# Patient Record
Sex: Female | Born: 1950
Health system: Southern US, Community
[De-identification: ages and names within clinical notes are randomized; demographics above are authoritative.]

## PROBLEM LIST (undated history)

## (undated) ENCOUNTER — Emergency Department (HOSPITAL_COMMUNITY): Disposition: A | Discharge status: Home / Self Care | Payer: Federal, State, Local not specified - PPO

## (undated) DIAGNOSIS — F32A Depression, unspecified: Secondary | ICD-10-CM

## (undated) DIAGNOSIS — F419 Anxiety disorder, unspecified: Secondary | ICD-10-CM

## (undated) DIAGNOSIS — M1990 Unspecified osteoarthritis, unspecified site: Secondary | ICD-10-CM

## (undated) DIAGNOSIS — N879 Dysplasia of cervix uteri, unspecified: Secondary | ICD-10-CM

## (undated) DIAGNOSIS — G9340 Encephalopathy, unspecified: Secondary | ICD-10-CM

## (undated) DIAGNOSIS — F329 Major depressive disorder, single episode, unspecified: Secondary | ICD-10-CM

## (undated) DIAGNOSIS — G35 Multiple sclerosis: Secondary | ICD-10-CM

## (undated) DIAGNOSIS — H3530 Unspecified macular degeneration: Secondary | ICD-10-CM

## (undated) DIAGNOSIS — E7800 Pure hypercholesterolemia, unspecified: Secondary | ICD-10-CM

## (undated) DIAGNOSIS — D649 Anemia, unspecified: Secondary | ICD-10-CM

## (undated) DIAGNOSIS — I10 Essential (primary) hypertension: Secondary | ICD-10-CM

## (undated) HISTORY — PX: CHOLECYSTECTOMY: SHX55

## (undated) HISTORY — DX: Depression, unspecified: F32.A

## (undated) HISTORY — DX: Anxiety disorder, unspecified: F41.9

## (undated) HISTORY — PX: TONSILLECTOMY: SUR1361

## (undated) HISTORY — DX: Essential (primary) hypertension: I10

## (undated) HISTORY — DX: Pure hypercholesterolemia, unspecified: E78.00

## (undated) HISTORY — DX: Major depressive disorder, single episode, unspecified: F32.9

## (undated) HISTORY — DX: Unspecified osteoarthritis, unspecified site: M19.90

## (undated) HISTORY — DX: Unspecified macular degeneration: H35.30

## (undated) HISTORY — PX: TUBAL LIGATION: SHX77

## (undated) HISTORY — PX: COLONOSCOPY W/ POLYPECTOMY: SHX1380

## (undated) HISTORY — PX: COLPOSCOPY: SHX161

## (undated) HISTORY — PX: KNEE SURGERY: SHX244

## (undated) HISTORY — DX: Encephalopathy, unspecified: G93.40

## (undated) HISTORY — DX: Dysplasia of cervix uteri, unspecified: N87.9

---

## 1999-10-06 DIAGNOSIS — H3530 Unspecified macular degeneration: Secondary | ICD-10-CM

## 1999-10-06 HISTORY — DX: Unspecified macular degeneration: H35.30

## 2005-11-23 ENCOUNTER — Other Ambulatory Visit: Admission: RE | Admit: 2005-11-23 | Discharge: 2005-11-23 | Payer: Self-pay | Admitting: Obstetrics and Gynecology

## 2006-08-10 ENCOUNTER — Emergency Department (HOSPITAL_COMMUNITY): Admission: EM | Admit: 2006-08-10 | Discharge: 2006-08-10 | Payer: Self-pay | Admitting: Family Medicine

## 2007-04-11 ENCOUNTER — Emergency Department (HOSPITAL_COMMUNITY): Admission: EM | Admit: 2007-04-11 | Discharge: 2007-04-11 | Payer: Self-pay | Admitting: Emergency Medicine

## 2007-09-23 ENCOUNTER — Emergency Department (HOSPITAL_COMMUNITY): Admission: EM | Admit: 2007-09-23 | Discharge: 2007-09-23 | Payer: Self-pay | Admitting: Family Medicine

## 2007-09-25 ENCOUNTER — Emergency Department (HOSPITAL_COMMUNITY): Admission: EM | Admit: 2007-09-25 | Discharge: 2007-09-25 | Payer: Self-pay | Admitting: Emergency Medicine

## 2007-11-02 ENCOUNTER — Other Ambulatory Visit: Admission: RE | Admit: 2007-11-02 | Discharge: 2007-11-02 | Payer: Self-pay | Admitting: Family Medicine

## 2007-11-15 ENCOUNTER — Encounter: Admission: RE | Admit: 2007-11-15 | Discharge: 2007-11-15 | Payer: Self-pay | Admitting: Family Medicine

## 2007-12-02 ENCOUNTER — Ambulatory Visit: Payer: Self-pay | Admitting: Gastroenterology

## 2008-01-04 ENCOUNTER — Ambulatory Visit: Payer: Self-pay | Admitting: Gastroenterology

## 2008-11-27 ENCOUNTER — Encounter: Admission: RE | Admit: 2008-11-27 | Discharge: 2008-11-27 | Payer: Self-pay | Admitting: Internal Medicine

## 2008-11-27 ENCOUNTER — Other Ambulatory Visit: Admission: RE | Admit: 2008-11-27 | Discharge: 2008-11-27 | Payer: Self-pay | Admitting: Internal Medicine

## 2009-01-30 ENCOUNTER — Encounter: Admission: RE | Admit: 2009-01-30 | Discharge: 2009-01-30 | Payer: Self-pay | Admitting: Internal Medicine

## 2010-05-13 ENCOUNTER — Encounter: Admission: RE | Admit: 2010-05-13 | Discharge: 2010-05-13 | Payer: Self-pay | Admitting: Internal Medicine

## 2011-04-21 ENCOUNTER — Emergency Department (HOSPITAL_COMMUNITY): Payer: Federal, State, Local not specified - PPO

## 2011-04-21 ENCOUNTER — Other Ambulatory Visit (INDEPENDENT_AMBULATORY_CARE_PROVIDER_SITE_OTHER): Payer: Self-pay | Admitting: Surgery

## 2011-04-21 ENCOUNTER — Ambulatory Visit (HOSPITAL_COMMUNITY)
Admission: EM | Admit: 2011-04-21 | Discharge: 2011-04-22 | Disposition: A | Discharge status: Home / Self Care | Payer: Federal, State, Local not specified - PPO | Attending: Surgery | Admitting: Surgery

## 2011-04-21 DIAGNOSIS — I10 Essential (primary) hypertension: Secondary | ICD-10-CM | POA: Insufficient documentation

## 2011-04-21 DIAGNOSIS — K8010 Calculus of gallbladder with chronic cholecystitis without obstruction: Secondary | ICD-10-CM

## 2011-04-21 DIAGNOSIS — K8000 Calculus of gallbladder with acute cholecystitis without obstruction: Secondary | ICD-10-CM | POA: Insufficient documentation

## 2011-04-21 DIAGNOSIS — Z01810 Encounter for preprocedural cardiovascular examination: Secondary | ICD-10-CM | POA: Insufficient documentation

## 2011-04-21 DIAGNOSIS — M1990 Unspecified osteoarthritis, unspecified site: Secondary | ICD-10-CM | POA: Insufficient documentation

## 2011-04-21 DIAGNOSIS — Z01812 Encounter for preprocedural laboratory examination: Secondary | ICD-10-CM | POA: Insufficient documentation

## 2011-04-21 DIAGNOSIS — R1011 Right upper quadrant pain: Secondary | ICD-10-CM

## 2011-04-21 DIAGNOSIS — E785 Hyperlipidemia, unspecified: Secondary | ICD-10-CM | POA: Insufficient documentation

## 2011-04-21 DIAGNOSIS — R112 Nausea with vomiting, unspecified: Secondary | ICD-10-CM

## 2011-04-21 LAB — DIFFERENTIAL
Basophils Absolute: 0 10*3/uL (ref 0.0–0.1)
Eosinophils Absolute: 0 10*3/uL (ref 0.0–0.7)
Eosinophils Relative: 0 % (ref 0–5)
Lymphs Abs: 1.3 10*3/uL (ref 0.7–4.0)
Neutrophils Relative %: 78 % — ABNORMAL HIGH (ref 43–77)

## 2011-04-21 LAB — URINALYSIS, ROUTINE W REFLEX MICROSCOPIC
Bilirubin Urine: NEGATIVE
Ketones, ur: 15 mg/dL — AB
Leukocytes, UA: NEGATIVE
Specific Gravity, Urine: 1.022 (ref 1.005–1.030)
Urobilinogen, UA: 0.2 mg/dL (ref 0.0–1.0)
pH: 5.5 (ref 5.0–8.0)

## 2011-04-21 LAB — COMPREHENSIVE METABOLIC PANEL
AST: 14 U/L (ref 0–37)
Alkaline Phosphatase: 77 U/L (ref 39–117)
CO2: 24 mEq/L (ref 19–32)
Chloride: 102 mEq/L (ref 96–112)
Creatinine, Ser: 0.77 mg/dL (ref 0.50–1.10)
GFR calc non Af Amer: >60 mL/min (ref >60)
Glucose, Bld: 115 mg/dL — ABNORMAL HIGH (ref 70–99)
Potassium: 3.8 mEq/L (ref 3.5–5.1)
Sodium: 138 mEq/L (ref 135–145)

## 2011-04-21 LAB — CBC
Hemoglobin: 14 g/dL (ref 12.0–15.0)
MCH: 29.9 pg (ref 26.0–34.0)
MCHC: 35.4 g/dL (ref 30.0–36.0)
MCV: 84.2 fL (ref 78.0–100.0)
Platelets: 229 10*3/uL (ref 150–400)
RDW: 13.7 % (ref 11.5–15.5)
WBC: 7.6 10*3/uL (ref 4.0–10.5)

## 2011-04-21 LAB — TROPONIN I: Troponin I: <0.3 ng/mL (ref <0.30)

## 2011-05-04 NOTE — Op Note (Signed)
NAMEMARLOW, Wanda [name]                ACCOUNT NO.:  [id number]  MEDICAL RECORD NO.:  [id number]  LOCATION:  5159                         FACILITY:  [facility]  PHYSICIAN:  Sandria Bales. Ezzard Standing, M.D.  DATE OF BIRTH:  08/14/1951  DATE OF PROCEDURE:  04/21/2011                              OPERATIVE REPORT   PREOPERATIVE DIAGNOSES: 1. Cholecystitis. 2. Cholelithiasis.  POSTOPERATIVE DIAGNOSES: 1. Acute on chronic cholecystitis. 2. Cholelithiasis (gallbladder packed full of stones).  PROCEDURE:  Laparoscopic cholecystectomy with intraoperative cholangiogram.  SURGEON:  Sandria Bales. Ezzard Standing, MD  FIRST ASSISTANT:  Anselm Pancoast. Zachery Dakins, MD  ANESTHESIA:  General endotracheal.  ESTIMATED BLOOD LOSS:  Minimal.  INDICATION FOR PROCEDURE:  Wanda [name] is a 60 year old African American female who sees Wanda [name] as her primary medical doctor.  She has had 3 bouts of abdominal pain, presented to the [facility] with right upper quadrant abdominal pain and ultrasound suggestive of a gallbladder disease with cholecystitis.  I discussed with the patient about proceeding with gallbladder surgery today.  I discussed the indications, the potential risk of gallbladder surgery.  The potential risks of gallbladder surgery include, but are not limited to, bleeding, infection, common bile duct injury, and open surgery.  OPERATIVE NOTE:  The patient was placed in a supine position.  She was given 1 g of Ancef at this procedure.  Her abdomen was prepped with ChloraPrep and sterilely draped.  A time-out was held and the surgical checklist was run.  I went through an infraumbilical incision with sharp dissection, carried down to the abdominal cavity.  A 0-degree 10-mm laparoscope was inserted through the Hasson trocar and the Hasson trocar was secured with a 2-0 Vicryl suture.  Right and left lobes of the liver were unremarkable. She did have some Fitz-Hugh and Curtis adhesive bands between the  right lobe of the liver and the diaphragm, it is sort of moderate in density. Her right and left lobes of the liver unremarkable.  Stomach was unremarkable.  The gallbladder was encased in sort of omentum and packed full of gallstones with sort of a subacute inflammation.  I placed 3 additional trocars, a 10-mm subxiphoid trocar, 5-mm right mid subcostal, and 5-mm lateral subcostal trocar.  I was able to grab the gallbladder, it is hard because there is packs of full stones and rotated cephalad.  I then took down the adhesions on the anterior surface of the gallbladder, got down to the cystic duct, gallbladder junction, and the triangle of Calot identified at the cystic artery which I doubly Endoclipped.  I then encircled the cystic duct and shot and intraoperative cholangiogram.  A clip was placed at the gallbladder side of the cystic duct.  The Taut catheter was introduced in to the abdominal cavity with a 16-gauge Jelco.  I made incision at the side of the cystic duct, I milked back, and packed the cystic duct stone, placed the Taut catheter into the cystic duct and secured with an Endoclip.  This showed free flow of contrast.  I then under fluoroscopy shot a cholangiogram that showed free flow of contrast down the cystic duct which is about a centimeter half a length  into the common bile duct up to hepatic radicals and into the duodenum.  This was felt to be a normal intraoperative cholangiogram.  I placed a gallbladder in the EndoCatch bag, delivered through the umbilicus.  I then irrigated the abdomen with a liter and a half of saline.  I closed the umbilical port with 2-0 Vicryl suture, the skin in each port with 5-0 Monocryl suture, infiltrate about 22 mL of local anesthetic into the wound before getting out.  I closed the skin with a 5-0 Monocryl suture, painted the wound with Dermabond.  The patient tolerated the procedure well, was transported to recovery room in good  condition.  Sponge and needle count were correct at the end of the case.   Sandria Bales. Ezzard Standing, M.D., FACS   DHN/MEDQ  D:  04/21/2011  T:  04/22/2011  Job:  [id number]  cc:   Wanda [name]  Electronically Signed by Ovidio Kin M.D. on 05/04/2011 11:41:11 AM

## 2011-05-05 ENCOUNTER — Encounter (INDEPENDENT_AMBULATORY_CARE_PROVIDER_SITE_OTHER): Payer: Federal, State, Local not specified - PPO

## 2011-05-12 NOTE — H&P (Signed)
[name], [name]                ACCOUNT NO.:  [id number]  MEDICAL RECORD NO.:  [id number]  LOCATION:  5159                         FACILITY:  [facility]  PHYSICIAN:  Sandria Bales. Ezzard Standing, M.D.  DATE OF BIRTH:  [birth year]  DATE OF ADMISSION:  04/21/2011                             HISTORY & PHYSICAL   PRIMARY CARE PHYSICIAN:  Quitman Livings, MD  CHIEF COMPLAINT:  Abdominal pain.  HISTORY OF PRESENT ILLNESS:  Wanda [name] is a pleasant 60 year old female who presented with right upper quadrant pain that began late last evening.  She had some spinach dip, states that she does not normally eats that way, but shortly developed some right upper quadrant pain and nausea and vomiting late last evening.  She did not have any fever, reports her bowels have been moving normally up until last night.  She actually reports that over the last month or two, she has had intermittent bouts of at least types of abdominal pain not knowing where it was coming from.  She has tried over-the-counter remedies for reflux and for constipation/diarrhea but has really has not been able to figure out where these bouts have been coming from.    She has no known gallbladder history and has never been told of gallbladder disease in the past.  She presented to the emergency department earlier this morning where ultrasound showed the patient had gallstones with a thick- walled gallbladder and trace amount of fluid.  We were asked to evaluate the patient for surgical intervention.  PAST MEDICAL HISTORY:  Consistent with: 1. Hypertension. 2. Hyperlipidemia. 3. Osteoarthritis.  PAST SURGICAL HISTORY:  C-section x1.  FAMILY HISTORY:  Noncontributory to the present case.  SOCIAL HISTORY:  The patient is retired.  She lives at home with her daughter.  She smokes about half pack of cigarettes a day and has occasional alcoholic beverage.  ALLERGIES:  No known drug or latex allergies.  MEDICATIONS:  Premarin,  hydrochlorothiazide, meloxicam, lovastatin, and glucosamine and chondroitin.  REVIEW OF SYSTEMS:  Please see history of present illness for pertinent findings, otherwise complete 12-system review found negative.  PHYSICAL EXAMINATION:   GENERAL:  A 60 year old female currently is in no acute distress. VITAL SIGNS:  Temperature 98.1, heart rate of 67, blood pressure 156/79, respiratory rate of 18, and oxygen saturation 100% on room air. ENT:  Unremarkable. NECK:  Supple without lymphadenopathy.  Trachea is midline.  No thyromegaly or masses. LUNGS:  Clear to auscultation.  No wheezes, rhonchi, or rales.  Normal respiratory effort without use of accessory muscles. HEART:  Regular rate and rhythm without murmurs, gallops, or rubs. Carotids are 2+ and brisk without bruits.  Peripheral pulses intact and symmetrical. ABDOMEN:  Soft and nondistended.  She is tender in the right upper quadrant without evidence of peritonitis.  No organomegaly or hernias are appreciated.  Lower abdominal C-section scar is present. RECTAL:  Deferred. EXTREMITIES:  Good active range of motion in all extremities without crepitus or pain.  Normal muscle strength and tone without atrophy. SKIN:  Otherwise warm and dry with good turgor.  No rashes, lesions, or nodules.  No jaundice. NEUROLOGIC:  The patient is alert and oriented x3.  Cranial nerves II through XII grossly intact without deficit.  DIAGNOSTICS:  CBC today shows a white blood cell count of 7.6, hemoglobin of 14.0, hematocrit of 35.5, and platelet count of 229. Metabolic panel shows a sodium of 138, potassium 3.8, chloride of 102, CO2 of 24, BUN of 15, creatinine of 0.7, and glucose of 115.  Liver enzymes including lipase within normal limits.    Ultrasound shows thickened gallbladder wall with trace pericholecystic fluid. Gallbladder was essentially packed with gallstones.  Common bile duct was noted to be within normal limits with regards to  size.  IMPRESSION: 1. Acute cholecystitis. 2. Controlled hypertension. 3. Controlled hyperlipidemia.  PLAN:  We will admit the patient.  I have discussed the need for cholecystectomy.  I have also discussed the procedure of laparoscopic versus open cholecystectomy including risks, complications, and postoperative expectations.  She understands and will consent.  I have discussed this case with her daughter in addition to herself.    Dr. Ezzard Standing has seen this patient with me and agrees with current treatment plan.   Brayton El, PA-C  Sandria Bales. Ezzard Standing, M.D., FACS   KB/MEDQ  D:  04/21/2011  T:  04/22/2011  Job:  [id number]  Electronically Signed by Brayton El  on 05/11/2011 03:00:00 PM Electronically Signed by Ovidio Kin M.D. on 05/12/2011 09:23:16 AM

## 2011-09-09 ENCOUNTER — Other Ambulatory Visit: Payer: Self-pay | Admitting: Internal Medicine

## 2011-09-09 DIAGNOSIS — Z1231 Encounter for screening mammogram for malignant neoplasm of breast: Secondary | ICD-10-CM

## 2011-09-24 ENCOUNTER — Ambulatory Visit
Admission: RE | Admit: 2011-09-24 | Discharge: 2011-09-24 | Disposition: A | Discharge status: Home / Self Care | Payer: Federal, State, Local not specified - PPO | Source: Ambulatory Visit | Attending: Internal Medicine | Admitting: Internal Medicine

## 2011-09-24 DIAGNOSIS — Z1231 Encounter for screening mammogram for malignant neoplasm of breast: Secondary | ICD-10-CM

## 2011-12-29 ENCOUNTER — Ambulatory Visit (INDEPENDENT_AMBULATORY_CARE_PROVIDER_SITE_OTHER): Payer: Federal, State, Local not specified - PPO | Admitting: Obstetrics and Gynecology

## 2011-12-29 ENCOUNTER — Other Ambulatory Visit (HOSPITAL_COMMUNITY)
Admission: RE | Admit: 2011-12-29 | Discharge: 2011-12-29 | Disposition: A | Discharge status: Home / Self Care | Payer: Federal, State, Local not specified - PPO | Source: Ambulatory Visit | Attending: Obstetrics and Gynecology | Admitting: Obstetrics and Gynecology

## 2011-12-29 ENCOUNTER — Encounter: Payer: Self-pay | Admitting: Obstetrics and Gynecology

## 2011-12-29 VITALS — BP 124/78 | Ht 60.0 in | Wt 148.0 lb

## 2011-12-29 DIAGNOSIS — M1990 Unspecified osteoarthritis, unspecified site: Secondary | ICD-10-CM | POA: Insufficient documentation

## 2011-12-29 DIAGNOSIS — N950 Postmenopausal bleeding: Secondary | ICD-10-CM

## 2011-12-29 DIAGNOSIS — N879 Dysplasia of cervix uteri, unspecified: Secondary | ICD-10-CM | POA: Insufficient documentation

## 2011-12-29 DIAGNOSIS — E7800 Pure hypercholesterolemia, unspecified: Secondary | ICD-10-CM | POA: Insufficient documentation

## 2011-12-29 DIAGNOSIS — Z01419 Encounter for gynecological examination (general) (routine) without abnormal findings: Secondary | ICD-10-CM

## 2011-12-29 DIAGNOSIS — I10 Essential (primary) hypertension: Secondary | ICD-10-CM | POA: Insufficient documentation

## 2011-12-29 DIAGNOSIS — N871 Moderate cervical dysplasia: Secondary | ICD-10-CM

## 2011-12-29 NOTE — Progress Notes (Addendum)
Patient is a 61 year old gravida 6 para 3 AB 3 who came to see me today as a new patient for a problem visit. She actually previously been in our office in 2007 when she moved from [place]. She been on hormone replacement therapy there. When she moved here we continued it. She was on Premarin 0.45 mg but she claims she never took progesterone. I do not have her records from 2007 but I will get it. She recently has been getting her HRT from her internist. He tried  to order her Premarin but they did not have it at the drug store. He switched her to estradiol 1 mg daily but without any progesterone. She has now had 2 periods since she switched. She is currently having 1 now. They seem to be regular cycles rather than persistent bleeding. They are associated with cramping. She is also having intermittent left lower quadrant pain. She has a history of fibroids. She has a history of cervical dysplasia and is due for a Pap smear. She is up-to-date on her mammograms. She does her lab with her PCP. She feels the need to continue HRT due to symptoms.  Physical examination: Kennon Portela present. HEENT within normal limits. Neck: Thyroid not large. No masses. Supraclavicular nodes: not enlarged. Breasts: Examined in both sitting and lying  position. No skin changes and no masses. Abdomen: Soft no guarding rebound or masses or hernia. Pelvic: External: Within normal limits. BUS: Within normal limits. Vaginal:within normal limits. Good estrogen effect. No evidence of cystocele rectocele or enterocele. Cervix: clean. Uterus: enlarged by fibroids to approximately 10 weeks size . Adnexa: No masses. Rectovaginal exam: Confirmatory and negative.  Assessment: Postmenopausal bleeding on unopposed estrogen. Pelvic pain. Fibroids. Cervical dysplasia.  Plan: Endometrial biopsy done. We will get her old records. Ultrasound scheduled. We will add progestin on the day of the ultrasound. We may also lower her estradiol to  half a milligram. Extremities: Within normal limits.  Paper chart was retrieved from off-site. In reviewing the records patient had a Pap smear in February 2007 showing CIN-1. Patient underwent colposcopy with biopsy that month and no dysplasia was seen. She had had  abnormal Paps previous to this in  [place]. In reviewing those there was significant discrepancy from what we did in 2007 here. As a result of this we proceeded with LEEP which showed cervicitis with squamous metaplasia and reactive squamous epithelial changes without dysplasia. Patient never returned after this for followup Pap smears. In regard to her HRT we gave her samples of femhrt  0.5 mg and she never asked for other treatment after the samples. Please note Pap smears from [place] showed high risk HPV.

## 2011-12-29 NOTE — Patient Instructions (Signed)
Schedule ultrasound

## 2012-01-07 ENCOUNTER — Ambulatory Visit (INDEPENDENT_AMBULATORY_CARE_PROVIDER_SITE_OTHER): Payer: Federal, State, Local not specified - PPO

## 2012-01-07 ENCOUNTER — Other Ambulatory Visit: Payer: Self-pay | Admitting: Obstetrics and Gynecology

## 2012-01-07 ENCOUNTER — Ambulatory Visit (INDEPENDENT_AMBULATORY_CARE_PROVIDER_SITE_OTHER): Payer: Federal, State, Local not specified - PPO | Admitting: Obstetrics and Gynecology

## 2012-01-07 DIAGNOSIS — N852 Hypertrophy of uterus: Secondary | ICD-10-CM

## 2012-01-07 DIAGNOSIS — N950 Postmenopausal bleeding: Secondary | ICD-10-CM

## 2012-01-07 DIAGNOSIS — D259 Leiomyoma of uterus, unspecified: Secondary | ICD-10-CM

## 2012-01-07 DIAGNOSIS — D252 Subserosal leiomyoma of uterus: Secondary | ICD-10-CM

## 2012-01-07 DIAGNOSIS — D219 Benign neoplasm of connective and other soft tissue, unspecified: Secondary | ICD-10-CM

## 2012-01-07 DIAGNOSIS — N83339 Acquired atrophy of ovary and fallopian tube, unspecified side: Secondary | ICD-10-CM

## 2012-01-07 DIAGNOSIS — N7013 Chronic salpingitis and oophoritis: Secondary | ICD-10-CM

## 2012-01-07 MED ORDER — ZOLPIDEM TARTRATE 10 MG PO TABS: 10.0000 mg | ORAL_TABLET | Freq: Every evening | ORAL | Status: DC | PRN

## 2012-01-07 MED ORDER — ESTRADIOL 0.5 MG PO TABS: 0.5000 mg | ORAL_TABLET | Freq: Every day | ORAL | Status: DC

## 2012-01-07 MED ORDER — MEDROXYPROGESTERONE ACETATE 5 MG PO TABS: 5.0000 mg | ORAL_TABLET | Freq: Every day | ORAL | Status: DC

## 2012-01-07 MED ORDER — MEDROXYPROGESTERONE ACETATE 2.5 MG PO TABS: 2.5000 mg | ORAL_TABLET | Freq: Every day | ORAL | Status: DC

## 2012-01-07 NOTE — Progress Notes (Signed)
The patient came back today for ultrasound due to postmenopausal bleeding. When we saw her several weeks ago we did an endometrial biopsy which was benign. She told me then that I had treated her previously with unopposed estrogen. We got her old chart out of storage and although we give her single pill it was Femhrt 0.5 mg not Premarin as  she had told me. When later she was getting HRT from her PCP he was given her unopposed estrogen. The other issue with her was that we have done a LEEP on her in 2007 because of abnormal Paps in [place] with high risk HPV detected. LEEP in our office failed to reveal dysplasia and she never returned for followup Pap. She told  me today that she had had two normal Paps elsewhere after the LEEP. When we saw her several weeks ago we did a Pap also which was normal. She is having a lot of trouble sleeping and Ambien worked well for her.  On ultrasound today her uterus was enlarged by multiple fibroids. The endometrial echo was thick at 9.8 mm but she has been on unopposed estrogen. Her right ovary was normal. Her left ovary was normal and atrophic. She did have a hydrosalpinx on the left of approximately 2 cm. She has had a previous tubal ligation. Her cul-de-sac was free of fluid.  Assessment: #1. Menopausal symptoms #2. Postmenopausal bleeding #3. HPV with abnormal Pap smears now apparently resolved after LEEP #4. Left hydrosalpinx #5. Sleep disturbance  Plan: I explained to her the difference between unimposed estrogen and a combination estrogen and progesterone pill. She has been off her estrogen for 5 days now. She misses it and would like to go back on. We rediscussed again the higher risk of breast cancer. She feels benefits outweigh the risks. We started her on estradiol half a milligram daily and medroxyprogesterone 2.5 mg daily. She will report abnormal bleeding to me. She will get me her other Pap smears. She will continue yearly mammograms. We also gave her  prescription for Ambien 10 mg. #30 with 2 refills. We will ultrasound her in 4 months for stability of her hydrosalpinx.

## 2012-01-07 NOTE — Progress Notes (Signed)
Addended by: Dayna Barker on: 01/07/2012 04:08 PM   Modules accepted: Orders

## 2012-04-15 ENCOUNTER — Telehealth: Payer: Self-pay | Admitting: *Deleted

## 2012-04-15 MED ORDER — ZOLPIDEM TARTRATE 10 MG PO TABS: 10.0000 mg | ORAL_TABLET | Freq: Every evening | ORAL | Status: DC | PRN

## 2012-04-15 NOTE — Telephone Encounter (Signed)
RECEIVED PRIOR AUTHORIZATION FROM [employer]. [employer] CALLED [phone]. SPOKE WITH DEE AND SHE APPROVED RX #30 FROM 03-17-12 TO 03-17-13. NOTIFIED CHEVELLE AT HER [facility]([phone]) TO RUN RX AGAIN SINCE IT WAS APPROVED.

## 2012-04-15 NOTE — Telephone Encounter (Signed)
Pt called requesting refill on Ambien 10 mg. rx called in  0 refills sent to pharmacy.

## 2012-06-08 ENCOUNTER — Other Ambulatory Visit: Payer: Self-pay | Admitting: *Deleted

## 2012-06-09 MED ORDER — ZOLPIDEM TARTRATE 10 MG PO TABS: 10.0000 mg | ORAL_TABLET | Freq: Every evening | ORAL | Status: DC | PRN

## 2012-07-26 ENCOUNTER — Telehealth: Payer: Self-pay | Admitting: *Deleted

## 2012-07-26 MED ORDER — ZOLPIDEM TARTRATE 10 MG PO TABS: 10.0000 mg | ORAL_TABLET | Freq: Every evening | ORAL | Status: DC | PRN

## 2012-07-26 NOTE — Telephone Encounter (Signed)
rx called in. Pt informed.

## 2012-07-26 NOTE — Addendum Note (Signed)
Addended by: Aura Camps on: 07/26/2012 12:21 PM   Modules accepted: Orders

## 2012-07-26 NOTE — Telephone Encounter (Signed)
Given #30. She can have 3 refills.

## 2012-07-26 NOTE — Telephone Encounter (Signed)
Pt called request refill on Ambien 10 mg tablet to help with sleep. Okay to fill? Refills?

## 2012-09-05 ENCOUNTER — Telehealth: Payer: Self-pay | Admitting: Obstetrics and Gynecology

## 2012-09-05 NOTE — Telephone Encounter (Signed)
Patient called complaining of PMB again like back in April.  Patient knows she needs office visit but was under the impression that you were already retired and that she would need to see someone and wondered who she should see.  She was calling for recommendation.  Just wanted to see what type office visit I should schedule her for with you (ie. U/S, SHGM or RG visit,etc.),

## 2012-09-05 NOTE — Telephone Encounter (Signed)
Wanda [name] will call and schedule patient.

## 2012-09-05 NOTE — Telephone Encounter (Signed)
shgm

## 2012-09-06 ENCOUNTER — Ambulatory Visit: Payer: Federal, State, Local not specified - PPO | Admitting: Gynecology

## 2012-09-07 ENCOUNTER — Other Ambulatory Visit: Payer: Self-pay | Admitting: Obstetrics and Gynecology

## 2012-09-07 DIAGNOSIS — N950 Postmenopausal bleeding: Secondary | ICD-10-CM

## 2012-09-12 ENCOUNTER — Ambulatory Visit (INDEPENDENT_AMBULATORY_CARE_PROVIDER_SITE_OTHER): Payer: Federal, State, Local not specified - PPO

## 2012-09-12 ENCOUNTER — Ambulatory Visit (INDEPENDENT_AMBULATORY_CARE_PROVIDER_SITE_OTHER): Payer: Federal, State, Local not specified - PPO | Admitting: Obstetrics and Gynecology

## 2012-09-12 DIAGNOSIS — N83339 Acquired atrophy of ovary and fallopian tube, unspecified side: Secondary | ICD-10-CM

## 2012-09-12 DIAGNOSIS — D219 Benign neoplasm of connective and other soft tissue, unspecified: Secondary | ICD-10-CM

## 2012-09-12 DIAGNOSIS — N840 Polyp of corpus uteri: Secondary | ICD-10-CM

## 2012-09-12 DIAGNOSIS — D252 Subserosal leiomyoma of uterus: Secondary | ICD-10-CM

## 2012-09-12 DIAGNOSIS — D259 Leiomyoma of uterus, unspecified: Secondary | ICD-10-CM

## 2012-09-12 DIAGNOSIS — N950 Postmenopausal bleeding: Secondary | ICD-10-CM

## 2012-09-12 DIAGNOSIS — N852 Hypertrophy of uterus: Secondary | ICD-10-CM

## 2012-09-12 MED ORDER — ACETAMINOPHEN-CODEINE #3 300-30 MG PO TABS: 1.0000 | ORAL_TABLET | ORAL | Status: DC | PRN

## 2012-09-12 NOTE — Patient Instructions (Addendum)
Call in one week and report on bleeding and pelvic cramping.

## 2012-09-12 NOTE — Progress Notes (Signed)
Patient came back today because of one-month history of vaginal bleeding and cramping on her HRT. She takes continuous progestin. Earlier in the year I saw her her when she was only taking estrogen. Biopsy and ultrasound were fine. When she started the progesterone she had no problems until a  month ago. She actually has been free of the bleeding for the last week. She has however continued to cramp. She is under a lot of stress due to a  Move. The cramping is significant enough that she cannot sleep. On ultrasound today her uterus continues to be enlarged by multiple fibroids with the largest 4.5 cm. Her right ovary is normal. Her left ovary is normal. The hydrosalpinx which we  previously saw  is not visible today. Her cul-de-sac is free of fluid. A catheter was placed in her uterus and saline was injected. There is a subtle 6 x 4 mm posterior wall defect which certainly  could be a small polyp. An endometrial biopsy was done.  Assessment: Postmenopausal bleeding. Fibroids. Pelvic cramping. Possible small endometrial polyp.  Plan: Patient says she cannot do without the hormones because she is so symptomatic.Consider estrogen-serm when available next year She will take a 1 week break from estrogen-progesterone and see if the cramping disappears. If not we will have her see her gastroenterologist. If bleeding persists I think we should remove this very small polyp. If bleeding disappears and she is doing well we will continue as above. She will let us know in a week. Tylenol No. 3 prescribed for cramping.

## 2012-09-14 DIAGNOSIS — N83339 Acquired atrophy of ovary and fallopian tube, unspecified side: Secondary | ICD-10-CM

## 2012-09-14 DIAGNOSIS — N950 Postmenopausal bleeding: Secondary | ICD-10-CM

## 2012-12-07 ENCOUNTER — Other Ambulatory Visit: Payer: Self-pay | Admitting: Internal Medicine

## 2012-12-07 ENCOUNTER — Other Ambulatory Visit: Payer: Self-pay

## 2012-12-29 ENCOUNTER — Other Ambulatory Visit (HOSPITAL_COMMUNITY)
Admission: RE | Admit: 2012-12-29 | Discharge: 2012-12-29 | Disposition: A | Discharge status: Home / Self Care | Payer: Federal, State, Local not specified - PPO | Source: Ambulatory Visit | Attending: Obstetrics and Gynecology | Admitting: Obstetrics and Gynecology

## 2012-12-29 ENCOUNTER — Ambulatory Visit (INDEPENDENT_AMBULATORY_CARE_PROVIDER_SITE_OTHER): Payer: Self-pay | Admitting: Women's Health

## 2012-12-29 ENCOUNTER — Encounter: Payer: Self-pay | Admitting: Women's Health

## 2012-12-29 VITALS — BP 148/94 | Ht 61.25 in | Wt 148.0 lb

## 2012-12-29 DIAGNOSIS — Z01419 Encounter for gynecological examination (general) (routine) without abnormal findings: Secondary | ICD-10-CM | POA: Insufficient documentation

## 2012-12-29 DIAGNOSIS — G4700 Insomnia, unspecified: Secondary | ICD-10-CM

## 2012-12-29 DIAGNOSIS — Z79890 Hormone replacement therapy (postmenopausal): Secondary | ICD-10-CM

## 2012-12-29 DIAGNOSIS — I10 Essential (primary) hypertension: Secondary | ICD-10-CM

## 2012-12-29 DIAGNOSIS — D219 Benign neoplasm of connective and other soft tissue, unspecified: Secondary | ICD-10-CM | POA: Insufficient documentation

## 2012-12-29 DIAGNOSIS — Z13220 Encounter for screening for lipoid disorders: Secondary | ICD-10-CM

## 2012-12-29 DIAGNOSIS — D259 Leiomyoma of uterus, unspecified: Secondary | ICD-10-CM

## 2012-12-29 LAB — COMPREHENSIVE METABOLIC PANEL
ALT: 21 U/L (ref 0–35)
AST: 17 U/L (ref 0–37)
Albumin: 4.6 g/dL (ref 3.5–5.2)
Alkaline Phosphatase: 87 U/L (ref 39–117)
Glucose, Bld: 94 mg/dL (ref 70–99)
Potassium: 4.3 mEq/L (ref 3.5–5.3)
Sodium: 141 mEq/L (ref 135–145)
Total Protein: 8 g/dL (ref 6.0–8.3)

## 2012-12-29 LAB — CBC WITH DIFFERENTIAL/PLATELET
Basophils Relative: 1 % (ref 0–1)
Hemoglobin: 14.2 g/dL (ref 12.0–15.0)
MCHC: 33.8 g/dL (ref 30.0–36.0)
Monocytes Relative: 9 % (ref 3–12)
Neutro Abs: 3 10*3/uL (ref 1.7–7.7)
Neutrophils Relative %: 57 % (ref 43–77)
Platelets: 242 10*3/uL (ref 150–400)
RBC: 4.86 MIL/uL (ref 3.87–5.11)

## 2012-12-29 LAB — LIPID PANEL
LDL Cholesterol: 157 mg/dL — ABNORMAL HIGH (ref 0–99)
Triglycerides: 100 mg/dL (ref <150)

## 2012-12-29 MED ORDER — ZOLPIDEM TARTRATE 10 MG PO TABS: 10.0000 mg | ORAL_TABLET | Freq: Every evening | ORAL | Status: AC | PRN

## 2012-12-29 MED ORDER — ESTRADIOL 0.5 MG PO TABS: 0.5000 mg | ORAL_TABLET | Freq: Every day | ORAL | Status: DC

## 2012-12-29 MED ORDER — HYDROCHLOROTHIAZIDE 25 MG PO TABS: 25.0000 mg | ORAL_TABLET | Freq: Every day | ORAL | Status: DC

## 2012-12-29 MED ORDER — MEDROXYPROGESTERONE ACETATE 2.5 MG PO TABS: 2.5000 mg | ORAL_TABLET | Freq: Every day | ORAL | Status: DC

## 2012-12-29 NOTE — Progress Notes (Signed)
Wanda [name] [birth year] [id number]    History:    The patient presents for annual exam.  Postmenopausal on HRT. Had postmenopausal bleeding 01/2012 with a negative endometrial biopsy. Had a sonohysterogram 09/2012 with a 6 x 4 mm posterior wall defect, numerous subserous and intramural fibroids.. Endometrium 4.8 mm. Several years ago took estrogen only prescribed by primary care. Negative colonoscopy 2012. DEXA 2009 T score 1.9 spine 0.3 left hip. History of CIN-1 with positive HR HPV, LEEP procedure that showed no dysplasia in 08,  normal Paps following. Many year history of insomnia takes Ambien 10 mg daily. Hypertension on HCTZ, currently out. Biggest complaint today is knee pain, affecting quality of life, 9 siblings and mother all have knee or hip problems.   Past medical history, past surgical history, family history and social history were all reviewed and documented in the EPIC chart. Moved here from [place], daughter lives here. Son 40. No grandchildren. Has 2 dogs. Mother history of colon cancer, diabetes, hypertension.  ROS:  A  ROS was performed and pertinent positives and negatives are included in the history.  Exam:  Filed Vitals:   12/29/12 1223  BP: 148/94    General appearance:  Normal Head/Neck:  Normal, without cervical or supraclavicular adenopathy. Thyroid:  Symmetrical, normal in size, without palpable masses or nodularity. Respiratory  Effort:  Normal  Auscultation:  Clear without wheezing or rhonchi Cardiovascular  Auscultation:  Regular rate, without rubs, murmurs or gallops  Edema/varicosities:  Not grossly evident Abdominal  Soft,nontender, without masses, guarding or rebound.  Liver/spleen:  No organomegaly noted  Hernia:  None appreciated  Skin  Inspection:  Grossly normal  Palpation:  Grossly normal Neurologic/psychiatric  Orientation:  Normal with appropriate conversation.  Mood/affect:  Normal  Genitourinary    Breasts: Examined lying and  sitting.     Right: Without masses, retractions, discharge or axillary adenopathy.     Left: Without masses, retractions, discharge or axillary adenopathy.   Inguinal/mons:  Normal without inguinal adenopathy  External genitalia:  Normal  BUS/Urethra/Skene's glands:  Normal  Bladder:  Normal  Vagina:  Normal  Cervix:  Normal  Uterus:  Retroverted, 10-12 weeks size /fibroids  Adnexa/parametria:     Rt: Without masses or tenderness.   Lt: Without masses or tenderness.  Anus and perineum: Normal  Digital rectal exam: Normal sphincter tone without palpated masses or tenderness  Assessment/Plan:  62 y.o. WBF G3P2 for annual exam.   LEEP 08, negative biopsy normal Paps following. Normal DEXA 2009 Postmenopausal bleeding with negative endometrial biopsy 4 and 09/2012 Knee pain left greater than right Fibroids Chronic insomnia-Ambien 10 mg at bedtime Smoker HRT  Plan: Repeat DEXA, will schedule. SBE's, continue annual mammogram, scheduled next week will keep appointment. Reviewed getting second opinion for chronic knee pain. HCTZ 25 mg prescription given #30 reviewed will not continue filling, will need to find a primary care to manage hypertension. CBC, metabolic panel, lipid panel, UA, Pap, will mail copy of labs to take to primary care appointments. HRT discuss, risk for blood clots, strokes, breast cancer reviewed states cannot tolerate being off does not feel well. Estradiol 0.5 mg daily and Provera 2.5 by mouth daily prescriptions given and reviewed importance of taking both. Instructed to call if any further bleeding. Ambien 10 mg by mouth at bedtime when necessary prescription, proper use given and reviewed. Reviewed passes of smoking in relationship to heart and bone health. Chantix prescription, proper use, coupon and instruction booklet given. Reviewed side effects  of nausea and strange dreams reviewed.      Harrington Challenger WHNP, 1:06 PM 12/29/2012

## 2012-12-29 NOTE — Addendum Note (Signed)
Addended by: Richardson Chiquito on: 12/29/2012 02:54 PM   Modules accepted: Orders

## 2012-12-29 NOTE — Patient Instructions (Addendum)
Health Recommendations for Postmenopausal Women zostavac vaccine  shingles   Based on the Results of the Women's Health Initiative [facility]) and Other Studies The WHI is a major 15-year research program to address the most common causes of death, disability and poor quality of life in postmenopausal women. Some of these causes are heart disease, cancer, bone loss (osteoporosis) and others. Taking into account all of the findings from [facility] and other studies, here are bottom-line health recommendations for women: CARDIOVASCULAR DISEASE Heart Disease: A heart attack is a medical emergency. Know the signs and symptoms of a heart attack. Hormone therapy should not be used to prevent heart disease. In women with heart disease, hormone therapy should not be used to prevent further disease. Hormone therapy increases the risk of blood clots. Below are things women can do to reduce their risk for heart disease.   Do not smoke. If you smoke, quit. Women who smoke are 2 to 6 times more likely to suffer a heart attack than non-smoking women.  Aim for a healthy weight. Being overweight causes many preventable deaths. Eat a healthy and balanced diet and drink an adequate amount of liquids.  Get moving. Make a commitment to be more physically active. Aim for 30 minutes of activity on most, if not all days of the week.  Eat for heart health. Choose a diet that is low in saturated fat, trans fat, and cholesterol. Include whole grains, vegetables, and fruits. Read the labels on the food container before buying it.  Know your numbers. Ask your caregiver to check your blood pressure, cholesterol (total, HDL, LDL, triglycerides) and blood glucose. Work with your caregiver to improve any numbers that are not normal.  High blood pressure. Limit or stop your table salt intake (try salt substitute and food seasonings), avoid salty foods and drinks. Read the labels on the food container before buying it. Avoid becoming  overweight by eating well and exercising. STROKE  Stroke is a medical emergency. Stroke can be the result of a blood clot in the blood vessel in the brain or by a brain hemorrhage (bleeding). Know the signs and symptoms of a stroke. To lower the risk of developing a stroke:  Avoid fatty foods.  Quit smoking.  Control your diabetes, blood pressure, and irregular heart rate. THROMBOPHLIBITIS (BLOOD CLOT) OF THE LEG  Hormone treatment is a big cause of developing blood clots in the leg. Becoming overweight and leading a stationary lifestyle also may contribute to developing blood clots. Controlling your diet and exercising will help lower the risk of developing blood clots. CANCER SCREENING  Breast Cancer: Women should take steps to reduce their risk of breast cancer. This includes having regular mammograms, monthly self breast exams and regular breast exams by your caregiver. Have a mammogram every one to two years if you are 20 to 62 years old. Have a mammogram annually if you are 19 years old or older depending on your risk factors. Women who are high risk for breast cancer may need more frequent mammograms. There are tests available (testing the genes in your body) if you have family history of breast cancer called BRCA 1 and 2. These tests can help determine the risks of developing breast cancer.  Intestinal or Stomach Cancer: Women should talk to their caregiver about when to start screening, what tests and how often they should be done, and the benefits and risks of doing these tests. Tests to consider are a rectal exam, fecal occult blood, sigmoidoscopy, colononoscoby,  barium enema and upper GI series of the stomach. Depending on the age, you may want to get a medical and family history of colon cancer. Women who are high risk may need to be screened at an earlier age and more often.  Cervical Cancer: A Pap test of the cervix should be done every year and every 3 years when there has been three  straight years of a normal Pap test. Women with an abnormal Pap test should be screened more often or have a cervical biopsy depending on your caregiver's recommendation.  Uterine Cancer: If you have vaginal bleeding after you are in the menopause, it should be evaluated by your caregiver.  Ovarian cancer: There are no reliable tests available to screen for ovarian cancer at this time except for yearly pelvic exams.  Lung Cancer: Yearly chest X-rays can detect lung cancer and should be done on high risk women, such as cigarette smokers and women with chronic lung disease (emphysemia).  Skin Cancer: A complete body skin exam should be done at your yearly examination. Avoid overexposure to the sun and ultraviolet light lamps. Use a strong sun block cream when in the sun. All of these things are important in lowering the risk of skin cancer. MENOPAUSE Menopause Symptoms: Hormone therapy products are effective for treating symptoms associated with menopause:  Moderate to severe hot flashes.  Night sweats.  Mood swings.  Headaches.  Tiredness.  Loss of sex drive.  Insomnia.  Other symptoms. However, hormone therapy products carry serious risks, especially in older women. Women who use or are thinking about using estrogen or estrogen with progestin treatments should discuss that with their caregiver. Your caregiver will know if the benefits outweigh the risks. The Food and Drug Administration (FDA) has concluded that hormone therapy should be used only at the lowest doses and for the shortest amount of time to reach treatment goals. It is not known at what doses there may be less risk of serious side effects. There are other treatments such as herbal medication (not controlled or regulated by the FDA), group therapy, counseling and acupuncture that may be helpful. OSTEOPOROSIS Protecting Against Bone Loss and Preventing Fracture: If hormone therapy is used for prevention of bone loss  (osteoporosis), the risks for bone loss must outweigh the risk of the therapy. Women considering taking hormone therapy for bone loss should ask their health care providers about other medications (fosamax and boniva) that are considered safe and effective for preventing bone loss and bone fractures. To guard against bone loss or fractures, it is recommended that women should take at least 1000-1500 mg of calcium and 400-800 IU of vitamin D daily in divided doses. Smoking and excessive alcohol intake increases the risk of osteoporosis. Eat foods rich in calcium and vitamin D and do weight bearing exercises several times a week as your caregiver suggests. DIABETES Diabetes Melitus: Women with Type I or Type 2 diabetes should keep their diabetes in control with diet, exercise and medication. Avoid too many sweets, starchy and fatty foods. Being overweight can affect your diabetes. COGNITION AND MEMORY Cognition and Memory: Menopausal hormone therapy is not recommended for the prevention of cognitive disorders such as Alzheimer's disease or memory loss. WHI found that women treated with hormone therapy have a greater risk of developing dementia.  DEPRESSION  Depression may occur at any age, but is common in elderly women. The reasons may be because of physical, medical, social (loneliness), financial and/or economic problems and needs. Becoming involved  with church, volunteer or social groups, seeking treatment for any physical or medical problems is recommended. Also, look into getting professional advice for any economic or financial problems. ACCIDENTS  Accidents are common and can be serious in the elderly woman. Prepare your house to prevent accidents. Eliminate throw rugs, use hip protectors, place hand bars in the bath, shower and toilet areas. Avoid wearing high heel shoes and walking on wet, snowy and icy areas. Stop driving if you have vision, hearing problems or are unsteady with you movements and  reflexes. RHEUMATOID ARTHRITIS Rheumatoid arthritis causes pain, swelling and stiffness of your bone joints. It can limit many of your activities. Over-the-counter medications may help, but prescription medications may be necessary. Talk with your caregiver about this. Exercise (walking, water aerobics), good posture, using splints on painful joints, warm baths or applying warm compresses to stiff joints and cold compresses to painful joints may be helpful. Smoking and excessive drinking may worsen the symptoms of arthritis. Seek help from a physical therapist if the arthritis is becoming a problem with your daily activities. IMMUNIZATIONS  Several immunizations are important to have during your senior years, including:   Tetanus and a diptheria shot booster every 10 years.  Influenza every year before the flu season begins.  Pneumonia vaccine.  Shingles vaccine.  Others as indicated (example: H1N1 vaccine). Document Released: 11/13/2005 Document Revised: 12/14/2011 Document Reviewed: 07/09/2008 [facility] Patient Information 2013 [place], [place].

## 2012-12-30 LAB — URINALYSIS W MICROSCOPIC + REFLEX CULTURE
Casts: NONE SEEN
Crystals: NONE SEEN
Leukocytes, UA: NEGATIVE
Nitrite: NEGATIVE
Specific Gravity, Urine: 1.017 (ref 1.005–1.030)
Squamous Epithelial / LPF: NONE SEEN
Urobilinogen, UA: 0.2 mg/dL (ref 0.0–1.0)
pH: 5 (ref 5.0–8.0)

## 2013-01-05 ENCOUNTER — Ambulatory Visit: Payer: Federal, State, Local not specified - PPO

## 2013-01-12 ENCOUNTER — Encounter: Payer: Self-pay | Admitting: Gastroenterology

## 2013-02-14 ENCOUNTER — Ambulatory Visit
Admission: RE | Admit: 2013-02-14 | Discharge: 2013-02-14 | Disposition: A | Discharge status: Home / Self Care | Payer: Federal, State, Local not specified - PPO | Source: Ambulatory Visit

## 2013-02-14 DIAGNOSIS — Z1231 Encounter for screening mammogram for malignant neoplasm of breast: Secondary | ICD-10-CM

## 2013-02-26 ENCOUNTER — Other Ambulatory Visit: Payer: Self-pay | Admitting: Women's Health

## 2013-03-01 ENCOUNTER — Other Ambulatory Visit: Payer: Self-pay | Admitting: Women's Health

## 2013-03-01 ENCOUNTER — Telehealth: Payer: Self-pay

## 2013-03-01 NOTE — Telephone Encounter (Signed)
Okay to fill for one month but needs a primary care to manage hypertension. She could go to [place] urgent care, or [place] [facility].

## 2013-03-01 NOTE — Telephone Encounter (Signed)
I spoke with patient. I explained that Wanda [name] cannot keep refilling her HCTZ. She said she knew she was supposed to go to primary care. She said she had to go home as they had a death in the family and she also had to put money toward the funeral and did not have it to go to [facility].  She said thank you for helping her one more time and she will call today and get in somewhere before this Rx is up.

## 2013-03-23 ENCOUNTER — Ambulatory Visit (INDEPENDENT_AMBULATORY_CARE_PROVIDER_SITE_OTHER): Payer: Federal, State, Local not specified - PPO | Admitting: Physician Assistant

## 2013-03-23 ENCOUNTER — Encounter: Payer: Self-pay | Admitting: Physician Assistant

## 2013-03-23 VITALS — BP 152/93 | HR 74 | Temp 97.5°F | Resp 16 | Ht 59.75 in | Wt 149.6 lb

## 2013-03-23 DIAGNOSIS — M1990 Unspecified osteoarthritis, unspecified site: Secondary | ICD-10-CM

## 2013-03-23 DIAGNOSIS — G4700 Insomnia, unspecified: Secondary | ICD-10-CM | POA: Insufficient documentation

## 2013-03-23 DIAGNOSIS — I10 Essential (primary) hypertension: Secondary | ICD-10-CM

## 2013-03-23 DIAGNOSIS — M129 Arthropathy, unspecified: Secondary | ICD-10-CM

## 2013-03-23 MED ORDER — HYDROCHLOROTHIAZIDE 25 MG PO TABS: ORAL_TABLET | ORAL | Status: DC

## 2013-03-23 MED ORDER — TRAZODONE HCL 50 MG PO TABS: 25.0000 mg | ORAL_TABLET | Freq: Every evening | ORAL | Status: DC | PRN

## 2013-03-23 MED ORDER — ZOLPIDEM TARTRATE 10 MG PO TABS: 10.0000 mg | ORAL_TABLET | Freq: Every evening | ORAL | Status: DC | PRN

## 2013-03-23 MED ORDER — MELOXICAM 15 MG PO TABS: 15.0000 mg | ORAL_TABLET | Freq: Every day | ORAL | Status: DC

## 2013-03-23 NOTE — Patient Instructions (Signed)
If you still need the Ambien after taking the trazodone in the evening, it is OK to take it. We'll re-evaluate your sleeping in 1 month, and we can increase the trazodone dose if needed.

## 2013-03-23 NOTE — Progress Notes (Signed)
Subjective:    Patient ID: Wanda [name], female    DOB: 08-04-1951, 62 y.o.   MRN: [id number]  HPI  Presents at the recommendation of her ob/gyn, Ms. Wanda Rowan, PA-C, to establish primary care. [name] has advised her that she can no longer prescribe the patient's long-term non-GYN medications, and patient is out, needing refills.   Hx of HTN, arthritis, anxiety/depression, and insomnia. Arthritis is worse is LEFT knee and followed by Dr. August Saucer of [facility]. Mammogram last month was reported as normal.   Concern for episodes which she calls "spacing out", in which she loses focus. Describes driving from point A to point B but instead "spaces out" and ends up at a familiar, yet unintended, destination. She may forget what she went in a room to get, and recently lost a list of names and addresses she made to take on an errand, only to find it balled up in the trash can, which she doesn't recall doing. She reports only sleeping approximately 4 hours total each night, though not all at once, and despite knowing she is tired, feels like she is unable to sleep even with ambien.   Pt no longer eats fast food, is making vegetable/fruit juices at home, and has significantly reduce red meat intake in her diet. Obtains exercise walking her dogs but misses being able to dance and swim due to knee pain.   Pt is retired and has 1 daughter in [place] and 1 son who lives out of town. She was 1 of 10 siblings, 5 siblings are alive. Mother -deceased- had diabetes, colon cancer, HTN. Father -deceased- had prostate cancer. Quit smoking April 2014, reduced alcohol intake and reports having 1 glass of wine 2-3 x per week. Pt enjoys reading and writing and her 2 chiwawas.    Review of Systems Denies: headache, SOB, chest pain, abdominal pain, urinary symptoms, swelling in extremities.     Objective:   Physical Exam  BP 152/93  Pulse 74  Temp(Src) 97.5 F (36.4 C) (Oral)  Resp 16  Ht 4' 11.75"  (1.518 m)  Wt 149 lb 9.6 oz (67.858 kg)  BMI 29.45 kg/m2  SpO2 99%  General: WDWN female, appears stated age, NAD Head: normocephalic, atraumatic  Eyes: PERLA, sclera/conjunctiva clear  Ears: TMs clear with visible bony landmarks bilaterally   Nose: turbinates normal, no visible discharge  Throat: moist mucous membranes, uvula midline, no erythema edema or exudate posterior pharynx   Neck: supple, no lymphadenopathy/tenderness, no JVD Resp: clear to auscultation bilaterally, no rales/rhonchi/wheezes Cardiac: RRR, no murmurs/rubs/gallops Abdomen: normal bowel sounds, soft, non distended, non tender to palpation, no rebound, no guarding, small well-healed surgical scar on RUQ from lap CCY Extremities: moves all limbs spontaneously, radial & dorsalis pedis pulses present and even bilaterally, strength 5/5 UE & LE, full ROM, 2+ reflexes brachioradialis bicep patellar and achilles bilaterally  Neuro: alert & oriented x 3, cranial nerves II-XII grossly intact  Skin: no rashes, lesions      Assessment & Plan:   Insomnia - Plan: traZODone (DESYREL) 50 MG tablet, zolpidem (AMBIEN) 10 MG tablet  Arthritis - Plan: meloxicam (MOBIC) 15 MG tablet  Hypertension - Plan: hydrochlorothiazide (HYDRODIURIL) 25 MG tablet  Suspect that patient's insomnia is causing difficulty with concentration and memory.  Add Trazodone for insomnia. Continue to use ambien as needed.  Follow up in 4-6 weeks for re-evaluation.  Counseled on previously measured lab values (12/29/12) of high LDH and high total cholesterol - pt will  attempt lifestyle modification with diet changes.

## 2013-03-24 ENCOUNTER — Encounter: Payer: Self-pay | Admitting: Physician Assistant

## 2013-03-24 NOTE — Progress Notes (Signed)
I have examined this patient along with the student and agree.  

## 2013-03-28 ENCOUNTER — Telehealth: Payer: Self-pay

## 2013-03-28 NOTE — Telephone Encounter (Signed)
THIS MESSAGE IS FOR Wanda [name],  PATIENT WANTS YOU TO CALL IN THE CHOLESTEROL MEDICATION THAT WAS TALKED ABOUT AT HER LAST VISIT. BEST PHONE [phone] (CELL)  PHARMACY CHOICE IS [facility] ON [address].   MBC

## 2013-03-29 MED ORDER — ATORVASTATIN CALCIUM 20 MG PO TABS: 20.0000 mg | ORAL_TABLET | Freq: Every day | ORAL | Status: DC

## 2013-03-29 NOTE — Telephone Encounter (Signed)
Great.  I'll look forward to Dr. Acquanetta Sit note and seeing her again.

## 2013-03-29 NOTE — Telephone Encounter (Signed)
Rx sent. Please advise patient that she needs to have fasting labs 12 weeks after starting this medication.  I'll look forward to seeing her then.  Meds ordered this encounter  Medications  . atorvastatin (LIPITOR) 20 MG tablet    Sig: Take 1 tablet (20 mg total) by mouth daily.    Dispense:  90 tablet    Refill:  3    Order Specific Question:  Supervising Provider    Answer:  DOOLITTLE, ROBERT P [[id number]]

## 2013-03-29 NOTE — Telephone Encounter (Signed)
Called her to advise she wants you to know she did see the Eye Doctor, Dr Louanna Raw and the eye doctor was pleased with her choosing you as her PCP

## 2013-04-25 ENCOUNTER — Encounter: Payer: Self-pay | Admitting: *Deleted

## 2013-04-25 NOTE — Progress Notes (Signed)
Patient ID: Wanda [name], female   DOB: [birth year], 62 y.o.   MRN: [id number] [employer] approved Ambein 10 mg 03/18/13 through 03/18/14.

## 2013-04-27 ENCOUNTER — Ambulatory Visit (INDEPENDENT_AMBULATORY_CARE_PROVIDER_SITE_OTHER): Payer: Federal, State, Local not specified - PPO | Admitting: Physician Assistant

## 2013-04-27 ENCOUNTER — Encounter: Payer: Self-pay | Admitting: Physician Assistant

## 2013-04-27 VITALS — BP 124/78 | HR 64 | Temp 98.9°F | Resp 16 | Ht 60.5 in | Wt 143.2 lb

## 2013-04-27 DIAGNOSIS — I10 Essential (primary) hypertension: Secondary | ICD-10-CM

## 2013-04-27 DIAGNOSIS — G4700 Insomnia, unspecified: Secondary | ICD-10-CM

## 2013-04-27 DIAGNOSIS — Z23 Encounter for immunization: Secondary | ICD-10-CM

## 2013-04-27 DIAGNOSIS — E785 Hyperlipidemia, unspecified: Secondary | ICD-10-CM

## 2013-04-27 NOTE — Progress Notes (Signed)
  Subjective:    Patient ID: Wanda [name], female    DOB: 10-11-1950, 62 y.o.   MRN: [id number]  HPI This 62 y.o. female presents for evaluation of insomnia.  See last note.  Lack of sleep was causing difficulty with mood, memory, "my life."  Tolerating trazodone without adverse effects.  Very pleased with the results.  "I believe you may have saved my life.  I had no idea how sleep deprived I was." Sleeps 8-9 hours/night.  Feels "like a new person."   Review of Systems No chest pain, SOB, HA, dizziness, vision change, N/V, diarrhea, constipation, dysuria, urinary urgency or frequency, myalgias, arthralgias or rash.     Objective:   Physical Exam  Blood pressure 124/78, pulse 64, temperature 98.9 F (37.2 C), temperature source Oral, resp. rate 16, height 5' 0.5" (1.537 m), weight 143 lb 3.2 oz (64.955 kg), SpO2 98.00%. Body mass index is 27.5 kg/(m^2). Well-developed, well nourished BF who is awake, alert and oriented, in NAD. HEENT: [place]/AT, sclera and conjunctiva are clear.   Neck: supple, non-tender, no lymphadenopathy, thyromegaly. Heart: RRR, no murmur Lungs: normal effort, CTA Extremities: no cyanosis, clubbing or edema. Skin: warm and dry without rash. Psychologic: good mood and appropriate affect, normal speech and behavior.       Assessment & Plan:  Insomnia -controlled.  Continue current treatment and healthy lifestyle changes.  Hypertension - controlled.  Continue current medication.  Hyperlipidemia - started atorvastatin about 3 weeks ago.  Repeat lipids and CMET in 8-10 weeks.  Need for Tdap vaccination - Plan: Tdap vaccine greater than or equal to 7yo IM  Fernande Bras, PA-C Physician Assistant-Certified [facility]

## 2013-04-27 NOTE — Patient Instructions (Signed)
Keep up the great work of living your fabulous life and taking care of you.

## 2013-06-29 ENCOUNTER — Ambulatory Visit (INDEPENDENT_AMBULATORY_CARE_PROVIDER_SITE_OTHER): Payer: Federal, State, Local not specified - PPO | Admitting: Physician Assistant

## 2013-06-29 ENCOUNTER — Encounter: Payer: Self-pay | Admitting: Physician Assistant

## 2013-06-29 VITALS — BP 133/82 | HR 81 | Temp 98.0°F | Resp 16 | Ht 59.5 in | Wt 134.0 lb

## 2013-06-29 DIAGNOSIS — Z720 Tobacco use: Secondary | ICD-10-CM

## 2013-06-29 DIAGNOSIS — F17200 Nicotine dependence, unspecified, uncomplicated: Secondary | ICD-10-CM

## 2013-06-29 DIAGNOSIS — E785 Hyperlipidemia, unspecified: Secondary | ICD-10-CM

## 2013-06-29 MED ORDER — VARENICLINE TARTRATE 1 MG PO TABS: 1.0000 mg | ORAL_TABLET | Freq: Two times a day (BID) | ORAL | Status: DC

## 2013-06-29 NOTE — Patient Instructions (Signed)
To start the Chantix: Select a quit date, at least 7 days in the future. You should take the Chantix for at least 7 days before you stop smoking. Start the Chantix, taking 1/2 tablet each day for 3 days, then 1/2 tablet twice daily for 3 days, the 1/2 tablet each morning and 1 WHOLE tablet each evening for 3 days, then 1 WHOLE tablet twice daily. If you have side effects, reduce to the previous dose for 3-7 additional days, then try increasing again.

## 2013-06-29 NOTE — Progress Notes (Signed)
  Subjective:    Patient ID: Wanda [name], female    DOB: 1951-06-02, 62 y.o.   MRN: [id number]  HPI  This 62 y.o. female presents for evaluation of hyperlipidemia after starting atorvastatin. She feels good, tolerating her medications without adverse effects, and still gets excellent relief of insomnia with trazodone. She'd like to try Chantix again to quit smoking.  Medications, allergies, past medical history, surgical history, family history, social history and problem list reviewed.  Review of Systems No chest pain, SOB, HA, dizziness, vision change, N/V, diarrhea, constipation, dysuria, urinary urgency or frequency, myalgias, arthralgias or rash.     Objective:   Physical Exam Blood pressure 133/82, pulse 81, temperature 98 F (36.7 C), temperature source Oral, resp. rate 16, height 4' 11.5" (1.511 m), weight 134 lb (60.782 kg), SpO2 99.00%. Body mass index is 26.62 kg/(m^2). Well-developed, well nourished BF who is awake, alert and oriented, in NAD. HEENT: [place]/AT, sclera and conjunctiva are clear.  Neck: supple, non-tender, no lymphadenopathy, thyromegaly. Heart: RRR, no murmur Lungs: normal effort, CTA Extremities: no cyanosis, clubbing or edema. Skin: warm and dry without rash. Psychologic: good mood and appropriate affect, normal speech and behavior.        Assessment & Plan:  Hyperlipidemia - Plan: Comprehensive metabolic panel, Lipid panel  Tobacco abuse - Plan: varenicline (CHANTIX) 1 MG tablet  Fernande Bras, PA-C Physician Assistant-Certified [facility]

## 2013-06-30 ENCOUNTER — Encounter: Payer: Self-pay | Admitting: Physician Assistant

## 2013-06-30 LAB — COMPREHENSIVE METABOLIC PANEL
ALT: 18 U/L (ref 0–35)
AST: 16 U/L (ref 0–37)
Albumin: 4.7 g/dL (ref 3.5–5.2)
Alkaline Phosphatase: 82 U/L (ref 39–117)
BUN: 11 mg/dL (ref 6–23)
Potassium: 3.7 mEq/L (ref 3.5–5.3)
Sodium: 141 mEq/L (ref 135–145)

## 2013-06-30 LAB — LIPID PANEL
HDL: 70 mg/dL (ref >39)
LDL Cholesterol: 85 mg/dL (ref 0–99)

## 2013-07-17 ENCOUNTER — Other Ambulatory Visit: Payer: Self-pay | Admitting: Physician Assistant

## 2013-08-01 ENCOUNTER — Other Ambulatory Visit: Payer: Self-pay | Admitting: Physician Assistant

## 2013-08-05 DIAGNOSIS — G35 Multiple sclerosis: Secondary | ICD-10-CM

## 2013-08-05 HISTORY — DX: Multiple sclerosis: G35

## 2013-08-10 ENCOUNTER — Telehealth: Payer: Self-pay

## 2013-08-10 ENCOUNTER — Other Ambulatory Visit: Payer: Self-pay | Admitting: Physician Assistant

## 2013-08-10 NOTE — Telephone Encounter (Signed)
RX for Ambien 10 mg faxed to [facility] on [address].

## 2013-08-21 ENCOUNTER — Ambulatory Visit (INDEPENDENT_AMBULATORY_CARE_PROVIDER_SITE_OTHER): Payer: Federal, State, Local not specified - PPO | Admitting: Physician Assistant

## 2013-08-21 VITALS — BP 128/76 | HR 76 | Temp 98.5°F | Resp 16 | Ht 59.5 in | Wt 132.4 lb

## 2013-08-21 DIAGNOSIS — R5381 Other malaise: Secondary | ICD-10-CM

## 2013-08-21 DIAGNOSIS — H539 Unspecified visual disturbance: Secondary | ICD-10-CM

## 2013-08-21 DIAGNOSIS — R479 Unspecified speech disturbances: Secondary | ICD-10-CM

## 2013-08-21 DIAGNOSIS — R531 Weakness: Secondary | ICD-10-CM

## 2013-08-21 DIAGNOSIS — R32 Unspecified urinary incontinence: Secondary | ICD-10-CM

## 2013-08-21 DIAGNOSIS — R4789 Other speech disturbances: Secondary | ICD-10-CM

## 2013-08-21 DIAGNOSIS — R269 Unspecified abnormalities of gait and mobility: Secondary | ICD-10-CM

## 2013-08-21 LAB — POCT UA - MICROSCOPIC ONLY
Crystals, Ur, HPF, POC: NEGATIVE
RBC, urine, microscopic: NEGATIVE

## 2013-08-21 LAB — POCT URINALYSIS DIPSTICK
Bilirubin, UA: NEGATIVE
Glucose, UA: NEGATIVE
Ketones, UA: NEGATIVE
Leukocytes, UA: NEGATIVE
Nitrite, UA: NEGATIVE
Spec Grav, UA: 1.015

## 2013-08-21 NOTE — Patient Instructions (Signed)
You should get a call in the next 1-2 days to schedule the CT scan of your head.  If you don't, please contact me. Once we review the results of the scan, I'll get you set up with the Neurologist.

## 2013-08-21 NOTE — Progress Notes (Signed)
Subjective:    Patient ID: Wanda [name], female    DOB: 04-25-1951, 62 y.o.   MRN: [id number]  HPI  This 62 y.o. female presents for evaluation of weakness, loss of balance, dizziness, slurred speech and visual changes x 3 weeks. She notes that her symptoms have improved over the past 7 days.  "According to my family, I'm slurring my words.  I've already lost my vision.  I'm losing control of my urine.  I can't walk a straight line. My memory is not good any more.  I can't sleep.  I was already an insomniac, and now I can't sleep.  My memory isn't any good.  I picked up the medication you just gave me, but I don't know where it is.  Can't seem to get my footing, everything is spinning. I can't even think about going to the bathroom, I just gush urine.  I'm falling.  I've got to take my dogs out to pee-pee in the mornings.  I'm afraid I'm going to fall.  I take a stick to use as a cane.  But  I try to go one way, and my body goes the other way. The other day I fell and the stick rolls under a car in th parking lot.  And I can't get up, I'm too weak. And this man had to come help me back to my apartment.  And I can't be embarrassed, I'm just hoping I don't pee on this man. I thought a nice warm bath would help my muscles, so I got in and relaxed for about an hour and it felt so nice.  But then it took me 30 minutes to get out of the bathtub, so I knew I needed to come in to see you."  I last saw her 06/29/2013 for follow up and she reported doing really well.  We went through her medications, which she brought with her today, and there are none missing, so it's unclear what she thinks she misplaced.  She never filled the Chantix, due to cost.  Her sleep had improved dramatically since I first met her in 03/2013.  She denies chest pain, shortness of breath, headache, neck pain.  No burning with urination.  No increase in urinary frequency.  No bowel changes, loss of bowel control.  No tremor.  She's not  dropping things.    Her family history is notable for a brother with MS. Medications, allergies, past medical history, surgical history, family history, social history and problem list reviewed.  Review of Systems As above.    Objective:   Physical Exam  Vitals reviewed. Constitutional: She is oriented to person, place, and time. Vital signs are normal. She appears well-developed and well-nourished. She is active and cooperative. No distress.  HENT:  Head: Normocephalic and atraumatic.  Right Ear: Hearing, tympanic membrane, external ear and ear canal normal.  Left Ear: Hearing, tympanic membrane, external ear and ear canal normal.  Nose: Nose normal.  Mouth/Throat: Uvula is midline and oropharynx is clear and moist. No oropharyngeal exudate.  Eyes: Conjunctivae, EOM and lids are normal. Pupils are equal, round, and reactive to light. Right eye exhibits no discharge. Left eye exhibits no discharge. No scleral icterus.  Fundoscopic exam:      The right eye shows no hemorrhage and no papilledema. The right eye shows red reflex.       The left eye shows no hemorrhage and no papilledema. The left eye shows red reflex.  Neck:  Normal range of motion and full passive range of motion without pain. Neck supple. Normal carotid pulses and no JVD present. Carotid bruit is not present. No mass and no thyromegaly present.  Cardiovascular: Normal rate, regular rhythm, normal heart sounds and intact distal pulses.   Pulmonary/Chest: Effort normal and breath sounds normal.  Abdominal: Soft. Bowel sounds are normal. She exhibits no distension. There is no hepatosplenomegaly. There is no tenderness.  Musculoskeletal: Normal range of motion. She exhibits no edema and no tenderness.  Lymphadenopathy:    She has no cervical adenopathy.  Neurological: She is alert and oriented to person, place, and time. She has normal reflexes. She displays no atrophy and no tremor. A sensory deficit (decreased sensation of  the LEFT LE compared to the RIGHT, but the opposite for the upper extremities) is present. No cranial nerve deficit. She displays no seizure activity. Gait (she favors the LEFT leg and denies pain) abnormal.  Reduced strength of the LEFT upper and lower extremities when testing against the RIGHT.  Strength improves on the LEFT when tested alone.  Skin: Skin is warm and dry.  Psychiatric: She has a normal mood and affect. Her behavior is normal. Thought content normal. Her speech is tangential (mildly). Her speech is not rapid and/or pressured, not delayed and not slurred. Cognition and memory are impaired (mostly per her report.  I'm not clear about the medication that she lost, as her memory appears to be good.).    BP 128/76  Pulse 76  Temp(Src) 98.5 F (36.9 C) (Oral)  Resp 16  Ht 4' 11.5" (1.511 m)  Wt 132 lb 6.4 oz (60.056 kg)  BMI 26.30 kg/m2  SpO2 100%   Visual Acuity Screening   Right eye Left eye Both eyes  Without correction: 20/50-1 20/40-1 20/40-1  With correction:        Results for orders placed in visit on 08/21/13  POCT UA - MICROSCOPIC ONLY      Result Value Range   WBC, Ur, HPF, POC 3-5     RBC, urine, microscopic neg     Bacteria, U Microscopic trace     Mucus, UA neg     Epithelial cells, urine per micros 3-5     Crystals, Ur, HPF, POC neg     Casts, Ur, LPF, POC neg     Yeast, UA neg    POCT URINALYSIS DIPSTICK      Result Value Range   Color, UA yellow     Clarity, UA slightly cloudy     Glucose, UA neg     Bilirubin, UA neg     Ketones, UA neg     Spec Grav, UA 1.015     Blood, UA neg     pH, UA 5.5     Protein, UA neg     Urobilinogen, UA 0.2     Nitrite, UA neg     Leukocytes, UA Negative         Assessment & Plan:  Weakness, Speech complaints, Gait disturbance, Vision changes - Plan: CT Head Wo Contrast. Plan referral to neurology pending results of CT.  Urinary incontinence - Plan: POCT UA - Microscopic Only, POCT urinalysis  dipstick  Fernande Bras, PA-C Physician Assistant-Certified [facility]

## 2013-08-25 ENCOUNTER — Telehealth: Payer: Self-pay

## 2013-08-25 ENCOUNTER — Other Ambulatory Visit: Payer: Federal, State, Local not specified - PPO

## 2013-08-25 ENCOUNTER — Ambulatory Visit
Admission: RE | Admit: 2013-08-25 | Discharge: 2013-08-25 | Disposition: A | Discharge status: Home / Self Care | Payer: Federal, State, Local not specified - PPO | Source: Ambulatory Visit | Attending: Physician Assistant | Admitting: Physician Assistant

## 2013-08-25 DIAGNOSIS — R531 Weakness: Secondary | ICD-10-CM

## 2013-08-25 DIAGNOSIS — R269 Unspecified abnormalities of gait and mobility: Secondary | ICD-10-CM

## 2013-08-25 DIAGNOSIS — R479 Unspecified speech disturbances: Secondary | ICD-10-CM

## 2013-08-25 NOTE — Telephone Encounter (Signed)
Seen recently by [name]. Needs rx for muscle spasms called in to her pharmacy. Uses [facility] on [address] Cb# [phone]

## 2013-08-25 NOTE — Telephone Encounter (Signed)
She has been seen for weakness/ neurology referral made, meds for muscle spasms may not be a great plan for her, please advise.

## 2013-08-28 ENCOUNTER — Telehealth: Payer: Self-pay

## 2013-08-28 NOTE — Telephone Encounter (Signed)
Called, she should hold on muscle spasms until after the Neurologist.

## 2013-08-28 NOTE — Telephone Encounter (Signed)
Called her to advise.  

## 2013-08-28 NOTE — Telephone Encounter (Signed)
Patient is having muscle spasms in her legs really bad wants to know if there is anyway we can prescribe her anything for this please call her at [phone]

## 2013-08-28 NOTE — Telephone Encounter (Signed)
Advised her, to hold off and if she is worsening she should return to clinic, she agrees.

## 2013-08-28 NOTE — Telephone Encounter (Signed)
Agree that we should hold off on muscle relaxers until she's evaluated by neurology.  I don't see the referral, so I'm placing it again.

## 2013-08-30 ENCOUNTER — Other Ambulatory Visit: Payer: Self-pay | Admitting: Physician Assistant

## 2013-09-05 ENCOUNTER — Ambulatory Visit (INDEPENDENT_AMBULATORY_CARE_PROVIDER_SITE_OTHER): Payer: Federal, State, Local not specified - PPO | Admitting: Neurology

## 2013-09-05 ENCOUNTER — Encounter: Payer: Self-pay | Admitting: Neurology

## 2013-09-05 ENCOUNTER — Encounter (INDEPENDENT_AMBULATORY_CARE_PROVIDER_SITE_OTHER): Payer: Self-pay

## 2013-09-05 VITALS — BP 113/71 | HR 73 | Ht 59.5 in | Wt 130.0 lb

## 2013-09-05 DIAGNOSIS — R5381 Other malaise: Secondary | ICD-10-CM

## 2013-09-05 DIAGNOSIS — G4700 Insomnia, unspecified: Secondary | ICD-10-CM

## 2013-09-05 DIAGNOSIS — M62838 Other muscle spasm: Secondary | ICD-10-CM

## 2013-09-05 DIAGNOSIS — R531 Weakness: Secondary | ICD-10-CM

## 2013-09-05 DIAGNOSIS — R29818 Other symptoms and signs involving the nervous system: Secondary | ICD-10-CM

## 2013-09-05 DIAGNOSIS — R2689 Other abnormalities of gait and mobility: Secondary | ICD-10-CM

## 2013-09-05 NOTE — Patient Instructions (Addendum)
I think overall you are doing fairly well but I do want to suggest a few things today:  Remember to drink plenty of fluid, eat healthy meals and do not skip any meals. Try to eat protein with a every meal and eat a healthy snack such as fruit or nuts in between meals. Try to keep a regular sleep-wake schedule and try to exercise daily, particularly in the form of walking, 20-30 minutes a day, if you can.   Engage in social activities in your community and with your family and try to keep up with current events by reading the newspaper or watching the news.   As far as your medications are concerned, I would like to suggest no new medications. Do not take any medication other than how it is prescribed. In particular, do not take trazodone during the day and you cannot drink alcohol with it.   Please stop smoking and stop drinking alcohol and also beer.    As far as diagnostic testing: MRI brain, blood work.  I would like to see you back in 3 months, sooner if we need to. Please call us with any interim questions, concerns, problems, updates or refill requests.  Please also call us for any test results so we can go over those with you on the phone. Brett Canales is my clinical assistant and will answer any of your questions and relay your messages to me and also relay most of my messages to you.  Our phone number is [phone]. We also have an after hours call service for urgent matters and there is a physician on-call for urgent questions. For any emergencies you know to call 911 or go to the nearest emergency room.

## 2013-09-05 NOTE — Progress Notes (Signed)
Subjective:    Patient ID: Wanda [name] is a 62 y.o. female.  HPI   Huston Foley, MD, PhD [facility] [address] [address] [place], [place] [address]  Dear Avelino Leeds,   I saw your patient, Wanda [name], upon your kind request in my neurologic clinic today for initial consultation of her weakness. The patient is accompanied by her daughter, Sharlet Salina, today. As you know, Wanda [name] is a very pleasant 62 year old right-handed woman with an underlying medical history of hypertension, hyperlipidemia, arthritis, smoking, and insomnia, who reports new onset left-sided weakness, slurring of speech, intermittent vertigo for the past 3 weeks. She had a head CT without contrast on 08/25/2013: Moderate for age nonspecific cerebral white matter changes, most commonly due to chronic small vessel disease. No acute or subacute cortically based infarct identified and no other intracranial abnormality. She reports a gradual onset of slurring of speech, L arm > L leg weakness, balance problems and "being in a trance" intermittently. She has a longstanding Hx of insomnia since 2001. She has fallen at least 3-4 times. She describes intermittent vertiginous Sx. She reports difficulty swallowing.  She smokes about 1 pack every 2 days. She drinks beer daily, about 6 beers/day. She states, she started drinking more in the last 2 weeks, to help her with her sleep and her muscle pain and muscle spasms and since she did not get a muscle relaxer.  Her daughter adds, that patient has been taking the trazodone during the day and not always at night and she does drink beer daily. The patient added, that until about a month ago, she was juicing every day, was not smoking and not drinking, then, she had culmination of a lot of stressors, including her dog became paralized, her son came out of prison, and she started drinking beer, taking the trazodone during the day, as she could not sleep and she  started smoking to get relief from her stress, her muscle spasms and her pain. She does get defensive during this part of our visit.  She cannot take an aspirin as it upsets her stomach.  Her Past Medical History Is Significant For: Past Medical History  Diagnosis Date  . Elevated cholesterol   . Hypertension   . Arthritis   . Cervical dysplasia   . Depression   . Anxiety   . Macular degeneration of right eye 2001    Her Past Surgical History Is Significant For: Past Surgical History  Procedure Laterality Date  . Colposcopy    . Knee surgery    . Cholecystectomy    . Tubal ligation      Her Family History Is Significant For: Family History  Problem Relation Age of Onset  . Cancer Mother     Colon  . Diabetes Mother   . Hypertension Mother   . Arthritis Mother   . Cancer Father     prostate  . Kidney disease Brother     congenital single kidney  . Arthritis Sister   . Arthritis Sister   . Hematuria Son   . Gout Brother   . Multiple sclerosis Brother   . Arthritis Brother   . HIV Brother   . Cancer Brother     spinal    Her Social History Is Significant For: History   Social History  . Marital Status: Widowed    Spouse Name: N/A    Number of Children: 2  . Years of Education: N/A   Occupational  History  . retired     Social History Main Topics  . Smoking status: Current Some Day Smoker -- 1.00 packs/day for 45 years    Last Attempt to Quit: 01/21/2013  . Smokeless tobacco: None  . Alcohol Use: 3.0 oz/week    6 drink(s) per week  . Drug Use: No  . Sexual Activity: No   Other Topics Concern  . None   Social History Narrative   Grew up in [place] area, 1 of 10 siblings - 5 still living, married for 33 years then divorced, moved to [place] to be near daughter post retirement, als has 1 son. 2 dogs.       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     Her Allergies Are:  No Known Allergies:   Her Current Medications Are:   Outpatient Encounter Prescriptions as of 09/05/2013  Medication Sig  . atorvastatin (LIPITOR) 20 MG tablet Take 1 tablet (20 mg total) by mouth daily.  Marland Kitchen estradiol (ESTRACE) 0.5 MG tablet Take 1 tablet (0.5 mg total) by mouth daily.  Marland Kitchen glucosamine-chondroitin 500-400 MG tablet Take 1 tablet by mouth 2 (two) times daily with a meal.  . hydrochlorothiazide (HYDRODIURIL) 25 MG tablet take 1 tablet by mouth once daily  . medroxyPROGESTERone (PROVERA) 2.5 MG tablet Take 1 tablet (2.5 mg total) by mouth daily.  . meloxicam (MOBIC) 15 MG tablet Take 1 tablet (15 mg total) by mouth daily.  . traZODone (DESYREL) 50 MG tablet TAKE 1/2 TO 1 TABLET AT BEDTIME AS NEEDED FOR SLEEP.  Marland Kitchen zolpidem (AMBIEN) 10 MG tablet take 1 tablet by mouth at bedtime if needed  . varenicline (CHANTIX) 1 MG tablet Take 1 tablet (1 mg total) by mouth 2 (two) times daily.  . [DISCONTINUED] traZODone (DESYREL) 50 MG tablet TAKE 1/2 TO 1 TABLET AT BEDTIME AS NEEDED FOR SLEEP.   Review of Systems:  Out of a complete 14 point review of systems, all are reviewed and negative with the exception of these symptoms as listed below:   Review of Systems  Constitutional: Positive for activity change, appetite change and fatigue.  HENT: Positive for trouble swallowing.   Eyes: Positive for visual disturbance (diplopia, loss of vision).  Respiratory: Negative.   Cardiovascular: Negative.   Gastrointestinal:       Incontinence  Endocrine: Positive for polydipsia.  Genitourinary: Negative.   Musculoskeletal: Positive for arthralgias and myalgias.  Skin: Negative.   Allergic/Immunologic: Negative.   Neurological: Positive for dizziness, speech difficulty and weakness.       Memory loss  Hematological: Negative.   Psychiatric/Behavioral: Positive for hallucinations, sleep disturbance, dysphoric mood and decreased concentration. The patient is nervous/anxious.     Objective:  Neurologic Exam  Physical Exam Physical Examination:    Filed Vitals:   09/05/13 0824  BP: 113/71  Pulse: 73    General Examination: The patient is a 62 y.o. female in no acute distress. She appears adequately groomed.   HEENT: Normocephalic, atraumatic, pupils are equal, round and reactive to light and accommodation. Extraocular tracking is good without limitation to gaze excursion or nystagmus noted. Normal smooth pursuit is noted. Hearing is grossly intact. Face is symmetric with normal facial animation and normal facial sensation. Speech is clear with no dysarthria noted. There is no hypophonia. There is no lip, neck/head, jaw or voice tremor. Neck is supple with full range of passive and active motion. There are no carotid bruits on auscultation. Oropharynx exam  reveals: moderate mouth dryness, adequate dental hygiene and moderate airway crowding, due to narrow airway and redundant soft palate and mildly elongated tongue. Mallampati is class III. Tongue protrudes centrally and palate elevates symmetrically.    Chest: Clear to auscultation without wheezing, rhonchi or crackles noted.  Heart: S1+S2+0, regular and normal without murmurs, rubs or gallops noted.   Abdomen: Soft, non-tender and non-distended with normal bowel sounds appreciated on auscultation.  Extremities: There is no pitting edema in the distal lower extremities bilaterally. Pedal pulses are intact.  Skin: Warm and dry without trophic changes noted. There are no varicose veins.  Musculoskeletal: exam reveals no obvious joint deformities, tenderness or joint swelling or erythema.   Neurologically:  Mental status: The patient is awake, alert and oriented in all 4 spheres. Her memory, attention, language and knowledge are appropriate. There is no aphasia, agnosia, apraxia or anomia. Speech is clear with normal prosody and enunciation. Thought process is linear. Afect is constricted.  Cranial nerves are as described above under HEENT exam. In addition, shoulder shrug is normal  with equal shoulder height noted. Motor exam: Normal bulk, and tone is noted. She has variable strength. There is giveaway weakness. She does not provide a full effort as far as I can tell. She has variable weakness in both upper extremities and both grip strength and in both hip flexors. There is no drift, tremor or rebound. Romberg is negative, but does show at times significant swaying with no corrective steps. Reflexes are 2+ throughout. Toes are downgoing bilaterally. Fine motor skills are intact with normal finger taps, normal hand movements, normal rapid alternating patting, normal foot taps and normal foot agility. There are no visible fasciculations and no focal atrophy. Cerebellar testing shows no dysmetria or intention tremor on finger to nose testing. There is no truncal or gait ataxia.  Sensory exam is intact to light touch, pinprick, vibration, temperature sense in the upper and lower extremities.  Gait, station and balance: She stands up slowly and pushes herself up. She has no tendency to stagger or lean to one side. She walks with a limp. There is no circumduction. She has no scissoring or steppage gait. She has preserved arm swing. She is able to pull herself up on her toes and balance on her heels briefly. She has trouble with tandem walk. She turns without problems.   Assessment and Plan:    In summary, Wanda [name] is a 62 y.o.-year old female with a three-week history of gradual onset slurring of speech, balance problems, or left-sided weakness, left-sided muscle pain and spasms. She also reports severe insomnia for which she has been taking trazodone. Unfortunately she has not adhere to taking trazodone just at night. She also started smoking and drinking beer regularly about a month ago. This also in combination at times with trazodone. Her history and physical exam are not telltale for stroke. She reports a family history of multiple sclerosis in her brother. I advised her that I  am not sure how to tie in all of her issues together in particular in light of possible medication side effect or overuse and interaction with alcohol. Today I have advised her strongly to stop smoking, not to take trazodone other than prescribed and to stop drinking alcohol altogether. A small stroke may not show up on CT scan. She does have moderate for age White matter disease. I would like to go ahead and do a brain MRI and additional blood work. She has been using  a cane. I have asked her to consider moving in with her daughter as the patient lives alone and reports having had trouble getting out of the bathtub. She has also fallen. For safety, I have advised them that it may be better for her to stay with her daughter for the time being. The patient feels that she is safe and that she is getting better. I will see her back in 3 months from now, sooner if the need arises. In the interim, if we need to add more tests or redirect her in any other way based on her test results we will call her. She and her daughter were in agreement.  Thank you very much for allowing me to participate in the care of this nice patient. If I can be of any further assistance to you please do not hesitate to call me at [phone].  Sincerely,   Huston Foley, MD, PhD

## 2013-09-06 ENCOUNTER — Telehealth: Payer: Self-pay | Admitting: *Deleted

## 2013-09-06 ENCOUNTER — Telehealth: Payer: Self-pay | Admitting: Radiology

## 2013-09-06 NOTE — Telephone Encounter (Signed)
Pt was call and wanted to know the results of her CT  [phone]

## 2013-09-06 NOTE — Telephone Encounter (Signed)
Yes we did discuss with her, she has had a MRI ordered by Neurology and wants this result. Have given her the number to call the Neurologist. She will do this. She is asking if you will prescribe a muscle relaxer for her, she did not ask Neurologist about this. She is still c/o balance disturbance and muscle spasm of her hands/ and left arm

## 2013-09-06 NOTE — Telephone Encounter (Signed)
I believe we've already reviewed the results with her, and the neurologist did as well.  It showed no stroke or mass or anything that would cause her symptoms.  It did show some changes that occur with age, that are somewhat more advanced than we would expect at her age, but not that indicates anything in particular.

## 2013-09-06 NOTE — Telephone Encounter (Signed)
PT CALLED AGAIN TODAY WANTING TO KNOW RESULTS. PLEASE CALL PT AT [phone]

## 2013-09-06 NOTE — Telephone Encounter (Signed)
Patient has called back again to discuss CT Scan. I advised her I am very concerned she does not remember discussing this with me. She does remember me telling her about the Neurology evaluation, but does not remember discussing the CT. She also remembers our prior discussion about the muscle relaxers. I am very concerned about the continued conversation, she seems very confused. She also relates this phone conversation she has not had the MRI yet. Advised her I am concerned about her memory, she does not recall our conversations from weeks ago, nor from a few minutes ago. Please advise.

## 2013-09-06 NOTE — Telephone Encounter (Signed)
Patient has called back again to discuss CT Scan. I advised her I am very concerned she does not remember discussing this with me. She does remember me telling her about the Neurology evaluation, but does not remember discussing the CT. She also remembers our prior discussion about the muscle relaxers.

## 2013-09-06 NOTE — Telephone Encounter (Signed)
Error

## 2013-09-07 ENCOUNTER — Telehealth: Payer: Self-pay

## 2013-09-07 LAB — B12 AND FOLATE PANEL
Folate: 11.8 ng/mL (ref >3.0)
Vitamin B-12: 547 pg/mL (ref 211–946)

## 2013-09-07 LAB — SEDIMENTATION RATE: Sed Rate: 8 mm/hr (ref 0–40)

## 2013-09-07 LAB — ANA W/REFLEX: Anti Nuclear Antibody(ANA): NEGATIVE

## 2013-09-07 LAB — TSH: TSH: 0.463 u[IU]/mL (ref 0.450–4.500)

## 2013-09-07 NOTE — Telephone Encounter (Signed)
See 09/06/2013 [facility] Note re: patient status under.   I relayed inquiry from and to [facility], [place] and per consult with Dr. Frances Furbish.

## 2013-09-07 NOTE — Telephone Encounter (Signed)
Thanks, I have called patient to advise, asked her to come back in. When I called today she seemed more alert. She advised she will come in for this and for the cramping in her hand.

## 2013-09-07 NOTE — Progress Notes (Signed)
Quick Note:  Please advise pt's daughter, that labs were fine, with the exception of a borderline hemoglobin A1c which indicates an increased risk for diabetes. Thyroid function, vitamin B12, vitamin B1, autoimmune testing and inflammatory testing as well as muscle enzymes were fine. Huston Foley, MD, PhD [facility]  ______

## 2013-09-07 NOTE — Telephone Encounter (Signed)
Called daughter, but voice mail not set up.

## 2013-09-07 NOTE — Telephone Encounter (Signed)
Spoke with Vikki Ports, RN at [facility]. Advised her of the concerns we have for Ms. Steinhaus memory problems. She will relay the information to Dr. Frances Furbish and get back with me. If Dr. Frances Furbish doesn't plan to see this patient today or tomorrow, will ask that the patient come in to our office tomorrow for evaluation, tonight if needed. In the meantime, please call this patient's daughter and get input from her on the patient's memory, sleeping, etc.

## 2013-09-07 NOTE — Telephone Encounter (Signed)
Received call back from [place] at [facility] with message from Dr. Frances Furbish. "If patient is having significant changes," she should go to the emergency department. They have ordered the MRI, but there is nothing they can do acutely at [facility].  Please advise the patient/her daughter to go to the emergency department or return here for evaluation.

## 2013-09-08 ENCOUNTER — Telehealth: Payer: Self-pay | Admitting: *Deleted

## 2013-09-08 NOTE — Telephone Encounter (Signed)
Shared labs with patient per Dr Teofilo Pod findings, patient verbalized understanding, is waiting for scheduling of MRI

## 2013-09-13 ENCOUNTER — Other Ambulatory Visit: Payer: Self-pay | Admitting: Physician Assistant

## 2013-09-21 ENCOUNTER — Ambulatory Visit (INDEPENDENT_AMBULATORY_CARE_PROVIDER_SITE_OTHER): Payer: Federal, State, Local not specified - PPO

## 2013-09-21 DIAGNOSIS — G4700 Insomnia, unspecified: Secondary | ICD-10-CM

## 2013-09-21 DIAGNOSIS — M62838 Other muscle spasm: Secondary | ICD-10-CM

## 2013-09-21 DIAGNOSIS — R531 Weakness: Secondary | ICD-10-CM

## 2013-09-21 DIAGNOSIS — R2689 Other abnormalities of gait and mobility: Secondary | ICD-10-CM

## 2013-09-21 DIAGNOSIS — R5381 Other malaise: Secondary | ICD-10-CM

## 2013-09-21 DIAGNOSIS — R29818 Other symptoms and signs involving the nervous system: Secondary | ICD-10-CM

## 2013-09-25 ENCOUNTER — Telehealth: Payer: Self-pay | Admitting: *Deleted

## 2013-09-25 ENCOUNTER — Telehealth: Payer: Self-pay | Admitting: Radiology

## 2013-09-25 DIAGNOSIS — M62838 Other muscle spasm: Secondary | ICD-10-CM

## 2013-09-25 DIAGNOSIS — R2689 Other abnormalities of gait and mobility: Secondary | ICD-10-CM

## 2013-09-25 DIAGNOSIS — R9089 Other abnormal findings on diagnostic imaging of central nervous system: Secondary | ICD-10-CM

## 2013-09-25 DIAGNOSIS — R531 Weakness: Secondary | ICD-10-CM

## 2013-09-25 NOTE — Telephone Encounter (Signed)
I called and spoke with patient. I let her know that her MRI was abnormal but, Dr. Frances Furbish needs to have some further testing to help determine what's going on. Dr. Frances Furbish has made referrals for MRI of the brain with contrast and an MRI of the spine with and without contrast. I let her know that Dr. Frances Furbish did send in those referrals today. It will be a bit before she hears from someone about scheduling the MRI, not until after the holidays. Patient thanked me.

## 2013-09-25 NOTE — Telephone Encounter (Signed)
There are abnormalities on her MRI that could explain her symptoms, and need additional evaluation.  The neurologist should contact her with more specific information and plans for follow-up with them.

## 2013-09-25 NOTE — Telephone Encounter (Signed)
Patient states she is anxious for her MRI results, wants to know if you have reviewed them, please advise.   Ordered by [facility], she is awaiting call for results IMPRESSION: Abnormal MRI scan of the brain showing multiple periventricular, periatrial and pericallosal and brainstem and cranial medullary junction white matter hyperintensity is suspicious for demyelinating disease. Repeat MRI scan with contrast and dedicated cervical spine imaging may be beneficial

## 2013-09-25 NOTE — Telephone Encounter (Signed)
I tried to call the above number a couple of times but did not leave a message as the voicemail box did not state her name. Please advise patient that I would like to followup on the brain MRI of additional MRI testing to include her cervical spine MRI with and without contrast and her brain MRI with contrast. I would like to discuss this with her directly but in essence there were abnormalities on the brain MRI which need to be delineated further. Further testing with additional MRIs of her upper spine and her brain with contrast would help. I am going to go ahead and request these tests. I will try calling her again. Please also try to call and notify patient.

## 2013-09-26 NOTE — Telephone Encounter (Signed)
[facility] has since contacted patient.

## 2013-09-30 ENCOUNTER — Other Ambulatory Visit: Payer: Self-pay | Admitting: Physician Assistant

## 2013-10-02 ENCOUNTER — Telehealth: Payer: Self-pay

## 2013-10-02 NOTE — Telephone Encounter (Signed)
Is it okay to release this to my chart?

## 2013-10-02 NOTE — Telephone Encounter (Signed)
PT STATES SHE WENT INTO MYCHART BUT DOESN'T UNDERSTAND HOW TO READ THE MRI REPORT. NEED HELP READING IT PLEASE CALL [phone]

## 2013-10-03 ENCOUNTER — Other Ambulatory Visit: Payer: Self-pay | Admitting: Physician Assistant

## 2013-10-06 ENCOUNTER — Telehealth: Payer: Self-pay

## 2013-10-06 ENCOUNTER — Encounter: Payer: Self-pay | Admitting: Radiology

## 2013-10-06 ENCOUNTER — Other Ambulatory Visit: Payer: Self-pay | Admitting: Physician Assistant

## 2013-10-06 NOTE — Telephone Encounter (Signed)
Please call this patient's daughter.  I am extremely concerned that she doesn't remember having reviewed the MRI already-a month ago. She is to have another MRI of the brain, and one of her neck (ordered by Dr. Rexene Alberts on 12/22) to further evaluate the abnormalities seen on the initial scan on 12/18).  Please encourage the daughter to help persuade the patient to come in for evaluation.

## 2013-10-06 NOTE — Telephone Encounter (Signed)
Patient came to appointment center to find out her results of her MRI. Appointment center sent her to walk in center to speak with Amy. Patient did not want to sign in to be seen/discuss her issues with a provider. Please let patient know results of MRI.  [phone]

## 2013-10-06 NOTE — Telephone Encounter (Signed)
Patient called back she states she has heard nothing from the Neurologists. I have advised her I have documentation they have reviewed this with her. She states she is exactly the same and is not sure why she is not improving. I told her you would be happy to discuss this with her, but it would be best for her to come in. She states she understands. I have provided times you are here, and transferred her to make apptt, she does not want to come in to the urgent care.

## 2013-10-06 NOTE — Telephone Encounter (Signed)
Thanks. I have called patient, left message for her to call back, want to further encourage her to come in for this. The number listed for her daughter is not a valid number, I have been unsuccessful trying to reach daughter in the past also.

## 2013-10-06 NOTE — Telephone Encounter (Signed)
I encouraged patient to check in to be seen, results discussed with her by Neurology, unfortunately she does not remember the conversation, I am forwarding this to Neurology and to you. I have advised her to come in.

## 2013-10-09 ENCOUNTER — Telehealth: Payer: Self-pay | Admitting: Neurology

## 2013-10-09 NOTE — Telephone Encounter (Signed)
Please advise 

## 2013-10-09 NOTE — Telephone Encounter (Signed)
Pt stated that she is supposed to have an MRI w/contrast.  Was told that the first MRI came back as abnormal, and she doesn't understand what abnormal means.

## 2013-10-10 NOTE — Telephone Encounter (Signed)
I tried the above number but was not able to leave a detailed message as it was a Designer, television/film set. I basically stated that I wanted to talk to her about adding another MRI test. Please try calling Patient back:  Essentially the brain MRI showed multiple white spots and they are quite extensive. These white spots can be seen in multiple different underlying problems or diseases but one of the possibilities includes multiple sclerosis. For that reason I would like to followup with additional MRI scanning to include a brain MRI with contrast and a cervical spine MRI with and without contrast to get more clarification as to what these abnormalities are. Again these are abnormalities that are nonspecific to 1 disease but we need further workup and more imaging testing as of right now.

## 2013-10-11 ENCOUNTER — Ambulatory Visit (INDEPENDENT_AMBULATORY_CARE_PROVIDER_SITE_OTHER): Payer: Federal, State, Local not specified - PPO

## 2013-10-11 DIAGNOSIS — R9089 Other abnormal findings on diagnostic imaging of central nervous system: Secondary | ICD-10-CM

## 2013-10-11 DIAGNOSIS — R5381 Other malaise: Secondary | ICD-10-CM

## 2013-10-11 DIAGNOSIS — R2689 Other abnormalities of gait and mobility: Secondary | ICD-10-CM

## 2013-10-11 DIAGNOSIS — R5383 Other fatigue: Secondary | ICD-10-CM

## 2013-10-11 DIAGNOSIS — M62838 Other muscle spasm: Secondary | ICD-10-CM

## 2013-10-11 DIAGNOSIS — R531 Weakness: Secondary | ICD-10-CM

## 2013-10-11 DIAGNOSIS — R930 Abnormal findings on diagnostic imaging of skull and head, not elsewhere classified: Secondary | ICD-10-CM

## 2013-10-11 DIAGNOSIS — R29818 Other symptoms and signs involving the nervous system: Secondary | ICD-10-CM

## 2013-10-11 NOTE — Telephone Encounter (Signed)
I called pt and she has appt today for MRI brain w contrast and MRI Cspine w/wo.  She had been given results for her MRI brain w/o constrast.   per Enid Baas, RN previously.

## 2013-10-12 MED ORDER — GADOPENTETATE DIMEGLUMINE 469.01 MG/ML IV SOLN: 13.0000 mL | Freq: Once | INTRAVENOUS | Status: AC | PRN

## 2013-10-13 ENCOUNTER — Telehealth: Payer: Self-pay | Admitting: *Deleted

## 2013-10-13 DIAGNOSIS — G35 Multiple sclerosis: Secondary | ICD-10-CM

## 2013-10-13 NOTE — Telephone Encounter (Signed)
Please advise 

## 2013-10-13 NOTE — Telephone Encounter (Signed)
I called patient. The MRI was done a day or 2 ago, the results are not yet available. I indicated to the patient that the report will likely be available sometime this weekend. This is actually a patient of Dr. Rexene Alberts, and I will forward this message to her.

## 2013-10-16 NOTE — Addendum Note (Signed)
Addended by: [name] on: 10/16/2013 05:31 PM   Modules accepted: Orders

## 2013-10-16 NOTE — Telephone Encounter (Signed)
I had a very long conversation on the phone with this patient today. Her symptoms actually have somewhat improved in that her balance is better. I explained to her that when I first met her earlier last month her history and exam were confounded by some medication changes and perhaps drinking more beer than she is used to. I explained to her that I certainly did not mean to label her as a drinker or smoker or overdoing medication. She completely understood and even though we got off to a rough start during that visit she understands that and even apologizes that during that visit she felt "off". I told her that she did not have to apologize. I also told her that it was probably a good thing that we ordered a brain MRI to followup on the head CT which was reported as abnormal. In hindsight, she has had symptoms of MS dating back to 2001 earlier. 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 She was told at that time that she had abnormal spots on her brain. She had an attempted LP but could not go through with it at she had severe radiating back pain at the time and she was not able to complete testing and did not pursue further workup or treatment for MS. She did not tell me this at the time of her visit in December. What she did tell me is that her brother had a mass. She says that she is exhibiting a lot of his symptoms he had and he died in 2003 from what I understand. I explained the MRI test results to her as well as the most recent MRI brain with contrast and cervical spine MRI findings, which are highly concerning for active MS. In fact, I explained to her that she may be somebody that needs aggressive treatment, most likely in the form of Tysabri. I discussed the case and looked up the scans along with Dr. Yan in our office and I explained to her that Dr. Yan has much more experience with treating MS patients. Dr. Yan has kindly agreed to start seeing this patient treating her and discussing treatment  options with the patient. If we can also pursue a second opinion down the Road with one of the MS centers in the area which would mean seeing somebody at an academic institution such [facility]. The patient was agreeable to coming to our office to see Dr. Yan for now. At this juncture, we need to do more testing in the form of spinal fluid testing, visual evoked potentials, JC virus antibody test and NMO antibodies in spinal fluid and serum. I explained these tests to her and she was willing to embark on all of this. She is advised that we will call her for her visual evoked potential testing in our office, at which time she can have the blood work done. Her spinal fluid testing will be arranged through outpatient radiology. She is advised that she she does not hear back from us and radiology within this week she should call us back. She was in agreement. 

## 2013-10-17 ENCOUNTER — Telehealth: Payer: Self-pay | Admitting: Neurology

## 2013-10-17 NOTE — Telephone Encounter (Signed)
Wanda [name]:  Give her a follow up appt after fluro-guided LP.

## 2013-10-17 NOTE — Telephone Encounter (Signed)
Patient has been scheduled. For fu Apt.

## 2013-10-18 NOTE — Progress Notes (Signed)
Quick Note:  I have discussed these abnormal C-spine and brain MRI imaging findings at length with the patient on the phone on 10/16/2013. We are going to do further workup in the form of spinal fluid testing, blood work, and visual evoked potentials. Furthermore, I have discussed with the patient that she follow with Dr. Krista Blue in our office who has expertise in caring for MS patients. The patient is advised that she more than likely has active multiple sclerosis. In further questioning the patient, it looks like symptoms may have dated back to the early 2000. She recalls having had vision loss in 2001 and was in the process of being worked up for [facility] but never pursued full workup and treatment. Her brother had MS.the patient is advised that she will be scheduled for followup soon with Dr. Krista Blue who has kindly agreed to take over her care. Down the road, if the patient so wishes, she can also be referred to a larger MS treatment center in the area. The patient was in agreement and understood.  [name], MD, PhD [facility]  ______

## 2013-10-19 ENCOUNTER — Other Ambulatory Visit: Payer: Self-pay | Admitting: Neurology

## 2013-10-19 ENCOUNTER — Ambulatory Visit
Admission: RE | Admit: 2013-10-19 | Discharge: 2013-10-19 | Disposition: A | Discharge status: Home / Self Care | Payer: Federal, State, Local not specified - PPO | Source: Ambulatory Visit | Attending: Neurology | Admitting: Neurology

## 2013-10-19 VITALS — BP 115/76 | HR 80

## 2013-10-19 DIAGNOSIS — N879 Dysplasia of cervix uteri, unspecified: Secondary | ICD-10-CM

## 2013-10-19 DIAGNOSIS — G4700 Insomnia, unspecified: Secondary | ICD-10-CM

## 2013-10-19 DIAGNOSIS — D219 Benign neoplasm of connective and other soft tissue, unspecified: Secondary | ICD-10-CM

## 2013-10-19 DIAGNOSIS — E7800 Pure hypercholesterolemia, unspecified: Secondary | ICD-10-CM

## 2013-10-19 DIAGNOSIS — G35 Multiple sclerosis: Secondary | ICD-10-CM

## 2013-10-19 DIAGNOSIS — M1990 Unspecified osteoarthritis, unspecified site: Secondary | ICD-10-CM

## 2013-10-19 DIAGNOSIS — I10 Essential (primary) hypertension: Secondary | ICD-10-CM

## 2013-10-19 LAB — CSF CELL COUNT WITH DIFFERENTIAL
RBC Count, CSF: 0 cu mm
Tube #: 4
WBC, CSF: 1 cu mm (ref 0–5)

## 2013-10-19 LAB — GLUCOSE, CSF: GLUCOSE CSF: 59 mg/dL (ref 43–76)

## 2013-10-19 LAB — GRAM STAIN: GRAM STAIN: NONE SEEN

## 2013-10-19 LAB — PROTEIN, CSF: Total Protein, CSF: 43 mg/dL (ref 15–45)

## 2013-10-19 MED ORDER — DIAZEPAM 5 MG PO TABS
10.0000 mg | ORAL_TABLET | Freq: Once | ORAL | Status: AC
  Administered 2013-10-19: 10 mg via ORAL

## 2013-10-19 NOTE — Progress Notes (Signed)
Dr. Gerilyn Pilgrim in to speak with patient and her daughter before procedure; consent obtained.  jkl

## 2013-10-19 NOTE — Discharge Instructions (Signed)
Lumbar Puncture Discharge Instructions  1. Go home and rest quietly for the next 24 hours.  It is important to lie flat for the next 24 hours.  Get up only to go to the restroom.  You may lie in the bed or on a couch on your back, your stomach, your left side or your right side.  You may have one pillow under your head.  You may have pillows between your knees while you are on your side or under your knees while you are on your back.  2. DO NOT drive today.  Recline the seat as far back as it will go, while still wearing your seat belt, on the way home.  3. You may get up to go to the bathroom as needed.  You may sit up for 10 minutes to eat.  You may resume your normal diet and medications unless otherwise indicated.  Drink plenty of extra fluids today and tomorrow.  4. The incidence of a spinal headache with nausea and/or vomiting is about 5% (one in 20 patients).  If you develop a headache, lie flat and drink plenty of fluids until the headache goes away.  Caffeinated beverages may be helpful.  If you develop severe nausea and vomiting or a headache that does not go away with flat bed rest, call [phone].  5. You may resume normal activities after your 24 hours of bed rest is over; however, do not exert yourself strongly or do any heavy lifting tomorrow.  Call your physician for a follow-up appointment. Myelogram Discharge Instructions  Go home and rest quietly for the next 24 hours.  It is important to lie flat for the next 24 hours.  Get up only to go to the restroom.  You may lie in the bed or on a couch on your back, your stomach, your left side or your right side.  You may have one pillow under your head.  You may have pillows between your knees while you are on your side or under your knees while you are on your back.  DO NOT drive today.  Recline the seat as far back as it will go, while still wearing your seat belt, on the way home.  You may get up to go to the bathroom as needed.   You may sit up for 10 minutes to eat.  You may resume your normal diet and medications unless otherwise indicated.  Drink plenty of extra fluids today and tomorrow.  The incidence of a spinal headache with nausea and/or vomiting is about 5% (one in 20 patients).  If you develop a headache, lie flat and drink plenty of fluids until the headache goes away.  Caffeinated beverages may be helpful.  If you develop severe nausea and vomiting or a headache that does not go away with flat bed rest, call [phone].  You may resume normal activities after your 24 hours of bed rest is over; however, do not exert yourself strongly or do any heavy lifting tomorrow.  6. Call your physician for a follow-up appointment.

## 2013-10-19 NOTE — Progress Notes (Signed)
Two tiger-topped tubes of blood drawn for procedure from left AC space; site unremarkable.  jkl

## 2013-10-20 LAB — CSF CELL COUNT WITH DIFFERENTIAL
RBC Count, CSF: 6 cu mm — ABNORMAL HIGH
Tube #: 1
WBC, CSF: 0 cu mm (ref 0–5)

## 2013-10-20 LAB — ANGIOTENSIN CONVERTING ENZYME, CSF: ACE, CSF: 8 U/L (ref <=15)

## 2013-10-21 LAB — MULTIPLE SCLEROSIS PANEL 2
Albumin CSF: 25 mg/dL (ref <35)
Albumin Index: 6.1 (ref <9.0)
Albumin: 4110 mg/dL (ref 3700–5410)
CNS-IgG Synthesis Rate: 2 mg/24hr (ref <3.3)
IGG TOTAL: 1276 mg/dL (ref 694–1618)
IgA CSF: 0.16 mg/dL (ref 0.15–0.60)
IgA MSPROF: <0.001 mg/dL (ref <0.010)
IgA Total: 81 mg/dL (ref 81–463)
IgG Total CSF: 4.8 mg/dL (ref 0.5–6.1)
IgG-Index: 0.62 (ref <0.70)
IgG: <0.01 mg/dL (ref <0.10)
IgM MSPROF: <0.001
IgM Total: 42 mg/dL — ABNORMAL LOW (ref 48–271)
IgM-CSF: <0.03 mg/dL (ref <0.10)

## 2013-10-23 LAB — MYELIN BASIC PROTEIN, CSF: Myelin Basic Protein: <2 mcg/L (ref 0.0–4.0)

## 2013-10-24 LAB — OLIGOCLONAL BANDS, CSF + SERM

## 2013-10-25 ENCOUNTER — Ambulatory Visit (INDEPENDENT_AMBULATORY_CARE_PROVIDER_SITE_OTHER): Payer: Federal, State, Local not specified - PPO | Admitting: Physician Assistant

## 2013-10-25 ENCOUNTER — Encounter: Payer: Self-pay | Admitting: Physician Assistant

## 2013-10-25 VITALS — BP 132/84 | HR 97 | Temp 98.3°F | Resp 17 | Ht 60.5 in | Wt 127.0 lb

## 2013-10-25 DIAGNOSIS — G4700 Insomnia, unspecified: Secondary | ICD-10-CM

## 2013-10-25 DIAGNOSIS — F17200 Nicotine dependence, unspecified, uncomplicated: Secondary | ICD-10-CM

## 2013-10-25 DIAGNOSIS — Z720 Tobacco use: Secondary | ICD-10-CM

## 2013-10-25 DIAGNOSIS — G35 Multiple sclerosis: Secondary | ICD-10-CM

## 2013-10-25 LAB — NEUROMYELITIS OPTICA AUTOAB, IGG: NMO-IGG: NEGATIVE

## 2013-10-25 LAB — HSV(HERPES SMPLX VRS)ABS-I+II(IGG)-CSF

## 2013-10-25 MED ORDER — ZOLPIDEM TARTRATE 10 MG PO TABS: ORAL_TABLET | ORAL | Status: DC

## 2013-10-25 MED ORDER — TRAZODONE HCL 50 MG PO TABS: ORAL_TABLET | ORAL | Status: DC

## 2013-10-25 MED ORDER — VARENICLINE TARTRATE 1 MG PO TABS: 1.0000 mg | ORAL_TABLET | Freq: Two times a day (BID) | ORAL | Status: DC

## 2013-10-25 NOTE — Progress Notes (Signed)
   Subjective:    Patient ID: Wanda [name], female    DOB: 1951-06-21, 63 y.o.   MRN: [id number]  HPI  Patient presents to clinic today for prescription refills. She has been unable to sleep since she ran out of her prescriptions 2 weeks ago. Has been taking 8 advil pm per night with no improvement in sleep. Has no energy beginning 08/11/13 - stays in the bed all of the time. Yesterday was the first day she was able to do everyday activities (vaccuming, washing dishes). Anxiety has worsened since she has run out of trazodone.Reports 15 lb weight loss that she attributes to not being able to prepare meals because of pain in hands.   Her hands have been causing her pain since 11/14 due to [place] which has made it very difficult to do anything. Reports improvement in hand symptoms over the past few days. Feels she will be able to better prepare meals for herself with improvement of hand pain.   Started smoking again 11/14 - would like to restart Chantix.   Still taking blood pressure medication and hyperlipidemia medications as prescribed.   Had spinal tap 10/19/13 at [facility] ordered by neurologist. Has follow up with neurologist 11/07/13.   Review of Systems As above.     Objective:   Physical Exam  Constitutional: She is oriented to person, place, and time. She appears well-developed and well-nourished.  HENT:  Head: Normocephalic and atraumatic.  Cardiovascular: Normal rate, regular rhythm and normal heart sounds.   Pulmonary/Chest: Effort normal and breath sounds normal.  Neurological: She is alert and oriented to person, place, and time. She has normal strength. No sensory deficit.  Skin: Skin is warm and dry.  Psychiatric: She has a normal mood and affect. Her behavior is normal. Judgment and thought content normal.       Assessment & Plan:   1. Insomnia Take trazodone at bedtime as needed for sleep. Take Ambien at bedtime as needed for sleep. Return to office if no  improvement in insomnia.  - traZODone (DESYREL) 50 MG tablet; TAKE 1/2 TO 1 TABLET AT BEDTIME AS NEEDED FOR SLEEP.  Dispense: 30 tablet; Refill: 2 - zolpidem (AMBIEN) 10 MG tablet; take 1 tablet by mouth at bedtime if needed  Dispense: 30 tablet; Refill: 0  2. Multiple sclerosis Follow up appointment with neurologist on 11/07/13.   3. Tobacco abuse Start Chantix 1/2 tablet daily x 4 days, then 1/2 tablet daily BID x 4 days, then 1/2 tablet in the morning and 1 tablet in the evening x 4 days, then increase to 1 tablet each day.  - varenicline (CHANTIX) 1 MG tablet; Take 1 tablet (1 mg total) by mouth 2 (two) times daily.  Dispense: 60 tablet; Refill: 5

## 2013-10-25 NOTE — Patient Instructions (Signed)
When you start back on the Chantix, start by taking 1/2 tablet each day for 4 days, then take 1/2 tablet twice each day for 4 days, then take 1/2 tablet in the morning and 1 tablet each evening for 4 days, then increase to 1 tablet each day.

## 2013-10-25 NOTE — Progress Notes (Signed)
I have examined this patient along with the student and agree.  This is my first visit with this patient since her diagnosis of MS. She needs a medication refill so that she can sleep.  Her energy level is very low. Quit smoking with Chantix, but then started smoking again during her work up for weakness and memory problems which ultimately revealed probable MS.

## 2013-10-30 LAB — VIRAL CULTURE VIRC: ORGANISM ID, BACTERIA: NEGATIVE

## 2013-11-07 ENCOUNTER — Ambulatory Visit (INDEPENDENT_AMBULATORY_CARE_PROVIDER_SITE_OTHER): Payer: Federal, State, Local not specified - PPO | Admitting: Neurology

## 2013-11-07 ENCOUNTER — Encounter: Payer: Self-pay | Admitting: Neurology

## 2013-11-07 ENCOUNTER — Ambulatory Visit (INDEPENDENT_AMBULATORY_CARE_PROVIDER_SITE_OTHER): Payer: Federal, State, Local not specified - PPO

## 2013-11-07 VITALS — BP 130/89 | HR 75 | Ht 59.0 in | Wt 128.0 lb

## 2013-11-07 DIAGNOSIS — G35 Multiple sclerosis: Secondary | ICD-10-CM

## 2013-11-07 NOTE — Patient Instructions (Signed)
[email or web address]  Wanda [name]  #1 [place]  #2 [place] #2 [place]  #3

## 2013-11-07 NOTE — Progress Notes (Signed)
PATIENT: Wanda [name] DOB: 1951-05-31  HISTORICAL   INITIAL VISIT In FEB 2015  Wanda [name]   The patient is accompanied by her daughter, Wanda [name], she was initially evaluated by Wanda [name] in December 2nd 2014, referred by her primary care Wanda.Jeffrey  Wanda [name] is a 63 years old right-handed African American female, with past medical history of hypertension, hyperlipidemia, longtime smoker  She presents with acute onset of slurred speech, vertigo, since November7th 2014, also has gait difficulty,  CAT scan of the brain without contrast in November 2014 showed moderate for age nonspecific cerebral white matter changes  She was also referred for the brain MRI showed multiple scattered , discrete and confluent lesions at periventricular, periatrial, peri-callosal brainstem, cranial medullary junction and upper cervical spine white matter hyperintensities on T2/flair.  Enhancing lesions in the left paramedian pons. Left posterior frontal and left parietal and tiny right frontal periventricular white matter which likely represent active demarcating plaques. multiple T1 black holes noted in the periventricular and subcortical white matter. There is mild general cerebral atrophy.  MRI scan of the cervical spine showed spinal cord hyperintensities atC2 and C5 likely represent demyelinating plaques. Contrast images show actively enhancing lesion at C2.  Laboratory evaluation showed normal or negative NMO antibody, A1c 5. 9, ANA, CPK, E55, folic acid, vitamin B1, ESR, TSH, RPR, CMP, , CBC,  CSF study show oligoclonal banding four,, normal meyelin basic protein. ACE was normal 8,   WBC 0, RBC 6, glucose 59, total protein 43,   She moved to [place] around 2010, In 2001, she had one episode of sudden right visual loss, reported extensive evaluation at that time, was diagnosed with right "eye problem"have received the by mouth steroid, intraocular steroid injection, per patient MRI of the brain  was done at that time too.  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 atorvastatin (LIPITOR) 20 MG tablet Take 1 tablet (20 mg total) by mouth daily.  90 tablet  3  . estradiol (ESTRACE) 0.5 MG tablet Take 1 tablet (0.5 mg total) by mouth daily.  90 tablet  3  . glucosamine-chondroitin 500-400 MG tablet Take 1 tablet by mouth 2 (two) times daily with a meal.      . hydrochlorothiazide (HYDRODIURIL) 25 MG tablet take 1 tablet by mouth once daily  90 tablet  1  . medroxyPROGESTERone (PROVERA) 2.5 MG tablet Take 1 tablet (2.5 mg total) by mouth daily.  90 tablet  4  . meloxicam (MOBIC) 15 MG tablet take 1 tablet by mouth once daily  90 tablet  1  . traZODone (DESYREL) 50 MG tablet TAKE 1/2 TO 1 TABLET AT BEDTIME AS NEEDED FOR SLEEP.  30 tablet  2  . varenicline (CHANTIX) 1 MG tablet Take 1 tablet (1 mg total) by mouth 2 (  two) times daily.  60 tablet  5  . zolpidem (AMBIEN) 10 MG tablet take 1 tablet by mouth at bedtime if needed  30 tablet  0     PAST MEDICAL HISTORY: Past Medical History  Diagnosis Date  . Elevated cholesterol   . Hypertension   . Arthritis   . Cervical dysplasia   . Depression   . Anxiety   . Macular degeneration of right eye 2001    PAST SURGICAL HISTORY: Past Surgical  History  Procedure Laterality Date  . Colposcopy    . Knee surgery    . Cholecystectomy    . Tubal ligation      FAMILY HISTORY: Family History  Problem Relation Age of Onset  . Cancer Mother     Colon  . Diabetes Mother   . Hypertension Mother   . Arthritis Mother   . Cancer Father     prostate  . Kidney disease Brother     congenital single kidney  . Arthritis Sister   . Arthritis Sister   . Hematuria Son   . Gout Brother   . Multiple sclerosis Brother   . Arthritis Brother   . HIV Brother   . Cancer Brother     spinal    SOCIAL HISTORY:  History   Social History  . Marital Status: Widowed    Spouse Name: N/A    Number of Children: 2  . Years of Education: college   Occupational History  . retired     Social History Main Topics  . Smoking status: Current Some Day Smoker -- 1.00 packs/day for 45 years    Types: Cigarettes    Last Attempt to Quit: 01/21/2013  . Smokeless tobacco: Never Used  . Alcohol Use: 3.6 oz/week    6 Cans of beer per week  . Drug Use: No  . Sexual Activity: No   Other Topics Concern  . Not on file   Social History Narrative   Grew up in [place] area, 1 of 10 siblings - 7 still living, married for 33 years then divorced, moved to [place] to be near daughter post retirement, als has 1 son. 2 dogs.       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     PHYSICAL EXAM   Filed Vitals:   11/07/13 0942  BP: 130/89  Pulse: 75  Height: _0  (1.499 m)  Weight: 128 lb (58.06 kg)    Not recorded    Body mass index is 25.84 kg/(m^2).   Generalized: In no acute distress  Neck: Supple, no carotid bruits   Cardiac: Regular rate rhythm  Pulmonary: Clear to auscultation bilaterally  Musculoskeletal: No deformity  Neurological examination  Mentation: Alert oriented to time, place, history taking, and causual conversation  Cranial nerve II-XII: Pupils were equal round reactive to light extraocular  movements were full, Visual field were full on confrontational test. Bilateral fundi were sharp.  Facial sensation and strength were normal. Hearing was intact to finger rubbing bilaterally. Uvula tongue midline.  head turning and shoulder shrug and were normal and symmetric.Tongue protrusion into cheek strength was normal.  Motor: normal tone, bulk and strength.  Sensory: Intact to fine touch, pinprick, preserved vibratory sensation, and proprioception at toes.  Coordination: she has mild trunk ataxia, moderate left finger to nose, left heel-to-shin dysmetria  Gait:  right base, cautious, mildly unsteady gait   Romberg signs: Positive  Deep tendon reflexes: Brachioradialis 2/2,  biceps 2/2, triceps 2/2, patellar 2/2, Achilles 2/2, plantar responses were flexor bilaterally.   DIAGNOSTIC DATA (LABS, IMAGING, TESTING) - I reviewed patient records, labs, notes, testing and imaging myself where available.  Lab Results  Component Value Date   WBC 5.3 12/29/2012   HGB 14.2 12/29/2012   HCT 42.0 12/29/2012   MCV 86.4 12/29/2012   PLT 242 12/29/2012      Component Value Date/Time   NA 141 06/29/2013 1501   K 3.7 06/29/2013 1501   CL 104 06/29/2013 1501   CO2 25 06/29/2013 1501   GLUCOSE 85 06/29/2013 1501   BUN 11 06/29/2013 1501   CREATININE 0.89 06/29/2013 1501   CREATININE 0.77 04/21/2011 0901   CALCIUM 10.0 06/29/2013 1501   PROT 8.3 06/29/2013 1501   ALBUMIN 4.7 06/29/2013 1501   AST 16 06/29/2013 1501   ALT 18 06/29/2013 1501   ALKPHOS 82 06/29/2013 1501   BILITOT 0.4 06/29/2013 1501   GFRNONAA >60 04/21/2011 0901   GFRAA >60 04/21/2011 0901   Lab Results  Component Value Date   CHOL 168 06/29/2013   HDL 70 06/29/2013   LDLCALC 85 06/29/2013   TRIG 63 06/29/2013   CHOLHDL 2.4 06/29/2013   Lab Results  Component Value Date   HGBA1C 5.9* 09/05/2013   Lab Results  Component Value Date   VITAMINB12 547 09/05/2013   Lab Results  Component Value Date   TSH 0.463 09/05/2013    ASSESSMENT  AND PLAN   63 years old [place] American female, with possible previous history of optic neuritis, now presenting with acute onset of unbalanced gait, gait difficulty, marked abnormal MRI of the brain, and cervical, detailed above, consistent with relapsing remitting multiple sclerosis .  1 will complete evaluation with JC virus antibody, varicella-zoster antibody, and the laboratory evaluation to rule out mimics. 2.  return to clinic in 2-3 weeks to make treatment decisions, options provided, including [facility], [place], Tecfidera, Plegridy 3 physical therapy, 4 ophthalmology evaluation,.  .    I spent 60 minutes in reviewing tere film together with patient, coordinating her care,    Marcial Pacas, M.D. Ph.D.  [facility] [address], [place], [place] [address] [phone]

## 2013-11-07 NOTE — Procedures (Signed)
    History:   Wanda [name] is a 63 year old patient with a history of left-sided weakness, dizziness, and slurred speech beginning on 08/11/2013. The patient had a history in 2001 of blurring of vision involving the right eye. The patient is being evaluated for possible demyelinating disease at this time.  Description: The visual evoked response test was performed today using 32 x 32 check sizes. The absolute latencies for the N1 and the P100 wave forms were significantly prolonged bilaterally. The amplitudes for the P100 wave forms were within normal limits bilaterally. The visual acuity was 20/200 OD and 20/30 OS corrected.  Impression:  The visual evoked response test above was abnormal bilaterally. Significant prolongation of the P100 latencies and the N1 latencies were seen bilaterally. This study suggests the presence of bilateral lesions (ischemic or demyelinating) within the optic nerves, or a single lesion involving the optic chiasm. This study would be consistent with an optic neuritis, clinical correlation is required.

## 2013-11-08 LAB — VARICELLA ZOSTER ANTIBODY, IGG: Varicella zoster IgG: >4000 index (ref >165)

## 2013-11-08 LAB — HEPATITIS PANEL, ACUTE
HEP B C IGM: NEGATIVE
Hep A IgM: NEGATIVE
Hep C Virus Ab: <0.1 s/co ratio (ref 0.0–0.9)
Hepatitis B Surface Ag: NEGATIVE

## 2013-11-08 LAB — LYME, TOTAL AB TEST/REFLEX: Lyme IgG/IgM Ab: <0.91 {ISR} (ref 0.00–0.90)

## 2013-11-16 LAB — FUNGUS CULTURE W SMEAR: Smear Result: NONE SEEN

## 2013-11-18 ENCOUNTER — Other Ambulatory Visit: Payer: Self-pay | Admitting: Physician Assistant

## 2013-11-20 NOTE — Telephone Encounter (Signed)
faxed

## 2013-11-28 ENCOUNTER — Ambulatory Visit (INDEPENDENT_AMBULATORY_CARE_PROVIDER_SITE_OTHER): Payer: Federal, State, Local not specified - PPO | Admitting: Neurology

## 2013-11-28 ENCOUNTER — Encounter: Payer: Self-pay | Admitting: Physician Assistant

## 2013-11-28 ENCOUNTER — Encounter: Payer: Self-pay | Admitting: Neurology

## 2013-11-28 VITALS — BP 126/82 | HR 83 | Ht 59.0 in | Wt 126.0 lb

## 2013-11-28 DIAGNOSIS — G35 Multiple sclerosis: Secondary | ICD-10-CM

## 2013-11-28 MED ORDER — MELOXICAM 15 MG PO TABS: 15.0000 mg | ORAL_TABLET | Freq: Every day | ORAL | Status: DC

## 2013-11-28 NOTE — Progress Notes (Signed)
PATIENT: Wanda [name] DOB: 31-Aug-1951  HISTORICAL   INITIAL VISIT In FEB 2015  Wanda [name] is accompanied by her daughter, Wanda [name], she was initially evaluated by Dr Rexene Alberts in December 2nd 2014, referred by her primary care Dr.Jeffrey  [name] is a 62 years old right-handed African American female, with past medical history of hypertension, hyperlipidemia, longtime smoker  She presents with acute onset of slurred speech, vertigo, since November7th 2014, also has gait difficulty,  CAT scan of the brain without contrast in November 2014 showed moderate for age nonspecific cerebral white matter changes  She was also referred for the brain MRI showed multiple scattered , discrete and confluent lesions at periventricular, periatrial, peri-callosal brainstem, cranial medullary junction and upper cervical spine white matter hyperintensities on T2/flair.  Enhancing lesions in the left paramedian pons. Left posterior frontal and left parietal and tiny right frontal periventricular white matter which likely represent active demarcating plaques. multiple T1 black holes noted in the periventricular and subcortical white matter. There is mild general cerebral atrophy.  MRI scan of the cervical spine showed spinal cord hyperintensities atC2 and C5 likely represent demyelinating plaques. Contrast images show actively enhancing lesion at C2.  Laboratory evaluation showed normal or negative NMO antibody, A1c 5. 9, ANA, CPK, A45, folic acid, vitamin B1, ESR, TSH, RPR, CMP, , CBC,  CSF study show oligoclonal banding four,, normal meyelin basic protein. ACE was normal 8,   WBC 0, RBC 6, glucose 59, total protein 43,   She moved to [place] around 2010, In 2001, she had one episode of sudden right visual loss, reported extensive evaluation at that time, was diagnosed with right "eye problem"have received the by mouth steroid, intraocular steroid injection, per patient MRI of the brain was done at  that time too.  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  JC virus titer was positive, 0.66, we have reviewed her MRI of brain, and cervical scan again, findings are consistent with an active relapsing remitting multiple sclerosis,  After discuss with patient, and her daughter, we decided to proceed with Tysarbri IV infusion,  she understands the potential risks, including PML infection, but with her low JC virus  titer, she has a less than 0. 10/998 chance of getting PML in the first 24 months, we are monitoring her JC virus antibody titer every 3 months, repeating MRI of brain every to 6 months.  She also agrees to enroll in baclofen extended release trial, because she continues to have bilateral upper and lower extremity muscle spasticity.   REVIEW OF SYSTEMS: Full 14 system review of systems performed and notable only for activity change, fatigue, insomnia, frequency of urination, urgency, low back pain, muscle cramps, walking difficulty, coordination problem, dizziness, numbness, weakness   ALLERGIES: No Known Allergies  HOME MEDICATIONS: Outpatient Prescriptions Prior to Visit  Medication Sig Dispense Refill  . atorvastatin (LIPITOR) 20 MG tablet Take 1 tablet (20 mg total) by mouth daily.  90 tablet  3  .  estradiol (ESTRACE) 0.5 MG tablet Take 1 tablet (0.5 mg total) by mouth daily.  90 tablet  3  . glucosamine-chondroitin 500-400 MG tablet Take 1 tablet by mouth 2 (two) times daily with a meal.      . hydrochlorothiazide  (HYDRODIURIL) 25 MG tablet take 1 tablet by mouth once daily  90 tablet  1  . medroxyPROGESTERone (PROVERA) 2.5 MG tablet Take 1 tablet (2.5 mg total) by mouth daily.  90 tablet  4  . meloxicam (MOBIC) 15 MG tablet take 1 tablet by mouth once daily  90 tablet  1  . traZODone (DESYREL) 50 MG tablet TAKE 1/2 TO 1 TABLET AT BEDTIME AS NEEDED FOR SLEEP.  30 tablet  2  . varenicline (CHANTIX) 1 MG tablet Take 1 tablet (1 mg total) by mouth 2 (two) times daily.  60 tablet  5  . zolpidem (AMBIEN) 10 MG tablet take 1 tablet by mouth at bedtime if needed  30 tablet  0     PAST MEDICAL HISTORY: Past Medical History  Diagnosis Date  . Elevated cholesterol   . Hypertension   . Arthritis   . Cervical dysplasia   . Depression   . Anxiety   . Macular degeneration of right eye 2001    PAST SURGICAL HISTORY: Past Surgical History  Procedure Laterality Date  . Colposcopy    . Knee surgery    . Cholecystectomy    . Tubal ligation      FAMILY HISTORY: Family History  Problem Relation Age of Onset  . Cancer Mother     Colon  . Diabetes Mother   . Hypertension Mother   . Arthritis Mother   . Cancer Father     prostate  . Kidney disease Brother     congenital single kidney  . Arthritis Sister   . Arthritis Sister   . Hematuria Son   . Gout Brother   . Multiple sclerosis Brother   . Arthritis Brother   . HIV Brother   . Cancer Brother     spinal    SOCIAL HISTORY:  History   Social History  . Marital Status: Widowed    Spouse Name: N/A    Number of Children: 2  . Years of Education: college   Occupational History  . retired     Social History Main Topics  . Smoking status: Current Some Day Smoker -- 1.00 packs/day for 45 years    Types: Cigarettes    Last Attempt to Quit: 01/21/2013  . Smokeless tobacco: Never Used  . Alcohol Use: 3.6 oz/week    6 Cans of beer per week  . Drug Use: No  . Sexual Activity: No   Other Topics Concern  . Not on file   Social History  Narrative   Grew up in [place] area, 1 of 10 siblings - 66 still living, married for 33 years then divorced, moved to [place] to be near daughter post retirement, als has 1 son. 2 dogs.       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     PHYSICAL EXAM   Filed Vitals:   11/28/13 1100  BP: 126/82  Pulse: 83  Height: _0  (1.499 m)  Weight: 126 lb (57.153 kg)    Not recorded    Body mass index is 25.44 kg/(m^2).   Generalized: In no acute distress  Neck: Supple, no carotid bruits   Cardiac: Regular rate rhythm  Pulmonary:  Clear to auscultation bilaterally  Musculoskeletal: No deformity  Neurological examination  Mentation: Alert oriented to time, place, history taking, and causual conversation  Cranial nerve II-XII: Pupils were equal round reactive to light extraocular movements were full, Visual field were full on confrontational test. Bilateral fundi were sharp.  Facial sensation and strength were normal. Hearing was intact to finger rubbing bilaterally. Uvula tongue midline.  head turning and shoulder shrug and were normal and symmetric.Tongue protrusion into cheek strength was normal.  Motor: mild left arm weakness, proximal and distal 4/5.  Sensory: Intact to fine touch, pinprick, preserved vibratory sensation, and proprioception at toes.  Coordination: she has mild trunk ataxia, mild left finger to nose, mild bilateral heel-to-shin dysmetria  Gait:  right base, cautious, mildly unsteady gait   Romberg signs: Positive  Deep tendon reflexes: Brachioradialis 2/2, biceps 2/2, triceps 2/2, patellar 2/2, Achilles 2/2, plantar responses were flexor bilaterally.   DIAGNOSTIC DATA (LABS, IMAGING, TESTING) - I reviewed patient records, labs, notes, testing and imaging myself where available.  Lab Results  Component Value Date   WBC 5.3 12/29/2012   HGB 14.2 12/29/2012   HCT 42.0 12/29/2012   MCV 86.4 12/29/2012   PLT 242 12/29/2012       Component Value Date/Time   NA 141 06/29/2013 1501   K 3.7 06/29/2013 1501   CL 104 06/29/2013 1501   CO2 25 06/29/2013 1501   GLUCOSE 85 06/29/2013 1501   BUN 11 06/29/2013 1501   CREATININE 0.89 06/29/2013 1501   CREATININE 0.77 04/21/2011 0901   CALCIUM 10.0 06/29/2013 1501   PROT 8.3 06/29/2013 1501   ALBUMIN 4.7 06/29/2013 1501   AST 16 06/29/2013 1501   ALT 18 06/29/2013 1501   ALKPHOS 82 06/29/2013 1501   BILITOT 0.4 06/29/2013 1501   GFRNONAA >60 04/21/2011 0901   GFRAA >60 04/21/2011 0901   Lab Results  Component Value Date   CHOL 168 06/29/2013   HDL 70 06/29/2013   LDLCALC 85 06/29/2013   TRIG 63 06/29/2013   CHOLHDL 2.4 06/29/2013   Lab Results  Component Value Date   HGBA1C 5.9* 09/05/2013   Lab Results  Component Value Date   VITAMINB12 547 09/05/2013   Lab Results  Component Value Date   TSH 0.463 09/05/2013    ASSESSMENT AND PLAN   63 years old [place] American female, with previous history of optic neuritis, now presenting with acute onset of unbalanced gait, gait difficulty, marked abnormal MRI of the brain, and cervical, detailed above, consistent with relapsing remitting multiple sclerosis, she has low titer positive JC virus antibody 0.6 6  After discuss with patient, and her daughter, we decided to proceed with Tysarbri IV infusion,  she understands the potential risks, including PML infection, but with her low JC virus  titer, she has a less than 0. 10/998 chance of getting PML in the first 24 months, we are monitoring her JC virus antibody titer every 3 months, repeating MRI of brain every to 6 months.  She also agrees to enroll in baclofen extended release trial, because she continues to have bilateral upper and lower extremity muscle spasticity.    Marcial Pacas, M.D. Ph.D.  [facility] [address] [place], [place] [address] [phone]

## 2013-12-05 ENCOUNTER — Ambulatory Visit: Payer: Federal, State, Local not specified - PPO | Admitting: Neurology

## 2013-12-05 ENCOUNTER — Telehealth: Payer: Self-pay | Admitting: Neurology

## 2013-12-05 NOTE — Telephone Encounter (Signed)
Patient states she is returning your call. Please call patient.

## 2013-12-05 NOTE — Telephone Encounter (Signed)
Is trying to contact patient and dialed home number and was told it was the wrong number please have pt call

## 2013-12-05 NOTE — Telephone Encounter (Signed)
I spoke to patient and relayed that I will call [employer] to see if she has been authorized yet.

## 2013-12-06 ENCOUNTER — Telehealth: Payer: Self-pay | Admitting: Neurology

## 2013-12-06 NOTE — Telephone Encounter (Signed)
Patient called requesting a Rx for Baclofen be sent to her pharmacy.  This drug is not currently on med list.   Last OV says: She also agrees to enroll in baclofen extended release trial, because she continues to have bilateral upper and lower extremity muscle spasticity. Please advise.  Thank you.

## 2013-12-06 NOTE — Telephone Encounter (Signed)
I spoke to patient about the Tysabri and that we are just waiting for the investigation of benefits to come back.  Patient continues to have bad spasms and has not heard anything from the research department.  She would like something for the spasms.

## 2013-12-06 NOTE — Telephone Encounter (Signed)
Patient called asking if anything has been called in for her spasms.  She said that today is the worst day she has had and needs something soon.  Please call her at [phone].  Thank you

## 2013-12-06 NOTE — Telephone Encounter (Signed)
The patient called. She is having spasticity in the left arm and leg. She is in the baclofen MS study. The medication will likely come through our office. The patient wants to know when this will be started. I will send a note to Dr. Krista Blue.

## 2013-12-06 NOTE — Telephone Encounter (Signed)
Pt calling wanting the baclofen prescription called in for her spasms and pain from her MS.  Please call with any questions.  Thank you

## 2013-12-07 NOTE — Telephone Encounter (Signed)
I have called Wanda [name], she complains of worsening leg spasticity yesterday, but much improved with heating pad, and relaxing, she still wants to be participating baclofen ER trial, my office will contact her for schedule

## 2013-12-07 NOTE — Telephone Encounter (Signed)
Wes, would you please address her questions. Thanks.

## 2013-12-14 ENCOUNTER — Telehealth: Payer: Self-pay | Admitting: Neurology

## 2013-12-14 NOTE — Telephone Encounter (Signed)
Patient called to confirmed tysabri appt at [facility]. She also would like to know if we could mail her a release of information form to have her daughter taken  off  her Hippa

## 2013-12-14 NOTE — Telephone Encounter (Signed)
Left message for patient that her first appointment for Tysabri at [facility] is on 01-01-14 at 8am.  I asked that she call back to confirm that she received the message.

## 2013-12-14 NOTE — Telephone Encounter (Signed)
Patient returned call and Ammie gave her the information about her appointment at [facility].

## 2013-12-14 NOTE — Telephone Encounter (Signed)
Patient returning call to [facility]. Please call.

## 2013-12-18 ENCOUNTER — Telehealth: Payer: Self-pay | Admitting: Neurology

## 2013-12-18 NOTE — Telephone Encounter (Signed)
Called and spoke with patient concerning her appointment to come in for the [employer] 04 screening visit.  Advised we hope to call her to come in the first week of April. Patient agrees to this.

## 2013-12-19 ENCOUNTER — Other Ambulatory Visit: Payer: Self-pay | Admitting: Physician Assistant

## 2013-12-20 NOTE — Telephone Encounter (Signed)
Please phone in  Meds ordered this encounter  Medications  . zolpidem (AMBIEN) 10 MG tablet    Sig: take 1 tablet by mouth at bedtime if needed    Dispense:  30 tablet    Refill:  0

## 2013-12-20 NOTE — Telephone Encounter (Signed)
Called in.

## 2013-12-22 ENCOUNTER — Telehealth: Payer: Self-pay | Admitting: Neurology

## 2013-12-22 NOTE — Telephone Encounter (Signed)
Pt was returning Wanda [name] or Wanda [name]'s call from this morning. She wanted to confirm that she would be at the 01-01-14 appointment for her infusion.  She also had some questions and would like someone to call her back.  Thank you.

## 2013-12-22 NOTE — Telephone Encounter (Signed)
Patient is aware of infusion appt on 01/01/14

## 2013-12-28 ENCOUNTER — Other Ambulatory Visit: Payer: Self-pay | Admitting: Neurology

## 2013-12-28 DIAGNOSIS — G35 Multiple sclerosis: Secondary | ICD-10-CM

## 2014-01-01 ENCOUNTER — Encounter (HOSPITAL_COMMUNITY)
Admission: RE | Admit: 2014-01-01 | Discharge: 2014-01-01 | Disposition: A | Discharge status: Home / Self Care | Payer: Federal, State, Local not specified - PPO | Source: Ambulatory Visit | Attending: Neurology | Admitting: Neurology

## 2014-01-01 ENCOUNTER — Other Ambulatory Visit (HOSPITAL_COMMUNITY): Payer: Self-pay | Admitting: Neurology

## 2014-01-01 ENCOUNTER — Encounter (HOSPITAL_COMMUNITY): Payer: Self-pay

## 2014-01-01 VITALS — BP 127/91 | HR 82 | Temp 96.6°F | Resp 18 | Ht 60.0 in | Wt 126.0 lb

## 2014-01-01 DIAGNOSIS — G35 Multiple sclerosis: Secondary | ICD-10-CM | POA: Insufficient documentation

## 2014-01-01 HISTORY — DX: Multiple sclerosis: G35

## 2014-01-01 MED ORDER — NATALIZUMAB 300 MG/15ML IV CONC
300.0000 mg | INTRAVENOUS | Status: DC
  Administered 2014-01-01: 300 mg via INTRAVENOUS
  Filled 2014-01-01: qty 15

## 2014-01-01 MED ORDER — ACETAMINOPHEN 500 MG PO TABS
1000.0000 mg | ORAL_TABLET | Freq: Once | ORAL | Status: AC
  Administered 2014-01-01: 1000 mg via ORAL
  Filled 2014-01-01: qty 2

## 2014-01-01 MED ORDER — SODIUM CHLORIDE 0.9 % IV SOLN
INTRAVENOUS | Status: DC
  Administered 2014-01-01: 08:00:00 via INTRAVENOUS

## 2014-01-01 MED ORDER — LORATADINE 10 MG PO TABS
10.0000 mg | ORAL_TABLET | Freq: Once | ORAL | Status: AC
  Administered 2014-01-01: 10 mg via ORAL
  Filled 2014-01-01: qty 1

## 2014-01-01 NOTE — Discharge Instructions (Signed)

## 2014-01-02 ENCOUNTER — Encounter: Payer: Federal, State, Local not specified - PPO | Admitting: Women's Health

## 2014-01-03 ENCOUNTER — Other Ambulatory Visit: Payer: Self-pay | Admitting: Women's Health

## 2014-01-04 ENCOUNTER — Telehealth: Payer: Self-pay | Admitting: *Deleted

## 2014-01-04 NOTE — Telephone Encounter (Signed)
Per previous phone note, this is a drug the patient will get through research dept.  Please see note from 12/06/2013.  Forwarding to research dept to address.

## 2014-01-04 NOTE — Telephone Encounter (Signed)
Pt calling checking the status of her Baclofen Rx. Please advise

## 2014-01-08 ENCOUNTER — Ambulatory Visit: Payer: Federal, State, Local not specified - PPO | Admitting: Neurology

## 2014-01-09 MED ORDER — BACLOFEN 10 MG PO TABS: 10.0000 mg | ORAL_TABLET | Freq: Two times a day (BID) | ORAL | Status: DC

## 2014-01-15 ENCOUNTER — Other Ambulatory Visit: Payer: Self-pay | Admitting: Physician Assistant

## 2014-01-16 ENCOUNTER — Other Ambulatory Visit: Payer: Self-pay | Admitting: Physician Assistant

## 2014-01-16 NOTE — Telephone Encounter (Signed)
Faxed

## 2014-01-19 ENCOUNTER — Ambulatory Visit (INDEPENDENT_AMBULATORY_CARE_PROVIDER_SITE_OTHER): Payer: Federal, State, Local not specified - PPO | Admitting: Women's Health

## 2014-01-19 ENCOUNTER — Encounter: Payer: Self-pay | Admitting: Women's Health

## 2014-01-19 VITALS — BP 119/80 | Ht 61.0 in | Wt 126.0 lb

## 2014-01-19 DIAGNOSIS — Z79890 Hormone replacement therapy (postmenopausal): Secondary | ICD-10-CM

## 2014-01-19 DIAGNOSIS — R1032 Left lower quadrant pain: Secondary | ICD-10-CM

## 2014-01-19 DIAGNOSIS — Z01419 Encounter for gynecological examination (general) (routine) without abnormal findings: Secondary | ICD-10-CM

## 2014-01-19 MED ORDER — ESTRADIOL 0.5 MG PO TABS: ORAL_TABLET | ORAL | Status: DC

## 2014-01-19 MED ORDER — MEDROXYPROGESTERONE ACETATE 2.5 MG PO TABS: ORAL_TABLET | ORAL | Status: DC

## 2014-01-19 NOTE — Patient Instructions (Signed)
Health Recommendations for Postmenopausal Women Respected and ongoing research has looked at the most common causes of death, disability, and poor quality of life in postmenopausal women. The causes include heart disease, diseases of blood vessels, diabetes, depression, cancer, and bone loss (osteoporosis). Many things can be done to help lower the chances of developing these and other common problems: CARDIOVASCULAR DISEASE Heart Disease: A heart attack is a medical emergency. Know the signs and symptoms of a heart attack. Below are things women can do to reduce their risk for heart disease.   Do not smoke. If you smoke, quit.  Aim for a healthy weight. Being overweight causes many preventable deaths. Eat a healthy and balanced diet and drink an adequate amount of liquids.  Get moving. Make a commitment to be more physically active. Aim for 30 minutes of activity on most, if not all days of the week.  Eat for heart health. Choose a diet that is low in saturated fat and cholesterol and eliminate trans fat. Include whole grains, vegetables, and fruits. Read and understand the labels on food containers before buying.  Know your numbers. Ask your caregiver to check your blood pressure, cholesterol (total, HDL, LDL, triglycerides) and blood glucose. Work with your caregiver on improving your entire clinical picture.  High blood pressure. Limit or stop your table salt intake (try salt substitute and food seasonings). Avoid salty foods and drinks. Read labels on food containers before buying. Eating well and exercising can help control high blood pressure. STROKE  Stroke is a medical emergency. Stroke may be the result of a blood clot in a blood vessel in the brain or by a brain hemorrhage (bleeding). Know the signs and symptoms of a stroke. To lower the risk of developing a stroke:  Avoid fatty foods.  Quit smoking.  Control your diabetes, blood pressure, and irregular heart rate. THROMBOPHLEBITIS  (BLOOD CLOT) OF THE LEG  Becoming overweight and leading a stationary lifestyle may also contribute to developing blood clots. Controlling your diet and exercising will help lower the risk of developing blood clots. CANCER SCREENING  Breast Cancer: Take steps to reduce your risk of breast cancer.  You should practice "breast self-awareness." This means understanding the normal appearance and feel of your breasts and should include breast self-examination. Any changes detected, no matter how small, should be reported to your caregiver.  After age 40, you should have a clinical breast exam (CBE) every year.  Starting at age 40, you should consider having a mammogram (breast X-ray) every year.  If you have a family history of breast cancer, talk to your caregiver about genetic screening.  If you are at high risk for breast cancer, talk to your caregiver about having an MRI and a mammogram every year.  Intestinal or Stomach Cancer: Tests to consider are a rectal exam, fecal occult blood, sigmoidoscopy, and colonoscopy. Women who are high risk may need to be screened at an earlier age and more often.  Cervical Cancer:  Beginning at age 30, you should have a Pap test every 3 years as long as the past 3 Pap tests have been normal.  If you have had past treatment for cervical cancer or a condition that could lead to cancer, you need Pap tests and screening for cancer for at least 20 years after your treatment.  If you had a hysterectomy for a problem that was not cancer or a condition that could lead to cancer, then you no longer need Pap tests.    If you are between ages 65 and 70, and you have had normal Pap tests going back 10 years, you no longer need Pap tests.  If Pap tests have been discontinued, risk factors (such as a new sexual partner) need to be reassessed to determine if screening should be resumed.  Some medical problems can increase the chance of getting cervical cancer. In these  cases, your caregiver may recommend more frequent screening and Pap tests.  Uterine Cancer: If you have vaginal bleeding after reaching menopause, you should notify your caregiver.  Ovarian cancer: Other than yearly pelvic exams, there are no reliable tests available to screen for ovarian cancer at this time except for yearly pelvic exams.  Lung Cancer: Yearly chest X-rays can detect lung cancer and should be done on high risk women, such as cigarette smokers and women with chronic lung disease (emphysema).  Skin Cancer: A complete body skin exam should be done at your yearly examination. Avoid overexposure to the sun and ultraviolet light lamps. Use a strong sun block cream when in the sun. All of these things are important in lowering the risk of skin cancer. MENOPAUSE Menopause Symptoms: Hormone therapy products are effective for treating symptoms associated with menopause:  Moderate to severe hot flashes.  Night sweats.  Mood swings.  Headaches.  Tiredness.  Loss of sex drive.  Insomnia.  Other symptoms. Hormone replacement carries certain risks, especially in older women. Women who use or are thinking about using estrogen or estrogen with progestin treatments should discuss that with their caregiver. Your caregiver will help you understand the benefits and risks. The ideal dose of hormone replacement therapy is not known. The Food and Drug Administration (FDA) has concluded that hormone therapy should be used only at the lowest doses and for the shortest amount of time to reach treatment goals.  OSTEOPOROSIS Protecting Against Bone Loss and Preventing Fracture: If you use hormone therapy for prevention of bone loss (osteoporosis), the risks for bone loss must outweigh the risk of the therapy. Ask your caregiver about other medications known to be safe and effective for preventing bone loss and fractures. To guard against bone loss or fractures, the following is recommended:  If  you are less than age 50, take 1000 mg of calcium and at least 600 mg of Vitamin D per day.  If you are greater than age 50 but less than age 70, take 1200 mg of calcium and at least 600 mg of Vitamin D per day.  If you are greater than age 70, take 1200 mg of calcium and at least 800 mg of Vitamin D per day. Smoking and excessive alcohol intake increases the risk of osteoporosis. Eat foods rich in calcium and vitamin D and do weight bearing exercises several times a week as your caregiver suggests. DIABETES Diabetes Melitus: If you have Type I or Type 2 diabetes, you should keep your blood sugar under control with diet, exercise and recommended medication. Avoid too many sweets, starchy and fatty foods. Being overweight can make control more difficult. COGNITION AND MEMORY Cognition and Memory: Menopausal hormone therapy is not recommended for the prevention of cognitive disorders such as Alzheimer's disease or memory loss.  DEPRESSION  Depression may occur at any age, but is common in elderly women. The reasons may be because of physical, medical, social (loneliness), or financial problems and needs. If you are experiencing depression because of medical problems and control of symptoms, talk to your caregiver about this. Physical activity and   exercise may help with mood and sleep. Community and volunteer involvement may help your sense of value and worth. If you have depression and you feel that the problem is getting worse or becoming severe, talk to your caregiver about treatment options that are best for you. ACCIDENTS  Accidents are common and can be serious in the elderly woman. Prepare your house to prevent accidents. Eliminate throw rugs, place hand bars in the bath, shower and toilet areas. Avoid wearing high heeled shoes or walking on wet, snowy, and icy areas. Limit or stop driving if you have vision or hearing problems, or you feel you are unsteady with you movements and  reflexes. HEPATITIS C Hepatitis C is a type of viral infection affecting the liver. It is spread mainly through contact with blood from an infected person. It can be treated, but if left untreated, it can lead to severe liver damage over years. Many people who are infected do not know that the virus is in their blood. If you are a "baby-boomer", it is recommended that you have one screening test for Hepatitis C. IMMUNIZATIONS  Several immunizations are important to consider having during your senior years, including:   Tetanus, diptheria, and pertussis booster shot.  Influenza every year before the flu season begins.  Pneumonia vaccine.  Shingles vaccine.  Others as indicated based on your specific needs. Talk to your caregiver about these. Document Released: 11/13/2005 Document Revised: 09/07/2012 Document Reviewed: 07/09/2008 Exit[employer] Patient Information 2014 [employer].  

## 2014-01-19 NOTE — Progress Notes (Addendum)
Wanda [name] 04-Jun-1951 [id number]    History:    Presents for annual exam.  Postmenopausal on HRT with no bleeding. 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 Postmenopausal bleeding 2013 with a negative biopsy. Mammogram history Negative colonoscopy 2009. Diagnosed with MS 10/2013. Has not had a bone density, declines. Hypertension primary care manages.  Past medical history, past surgical history, family history and social history were all reviewed and documented in the EPIC chart. Moved here from [place]. Daughter lives here, son lives in [place]. Mother colon cancer and diabetes.  ROS:  A  ROS was performed and pertinent positives and negatives are included.  Exam:  Filed Vitals:   01/19/14 1008  BP: 119/80    General appearance:  Normal Thyroid:  Symmetrical, normal in size, without palpable masses or nodularity. Respiratory  Auscultation:  Clear without wheezing or rhonchi Cardiovascular  Auscultation:  Regular rate, without rubs, murmurs or gallops  Edema/varicosities:  Not grossly evident Abdominal  Soft,nontender, without masses, guarding or rebound.  Liver/spleen:  No organomegaly noted  Hernia:  None appreciated  Skin  Inspection:  Grossly normal   Breasts: Examined lying and sitting.     Right: Without masses, retractions, discharge or axillary adenopathy.     Left: Without masses, retractions, discharge or axillary adenopathy. Gentitourinary   Inguinal/mons:  Normal without inguinal adenopathy  External genitalia:  Normal  BUS/Urethra/Skene's glands:  Normal  Vagina:  Normal  Cervix:  Normal  Uterus:   normal in size, shape and contour.  Midline and mobile  Adnexa/parametria:     Rt: Without masses or tenderness.   Lt: Without masses or tenderness.  Anus and perineum: Normal  Digital rectal exam: Normal sphincter tone without palpated masses or tenderness  Assessment/Plan:  63 y.o. DBF G2P2 for annual exam.  MS diagnosed 10/2013 using a cane and having  gait/ mobility problems Hypertension primary care manages labs and meds Postmenopausal on HRT with no bleeding 2008  CIN-1 LEEP -  normal Paps after  Plan: HRT reviewed, risk for blood clots and strokes, states when off has increased hot flashes and does not sleep or feel well. Reviewed risks of blood clots, strokes and breast cancer. Estradiol 0.5 by mouth daily, Provera 2.5 mg daily. Prescriptions for both given. SBE's, continue annual mammogram, 3-D tomography reviewed and encouraged history of dense past. Reviewed importance of increasing regular exercise and walking, home safety and fall prevention discussed. Vitamin D 2000 daily encouraged. Declines DEXA. Vaccines reviewed, will discuss with primary care and neurologist. Pap with HR HPV typing. Reports low abdominal cramping/bloating. Will schedule ultrasound.    Huel Cote [facility], 1:50 PM 01/19/2014

## 2014-01-19 NOTE — Addendum Note (Signed)
Addended by: Huel Cote on: 01/19/2014 02:01 PM   Modules accepted: Orders

## 2014-01-29 ENCOUNTER — Encounter (HOSPITAL_COMMUNITY): Payer: Self-pay

## 2014-01-29 ENCOUNTER — Encounter (HOSPITAL_COMMUNITY)
Admission: RE | Admit: 2014-01-29 | Discharge: 2014-01-29 | Disposition: A | Discharge status: Home / Self Care | Payer: Federal, State, Local not specified - PPO | Source: Ambulatory Visit | Attending: Neurology | Admitting: Neurology

## 2014-01-29 VITALS — BP 126/77 | HR 66 | Temp 98.0°F | Resp 16

## 2014-01-29 DIAGNOSIS — G35 Multiple sclerosis: Secondary | ICD-10-CM | POA: Insufficient documentation

## 2014-01-29 MED ORDER — SODIUM CHLORIDE 0.9 % IV SOLN
INTRAVENOUS | Status: DC
  Administered 2014-01-29: 250 mL via INTRAVENOUS

## 2014-01-29 MED ORDER — ACETAMINOPHEN 500 MG PO TABS
1000.0000 mg | ORAL_TABLET | ORAL | Status: DC
  Administered 2014-01-29: 1000 mg via ORAL
  Filled 2014-01-29: qty 2

## 2014-01-29 MED ORDER — SODIUM CHLORIDE 0.9 % IV SOLN
300.0000 mg | INTRAVENOUS | Status: DC
  Administered 2014-01-29: 300 mg via INTRAVENOUS
  Filled 2014-01-29: qty 15

## 2014-01-29 MED ORDER — LORATADINE 10 MG PO TABS
10.0000 mg | ORAL_TABLET | ORAL | Status: DC
  Administered 2014-01-29: 10 mg via ORAL
  Filled 2014-01-29: qty 1

## 2014-01-29 NOTE — Progress Notes (Signed)
Uneventful infusion of TYSABRI.

## 2014-02-28 ENCOUNTER — Encounter (HOSPITAL_COMMUNITY)
Admission: RE | Admit: 2014-02-28 | Discharge: 2014-02-28 | Disposition: A | Discharge status: Home / Self Care | Payer: Federal, State, Local not specified - PPO | Source: Ambulatory Visit | Attending: Neurology | Admitting: Neurology

## 2014-02-28 ENCOUNTER — Other Ambulatory Visit: Payer: Self-pay | Admitting: Physician Assistant

## 2014-02-28 ENCOUNTER — Encounter (HOSPITAL_COMMUNITY): Payer: Self-pay

## 2014-02-28 VITALS — BP 111/66 | HR 69 | Temp 97.7°F | Resp 16 | Ht 60.0 in | Wt 127.4 lb

## 2014-02-28 DIAGNOSIS — G35 Multiple sclerosis: Secondary | ICD-10-CM

## 2014-02-28 MED ORDER — LORATADINE 10 MG PO TABS
10.0000 mg | ORAL_TABLET | ORAL | Status: DC
  Administered 2014-02-28: 10 mg via ORAL
  Filled 2014-02-28: qty 1

## 2014-02-28 MED ORDER — ACETAMINOPHEN 500 MG PO TABS
1000.0000 mg | ORAL_TABLET | ORAL | Status: DC
  Administered 2014-02-28: 1000 mg via ORAL
  Filled 2014-02-28: qty 2

## 2014-02-28 MED ORDER — SODIUM CHLORIDE 0.9 % IV SOLN
300.0000 mg | INTRAVENOUS | Status: AC
  Administered 2014-02-28: 300 mg via INTRAVENOUS
  Filled 2014-02-28: qty 15

## 2014-02-28 MED ORDER — SODIUM CHLORIDE 0.9 % IV SOLN
INTRAVENOUS | Status: DC
  Administered 2014-02-28: 08:00:00 via INTRAVENOUS

## 2014-03-01 NOTE — Telephone Encounter (Signed)
Faxed

## 2014-03-16 ENCOUNTER — Other Ambulatory Visit: Payer: Self-pay | Admitting: Physician Assistant

## 2014-03-28 ENCOUNTER — Encounter (HOSPITAL_COMMUNITY)
Admission: RE | Admit: 2014-03-28 | Discharge: 2014-03-28 | Disposition: A | Discharge status: Home / Self Care | Payer: Federal, State, Local not specified - PPO | Source: Ambulatory Visit | Attending: Neurology | Admitting: Neurology

## 2014-03-28 VITALS — BP 104/65 | HR 65 | Temp 98.0°F | Resp 16 | Ht 60.0 in | Wt 127.0 lb

## 2014-03-28 DIAGNOSIS — G35 Multiple sclerosis: Secondary | ICD-10-CM | POA: Insufficient documentation

## 2014-03-28 MED ORDER — SODIUM CHLORIDE 0.9 % IV SOLN
300.0000 mg | INTRAVENOUS | Status: DC
  Administered 2014-03-28: 300 mg via INTRAVENOUS
  Filled 2014-03-28: qty 15

## 2014-03-28 MED ORDER — ACETAMINOPHEN 500 MG PO TABS
1000.0000 mg | ORAL_TABLET | ORAL | Status: DC
  Administered 2014-03-28: 1000 mg via ORAL
  Filled 2014-03-28: qty 2

## 2014-03-28 MED ORDER — LORATADINE 10 MG PO TABS
10.0000 mg | ORAL_TABLET | ORAL | Status: DC
  Administered 2014-03-28: 10 mg via ORAL
  Filled 2014-03-28: qty 1

## 2014-03-28 MED ORDER — SODIUM CHLORIDE 0.9 % IV SOLN
INTRAVENOUS | Status: DC
  Administered 2014-03-28: 250 mL via INTRAVENOUS

## 2014-04-04 ENCOUNTER — Other Ambulatory Visit: Payer: Self-pay | Admitting: Physician Assistant

## 2014-04-04 NOTE — Telephone Encounter (Signed)
Pharmacist called as well as sending req d/t pt being completely out of med and would like it filled today. I advised pharm that our policy is that controlled meds need to be reviewed by provider Rxing for pt and we ask pt's to allow 2-4 days for refills but that I will red mark it for faster review.

## 2014-04-06 NOTE — Telephone Encounter (Signed)
Faxed

## 2014-04-11 ENCOUNTER — Other Ambulatory Visit: Payer: Self-pay | Admitting: Neurology

## 2014-04-11 ENCOUNTER — Other Ambulatory Visit: Payer: Self-pay | Admitting: Physician Assistant

## 2014-04-24 ENCOUNTER — Other Ambulatory Visit: Payer: Self-pay | Admitting: Neurology

## 2014-04-24 DIAGNOSIS — G35 Multiple sclerosis: Secondary | ICD-10-CM

## 2014-04-24 MED ORDER — SODIUM CHLORIDE 0.9 % IV SOLN: 500.0000 mg | INTRAVENOUS | Status: DC | PRN

## 2014-04-24 MED ORDER — ACETAMINOPHEN 325 MG PO TABS: 1000.0000 mg | ORAL_TABLET | Freq: Once | ORAL | Status: DC

## 2014-04-24 MED ORDER — EPINEPHRINE 0.3 MG/0.3ML IJ SOAJ: 0.3000 mg | INTRAMUSCULAR | Status: DC | PRN

## 2014-04-24 MED ORDER — LORATADINE 10 MG PO TABS: 10.0000 mg | ORAL_TABLET | Freq: Once | ORAL | Status: DC

## 2014-04-24 MED ORDER — NATALIZUMAB 300 MG/15ML IV CONC: 300.0000 mg | INTRAVENOUS | Status: AC

## 2014-04-25 ENCOUNTER — Encounter (HOSPITAL_COMMUNITY)
Admission: RE | Admit: 2014-04-25 | Discharge: 2014-04-25 | Disposition: A | Discharge status: Home / Self Care | Payer: Federal, State, Local not specified - PPO | Source: Ambulatory Visit | Attending: Neurology | Admitting: Neurology

## 2014-04-25 ENCOUNTER — Other Ambulatory Visit: Payer: Self-pay | Admitting: Neurology

## 2014-04-25 ENCOUNTER — Other Ambulatory Visit: Payer: Self-pay | Admitting: *Deleted

## 2014-04-25 ENCOUNTER — Encounter (HOSPITAL_COMMUNITY): Payer: Self-pay

## 2014-04-25 VITALS — BP 104/64 | HR 63 | Temp 97.8°F | Resp 18

## 2014-04-25 DIAGNOSIS — G35 Multiple sclerosis: Secondary | ICD-10-CM | POA: Insufficient documentation

## 2014-04-25 MED ORDER — SODIUM CHLORIDE 0.9 % IV SOLN
300.0000 mg | INTRAVENOUS | Status: DC
  Administered 2014-04-25: 300 mg via INTRAVENOUS
  Filled 2014-04-25: qty 15

## 2014-04-25 MED ORDER — SODIUM CHLORIDE 0.9 % IV SOLN
INTRAVENOUS | Status: DC
  Administered 2014-04-25: 09:00:00 via INTRAVENOUS

## 2014-04-25 MED ORDER — LORATADINE 10 MG PO TABS
10.0000 mg | ORAL_TABLET | Freq: Once | ORAL | Status: AC
  Administered 2014-04-25: 10 mg via ORAL
  Filled 2014-04-25: qty 1

## 2014-04-25 MED ORDER — ACETAMINOPHEN 500 MG PO TABS
1000.0000 mg | ORAL_TABLET | Freq: Once | ORAL | Status: AC
  Administered 2014-04-25: 1000 mg via ORAL
  Filled 2014-04-25: qty 2

## 2014-04-25 NOTE — Discharge Instructions (Signed)

## 2014-04-25 NOTE — Progress Notes (Signed)
Encouraged patient to stay for 1 hour observation post Tysabri infusion. Stayed 10 minutes. Patient stated she was unable to stay. No signs of adverse reaction noted. Instructed to call Dr. Rhea Belton office for problems/concerns once discharged from Short Stay.

## 2014-05-08 ENCOUNTER — Other Ambulatory Visit: Payer: Self-pay | Admitting: Physician Assistant

## 2014-05-08 NOTE — Telephone Encounter (Signed)
I refilled x 1 mos of trazadone w/note RTC for more. Pending zolpidem w/ note for review

## 2014-05-09 ENCOUNTER — Telehealth: Payer: Self-pay | Admitting: Neurology

## 2014-05-09 NOTE — Telephone Encounter (Signed)
Called patient back spoke with her she stated she called wrong office.

## 2014-05-09 NOTE — Telephone Encounter (Signed)
This patient left a message on my voice mail wanting to speak to someone about her tysabri. Please call

## 2014-05-11 ENCOUNTER — Telehealth: Payer: Self-pay

## 2014-05-11 NOTE — Telephone Encounter (Signed)
Spoke to pt, made her aware of appt. Need for refills of sleep aid medications.  She understood and will walk in on the 19th to see chelle.

## 2014-05-11 NOTE — Telephone Encounter (Signed)
Pt is needing authorization for a refill on her generic form of Ambien prescribed by [employer]

## 2014-05-23 ENCOUNTER — Ambulatory Visit (INDEPENDENT_AMBULATORY_CARE_PROVIDER_SITE_OTHER): Payer: Federal, State, Local not specified - PPO

## 2014-05-23 ENCOUNTER — Encounter (HOSPITAL_COMMUNITY): Payer: Self-pay

## 2014-05-23 ENCOUNTER — Ambulatory Visit (INDEPENDENT_AMBULATORY_CARE_PROVIDER_SITE_OTHER): Payer: Federal, State, Local not specified - PPO | Admitting: Family Medicine

## 2014-05-23 ENCOUNTER — Encounter (HOSPITAL_COMMUNITY)
Admission: RE | Admit: 2014-05-23 | Discharge: 2014-05-23 | Disposition: A | Discharge status: Home / Self Care | Payer: Federal, State, Local not specified - PPO | Source: Ambulatory Visit | Attending: Neurology | Admitting: Neurology

## 2014-05-23 VITALS — BP 102/66 | HR 66 | Temp 97.9°F | Resp 18 | Ht 60.0 in | Wt 124.1 lb

## 2014-05-23 VITALS — BP 124/64 | HR 73 | Temp 98.0°F | Resp 16 | Ht 60.5 in | Wt 126.0 lb

## 2014-05-23 DIAGNOSIS — M25579 Pain in unspecified ankle and joints of unspecified foot: Secondary | ICD-10-CM

## 2014-05-23 DIAGNOSIS — G35 Multiple sclerosis: Secondary | ICD-10-CM | POA: Diagnosis not present

## 2014-05-23 DIAGNOSIS — M25571 Pain in right ankle and joints of right foot: Secondary | ICD-10-CM

## 2014-05-23 DIAGNOSIS — G4700 Insomnia, unspecified: Secondary | ICD-10-CM

## 2014-05-23 DIAGNOSIS — G35D Multiple sclerosis, unspecified: Secondary | ICD-10-CM

## 2014-05-23 MED ORDER — TRAZODONE HCL 50 MG PO TABS: ORAL_TABLET | ORAL | Status: DC

## 2014-05-23 MED ORDER — SODIUM CHLORIDE 0.9 % IV SOLN
INTRAVENOUS | Status: DC
  Administered 2014-05-23: 250 mL via INTRAVENOUS

## 2014-05-23 MED ORDER — ACETAMINOPHEN 500 MG PO TABS
1000.0000 mg | ORAL_TABLET | Freq: Once | ORAL | Status: AC
  Administered 2014-05-23: 1000 mg via ORAL
  Filled 2014-05-23: qty 2

## 2014-05-23 MED ORDER — SODIUM CHLORIDE 0.9 % IV SOLN
300.0000 mg | INTRAVENOUS | Status: DC
  Administered 2014-05-23: 300 mg via INTRAVENOUS
  Filled 2014-05-23: qty 15

## 2014-05-23 MED ORDER — LORATADINE 10 MG PO TABS
10.0000 mg | ORAL_TABLET | Freq: Once | ORAL | Status: AC
  Administered 2014-05-23: 10 mg via ORAL
  Filled 2014-05-23: qty 1

## 2014-05-23 MED ORDER — ZOLPIDEM TARTRATE 10 MG PO TABS: 10.0000 mg | ORAL_TABLET | Freq: Every evening | ORAL | Status: DC | PRN

## 2014-05-23 NOTE — Progress Notes (Signed)
Subjective:    Patient ID: Wanda [name], female    DOB: 1951-10-05, 63 y.o.   MRN: [id number]   PCP: Daemien Fronczak, PA-C  Chief Complaint  Patient presents with  . Foot Pain    right foot x 2 months  . hand stiffness    both     Medications, allergies, past medical history, surgical history, family history, social history and problem list reviewed and updated.  Prior to Admission medications   Medication Sig Start Date End Date Taking? Authorizing Provider  Acetylcarnitine HCl (ACETYL L-CARNITINE) 250 MG CAPS Take by mouth.   Yes Historical Provider, MD  atorvastatin (LIPITOR) 20 MG tablet take 1 tablet by mouth once daily   Yes Mancel Bale, PA-C  baclofen (LIORESAL) 10 MG tablet take 1 tablet by mouth twice a day 04/11/14  Yes Marcial Pacas, MD  Cholecalciferol (D3 ADULT PO) Take by mouth.   Yes Historical Provider, MD  cholecalciferol (VITAMIN D) 1000 UNITS tablet Take 1,000 Units by mouth daily. Taking Vit D3   Yes Historical Provider, MD  Cyanocobalamin (B-12) 100 MCG TABS Take by mouth.   Yes Historical Provider, MD  estradiol (ESTRACE) 0.5 MG tablet take 1 tablet by mouth once daily 01/19/14  Yes Huel Cote, NP  Flaxseed, Linseed, (GROUND FLAX SEEDS) POWD Take by mouth.   Yes Historical Provider, MD  hydrochlorothiazide (HYDRODIURIL) 25 MG tablet take 1 tablet by mouth once daily   Yes Mancel Bale, PA-C  ibuprofen (ADVIL,MOTRIN) 200 MG tablet Take 800 mg by mouth 2 (two) times daily.   Yes Historical Provider, MD  LICORICE-GLYCINE PO Take by mouth.   Yes Historical Provider, MD  medroxyPROGESTERone (PROVERA) 2.5 MG tablet take 1 tablet by mouth once daily 01/19/14  Yes Huel Cote, NP  meloxicam (MOBIC) 15 MG tablet Take 1 tablet (15 mg total) by mouth daily. 11/28/13  Yes Marcial Pacas, MD  Omega-3 Fatty Acids (FISH OIL) 300 MG CAPS Take by mouth.   Yes Historical Provider, MD  traZODone (DESYREL) 50 MG tablet Take 1/2 to 1 tablet at bedtime as needed for sleep. 05/23/14  Yes  Mardel Grudzien S Constantino Starace, PA-C  vitamin E 100 UNIT capsule Take by mouth daily.   Yes Historical Provider, MD  zolpidem (AMBIEN) 10 MG tablet Take 1 tablet (10 mg total) by mouth at bedtime as needed for sleep. 05/23/14  Yes Aydin Cavalieri S Sundra Haddix, PA-C  glucosamine-chondroitin 500-400 MG tablet Take 1 tablet by mouth 2 (two) times daily with a meal.    Historical Provider, MD    Foot Pain    Presents for refills of meds for insomnia.  Has run out of zolpidem again.  With the trazadone alone, she only sleeps for a few hours.  Is receiving Tysabri infusions for MS.  Overall, feels pretty good.  Gets tired more easily than she'd like, and has a lot of stiffness in her hands. She's enjoying doing lots of research on MS, and is writing, but is limited by the fatigue in her hands and arms. She still walks her two dogs in the park for 45 minutes every day, and denies feeling any depression or anxiety.  For the past 2 months she's felt a swelling in the RIGHT foot when she walks, "like a potato" is in her shoe.  No trauma or injury that she can identify.    Review of Systems As above.  No CP, SOB, HA, GI/GU symptoms.    Objective:   Physical Exam  Constitutional: She is oriented to person, place, and time. She appears well-developed and well-nourished. No distress.  BP 124/64  Pulse 73  Temp(Src) 98 F (36.7 C) (Oral)  Resp 16  Ht 5' 0.5" (1.537 m)  Wt 126 lb (57.153 kg)  BMI 24.19 kg/m2  SpO2 100%   Eyes: Conjunctivae are normal. No scleral icterus.  Neck: Neck supple. No thyromegaly present.  Cardiovascular: Normal rate, regular rhythm, normal heart sounds and intact distal pulses.   Pulmonary/Chest: Effort normal and breath sounds normal.  Musculoskeletal:       Right foot: She exhibits normal range of motion, no tenderness, no bony tenderness, no swelling and normal capillary refill.  Location of discomfort is at the base of the 2nd/third toes, plantar surface.  I am unable to reproduce the pain  on exam.  Lymphadenopathy:    She has no cervical adenopathy.  Neurological: She is alert and oriented to person, place, and time.  Skin: Skin is warm and dry.  Psychiatric: She has a normal mood and affect. Her behavior is normal.   RIGHT FOOT: [facility] reading (PRIMARY) by  Dr. Lorelei Pont. Normal bony structures of the foot.         Assessment & Plan:  1. Insomnia Continue current treatment. - zolpidem (AMBIEN) 10 MG tablet; Take 1 tablet (10 mg total) by mouth at bedtime as needed for sleep.  Dispense: 30 tablet; Refill: 0 - traZODone (DESYREL) 50 MG tablet; Take 1/2 to 1 tablet at bedtime as needed for sleep.  Dispense: 30 tablet; Refill: 5  2. Multiple sclerosis Continue current treatment per neurology.  3. Pain in joint, ankle and foot, right Recommended referral to ortho.  Probably a neuroma. She'll let me know when she's ready for the referral. - DG Foot Complete Right; Future   Fara Chute, PA-C Physician Assistant-Certified [facility]

## 2014-05-23 NOTE — Patient Instructions (Signed)
Let me know when you're ready to see a specialist about your foot.  I think you have a NEUROMA.

## 2014-05-24 ENCOUNTER — Other Ambulatory Visit: Payer: Self-pay | Admitting: Physician Assistant

## 2014-05-24 ENCOUNTER — Ambulatory Visit (INDEPENDENT_AMBULATORY_CARE_PROVIDER_SITE_OTHER): Payer: Self-pay | Admitting: Neurology

## 2014-05-24 DIAGNOSIS — G35 Multiple sclerosis: Secondary | ICD-10-CM

## 2014-05-24 NOTE — Progress Notes (Signed)
Patient is her for screen visit for [employer] trial, please see paperwork for it.

## 2014-05-24 NOTE — Telephone Encounter (Signed)
[name], you just saw pt yesterday for other chronic issues, but don't see these meds addressed. OK to give RFs?

## 2014-05-28 ENCOUNTER — Telehealth: Payer: Self-pay

## 2014-05-28 ENCOUNTER — Other Ambulatory Visit: Payer: Self-pay | Admitting: *Deleted

## 2014-05-28 MED ORDER — HYDROCHLOROTHIAZIDE 25 MG PO TABS: ORAL_TABLET | ORAL | Status: DC

## 2014-05-28 MED ORDER — ATORVASTATIN CALCIUM 20 MG PO TABS: ORAL_TABLET | ORAL | Status: DC

## 2014-05-28 NOTE — Telephone Encounter (Signed)
Pt called and stated there is a problem w/her zolpidem Rx getting filled. Called the pharm bc we have not received any fax from [facility]. Was advised that a PA is needed. [facility] B[facility] [id number], ID # [id number], ph [phone]. Pt stated that she was started on the lower dose of zolpidem and was increased for better effectiveness. She has not tried any other meds for insomnia. Completed form on covermymeds. Pending.

## 2014-05-30 NOTE — Telephone Encounter (Signed)
PA approved through 05/28/15. Notified pharm and pt.

## 2014-05-31 ENCOUNTER — Encounter (INDEPENDENT_AMBULATORY_CARE_PROVIDER_SITE_OTHER): Payer: Self-pay | Admitting: Neurology

## 2014-05-31 DIAGNOSIS — Z0289 Encounter for other administrative examinations: Secondary | ICD-10-CM

## 2014-06-20 ENCOUNTER — Encounter (HOSPITAL_COMMUNITY): Payer: Self-pay

## 2014-06-20 ENCOUNTER — Telehealth: Payer: Self-pay | Admitting: *Deleted

## 2014-06-20 ENCOUNTER — Encounter (HOSPITAL_COMMUNITY)
Admission: RE | Admit: 2014-06-20 | Discharge: 2014-06-20 | Disposition: A | Discharge status: Home / Self Care | Payer: Federal, State, Local not specified - PPO | Source: Ambulatory Visit | Attending: Neurology | Admitting: Neurology

## 2014-06-20 VITALS — BP 115/72 | HR 57 | Temp 98.7°F | Resp 18 | Ht 60.0 in | Wt 124.0 lb

## 2014-06-20 DIAGNOSIS — G35 Multiple sclerosis: Secondary | ICD-10-CM | POA: Insufficient documentation

## 2014-06-20 MED ORDER — ACETAMINOPHEN 500 MG PO TABS
1000.0000 mg | ORAL_TABLET | Freq: Once | ORAL | Status: AC
  Administered 2014-06-20: 1000 mg via ORAL
  Filled 2014-06-20: qty 2

## 2014-06-20 MED ORDER — LORATADINE 10 MG PO TABS
10.0000 mg | ORAL_TABLET | Freq: Once | ORAL | Status: AC
  Administered 2014-06-20: 10 mg via ORAL
  Filled 2014-06-20: qty 1

## 2014-06-20 MED ORDER — SODIUM CHLORIDE 0.9 % IV SOLN
300.0000 mg | INTRAVENOUS | Status: AC
  Administered 2014-06-20: 300 mg via INTRAVENOUS
  Filled 2014-06-20: qty 15

## 2014-06-20 MED ORDER — SODIUM CHLORIDE 0.9 % IV SOLN
INTRAVENOUS | Status: DC
  Administered 2014-06-20: 08:00:00 via INTRAVENOUS

## 2014-06-20 NOTE — Telephone Encounter (Signed)
Pt calling and wanted to ask another questions.   Spoke with Ammie yesterday.

## 2014-06-20 NOTE — Progress Notes (Signed)
Patient unable to stay for recommended 1 hour observation post Tysabri infusion. Stayed 10 minutes. No signs of adverse reaction noted. Instructed to call Dr. Rhea Belton office for problems/concerns once discharged from Short Stay. Verbalized understanding.

## 2014-06-20 NOTE — Telephone Encounter (Signed)
Called and left VM message for return call concerning questions

## 2014-06-20 NOTE — Telephone Encounter (Signed)
Spoke with patient and wanted research Wanda [name]

## 2014-06-26 ENCOUNTER — Other Ambulatory Visit: Payer: Self-pay | Admitting: Physician Assistant

## 2014-06-28 ENCOUNTER — Ambulatory Visit (INDEPENDENT_AMBULATORY_CARE_PROVIDER_SITE_OTHER): Payer: Self-pay | Admitting: Neurology

## 2014-06-28 DIAGNOSIS — G4700 Insomnia, unspecified: Secondary | ICD-10-CM

## 2014-06-28 MED ORDER — ZOLPIDEM TARTRATE 10 MG PO TABS: 10.0000 mg | ORAL_TABLET | Freq: Every evening | ORAL | Status: DC | PRN

## 2014-06-28 NOTE — Progress Notes (Signed)
Patient is in [employer] study, finished evaluation according to protocol

## 2014-06-28 NOTE — Telephone Encounter (Signed)
Wanda [name]:  Please help patient get Catering manager program, she has trouble paying her infusion bills.

## 2014-06-28 NOTE — Telephone Encounter (Signed)
Please advise. Thanks.  

## 2014-06-29 NOTE — Telephone Encounter (Signed)
Spoke to patient and relayed that she needs to call the Touch program at the Tysabri number and ask to speak to someone who can inform her about the patient assistance program.  She verbalized understanding and will let us know if she further help.

## 2014-07-06 ENCOUNTER — Ambulatory Visit (INDEPENDENT_AMBULATORY_CARE_PROVIDER_SITE_OTHER): Payer: Federal, State, Local not specified - PPO | Admitting: Neurology

## 2014-07-06 ENCOUNTER — Encounter: Payer: Self-pay | Admitting: Neurology

## 2014-07-06 DIAGNOSIS — G35 Multiple sclerosis: Secondary | ICD-10-CM

## 2014-07-06 NOTE — Progress Notes (Signed)
Patient is in [employer] study, finis    PATIENT: Wanda [name] DOB: [birth year]  HISTORICAL   INITIAL VISIT In FEB 2015  Wanda [name] is accompanied by her daughter, Wanda [name], she was initially evaluated by Dr Wanda [name] in December 2nd 2014, referred by her primary care Dr.Jeffrey  Wanda [name] is a 63 years old right-handed African American female, with past medical history of hypertension, hyperlipidemia, longtime smoker  She presents with acute onset of slurred speech, vertigo, since November7th 2014, also has gait difficulty,  CAT scan of the brain without contrast in November 2014 showed moderate for age nonspecific cerebral white matter changes  She was also referred for the brain MRI showed multiple scattered , discrete and confluent lesions at periventricular, periatrial, peri-callosal brainstem, cranial medullary junction and upper cervical spine white matter hyperintensities on T2/flair.  Enhancing lesions in the left paramedian pons. Left posterior frontal and left parietal and tiny right frontal periventricular white matter which likely represent active demarcating plaques. multiple T1 black holes noted in the periventricular and subcortical white matter. There is mild general cerebral atrophy.  MRI scan of the cervical spine showed spinal cord hyperintensities atC2 and C5 likely represent demyelinating plaques. Contrast images show actively enhancing lesion at C2.  Laboratory evaluation showed normal or negative NMO antibody, A1c 5. 9, ANA, CPK, D32, folic acid, vitamin B1, ESR, TSH, RPR, CMP, , CBC,  CSF study show oligoclonal banding four,, normal meyelin basic protein. ACE was normal 8,   WBC 0, RBC 6, glucose 59, total protein 43,   She moved to [place] around 2010, In 2001, she had one episode of sudden right visual loss, reported extensive evaluation at that time, was diagnosed with right "eye problem"have received the by mouth steroid, intraocular steroid injection, per  patient MRI of the brain was done at that time too.  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  JC virus titer was positive, 0.66, we have reviewed her MRI of brain, and cervical scan again, findings are consistent with an active relapsing remitting multiple sclerosis,  After discuss with patient, and her daughter, we decided to proceed with Tysarbri IV infusion,  she understands the potential risks, including PML infection, but with her low JC virus  titer, she has a less than 0. 10/998 chance of getting PML in the first 24 months, we are monitoring her JC virus antibody titer every 3 months, repeating MRI of brain every to 6 months.  She also agrees to enroll in baclofen extended release trial, because she continues to have bilateral upper and lower extremity muscle spasticity.  UPDATE Oct 2nd 2015:  She was enrolled in [employer] baclofen extended release study since May 24 2014,, tolerating the medication well, on last visit in September 24th 2015, her baclofen dosage was increased from 20 mg, to:30 milligrams daily, she tolerated the medication very well, reported continued improvement, left bilaterally hands muscle spasm, last bilateral lower extremity muscle spasm, she is able to squeeze them and again,  she fell a few days ago, has right lateral thigh area proves, achy pain,   REVIEW OF SYSTEMS: Full 14 system review of systems performed and notable only for  gait difficulty, right lateral thigh pain  ALLERGIES: No Known  Allergies  HOME MEDICATIONS: Outpatient Prescriptions Prior to Visit  Medication Sig Dispense Refill  . atorvastatin (LIPITOR) 20 MG tablet Take 1 tablet (20 mg total) by mouth daily.  90 tablet  3  . estradiol (ESTRACE) 0.5 MG tablet Take 1 tablet (0.5 mg total) by mouth daily.  90 tablet  3  . glucosamine-chondroitin 500-400 MG tablet Take 1 tablet by mouth 2 (two) times daily with a meal.      . hydrochlorothiazide (HYDRODIURIL) 25 MG tablet take 1 tablet by mouth once daily  90 tablet  1  . medroxyPROGESTERone (PROVERA) 2.5 MG tablet Take 1 tablet (2.5 mg total) by mouth daily.  90 tablet  4  . meloxicam (MOBIC) 15 MG tablet take 1 tablet by mouth once daily  90 tablet  1  . traZODone (DESYREL) 50 MG tablet TAKE 1/2 TO 1 TABLET AT BEDTIME AS NEEDED FOR SLEEP.  30 tablet  2  . varenicline (CHANTIX) 1 MG tablet Take 1 tablet (1 mg total) by mouth 2 (two) times daily.  60 tablet  5  . zolpidem (AMBIEN) 10 MG tablet take 1 tablet by mouth at bedtime if needed  30 tablet  0     PAST MEDICAL HISTORY: Past Medical History  Diagnosis Date  . Elevated cholesterol   . Hypertension   . Arthritis   . Cervical dysplasia   . Depression   . Anxiety   . Macular degeneration of right eye 2001    PAST SURGICAL HISTORY: Past Surgical History  Procedure Laterality Date  . Colposcopy    . Knee surgery    . Cholecystectomy    . Tubal ligation      FAMILY HISTORY: Family History  Problem Relation Age of Onset  . Cancer Mother     Colon  . Diabetes Mother   . Hypertension Mother   . Arthritis Mother   . Cancer Father     prostate  . Kidney disease Brother     congenital single kidney  . Arthritis Sister   . Arthritis Sister   . Hematuria Son   . Gout Brother   . Multiple sclerosis Brother   . Arthritis Brother   . HIV Brother   . Cancer Brother     spinal    SOCIAL HISTORY:  History   Social History  . Marital Status: Widowed    Spouse Name: N/A    Number of Children:  2  . Years of Education: college   Occupational History  . retired     Social History Main Topics  . Smoking status: Current Some Day Smoker -- 1.00 packs/day for 45 years    Types: Cigarettes    Last Attempt to Quit: 01/21/2013  . Smokeless tobacco: Never Used  . Alcohol Use: 3.6 oz/week    6 Cans of beer per week  . Drug Use: No  . Sexual Activity: No   Other Topics Concern  . Not on file   Social History Narrative   Grew up in [place] area, 1 of 10 siblings - 94 still living, married for 33 years then divorced, moved to [place] to be near daughter post retirement, als has 1 son. 2 dogs.       Used to  be very active dancer, runner - no longer due to arthritis.     PHYSICAL EXAM   There were no vitals filed for this visit.  Not recorded    There is no weight on file to calculate BMI.   Generalized: In no acute distress  Neck: Supple, no carotid bruits   Cardiac: Regular rate rhythm  Pulmonary: Clear to auscultation bilaterally  Musculoskeletal: No deformity  Neurological examination  Mentation: Alert oriented to time, place, history taking, and causual conversation  Cranial nerve II-XII: Pupils were equal round reactive to light extraocular movements were full, Visual field were full on confrontational test. Bilateral fundi were sharp.  Facial sensation and strength were normal. Hearing was intact to finger rubbing bilaterally. Uvula tongue midline.  head turning and shoulder shrug and were normal and symmetric.Tongue protrusion into cheek strength was normal.  Motor: mild left arm weakness, proximal and distal 4/5.  Sensory: Intact to fine touch, pinprick, preserved vibratory sensation, and proprioception at toes.  Coordination: she has mild trunk ataxia, mild left finger to nose, mild bilateral heel-to-shin dysmetria  Gait:  right base, cautious, mildly unsteady gait   Romberg signs: Positive  Deep tendon reflexes: Brachioradialis 2/2, biceps 2/2, triceps  2/2, patellar 2/2, Achilles 2/2, plantar responses were flexor bilaterally.   DIAGNOSTIC DATA (LABS, IMAGING, TESTING) - I reviewed patient records, labs, notes, testing and imaging myself where available.  Lab Results  Component Value Date   WBC 5.3 12/29/2012   HGB 14.2 12/29/2012   HCT 42.0 12/29/2012   MCV 86.4 12/29/2012   PLT 242 12/29/2012      Component Value Date/Time   NA 141 06/29/2013 1501   K 3.7 06/29/2013 1501   CL 104 06/29/2013 1501   CO2 25 06/29/2013 1501   GLUCOSE 85 06/29/2013 1501   BUN 11 06/29/2013 1501   CREATININE 0.89 06/29/2013 1501   CREATININE 0.77 04/21/2011 0901   CALCIUM 10.0 06/29/2013 1501   PROT 8.3 06/29/2013 1501   ALBUMIN 4.7 06/29/2013 1501   AST 16 06/29/2013 1501   ALT 18 06/29/2013 1501   ALKPHOS 82 06/29/2013 1501   BILITOT 0.4 06/29/2013 1501   GFRNONAA >60 04/21/2011 0901   GFRAA >60 04/21/2011 0901   Lab Results  Component Value Date   CHOL 168 06/29/2013   HDL 70 06/29/2013   LDLCALC 85 06/29/2013   TRIG 63 06/29/2013   CHOLHDL 2.4 06/29/2013   Lab Results  Component Value Date   HGBA1C 5.9* 09/05/2013   Lab Results  Component Value Date   VITAMINB12 547 09/05/2013   Lab Results  Component Value Date   TSH 0.463 09/05/2013    ASSESSMENT AND PLAN   63 years old [place] American female, with previous history of optic neuritis, now presenting with acute onset of unbalanced gait, gait difficulty, marked abnormal MRI of the brain, and cervical, detailed above, consistent with relapsing remitting multiple sclerosis, she has low titer positive JC virus antibody 0.6 6  After discuss with patient, and her daughter, we decided to proceed with Tysarbri IV infusion,  she understands the potential risks, including PML infection, but with her low JC virus  titer, she has a less than 0. 10/998 chance of getting PML in the first 24 months, we are monitoring her JC virus antibody titer every 3 months, repeating MRI of brain every to 6 months.  She was  enrolled in [employer] Baclofen ER since August 20th 2015, now taking 73m since Sept 24th 2015, she reported  symptomatic improvement with higher dosage, no significant side effects,  Will continue titrating her baclofen ER to 20 mg, 2 tablets every night continue followup according to protocol,    Marcial Pacas, M.D. Ph.D.  [facility] [address], [place], [place] [address] [phone] evaluation according to protocol

## 2014-07-13 NOTE — Telephone Encounter (Signed)
Message copied by Marcial Pacas on Fri Jul 13, 2014  4:19 PM ------      Message from: [place], [place] M      Created: Fri Jul 06, 2014  2:11 PM      Regarding: EPIC note       Dr. Krista Blue, Can you amend the note in Deb's chart as we titrated her up to 40 mg of Baclofen today at her research visit.  ------

## 2014-07-13 NOTE — Telephone Encounter (Signed)
titrating up her baclofen ER 20mg  2 tabs qday

## 2014-07-18 ENCOUNTER — Encounter (HOSPITAL_COMMUNITY): Payer: Self-pay

## 2014-07-18 ENCOUNTER — Encounter (HOSPITAL_COMMUNITY)
Admission: RE | Admit: 2014-07-18 | Discharge: 2014-07-18 | Disposition: A | Discharge status: Home / Self Care | Payer: Federal, State, Local not specified - PPO | Source: Ambulatory Visit | Attending: Neurology | Admitting: Neurology

## 2014-07-18 VITALS — BP 111/73 | HR 74 | Temp 98.4°F | Resp 16 | Ht 60.0 in | Wt 124.0 lb

## 2014-07-18 DIAGNOSIS — G35 Multiple sclerosis: Secondary | ICD-10-CM | POA: Diagnosis present

## 2014-07-18 MED ORDER — ACETAMINOPHEN 500 MG PO TABS
1000.0000 mg | ORAL_TABLET | Freq: Once | ORAL | Status: AC
  Administered 2014-07-18: 1000 mg via ORAL
  Filled 2014-07-18: qty 2

## 2014-07-18 MED ORDER — SODIUM CHLORIDE 0.9 % IV SOLN
300.0000 mg | INTRAVENOUS | Status: AC
  Administered 2014-07-18: 300 mg via INTRAVENOUS
  Filled 2014-07-18: qty 15

## 2014-07-18 MED ORDER — LORATADINE 10 MG PO TABS
10.0000 mg | ORAL_TABLET | Freq: Once | ORAL | Status: AC
  Administered 2014-07-18: 10 mg via ORAL
  Filled 2014-07-18: qty 1

## 2014-07-18 MED ORDER — SODIUM CHLORIDE 0.9 % IV SOLN
INTRAVENOUS | Status: DC
  Administered 2014-07-18: 250 mL via INTRAVENOUS

## 2014-07-18 NOTE — Progress Notes (Signed)
Pt stayed only 30 minutes of the suggested 1 hour post TYSABRI infusion observation time. Uneventful infusion and pt states she will call her neurologist for any questions or concerns

## 2014-07-19 ENCOUNTER — Telehealth: Payer: Self-pay

## 2014-07-19 DIAGNOSIS — I10 Essential (primary) hypertension: Secondary | ICD-10-CM

## 2014-07-19 DIAGNOSIS — G35 Multiple sclerosis: Secondary | ICD-10-CM

## 2014-07-19 NOTE — Telephone Encounter (Signed)
I called patient to see how she was doing on Baclofen 40mg  and she proceeded to tell me she was about to call us because since she received her Tysabri infusion yesterday at [facility] she has noticed she is still very weak, with no energy. She states her whole left side is weak. She says she usually feels better after she receives her infusion so she is not sure what is going on or how to feel better. Would like Dr. Rhea Belton advice. I sent message to Dr. Krista Blue.

## 2014-07-20 ENCOUNTER — Other Ambulatory Visit: Payer: Self-pay

## 2014-07-26 ENCOUNTER — Telehealth: Payer: Self-pay

## 2014-07-26 NOTE — Telephone Encounter (Addendum)
She complains of increased weakness, Oct 7th 2015, right side was worse, difficulty using right hand,   She has taken baclofen ER 40mg  qday, and also extra 20mg  baclofen IR 20mg  x 2 night, on oct 20 and 21 2015.  MRI in Jan 2015 showed enhancing lesions.  Repeat MRI brain w/wo, follow up afterwards.

## 2014-07-26 NOTE — Addendum Note (Signed)
Addended by: Marcial Pacas on: 07/26/2014 02:43 PM   Modules accepted: Orders

## 2014-07-26 NOTE — Telephone Encounter (Signed)
Wanda [name], please make sure that she is going to have MRI brain soon. And have a follow up with me.

## 2014-07-27 NOTE — Telephone Encounter (Signed)
Spoke with patient on 07/26/14 and she stated she took her "old" Baclofen prescription medicine along with her 40mg  given for the study to help relieve stiffness in her right hand. Her old prescription is 20mg . She took a total of 60mg  on 10/20 and 10/21.  I advised that we can not veer away from the study protocol like that and that we must strictly adhere to the conditions of the protocol. I advised that I would have Dr. Krista Blue call her. I spoke with Dr. Krista Blue and she called patient to discuss.

## 2014-07-30 NOTE — Telephone Encounter (Signed)
Called and left patient a message to schedule follow up after her MRI.

## 2014-07-30 NOTE — Telephone Encounter (Signed)
Spoke with patient and she is coming to see Dr.Yan 08-10-2014. To follow up after her MRI results.

## 2014-08-01 ENCOUNTER — Ambulatory Visit (INDEPENDENT_AMBULATORY_CARE_PROVIDER_SITE_OTHER): Payer: Federal, State, Local not specified - PPO

## 2014-08-01 DIAGNOSIS — G35 Multiple sclerosis: Secondary | ICD-10-CM

## 2014-08-01 DIAGNOSIS — I10 Essential (primary) hypertension: Secondary | ICD-10-CM

## 2014-08-01 MED ORDER — GADOPENTETATE DIMEGLUMINE 469.01 MG/ML IV SOLN: 12.0000 mL | Freq: Once | INTRAVENOUS | Status: AC | PRN

## 2014-08-02 ENCOUNTER — Telehealth: Payer: Self-pay | Admitting: Neurology

## 2014-08-02 NOTE — Telephone Encounter (Signed)
I have called her, repeat MRI brain showed.  Moderate-severe periventricular and subcortical and pontine and upper cervical cord chronic demyelinating disease.  2. Mild diffuse and severe corpus callosum atrophy.  3. No abnormal lesions are seen on post contrast views.  4. Compared to MRI on 09/21/13 and 10/11/13, there is a new chronic plaque (61mm) in the left basal ganglia (series 17, image 13). Also prior acute plaques no longer enhance.  Keep follow up appt

## 2014-08-06 ENCOUNTER — Encounter (HOSPITAL_COMMUNITY): Payer: Self-pay

## 2014-08-10 ENCOUNTER — Ambulatory Visit (INDEPENDENT_AMBULATORY_CARE_PROVIDER_SITE_OTHER): Payer: Federal, State, Local not specified - PPO | Admitting: Neurology

## 2014-08-10 ENCOUNTER — Encounter: Payer: Self-pay | Admitting: Neurology

## 2014-08-10 VITALS — BP 100/69 | HR 65 | Ht 60.0 in | Wt 124.0 lb

## 2014-08-10 DIAGNOSIS — G35 Multiple sclerosis: Secondary | ICD-10-CM

## 2014-08-10 DIAGNOSIS — R269 Unspecified abnormalities of gait and mobility: Secondary | ICD-10-CM

## 2014-08-10 MED ORDER — METHYLPREDNISOLONE 4 MG PO KIT
PACK | ORAL | Status: DC
Start: 2014-08-10 — End: 2014-12-05

## 2014-08-10 NOTE — Progress Notes (Addendum)
PATIENT: Wanda [name] DOB: 1951/01/31  HISTORICAL   INITIAL VISIT In FEB 2015  ELVIA AYDIN is accompanied by her daughter, [name], she was initially evaluated by Dr Rexene Alberts in December 2nd 2014, referred by her primary care Dr.Jeffrey  Ms. Utley is a 63 years old right-handed African American female, with past medical history of hypertension, hyperlipidemia, longtime smoker  She presents with acute onset of slurred speech, vertigo, since November 7th 2014, also has gait difficulty,  CAT scan of the brain without contrast in November 2014 showed moderate for age nonspecific cerebral white matter changes  She was also referred for the brain MRI showed multiple scattered , discrete and confluent lesions at periventricular, periatrial, peri-callosal brainstem, cranial medullary junction and upper cervical spine white matter hyperintensities on T2/flair.  Enhancing lesions in the left paramedian pons. Left posterior frontal and left parietal and tiny right frontal periventricular white matter which likely represent active demarcating plaques. multiple T1 black holes noted in the periventricular and subcortical white matter. There is mild general cerebral atrophy.  MRI scan of the cervical spine showed spinal cord hyperintensities atC2 and C5 likely represent demyelinating plaques. Contrast images show actively enhancing lesion at C2.  Laboratory evaluation showed normal or negative NMO antibody, A1c 5. 9, ANA, CPK, K53, folic acid, vitamin B1, ESR, TSH, RPR, CMP, , CBC,  CSF study show oligoclonal banding four,, normal meyelin basic protein. ACE was normal 8,   WBC 0, RBC 6, glucose 59, total protein 43,   She moved to [place] around 2010, In 2001, she had one episode of sudden right visual loss, reported extensive evaluation at that time, was diagnosed with right "eye problem"have received the by mouth steroid, intraocular steroid injection, per patient MRI of the brain was done at  that time too.  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  JC virus titer was positive, 0.66, repeat titer 0.60 in 08/10/2014  we have reviewed her MRI of brain, and cervical scan again, findings are consistent with an active relapsing remitting multiple sclerosis,  After discuss with patient, and her daughter, we decided to proceed with Dwyane Dee IV infusion started since January 29 2014,  she understands the potential risks, including PML infection, but with her low JC virus  titer, she has a less than 0. 10/998 chance of getting PML in the first 24 months, we are monitoring her JC virus antibody titer every 3 months, repeating MRI of brain every to 6 months.  She also agrees to enroll in baclofen extended release trial, because she continues to have bilateral upper and lower extremity muscle spasticity.  UPDATE Oct 2nd 2015:  She was enrolled in [employer] baclofen extended release study since May 24 2014,, tolerating the medication well, on last visit in September 24th 2015, her baclofen dosage was increased from 20 mg, to:30 milligrams daily, she tolerated the medication very well, reported continued improvement, left bilaterally hands muscle spasm, last bilateral lower extremity muscle spasm, she is able  to squeeze them and again, she fell a few days ago, has right lateral thigh area proves, achy pain,  UPDATE Nov 6th 2015:  A week before her most recent  Tysarbri infusion in October seventh 2015, she felt right arm, and leg weakness,  increased gait difficulty, fatigue, getting worse since October seventh 2015, which has been persistent, she had quit driving about 2 weeks ago, no confident in her right leg controlling the gas paddles.  We have reviewed repeat MRI of the brain together,there was evidence of Moderate-severe periventricular and subcortical and pontine and upper cervical cord chronic demyelinating disease. Mild diffuse and severe corpus callosum atrophy.  No abnormal lesions are seen on post contrast views. Compared to MRI on 09/21/13 and 10/11/13, there is a new chronic plaque (73m) in the left basal ganglia (series 17, image 13). Also prior acute plaques no longer enhance.  This new lesions involving left basal ganglion, likely explaining for her acute worsening of right side weakness, could indicating a MS flareup,  REVIEW OF SYSTEMS: Full 14 system review of systems performed and notable only for  gait difficulty, right lateral thigh pain  ALLERGIES: No Known Allergies  HOME MEDICATIONS: Outpatient Prescriptions Prior to Visit  Medication Sig Dispense Refill  . atorvastatin (LIPITOR) 20 MG tablet Take 1 tablet (20 mg total) by mouth daily.  90 tablet  3  . estradiol (ESTRACE) 0.5 MG tablet Take 1 tablet (0.5 mg total) by mouth daily.  90 tablet  3  . glucosamine-chondroitin 500-400 MG tablet Take 1 tablet by mouth 2 (two) times daily with a meal.      . hydrochlorothiazide (HYDRODIURIL) 25 MG tablet take 1 tablet by mouth once daily  90 tablet  1  . medroxyPROGESTERone (PROVERA) 2.5 MG tablet Take 1 tablet (2.5 mg total) by mouth daily.  90 tablet  4  . meloxicam (MOBIC) 15 MG tablet take 1 tablet by mouth once daily  90 tablet  1  . traZODone (DESYREL) 50 MG tablet TAKE 1/2 TO 1 TABLET AT BEDTIME AS NEEDED FOR SLEEP.  30 tablet  2  . varenicline (CHANTIX) 1 MG tablet Take 1 tablet (1 mg total) by mouth 2 (two) times daily.  60 tablet  5  . zolpidem (AMBIEN) 10 MG tablet take 1 tablet by mouth at bedtime  if needed  30 tablet  0     PAST MEDICAL HISTORY: Past Medical History  Diagnosis Date  . Elevated cholesterol   . Hypertension   . Arthritis   . Cervical dysplasia   . Depression   . Anxiety   . Macular degeneration of right eye 2001    PAST SURGICAL HISTORY: Past Surgical History  Procedure Laterality Date  . Colposcopy    . Knee surgery    . Cholecystectomy    . Tubal ligation      FAMILY HISTORY: Family History  Problem Relation Age of Onset  . Cancer Mother     Colon  . Diabetes Mother   . Hypertension Mother   . Arthritis Mother   . Cancer Father     prostate  . Kidney disease Brother     congenital single kidney  . Arthritis Sister   . Arthritis Sister   . Hematuria Son   . Gout Brother   . Multiple sclerosis Brother   . Arthritis Brother   . HIV Brother   . Cancer Brother     spinal    SOCIAL HISTORY:  History  Social History  . Marital Status: Widowed    Spouse Name: N/A    Number of Children: 2  . Years of Education: college   Occupational History  . retired     Social History Main Topics  . Smoking status: Current Some Day Smoker -- 1.00 packs/day for 45 years    Types: Cigarettes    Last Attempt to Quit: 01/21/2013  . Smokeless tobacco: Never Used  . Alcohol Use: 3.6 oz/week    6 Cans of beer per week  . Drug Use: No  . Sexual Activity: No   Other Topics Concern  . Not on file   Social History Narrative   Grew up in [place] area, 1 of 10 siblings - 95 still living, married for 33 years then divorced, moved to [place] to be near daughter post retirement, als has 1 son. 2 dogs.       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     PHYSICAL EXAM   Filed Vitals:   08/10/14 1217  BP: 100/69  Pulse: 65  Height: 5' (1.524 m)  Weight: 124 lb (56.246 kg)    Not recorded      Body mass index is 24.22 kg/(m^2).   Generalized: In no acute distress  Neck: Supple, no carotid bruits   Cardiac:  Regular rate rhythm  Pulmonary: Clear to auscultation bilaterally  Musculoskeletal: No deformity  Neurological examination  Mentation: Alert oriented to time, place, history taking, and causual conversation  Cranial nerve II-XII: Pupils were equal round reactive to light extraocular movements were full, Visual field were full on confrontational test. Bilateral fundi were sharp.  Facial sensation and strength were normal. Hearing was intact to finger rubbing bilaterally. Uvula tongue midline.  head turning and shoulder shrug and were normal and symmetric.Tongue protrusion into cheek strength was normal.  Motor: moderate right arm, and leg weakness, fixation of right arm up on rapid rotating movement, 4 minus out of 5, at right proximal, and distal upper extremity, right hip flexion 4 minus, ankle dorsi flexion 4 minus  Sensory: Intact to fine touch, pinprick, preserved vibratory sensation, and proprioception at toes.  Coordination: she has mild trunk ataxia, mild left finger to nose, mild bilateral heel-to-shin dysmetria  Gait:  Unsteady, dragging her right leg  Deep tendon reflexes: Brachioradialis 2/2, biceps 2/2, triceps 2/2, patellar 2/2, Achilles 2/2, plantar response was extensor on the right side   DIAGNOSTIC DATA (LABS, IMAGING, TESTING) - I reviewed patient records, labs, notes, testing and imaging myself where available.  Lab Results  Component Value Date   WBC 5.3 12/29/2012   HGB 14.2 12/29/2012   HCT 42.0 12/29/2012   MCV 86.4 12/29/2012   PLT 242 12/29/2012      Component Value Date/Time   NA 141 06/29/2013 1501   K 3.7 06/29/2013 1501   CL 104 06/29/2013 1501   CO2 25 06/29/2013 1501   GLUCOSE 85 06/29/2013 1501   BUN 11 06/29/2013 1501   CREATININE 0.89 06/29/2013 1501   CREATININE 0.77 04/21/2011 0901   CALCIUM 10.0 06/29/2013 1501   PROT 8.3 06/29/2013 1501   ALBUMIN 4.7 06/29/2013 1501   AST 16 06/29/2013 1501   ALT 18 06/29/2013 1501   ALKPHOS 82  06/29/2013 1501   BILITOT 0.4 06/29/2013 1501   GFRNONAA >60 04/21/2011 0901   GFRAA >60 04/21/2011 0901   Lab Results  Component Value Date   CHOL 168 06/29/2013   HDL 70  06/29/2013   LDLCALC 85 06/29/2013   TRIG 63 06/29/2013   CHOLHDL 2.4 06/29/2013   Lab Results  Component Value Date   HGBA1C 5.9* 09/05/2013   Lab Results  Component Value Date   QXIHWTUU82 800 09/05/2013   Lab Results  Component Value Date   TSH 0.463 09/05/2013    ASSESSMENT AND PLAN   63 years old [place] American female, with diagnosis of relapsing meeting multiple sclerosis,Presented with previous optic neuritis, gait difficulty, recent worsening since and of September 2015,  Repeat MRI of the brain showed new lesion at left basal ganglia/internal capsule region, in the setting of active extensive lesions involving brain, and cervical.  She has been treated with Tysarbri infusion since January 29 2014, tolerating medication well,She is JC virus positive, with titer of 0.66 in February 2015,  She was enrolled in [facility] Baclofen ER since August 20th 2015, now taking 1m since Oct 2nd 2015, she reported symptomatic improvement with higher dosage, no significant side effects,  Keep Baclofen er 467mqday. Medro pack  RTC in 2 weeks. Repeat Jc-virus titer TyDwyane Deentibody    YiMarcial PacasM.D. Ph.D.  [facility] [address] [phone] according to protocol

## 2014-08-13 ENCOUNTER — Telehealth: Payer: Self-pay

## 2014-08-13 ENCOUNTER — Telehealth: Payer: Self-pay | Admitting: Neurology

## 2014-08-13 ENCOUNTER — Other Ambulatory Visit: Payer: Self-pay | Admitting: Neurology

## 2014-08-13 DIAGNOSIS — G35 Multiple sclerosis: Secondary | ICD-10-CM

## 2014-08-13 NOTE — Telephone Encounter (Signed)
Dana/Donna, Please make sure that patient has anti-tysarbri antibody draw, in the system, that order was cancelled.

## 2014-08-13 NOTE — Telephone Encounter (Signed)
Dee called from From [employer] stay. Please have Dr. Krista Blue Place order. Dee [phone].

## 2014-08-14 NOTE — Telephone Encounter (Signed)
Wanda [name] is working on order, blood was drawn.

## 2014-08-15 ENCOUNTER — Encounter (HOSPITAL_COMMUNITY)
Admission: RE | Admit: 2014-08-15 | Discharge: 2014-08-15 | Disposition: A | Discharge status: Home / Self Care | Payer: Federal, State, Local not specified - PPO | Source: Ambulatory Visit | Attending: Neurology | Admitting: Neurology

## 2014-08-15 ENCOUNTER — Encounter (HOSPITAL_COMMUNITY): Payer: Self-pay

## 2014-08-15 VITALS — BP 115/69 | HR 62 | Temp 98.4°F | Resp 18

## 2014-08-15 DIAGNOSIS — G35 Multiple sclerosis: Secondary | ICD-10-CM | POA: Diagnosis present

## 2014-08-15 MED ORDER — ACETAMINOPHEN 500 MG PO TABS
1000.0000 mg | ORAL_TABLET | ORAL | Status: DC
  Administered 2014-08-15: 1000 mg via ORAL
  Filled 2014-08-15: qty 2

## 2014-08-15 MED ORDER — SODIUM CHLORIDE 0.9 % IV SOLN
300.0000 mg | INTRAVENOUS | Status: DC
  Administered 2014-08-15: 300 mg via INTRAVENOUS
  Filled 2014-08-15: qty 15

## 2014-08-15 MED ORDER — LORATADINE 10 MG PO TABS
10.0000 mg | ORAL_TABLET | ORAL | Status: DC
  Administered 2014-08-15: 10 mg via ORAL
  Filled 2014-08-15: qty 1

## 2014-08-15 MED ORDER — SODIUM CHLORIDE 0.9 % IV SOLN
INTRAVENOUS | Status: DC
  Administered 2014-08-15: 09:00:00 via INTRAVENOUS

## 2014-08-15 NOTE — Discharge Instructions (Signed)

## 2014-08-15 NOTE — Progress Notes (Signed)
Post-infusion Tysabri, pt. Stayed 20 minutes post-infusion, pt. To follow-up with doctor with any problems.

## 2014-08-16 NOTE — Telephone Encounter (Signed)
Done Dr.Yan saw Labs.

## 2014-08-22 ENCOUNTER — Ambulatory Visit (INDEPENDENT_AMBULATORY_CARE_PROVIDER_SITE_OTHER): Payer: Self-pay | Admitting: Neurology

## 2014-08-22 DIAGNOSIS — G35 Multiple sclerosis: Secondary | ICD-10-CM

## 2014-08-22 MED ORDER — MODAFINIL 100 MG PO TABS: 100.0000 mg | ORAL_TABLET | Freq: Two times a day (BID) | ORAL | Status: DC

## 2014-08-22 NOTE — Progress Notes (Addendum)
PATIENT: Wanda [name] DOB: 10-May-1951  HISTORICAL   INITIAL VISIT In FEB 2015  Wanda [name] is accompanied by her daughter, Wanda [name], she was initially evaluated by Dr Wanda [name] in December 2nd 2014, referred by her primary care Dr.Jeffrey  Wanda [name] is a 63 years old right-handed African American female, with past medical history of hypertension, hyperlipidemia, longtime smoker  She presents with acute onset of slurred speech, vertigo, since November 7th 2014, also has gait difficulty,  CAT scan of the brain without contrast in November 2014 showed moderate for age nonspecific cerebral white matter changes  MRI showed multiple scattered , discrete and confluent lesions at periventricular, periatrial, peri-callosal brainstem, cranial medullary junction and upper cervical spine white matter hyperintensities on T2/flair.  Enhancing lesions in the left paramedian pons. Left posterior frontal and left parietal and tiny right frontal periventricular white matter which likely represent active demarcating plaques. multiple T1 black holes noted in the periventricular and subcortical white matter. There is mild general cerebral atrophy.  MRI scan of the cervical spine showed spinal cord hyperintensities at C2 and C5 likely represent demyelinating plaques. Contrast images show actively enhancing lesion at C2.  Laboratory evaluation showed normal or negative NMO antibody, A1c 5. 9, ANA, CPK, Z79, folic acid, vitamin B1, ESR, TSH, RPR, CMP, , CBC,  CSF study show oligoclonal banding four,, normal meyelin basic protein. ACE was normal 8,   WBC 0, RBC 6, glucose 59, total protein 43,   JC virus titer was positive, 0.66, repeat titer 0.60 in 08/10/2014  She moved to [place] around 2010, In 2001, she had one episode of sudden right visual loss, reported extensive evaluation at that time, was diagnosed with right "eye problem"have received the by mouth steroid, intraocular steroid injection, per  patient MRI of the brain was done at that time too.  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 10/998 chance of getting PML in the first 24 months, we are monitoring her JC virus antibody titer every 3 months, repeating MRI of brain every to 6 months.  She also agrees to enroll in baclofen extended release trial, because she continues to have bilateral upper and lower extremity muscle spasticity.  UPDATE Oct 2nd 2015:  She was enrolled in Wanda [name] baclofen extended release study since May 24 2014,, tolerating the medication well, on last visit in September 24th 2015, her baclofen dosage was increased from 20 mg, to 30 milligrams daily, she tolerated the medication very well, reported continued improvement, less  bilaterally hands muscle spasm, less bilateral lower extremity muscle spasm, she is able to squeeze them and again, she fell a few days ago, has right lateral thigh area proves, achy pain,  UPDATE Nov 6th 2015: A week before her most recent  Wanda [name] infusion in October seventh 2015, she felt right arm, and leg weakness, increased gait difficulty, fatigue, getting worse since October seventh 2015, which has been persistent, she had quit driving about 2 weeks ago,  no confident in her right leg controlling the gas paddles.  We have reviewed repeat MRI of the brain together,there was evidence of Moderate-severe periventricular and subcortical and pontine and upper cervical cord chronic demyelinating  disease. Mild diffuse and severe corpus callosum atrophy.  No abnormal lesions are seen on post contrast views. Compared to MRI on 09/21/13 and 10/11/13, there is a new chronic plaque (18m) in the left basal ganglia (series 17, image 13). Also prior acute plaques no longer enhance.  This new lesions involving left basal ganglion, likely explaining for her acute worsening of right side weakness, could indicating a MS flareup,  UPDATE Nov 18th 2015:  She is here for some formal follow-up according to protocol, currently taking baclofen ER 40 mg every day without significant side effect, she was treated with Wanda [name] since last visit in August 10 2014, reported improvement of her right side weakness, but still not back to her baseline, could not drive, because of lack of control of right ankle movement  She also complains of excessive fatigue,  Anti-Wanda [name] Antibody negative in Nov 2015  REVIEW OF SYSTEMS: Full 14 system review of systems performed and notable only for  gait difficulty, right side weakness  ALLERGIES: No Known Allergies  HOME MEDICATIONS: Outpatient Prescriptions Prior to Visit  Medication Sig Dispense Refill  . atorvastatin (LIPITOR) 20 MG tablet Take 1 tablet (20 mg total) by mouth daily.  90 tablet  3  . estradiol (ESTRACE) 0.5 MG tablet Take 1 tablet (0.5 mg total) by mouth daily.  90 tablet  3  . glucosamine-chondroitin 500-400 MG tablet Take 1 tablet by mouth 2 (two) times daily with a meal.      . hydrochlorothiazide (HYDRODIURIL) 25 MG tablet take 1 tablet by mouth once daily  90 tablet  1  . medroxyPROGESTERone (PROVERA) 2.5 MG tablet Take 1 tablet (2.5 mg total) by mouth daily.  90 tablet  4  . meloxicam (MOBIC) 15 MG tablet take 1 tablet by mouth once daily  90 tablet  1  . traZODone (DESYREL) 50 MG tablet TAKE 1/2 TO 1 TABLET AT BEDTIME AS NEEDED FOR SLEEP.  30 tablet  2  . varenicline (CHANTIX) 1 MG tablet Take 1 tablet (1 mg total) by mouth 2 (two) times  daily.  60 tablet  5  . zolpidem (AMBIEN) 10 MG tablet take 1 tablet by mouth at bedtime if needed  30 tablet  0     PAST MEDICAL HISTORY: Past Medical History  Diagnosis Date  . Elevated cholesterol   . Hypertension   . Arthritis   . Cervical dysplasia   . Depression   . Anxiety   . Macular degeneration of right eye 2001    PAST SURGICAL HISTORY: Past Surgical History  Procedure Laterality Date  . Colposcopy    . Knee surgery    . Cholecystectomy    . Tubal ligation      FAMILY HISTORY: Family History  Problem Relation Age of Onset  . Cancer Mother     Colon  . Diabetes Mother   . Hypertension Mother   . Arthritis Mother   . Cancer Father     prostate  . Kidney disease Brother     congenital single kidney  . Arthritis Sister   . Arthritis Sister   . Hematuria Son   . Gout Brother   . Multiple sclerosis Brother   . Arthritis Brother   . HIV Brother   . Cancer Brother  spinal    SOCIAL HISTORY:  History   Social History  . Marital Status: Widowed    Spouse Name: N/A    Number of Children: 2  . Years of Education: college   Occupational History  . retired     Social History Main Topics  . Smoking status: Current Some Day Smoker -- 1.00 packs/day for 45 years    Types: Cigarettes    Last Attempt to Quit: 01/21/2013  . Smokeless tobacco: Never Used  . Alcohol Use: 3.6 oz/week    6 Cans of beer per week  . Drug Use: No  . Sexual Activity: No   Other Topics Concern  . Not on file   Social History Narrative   Grew up in [place] area, 1 of 10 siblings - 49 still living, married for 33 years then divorced, moved to [place] to be near daughter post retirement, als has 1 son. 2 dogs.       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     PHYSICAL EXAM   There were no vitals filed for this visit.  Not recorded      There is no weight on file to calculate BMI.   Generalized: In no acute distress  Neck: Supple, no  carotid bruits   Cardiac: Regular rate rhythm  Pulmonary: Clear to auscultation bilaterally  Musculoskeletal: No deformity  Neurological examination  Mentation: Alert oriented to time, place, history taking, and causual conversation  Cranial nerve II-XII: Pupils were equal round reactive to light extraocular movements were full, Visual field were full on confrontational test. Bilateral fundi were sharp.  Facial sensation and strength were normal. Hearing was intact to finger rubbing bilaterally. Uvula tongue midline.  head turning and shoulder shrug and were normal and symmetric.Tongue protrusion into cheek strength was normal.  Motor: moderate right arm, and leg weakness, fixation of right arm on rapid rotating movement, 4 minus out of 5, at right proximal, and distal upper extremity, right hip flexion 4 minus, ankle dorsi flexion 3, right ankle plantar flexion 4  Sensory: Intact to fine touch, pinprick, preserved vibratory sensation, and proprioception at toes.  Coordination: she has mild trunk ataxia, mild left finger to nose, mild bilateral heel-to-shin dysmetria  Gait:  Unsteady, dragging her right leg, hold right arm in elbow flexion pronation position  Deep tendon reflexes: Brachioradialis 2/2, biceps 2/2, triceps 2/2, patellar 2/2, Achilles 2/2, plantar response was extensor on the right side   DIAGNOSTIC DATA (LABS, IMAGING, TESTING) - I reviewed patient records, labs, notes, testing and imaging myself where available.  Lab Results  Component Value Date   WBC 6.9 08/10/2014   HGB 12.9 08/10/2014   HCT 37.2 08/10/2014   MCV 85 08/10/2014   PLT 182 08/10/2014      Component Value Date/Time   NA 136 08/10/2014 1304   NA 141 06/29/2013 1501   K 3.6 08/10/2014 1304   CL 99 08/10/2014 1304   CO2 21 08/10/2014 1304   GLUCOSE 90 08/10/2014 1304   GLUCOSE 85 06/29/2013 1501   BUN 15 08/10/2014 1304   BUN 11 06/29/2013 1501   CREATININE 1.00 08/10/2014 1304   CREATININE  0.89 06/29/2013 1501   CALCIUM 9.2 08/10/2014 1304   PROT 7.2 08/10/2014 1304   PROT 8.3 06/29/2013 1501   ALBUMIN 4.7 06/29/2013 1501   AST 16 08/10/2014 1304   ALT 17 08/10/2014 1304   ALKPHOS 73 08/10/2014 1304   BILITOT 0.6  08/10/2014 1304   GFRNONAA 60 08/10/2014 1304   GFRAA 69 08/10/2014 1304   Lab Results  Component Value Date   CHOL 168 06/29/2013   HDL 70 06/29/2013   LDLCALC 85 06/29/2013   TRIG 63 06/29/2013   CHOLHDL 2.4 06/29/2013   Lab Results  Component Value Date   HGBA1C 5.9* 09/05/2013   Lab Results  Component Value Date   XAJOINOM76 720 09/05/2013   Lab Results  Component Value Date   TSH 0.463 09/05/2013    ASSESSMENT AND PLAN   63 years old [place] American female, with diagnosis of relapsing meeting multiple sclerosis,Presented with previous optic neuritis, gait difficulty, recent worsening since and of September 2015,  Repeat MRI of the brain showed new lesion at left basal ganglia/internal capsule region, in the setting of active extensive lesions involving brain, and cervical.  She has been treated with Wanda [name] infusion since January 29 2014, tolerating medication well,She is JC virus positive, with titer of 0.66 in February 2015,  She was enrolled in [facility] Baclofen ER since August 20th 2015, now taking 64m since Oct 2nd 2015, she reported symptomatic improvement with higher dosage, no significant side effects,  Keep Baclofen er 494mqday.  Repeat Jc-virus titer, Wanda [name] antibody Start Provigil 100 mg twice a day   YiMarcial PacasM.D. Ph.D.  [facility] [address] [phone] according to protocol

## 2014-08-23 ENCOUNTER — Telehealth: Payer: Self-pay | Admitting: Neurology

## 2014-08-23 NOTE — Telephone Encounter (Signed)
Patient calling to state that her new medication that Dr. Krista Blue gave her is not covered by her insurance, please return call and advise.

## 2014-08-23 NOTE — Telephone Encounter (Signed)
Wanda [name] was put on Provigil. [name] states she is having problems with insurance company. Stated to [name] I will forward to [name] to see if she could help. Stated to [name] 24-48 hour turn around time . [name] was sine with this process.

## 2014-08-23 NOTE — Telephone Encounter (Signed)
Medication is not covered by ins and requires Prior Auth.  I contacted ins, provided all clinical info asking that they grant an exception and cover this drug  Request is currently under review.  They will notify patient of outcome once decision is made.  I called the patient back.  She is aware.

## 2014-08-28 LAB — CBC WITH DIFFERENTIAL
Basophils Absolute: 0 10*3/uL (ref 0.0–0.2)
Basos: 0 %
EOS ABS: 0.1 10*3/uL (ref 0.0–0.4)
EOS: 2 %
HCT: 37.2 % (ref 34.0–46.6)
Hemoglobin: 12.9 g/dL (ref 11.1–15.9)
IMMATURE GRANS (ABS): 0 10*3/uL (ref 0.0–0.1)
Immature Granulocytes: 0 %
Lymphocytes Absolute: 2.7 10*3/uL (ref 0.7–3.1)
Lymphs: 39 %
MCH: 29.3 pg (ref 26.6–33.0)
MCHC: 34.7 g/dL (ref 31.5–35.7)
MCV: 85 fL (ref 79–97)
MONOCYTES: 8 %
Monocytes Absolute: 0.5 10*3/uL (ref 0.1–0.9)
NEUTROS ABS: 3.5 10*3/uL (ref 1.4–7.0)
NEUTROS PCT: 51 %
Platelets: 182 10*3/uL (ref 150–379)
RBC: 4.4 x10E6/uL (ref 3.77–5.28)
RDW: 15 % (ref 12.3–15.4)
WBC: 6.9 10*3/uL (ref 3.4–10.8)

## 2014-08-28 LAB — COMPREHENSIVE METABOLIC PANEL
A/G RATIO: 1.7 (ref 1.1–2.5)
ALT: 17 IU/L (ref 0–32)
AST: 16 IU/L (ref 0–40)
Albumin: 4.5 g/dL (ref 3.6–4.8)
Alkaline Phosphatase: 73 IU/L (ref 39–117)
BUN/Creatinine Ratio: 15 (ref 11–26)
BUN: 15 mg/dL (ref 8–27)
CALCIUM: 9.2 mg/dL (ref 8.7–10.3)
CO2: 21 mmol/L (ref 18–29)
Chloride: 99 mmol/L (ref 97–108)
Creatinine, Ser: 1 mg/dL (ref 0.57–1.00)
GFR calc Af Amer: 69 mL/min/{1.73_m2} (ref >59)
GFR calc non Af Amer: 60 mL/min/{1.73_m2} (ref >59)
Globulin, Total: 2.7 g/dL (ref 1.5–4.5)
Glucose: 90 mg/dL (ref 65–99)
POTASSIUM: 3.6 mmol/L (ref 3.5–5.2)
SODIUM: 136 mmol/L (ref 134–144)
TOTAL PROTEIN: 7.2 g/dL (ref 6.0–8.5)
Total Bilirubin: 0.6 mg/dL (ref 0.0–1.2)

## 2014-08-28 LAB — URINALYSIS
Bilirubin, UA: NEGATIVE
Glucose, UA: NEGATIVE
KETONES UA: NEGATIVE
NITRITE UA: NEGATIVE
PH UA: 6 (ref 5.0–7.5)
Protein, UA: NEGATIVE
RBC, UA: NEGATIVE
Specific Gravity, UA: 1.02 (ref 1.005–1.030)
UUROB: 0.2 mg/dL (ref 0.2–1.0)

## 2014-08-28 LAB — ANTI-TYSABRI (NATALIZUMAB) AB

## 2014-08-30 LAB — ANTI-TYSABRI (NATALIZUMAB) AB: ANTI-TYSABRI(NATALIZUMAB) AB: NEGATIVE

## 2014-09-12 ENCOUNTER — Encounter (HOSPITAL_COMMUNITY): Admission: RE | Admit: 2014-09-12 | Payer: Federal, State, Local not specified - PPO | Source: Ambulatory Visit

## 2014-09-12 ENCOUNTER — Telehealth: Payer: Self-pay | Admitting: Neurology

## 2014-09-12 NOTE — Telephone Encounter (Signed)
Wanda [name]: Please call patient, why she has missed her Tysarbri appointment

## 2014-09-12 NOTE — Telephone Encounter (Signed)
Calling to let you know patient did not show up for Tysabri at [facility] today.

## 2014-09-13 NOTE — Telephone Encounter (Signed)
Called patient back and she stated she will call [facility] to get rescheduled for her Tysabri That's all patient wanted to know.

## 2014-09-13 NOTE — Telephone Encounter (Signed)
Called patient and spoke to her she did not know she missed her apt. Patient had the wrong date down. Patient will call them to reschedule.

## 2014-09-13 NOTE — Telephone Encounter (Signed)
Patient requesting a return call.  Has additional questions about Tysarbri appointment.

## 2014-09-19 ENCOUNTER — Telehealth: Payer: Self-pay | Admitting: *Deleted

## 2014-09-19 NOTE — Telephone Encounter (Signed)
Mailed patient records on 09/19/14.

## 2014-10-02 ENCOUNTER — Encounter (HOSPITAL_COMMUNITY)
Admission: RE | Admit: 2014-10-02 | Discharge: 2014-10-02 | Disposition: A | Discharge status: Home / Self Care | Payer: Federal, State, Local not specified - PPO | Source: Ambulatory Visit | Attending: Neurology | Admitting: Neurology

## 2014-10-02 ENCOUNTER — Encounter (HOSPITAL_COMMUNITY): Payer: Self-pay

## 2014-10-02 VITALS — BP 97/67 | HR 73 | Temp 98.0°F | Resp 20 | Ht 60.0 in | Wt 127.0 lb

## 2014-10-02 DIAGNOSIS — G35 Multiple sclerosis: Secondary | ICD-10-CM | POA: Diagnosis not present

## 2014-10-02 MED ORDER — LORATADINE 10 MG PO TABS
10.0000 mg | ORAL_TABLET | ORAL | Status: DC
  Administered 2014-10-02: 10 mg via ORAL
  Filled 2014-10-02: qty 1

## 2014-10-02 MED ORDER — SODIUM CHLORIDE 0.9 % IV SOLN
300.0000 mg | INTRAVENOUS | Status: DC
  Administered 2014-10-02: 300 mg via INTRAVENOUS
  Filled 2014-10-02: qty 15

## 2014-10-02 MED ORDER — ACETAMINOPHEN 500 MG PO TABS
1000.0000 mg | ORAL_TABLET | ORAL | Status: DC
  Administered 2014-10-02: 1000 mg via ORAL
  Filled 2014-10-02: qty 2

## 2014-10-02 MED ORDER — SODIUM CHLORIDE 0.9 % IV SOLN
INTRAVENOUS | Status: DC
  Administered 2014-10-02: 13:00:00 via INTRAVENOUS

## 2014-10-02 NOTE — Progress Notes (Signed)
Post-Infusion Tysabri, pt. Stayed 15 minutes post-infusion, pt. To follow-up with doctor with any problems. 

## 2014-10-10 ENCOUNTER — Encounter (HOSPITAL_COMMUNITY): Payer: Federal, State, Local not specified - PPO

## 2014-10-30 ENCOUNTER — Encounter (HOSPITAL_COMMUNITY): Payer: Self-pay

## 2014-10-30 ENCOUNTER — Telehealth: Payer: Self-pay | Admitting: Neurology

## 2014-10-30 ENCOUNTER — Encounter (HOSPITAL_COMMUNITY)
Admission: RE | Admit: 2014-10-30 | Discharge: 2014-10-30 | Disposition: A | Discharge status: Home / Self Care | Payer: Federal, State, Local not specified - PPO | Source: Ambulatory Visit | Attending: Neurology | Admitting: Neurology

## 2014-10-30 VITALS — BP 124/77 | HR 62 | Temp 97.8°F | Resp 16 | Ht 60.0 in | Wt 123.0 lb

## 2014-10-30 DIAGNOSIS — G35 Multiple sclerosis: Secondary | ICD-10-CM | POA: Insufficient documentation

## 2014-10-30 MED ORDER — SODIUM CHLORIDE 0.9 % IV SOLN
INTRAVENOUS | Status: AC
  Administered 2014-10-30: 08:00:00 via INTRAVENOUS

## 2014-10-30 MED ORDER — ACETAMINOPHEN 500 MG PO TABS
1000.0000 mg | ORAL_TABLET | ORAL | Status: AC
  Administered 2014-10-30: 1000 mg via ORAL
  Filled 2014-10-30: qty 2

## 2014-10-30 MED ORDER — LORATADINE 10 MG PO TABS
10.0000 mg | ORAL_TABLET | ORAL | Status: AC
  Administered 2014-10-30: 10 mg via ORAL
  Filled 2014-10-30: qty 1

## 2014-10-30 MED ORDER — SODIUM CHLORIDE 0.9 % IV SOLN
300.0000 mg | INTRAVENOUS | Status: AC
  Administered 2014-10-30: 300 mg via INTRAVENOUS
  Filled 2014-10-30: qty 15

## 2014-10-30 NOTE — Telephone Encounter (Signed)
Patient stated she had Infusion today and experiencing right sided head pain.  Questioning if she could take a Baclofen?  Please call and advise.

## 2014-10-30 NOTE — Progress Notes (Signed)
Patient will not stay for 1 post observation. No immediate reactions post tysabri. Will contact MD for any problems

## 2014-10-30 NOTE — Telephone Encounter (Signed)
Wanda [name], please check on patient's symptoms, she may take baclofen for muscle spasticity, I do not see getting her current medication list

## 2014-11-02 NOTE — Telephone Encounter (Signed)
Spoke to [employer] - she has taking been taking Baclofen 10mg , one tab at bedtime which helps.  Her bigger concern is having difficulty with going to sleep.  She is requesting a higher dose of Ambien.  She is currently taking 10mg  at qhs.

## 2014-11-03 ENCOUNTER — Other Ambulatory Visit: Payer: Self-pay | Admitting: Physician Assistant

## 2014-11-05 NOTE — Telephone Encounter (Signed)
We did get a prior auth approval for Provigil through ins.  I recommend she contact the Rx Outreach program at either [email or web address] or [phone].  They offer this med at a lower cost for those who qualify.  I called the patient, got no answer.  Left message relaying this info.

## 2014-11-05 NOTE — Telephone Encounter (Signed)
I have called Wanda [name], she had Rx of ambien 10mg  qhs since Sep 2015,   She has some reaction after Tysarbri infusion in Jan 26th with right side headace insomnia, ibuprofone prn was helpful, she is now back to her baseline  She could not afford the co-pay of provigil, which works very well for her fatigue,  Return to clinic in November 28 2014  Jessica: We will do please check on to see if she can get any help on provigil

## 2014-11-07 ENCOUNTER — Encounter (HOSPITAL_COMMUNITY): Payer: Federal, State, Local not specified - PPO

## 2014-11-22 ENCOUNTER — Telehealth: Payer: Self-pay | Admitting: Neurology

## 2014-11-22 ENCOUNTER — Other Ambulatory Visit: Payer: Self-pay | Admitting: Neurology

## 2014-11-22 DIAGNOSIS — G35 Multiple sclerosis: Secondary | ICD-10-CM

## 2014-11-22 NOTE — Telephone Encounter (Signed)
Left message for Wanda [name] to return call.  

## 2014-11-22 NOTE — Telephone Encounter (Signed)
Dee with [facility] short stay @ [phone], requesting Tysabri orders.  Please call and advise.

## 2014-11-23 NOTE — Telephone Encounter (Signed)
Per Karena Addison, Dr. Krista Blue has already placed orders for Tysabri.

## 2014-11-27 ENCOUNTER — Encounter (HOSPITAL_COMMUNITY)
Admission: RE | Admit: 2014-11-27 | Discharge: 2014-11-27 | Disposition: A | Discharge status: Home / Self Care | Payer: Federal, State, Local not specified - PPO | Source: Ambulatory Visit | Attending: Neurology | Admitting: Neurology

## 2014-11-27 ENCOUNTER — Encounter (HOSPITAL_COMMUNITY): Payer: Self-pay

## 2014-11-27 ENCOUNTER — Encounter: Payer: Self-pay | Admitting: Gastroenterology

## 2014-11-27 VITALS — BP 122/69 | HR 79 | Temp 97.8°F | Resp 18 | Ht 60.0 in | Wt 123.0 lb

## 2014-11-27 DIAGNOSIS — G35 Multiple sclerosis: Secondary | ICD-10-CM | POA: Insufficient documentation

## 2014-11-27 MED ORDER — SODIUM CHLORIDE 0.9 % IV SOLN
INTRAVENOUS | Status: DC
  Administered 2014-11-27: 09:00:00 via INTRAVENOUS

## 2014-11-27 MED ORDER — ACETAMINOPHEN 500 MG PO TABS
1000.0000 mg | ORAL_TABLET | ORAL | Status: DC
  Administered 2014-11-27: 1000 mg via ORAL
  Filled 2014-11-27: qty 2

## 2014-11-27 MED ORDER — SODIUM CHLORIDE 0.9 % IV SOLN
300.0000 mg | INTRAVENOUS | Status: DC
  Administered 2014-11-27: 300 mg via INTRAVENOUS
  Filled 2014-11-27: qty 15

## 2014-11-27 MED ORDER — LORATADINE 10 MG PO TABS
10.0000 mg | ORAL_TABLET | ORAL | Status: DC
  Administered 2014-11-27: 10 mg via ORAL
  Filled 2014-11-27: qty 1

## 2014-11-27 NOTE — Discharge Instructions (Signed)

## 2014-11-28 ENCOUNTER — Ambulatory Visit (INDEPENDENT_AMBULATORY_CARE_PROVIDER_SITE_OTHER): Payer: Self-pay | Admitting: Neurology

## 2014-11-28 VITALS — BP 134/82 | HR 76 | Temp 98.6°F | Resp 16

## 2014-11-28 DIAGNOSIS — G35 Multiple sclerosis: Secondary | ICD-10-CM

## 2014-11-28 DIAGNOSIS — R269 Unspecified abnormalities of gait and mobility: Secondary | ICD-10-CM

## 2014-11-28 MED ORDER — CLONAZEPAM 1 MG PO TABS: 1.0000 mg | ORAL_TABLET | Freq: Two times a day (BID) | ORAL | Status: DC | PRN

## 2014-11-28 NOTE — Progress Notes (Signed)
Wanda [name] (DOB: [id number]) was seen today for Visit 5 for the [employer] [id number] Research Trial: A Clinical Evaluation Of The Safety of Baclofen ER Capsules (GRS) When Administered Once Daily To Subjects With Spasticity Due To Multiple Sclerosis (MS): An Open Label, Long Term, Safety Trial. The patient was accompanied by her daughter.  Since the last visit, patient stated that there were not new changes in her medications or any adverse events. Vitals signs were found to be non-clinically-significant. C-SSRS was performed by [facility] Shea Evans; C-SSRS did not exhibit significant findings. Patient returned medicine (7 pills returned) and was found to be compliant. Diary pages were collected and new ones were dispensed. In the diary page, patient reported an upper left arm stiffness; therefore, a dose change was required (from 40 mg to 50 mg). A 12 lead ECG was also performed in triplicate at two minute intervals: 10:30h, 10:32h, and 10:34h. The Principal Investigator, Dr. Marcial Pacas, performed complete physical and neurological examinations. Clinical laboratory tests were performed. Study medicine was dispensed (3 bottles of Baclofen ER 50 mg each).  Subject Diary and dosing instructions were reviewed with the patient. Patient was reminded to minimize alcohol consumption if applicable and take the study medication at the same time of day 30 minutes after the evening meal with a full glass of water. A 3 month follow up visit was scheduled for [id number] and as per Dr. Rhea Belton request: an unscheduled visit was scheduled for [id number].

## 2014-11-28 NOTE — Progress Notes (Signed)
PATIENT: Wanda [name] DOB: 06/16/1951  HISTORICAL   INITIAL VISIT In FEB 2015  Wanda [name] is accompanied by her daughter, [name], she was initially evaluated by Dr Rexene Alberts in December 2nd 2014, referred by her primary care Dr.Jeffrey  Ms. Nall is a 64 years old right-handed African American female, with past medical history of hypertension, hyperlipidemia, longtime smoker  She presents with acute onset of slurred speech, vertigo, since November 7th 2014, also has gait difficulty,  CAT scan of the brain without contrast in November 2014 showed moderate for age nonspecific cerebral white matter changes  MRI showed multiple scattered , discrete and confluent lesions at periventricular, periatrial, peri-callosal brainstem, cranial medullary junction and upper cervical spine white matter hyperintensities on T2/flair.  Enhancing lesions in the left paramedian pons. Left posterior frontal and left parietal and tiny right frontal periventricular white matter which likely represent active demarcating plaques. multiple T1 black holes noted in the periventricular and subcortical white matter. There is mild general cerebral atrophy.  MRI scan of the cervical spine showed spinal cord hyperintensities at C2 and C5 likely represent demyelinating plaques. Contrast images show actively enhancing lesion at C2.  Laboratory evaluation showed normal or negative NMO antibody, A1c 5. 9, ANA, CPK, Z22, folic acid, vitamin B1, ESR, TSH, RPR, CMP, , CBC,  CSF study show oligoclonal banding four,, normal meyelin basic protein. ACE was normal 8,   WBC 0, RBC 6, glucose 59, total protein 43,   JC virus titer was positive, 0.66, repeat titer 0.60 in 08/10/2014  She moved to [place] around 2010, In 2001, she had one episode of sudden right visual loss, reported extensive evaluation at that time, was diagnosed with right "eye problem"have received the by mouth steroid, intraocular steroid injection, per  patient MRI of the brain was done at that time too.  Her right vision never came back to normal, it was blurry, she can only read large print with the right eye  Before this episode, she was highly functional, she retired as a Software engineer, lives independently, no gait difficulty, exercise regularly, driving,   After discuss with patient, and her daughter, she has active form of RRMS, we decided to proceed with [facility] IV infusion started since January 29 2014,  she understands the potential risks, including PML infection, but with her low JC virus  titer, she has a less than 0. 10/998 chance of getting PML in the first 24 months, we are monitoring her JC virus antibody titer every 3 months, repeating MRI of brain every to 6 months.  She also agrees to enroll in baclofen extended release trial, because she continues to have bilateral upper and lower extremity muscle spasticity.  UPDATE Oct 2nd 2015:  She was enrolled in [employer] baclofen extended release study since May 24 2014,, tolerating the medication well, on last visit in September 24th 2015, her baclofen dosage was increased from 20 mg, to 30 milligrams daily, she tolerated the medication very well, reported continued improvement, less  bilaterally hands muscle spasm, less bilateral lower extremity muscle spasm, she is able to squeeze them and again, she fell a few days ago, has right lateral thigh area proves, achy pain,  UPDATE Nov 6th 2015: A week before her most recent  Tysarbri infusion in October seventh 2015, she felt right arm, and leg weakness, increased gait difficulty, fatigue, getting worse since October seventh 2015, which has been persistent, she had quit driving about 2 weeks ago,  no confident in her right leg controlling the gas paddles.  We have reviewed repeat MRI of the brain together,there was evidence of Moderate-severe periventricular and subcortical and pontine and upper cervical cord chronic demyelinating  disease. Mild diffuse and severe corpus callosum atrophy.  No abnormal lesions are seen on post contrast views. Compared to MRI on 09/21/13 and 10/11/13, there is a new chronic plaque (89m) in the left basal ganglia (series 17, image 13). Also prior acute plaques no longer enhance.  This new lesions involving left basal ganglion, likely explaining for her acute worsening of right side weakness, could indicating a MS flareup,  UPDATE Nov 18th 2015:  She is here for some formal follow-up according to protocol, currently taking baclofen ER 40 mg every day without significant side effect, she was treated with Metro pack since last visit in August 10 2014, reported improvement of her right side weakness, but still not back to her baseline, could not drive, because of lack of control of right ankle movement  She also complains of excessive fatigue,  Anti-Tysarbri Antibody negative in Nov 2015  UPDATE Feb 24th 2016: She started taking provigil 100 mg twice a day, which has been helpful, she is tolerating baclofen ER 40 mg every day, which does help her right upper and lower extremity spasticity, she continue have gait difficulty, no longer driving, low titer JC virus positive antibody, 0.6, negative Tysarbri antibody, she did has 1 flareups of right-sided weakness in October 2015, 6 months after taking Tysarbri April 2015,  She continue complains of right upper lower extremity muscle spasticity, will increase her baclofen ER to 50 mg daily  Review of system: system review of systems performed and notable only for  gait difficulty, right side weakness  ALLERGIES: No Known Allergies  HOME MEDICATIONS: Outpatient Prescriptions Prior to Visit  Medication Sig Dispense Refill  . atorvastatin (LIPITOR) 20 MG tablet Take 1 tablet (20 mg total) by mouth daily.  90 tablet  3  . estradiol (ESTRACE) 0.5 MG tablet Take 1 tablet (0.5 mg total) by mouth daily.  90 tablet  3  . glucosamine-chondroitin 500-400  MG tablet Take 1 tablet by mouth 2 (two) times daily with a meal.      . hydrochlorothiazide (HYDRODIURIL) 25 MG tablet take 1 tablet by mouth once daily  90 tablet  1  . medroxyPROGESTERone (PROVERA) 2.5 MG tablet Take 1 tablet (2.5 mg total) by mouth daily.  90 tablet  4  . meloxicam (MOBIC) 15 MG tablet take 1 tablet by mouth once daily  90 tablet  1  . traZODone (DESYREL) 50 MG tablet TAKE 1/2 TO 1 TABLET AT BEDTIME AS NEEDED FOR SLEEP.  30 tablet  2  . varenicline (CHANTIX) 1 MG tablet Take 1 tablet (1 mg total) by mouth 2 (two) times daily.  60 tablet  5  . zolpidem (AMBIEN) 10 MG tablet take 1 tablet by mouth at bedtime if needed  30 tablet  0     PAST MEDICAL HISTORY: Past Medical History  Diagnosis Date  . Elevated cholesterol   . Hypertension   . Arthritis   . Cervical dysplasia   . Depression   . Anxiety   . Macular degeneration of right eye 2001    PAST SURGICAL HISTORY: Past Surgical History  Procedure Laterality Date  . Colposcopy    . Knee surgery    . Cholecystectomy    . Tubal ligation      FAMILY HISTORY: Family History  Problem Relation Age of Onset  . Cancer Mother     Colon  . Diabetes Mother   . Hypertension Mother   . Arthritis Mother   . Cancer Father     prostate  . Kidney disease Brother     congenital single kidney  . Arthritis Sister   . Arthritis Sister   . Hematuria Son   . Gout Brother   . Multiple sclerosis Brother   . Arthritis Brother   . HIV Brother   . Cancer Brother     spinal    SOCIAL HISTORY:  History   Social History  . Marital Status: Widowed    Spouse Name: N/A    Number of Children: 2  . Years of Education: college   Occupational History  . retired     Social History Main Topics  . Smoking status: Current Some Day Smoker -- 1.00 packs/day for 45 years    Types: Cigarettes    Last Attempt to Quit: 01/21/2013  . Smokeless tobacco: Never Used  . Alcohol Use: 3.6 oz/week    6 Cans of beer per week  .  Drug Use: No  . Sexual Activity: No   Other Topics Concern  . Not on file   Social History Narrative   Grew up in [place] area, 1 of 10 siblings - 47 still living, married for 33 years then divorced, moved to [place] to be near daughter post retirement, als has 1 son. 2 dogs.       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     PHYSICAL EXAM   PHYSICAL EXAMNIATION:  Gen: NAD, conversant, well nourised, obese, well groomed                     Cardiovascular: Regular rate rhythm, no peripheral edema, warm, nontender. Eyes: Conjunctivae clear without exudates or hemorrhage Neck: Supple, no carotid bruise. Pulmonary: Clear to auscultation bilaterally   NEUROLOGICAL EXAM:  MENTAL STATUS: Speech:    Speech is normal; fluent and spontaneous with normal comprehension.  Cognition:    The patient is oriented to person, place, and time;     recent and remote memory intact;     language fluent;     normal attention, concentration,     fund of knowledge.  CRANIAL NERVES: CN II: Visual fields are full to confrontation. Fundoscopic exam is normal with sharp discs and no vascular changes. Venous pulsations are present bilaterally. Pupils are 4 mm and briskly reactive to light. Visual acuity is 20/20 bilaterally. CN III, IV, VI: extraocular movement are normal. No ptosis. CN V: Facial sensation is intact to pinprick in all 3 divisions bilaterally. Corneal responses are intact.  CN VII: Face is symmetric with normal eye closure and smile. CN VIII: Hearing is normal to rubbing fingers CN IX, X: Palate elevates symmetrically. Phonation is normal. CN XI: Head turning and shoulder shrug are intact CN XII: Tongue is midline with normal movements and no atrophy.  MOTOR: There is no pronator drift of out-stretched arms. Muscle bulk and tone are normal. Muscle strength is normal.   Shoulder abduction Shoulder external rotation Elbow flexion Elbow extension Wrist flexion  Wrist extension Finger abduction Hip flexion Knee flexion Knee extension Ankle dorsi flexion Ankle plantar flexion  R 4 4 4- 4 4 4 4 3 4 4  4- 4+  L 5 5 5 5 5 5 5 5 5 5 5  5  REFLEXES: Reflexes are 2+ and symmetric at the biceps, triceps, knees, and ankles. Plantar responses are flexor.  SENSORY: Light touch, pinprick, position sense, and vibration sense are intact in fingers and toes.  COORDINATION: Rapid alternating movements and fine finger movements are intact. There is no dysmetria on finger-to-nose and heel-knee-shin. There are no abnormal or extraneous movements.   GAIT/STANCE: Unsteady, right hemi-circumferential gait, dragging her right leg, Romberg is absent.   DIAGNOSTIC DATA (LABS, IMAGING, TESTING) - I reviewed patient records, labs, notes, testing and imaging myself where available.  Lab Results  Component Value Date   WBC 6.9 08/10/2014   HGB 12.9 08/10/2014   HCT 37.2 08/10/2014   MCV 85 08/10/2014   PLT 182 08/10/2014      Component Value Date/Time   NA 136 08/10/2014 1304   NA 141 06/29/2013 1501   K 3.6 08/10/2014 1304   CL 99 08/10/2014 1304   CO2 21 08/10/2014 1304   GLUCOSE 90 08/10/2014 1304   GLUCOSE 85 06/29/2013 1501   BUN 15 08/10/2014 1304   BUN 11 06/29/2013 1501   CREATININE 1.00 08/10/2014 1304   CREATININE 0.89 06/29/2013 1501   CALCIUM 9.2 08/10/2014 1304   PROT 7.2 08/10/2014 1304   PROT 8.3 06/29/2013 1501   ALBUMIN 4.7 06/29/2013 1501   AST 16 08/10/2014 1304   ALT 17 08/10/2014 1304   ALKPHOS 73 08/10/2014 1304   BILITOT 0.6 08/10/2014 1304   GFRNONAA 60 08/10/2014 1304   GFRAA 69 08/10/2014 1304   Lab Results  Component Value Date   CHOL 168 06/29/2013   HDL 70 06/29/2013   LDLCALC 85 06/29/2013   TRIG 63 06/29/2013   CHOLHDL 2.4 06/29/2013   Lab Results  Component Value Date   HGBA1C 5.9* 09/05/2013   Lab Results  Component Value Date   [id number] 863 09/05/2013   Lab Results  Component Value Date   TSH  0.463 09/05/2013    ASSESSMENT AND PLAN   64 years old [place] American female, with  relapsing meeting multiple sclerosis,Presented with previous optic neuritis, gait difficulty since 2014, worsening right-sided weakness since September 15, while taking Tysarbri IV infusion,  Repeat MRI of the brain showed new lesion at left basal ganglia/internal capsule region, in the setting of extensive lesions involving brain, and cervical.  She has been treated with Tysarbri infusion since January 29 2014, tolerating medication well,She is JC virus positive, with titer of 0.66 in February 2015, 0.10 August 2014,   She was enrolled in [employer] Baclofen ER 11-4, since August 20th 2015, now taking 43m since Oct 2nd 2015, she reported symptomatic improvement with higher dosage, no significant side effects,  Increase baclofen ER to 50 mg daily  Keep provigil 100 mg twice a day return to clinic in 2 weeks. Clonazepam 1 mg as needed for insomnia, stop Ambien  YMarcial Pacas M.D. Ph.D.  [facility] [address] [place]iver [place]aks[place]enn[place] [phone]

## 2014-11-29 ENCOUNTER — Other Ambulatory Visit: Payer: Self-pay | Admitting: Neurology

## 2014-12-04 ENCOUNTER — Ambulatory Visit: Payer: Self-pay

## 2014-12-04 ENCOUNTER — Telehealth: Payer: Self-pay | Admitting: Neurology

## 2014-12-04 NOTE — Telephone Encounter (Signed)
Pt is calling requesting that Dr. Krista Blue write a Rx for CHANTIX 1 MG tablet. She states she no longer has a PCP but she needs this Rx.  Please call and advise.

## 2014-12-04 NOTE — Telephone Encounter (Signed)
Patient aware that this is not a drug prescribed by our office - said she would contact her previous PCP for the refill.

## 2014-12-05 ENCOUNTER — Encounter (HOSPITAL_COMMUNITY): Payer: Federal, State, Local not specified - PPO

## 2014-12-05 ENCOUNTER — Ambulatory Visit (INDEPENDENT_AMBULATORY_CARE_PROVIDER_SITE_OTHER): Payer: Federal, State, Local not specified - PPO | Admitting: Physician Assistant

## 2014-12-05 VITALS — BP 100/60 | HR 94 | Temp 97.8°F | Resp 16 | Ht 61.0 in | Wt 124.0 lb

## 2014-12-05 DIAGNOSIS — Z720 Tobacco use: Secondary | ICD-10-CM | POA: Diagnosis not present

## 2014-12-05 DIAGNOSIS — Z114 Encounter for screening for human immunodeficiency virus [HIV]: Secondary | ICD-10-CM | POA: Diagnosis not present

## 2014-12-05 MED ORDER — VARENICLINE TARTRATE 1 MG PO TABS: 1.0000 mg | ORAL_TABLET | Freq: Two times a day (BID) | ORAL | Status: DC

## 2014-12-05 NOTE — Patient Instructions (Signed)
To start back on the Chantix: Pick a QUIT DATE at least 7 days in the future (you should be on the Chantix for 7 days before you quit). Start the Chantix by taking 1/2 tablet one time each day x 4 days,  Then 1/2 tablet twice each day x 4 days, Then 1 tablet each morning and 1/2 tablet each evening x 4 days, Then 1 tablet twice each day.

## 2014-12-05 NOTE — Progress Notes (Signed)
Patient ID: Wanda [name], female    DOB: Mar 16, 1951, 64 y.o.   MRN: [id number]  PCP: [facility]ne [facility]  Subjective:   Chief Complaint  Patient presents with  . Medication Refill    Chantix    HPI  Presents requesting Chantix. She used it successfully to quit smoking previously without adverse effects. Unfortunately, during a recent MS flare she was "stuck in bed" and bored, and started smoking again. She hates it and wants to be tobacco free.  Overall, she's feeling pretty well, improving from her recent MS flare. Wanting to be independent, and doing as much as she can on her own. Good support from her daughter.  Review of Systems Review of Systems     Patient Active Problem List   Diagnosis Date Noted  . Gait difficulty 08/10/2014  . Multiple sclerosis 10/25/2013  . Insomnia 03/23/2013  . Fibroids 12/29/2012  . Elevated cholesterol   . Hypertension   . Arthritis   . Cervical dysplasia      Prior to Admission medications   Medication Sig Start Date End Date Taking? Authorizing Provider  Acetylcarnitine HCl (ACETYL L-CARNITINE) 250 MG CAPS Take by mouth.   Yes Historical Provider, MD  atorvastatin (LIPITOR) 20 MG tablet take 1 tablet by mouth once daily 05/28/14  Yes Alonza Knisley S Rion Catala, PA-C  baclofen (LIORESAL) 10 MG tablet  09/12/14  Yes Historical Provider, MD  Cholecalciferol (D3 ADULT PO) Take by mouth.   Yes Historical Provider, MD  clonazePAM (KLONOPIN) 1 MG tablet Take 1 tablet (1 mg total) by mouth 2 (two) times daily as needed for anxiety. 11/28/14  Yes Marcial Pacas, MD  Cyanocobalamin (B-12) 100 MCG TABS Take by mouth.   Yes Historical Provider, MD  estradiol (ESTRACE) 0.5 MG tablet take 1 tablet by mouth once daily 01/19/14  Yes Huel Cote, NP  Flaxseed, Linseed, (GROUND FLAX SEEDS) POWD Take by mouth.   Yes Historical Provider, MD  glucosamine-chondroitin 500-400 MG tablet Take 1 tablet by mouth 2 (two) times daily with a meal.   Yes Historical Provider,  MD  hydrochlorothiazide (HYDRODIURIL) 25 MG tablet take 1 tablet by mouth once daily 05/28/14  Yes Sharda Keddy S Keiyon Plack, PA-C  ibuprofen (ADVIL,MOTRIN) 200 MG tablet Take 800 mg by mouth 2 (two) times daily.   Yes Historical Provider, MD  medroxyPROGESTERone (PROVERA) 2.5 MG tablet take 1 tablet by mouth once daily 01/19/14  Yes Huel Cote, NP  meloxicam (MOBIC) 15 MG tablet Take 1 tablet (15 mg total) by mouth daily. 11/28/13  Yes Marcial Pacas, MD  Omega-3 Fatty Acids (FISH OIL) 300 MG CAPS Take by mouth.   Yes Historical Provider, MD  traZODone (DESYREL) 50 MG tablet Take 1/2 to 1 tablet at bedtime as needed for sleep. 05/23/14  Yes Gay Filler Copland, MD  vitamin E 100 UNIT capsule Take by mouth daily.   Yes Historical Provider, MD  modafinil (PROVIGIL) 100 MG tablet Take 1 tablet (100 mg total) by mouth 2 (two) times daily. 08/22/14   Marcial Pacas, MD  varenicline (CHANTIX) 1 MG tablet Take 1 tablet (1 mg total) by mouth 2 (two) times daily. 12/05/14   Avana Kreiser S Sheridan Hew, PA-C     No Known Allergies     Objective:  Physical Exam  Physical Exam  Constitutional: She is oriented to person, place, and time. She appears well-developed and well-nourished. She is active and cooperative. No distress.  BP 100/60 mmHg  Pulse 94  Temp(Src) 97.8 F (36.6 C) (  Oral)  Resp 16  Ht 5\' 1"  (1.549 m)  Wt 124 lb (56.246 kg)  BMI 23.44 kg/m2  SpO2 98%   Eyes: Conjunctivae are normal.  Neck: Neck supple.  Cardiovascular: Regular rhythm and normal heart sounds.   Pulmonary/Chest: Effort normal and breath sounds normal.  Neurological: She is alert and oriented to person, place, and time.  Skin: Skin is warm and dry.  Psychiatric: She has a normal mood and affect. Her speech is normal and behavior is normal.           Assessment & Plan:  1. Tobacco abuse Restart Chantix. Set quit date at least 7 days in the future. Begin with 1/2 tablet once each day and incease every 4 days up to 1 tablet BID. -  varenicline (CHANTIX) 1 MG tablet; Take 1 tablet (1 mg total) by mouth 2 (two) times daily.  Dispense: 180 tablet; Refill: 1  2. Screening for HIV (human immunodeficiency virus) She'll ask to have this added at her next blood draw. - HIV antibody; Future   Fara Chute, PA-C Physician Assistant-Certified [facility]

## 2014-12-09 ENCOUNTER — Other Ambulatory Visit: Payer: Self-pay | Admitting: Neurology

## 2014-12-11 ENCOUNTER — Ambulatory Visit (INDEPENDENT_AMBULATORY_CARE_PROVIDER_SITE_OTHER): Payer: Self-pay | Admitting: Neurology

## 2014-12-11 DIAGNOSIS — G35 Multiple sclerosis: Secondary | ICD-10-CM

## 2014-12-11 DIAGNOSIS — R269 Unspecified abnormalities of gait and mobility: Secondary | ICD-10-CM

## 2014-12-11 NOTE — Progress Notes (Signed)
PATIENT: Wanda [name] DOB: January 27, 1951  HISTORICAL   INITIAL VISIT In FEB 2015  ALINE WESCHE is accompanied by her daughter, [name], she was initially evaluated by Dr Rexene Alberts in December 2nd 2014, referred by her primary care Dr.Jeffrey  Ms. Michl is a 64 years old right-handed African American female, with past medical history of hypertension, hyperlipidemia, longtime smoker  She presents with acute onset of slurred speech, vertigo, since November 7th 2014, also has gait difficulty,  CAT scan of the brain without contrast in November 2014 showed moderate for age nonspecific cerebral white matter changes  MRI showed multiple scattered , discrete and confluent lesions at periventricular, periatrial, peri-callosal brainstem, cranial medullary junction and upper cervical spine white matter hyperintensities on T2/flair.  Enhancing lesions in the left paramedian pons. Left posterior frontal and left parietal and tiny right frontal periventricular white matter which likely represent active demarcating plaques. multiple T1 black holes noted in the periventricular and subcortical white matter. There is mild general cerebral atrophy.  MRI scan of the cervical spine showed spinal cord hyperintensities at C2 and C5 likely represent demyelinating plaques. Contrast images show actively enhancing lesion at C2.  Laboratory evaluation showed normal or negative NMO antibody, A1c 5. 9, ANA, CPK, M09, folic acid, vitamin B1, ESR, TSH, RPR, CMP, , CBC,  CSF study show oligoclonal banding four,, normal meyelin basic protein. ACE was normal 8,   WBC 0, RBC 6, glucose 59, total protein 43,   JC virus titer was positive, 0.66, repeat titer 0.60 in 08/10/2014  She moved to [place] around 2010, In 2001, she had one episode of sudden right visual loss, reported extensive evaluation at that time, was diagnosed with right "eye problem"have received the by mouth steroid, intraocular steroid injection, per  patient MRI of the brain was done at that time too.  Her right vision never came back to normal, it was blurry, she can only read large print with the right eye  Before this episode, she was highly functional, she retired as a Software engineer, lives independently, no gait difficulty, exercise regularly, driving,   After discuss with patient, and her daughter, she has active form of RRMS, we decided to proceed with Dwyane Dee IV infusion started since January 29 2014,  she understands the potential risks, including PML infection, but with her low JC virus  titer, she has a less than 0. 10/998 chance of getting PML in the first 24 months, we are monitoring her JC virus antibody titer every 3 months, repeating MRI of brain every to 6 months.  She also agrees to enroll in baclofen extended release trial, because she continues to have bilateral upper and lower extremity muscle spasticity.  UPDATE Oct 2nd 2015:  She was enrolled in [employer] baclofen extended release study since May 24 2014,, tolerating the medication well, on last visit in September 24th 2015, her baclofen dosage was increased from 20 mg, to 30 milligrams daily, she tolerated the medication very well, reported continued improvement, less  bilaterally hands muscle spasm, less bilateral lower extremity muscle spasm, she is able to squeeze them and again, she fell a few days ago, has right lateral thigh area proves, achy pain,  UPDATE Nov 6th 2015: A week before her most recent  Tysarbri infusion in October seventh 2015, she felt right arm, and leg weakness, increased gait difficulty, fatigue, getting worse since October seventh 2015, which has been persistent, she had quit driving about 2 weeks ago,  no confident in her right leg controlling the gas paddles.  We have reviewed repeat MRI of the brain together,there was evidence of Moderate-severe periventricular and subcortical and pontine and upper cervical cord chronic demyelinating  disease. Mild diffuse and severe corpus callosum atrophy.  No abnormal lesions are seen on post contrast views. Compared to MRI on 09/21/13 and 10/11/13, there is a new chronic plaque (61m) in the left basal ganglia (series 17, image 13). Also prior acute plaques no longer enhance.  This new lesions involving left basal ganglion, likely explaining for her acute worsening of right side weakness, could indicating a MS flareup,  UPDATE Nov 18th 2015:  She is here for some formal follow-up according to protocol, currently taking baclofen ER 40 mg every day without significant side effect, she was treated with Metro pack since last visit in August 10 2014, reported improvement of her right side weakness, but still not back to her baseline, could not drive, because of lack of control of right ankle movement  She also complains of excessive fatigue,  Anti-Tysarbri Antibody negative in Nov 2015  UPDATE Feb 24th 2016: She started taking provigil 100 mg twice a day, which has been helpful, she is tolerating baclofen ER 40 mg every day, which does help her right upper and lower extremity spasticity, she continue have gait difficulty, no longer driving, low titer JC virus positive antibody, 0.6, negative Tysarbri antibody, she did has 1 flareups of right-sided weakness in October 2015, 6 months after taking Tysarbri April 2015,  She continue complains of right upper lower extremity muscle spasticity, will increase her baclofen ER to 50 mg daily  UPDATE March 8th 2016: Higher dose of baclofen ER 50 mg every night, works better, she has less right arm and right leg spasticity, clonazepam 1 mg as needed works well for her insomnia, she hopes to be on higher dose of baclofen, for better control of her right side spasticity.  Review of system: system review of systems performed and notable only for  gait difficulty, right side weakness  ALLERGIES: No Known Allergies  HOME MEDICATIONS: Outpatient  Prescriptions Prior to Visit  Medication Sig Dispense Refill  . atorvastatin (LIPITOR) 20 MG tablet Take 1 tablet (20 mg total) by mouth daily.  90 tablet  3  . estradiol (ESTRACE) 0.5 MG tablet Take 1 tablet (0.5 mg total) by mouth daily.  90 tablet  3  . glucosamine-chondroitin 500-400 MG tablet Take 1 tablet by mouth 2 (two) times daily with a meal.      . hydrochlorothiazide (HYDRODIURIL) 25 MG tablet take 1 tablet by mouth once daily  90 tablet  1  . medroxyPROGESTERone (PROVERA) 2.5 MG tablet Take 1 tablet (2.5 mg total) by mouth daily.  90 tablet  4  . meloxicam (MOBIC) 15 MG tablet take 1 tablet by mouth once daily  90 tablet  1  . traZODone (DESYREL) 50 MG tablet TAKE 1/2 TO 1 TABLET AT BEDTIME AS NEEDED FOR SLEEP.  30 tablet  2  . varenicline (CHANTIX) 1 MG tablet Take 1 tablet (1 mg total) by mouth 2 (two) times daily.  60 tablet  5  . zolpidem (AMBIEN) 10 MG tablet take 1 tablet by mouth at bedtime if needed  30 tablet  0     PAST MEDICAL HISTORY: Past Medical History  Diagnosis Date  . Elevated cholesterol   . Hypertension   . Arthritis   . Cervical dysplasia   . Depression   . Anxiety   .  Macular degeneration of right eye 2001    PAST SURGICAL HISTORY: Past Surgical History  Procedure Laterality Date  . Colposcopy    . Knee surgery    . Cholecystectomy    . Tubal ligation      FAMILY HISTORY: Family History  Problem Relation Age of Onset  . Cancer Mother     Colon  . Diabetes Mother   . Hypertension Mother   . Arthritis Mother   . Cancer Father     prostate  . Kidney disease Brother     congenital single kidney  . Arthritis Sister   . Arthritis Sister   . Hematuria Son   . Gout Brother   . Multiple sclerosis Brother   . Arthritis Brother   . HIV Brother   . Cancer Brother     spinal    SOCIAL HISTORY:  History   Social History  . Marital Status: Widowed    Spouse Name: N/A    Number of Children: 2  . Years of Education: college    Occupational History  . retired     Social History Main Topics  . Smoking status: Current Some Day Smoker -- 1.00 packs/day for 45 years    Types: Cigarettes    Last Attempt to Quit: 01/21/2013  . Smokeless tobacco: Never Used  . Alcohol Use: 3.6 oz/week    6 Cans of beer per week  . Drug Use: No  . Sexual Activity: No   Other Topics Concern  . Not on file   Social History Narrative   Grew up in [place] area, 1 of 10 siblings - 44 still living, married for 33 years then divorced, moved to [place] to be near daughter post retirement, als has 1 son. 2 dogs.       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     PHYSICAL EXAM   PHYSICAL EXAMNIATION:  Gen: NAD, conversant, well nourised, obese, well groomed                     Cardiovascular: Regular rate rhythm, no peripheral edema, warm, nontender. Eyes: Conjunctivae clear without exudates or hemorrhage Neck: Supple, no carotid bruise. Pulmonary: Clear to auscultation bilaterally   NEUROLOGICAL EXAM:  MENTAL STATUS: Speech:    Speech is normal; fluent and spontaneous with normal comprehension.  Cognition:    The patient is oriented to person, place, and time;     recent and remote memory intact;     language fluent;     normal attention, concentration,     fund of knowledge.  CRANIAL NERVES: CN II: Visual fields are full to confrontation. Fundoscopic exam is normal with sharp discs and no vascular changes. Venous pulsations are present bilaterally. Pupils are 4 mm and briskly reactive to light. Visual acuity is 20/20 bilaterally. CN III, IV, VI: extraocular movement are normal. No ptosis. CN V: Facial sensation is intact to pinprick in all 3 divisions bilaterally. Corneal responses are intact.  CN VII: Face is symmetric with normal eye closure and smile. CN VIII: Hearing is normal to rubbing fingers CN IX, X: Palate elevates symmetrically. Phonation is normal. CN XI: Head turning and shoulder  shrug are intact CN XII: Tongue is midline with normal movements and no atrophy.  MOTOR: There is no pronator drift of out-stretched arms. Muscle bulk and tone are normal. Muscle strength is normal.   Shoulder abduction Shoulder external rotation Elbow flexion Elbow extension  Wrist flexion Wrist extension Finger abduction Hip flexion Knee flexion Knee extension Ankle dorsi flexion Ankle plantar flexion  R 4 4 4- 4 4 4 4 3 4 4  4- 4+  L 5 5 5 5 5 5 5 5 5 5 5 5     REFLEXES: Reflexes are 2+ and symmetric at the biceps, triceps, knees, and ankles. Plantar responses are flexor.  SENSORY: Light touch, pinprick, position sense, and vibration sense are intact in fingers and toes.  COORDINATION: Rapid alternating movements and fine finger movements are intact. There is no dysmetria on finger-to-nose and heel-knee-shin. There are no abnormal or extraneous movements.   GAIT/STANCE: Unsteady, right hemi-circumferential gait, dragging her right leg, Romberg is absent.   DIAGNOSTIC DATA (LABS, IMAGING, TESTING) - I reviewed patient records, labs, notes, testing and imaging myself where available.  Lab Results  Component Value Date   WBC 6.9 08/10/2014   HGB 12.9 08/10/2014   HCT 37.2 08/10/2014   MCV 85 08/10/2014   PLT 182 08/10/2014      Component Value Date/Time   NA 136 08/10/2014 1304   NA 141 06/29/2013 1501   K 3.6 08/10/2014 1304   CL 99 08/10/2014 1304   CO2 21 08/10/2014 1304   GLUCOSE 90 08/10/2014 1304   GLUCOSE 85 06/29/2013 1501   BUN 15 08/10/2014 1304   BUN 11 06/29/2013 1501   CREATININE 1.00 08/10/2014 1304   CREATININE 0.89 06/29/2013 1501   CALCIUM 9.2 08/10/2014 1304   PROT 7.2 08/10/2014 1304   PROT 8.3 06/29/2013 1501   ALBUMIN 4.7 06/29/2013 1501   AST 16 08/10/2014 1304   ALT 17 08/10/2014 1304   ALKPHOS 73 08/10/2014 1304   BILITOT 0.6 08/10/2014 1304   GFRNONAA 60 08/10/2014 1304   GFRAA 69 08/10/2014 1304   Lab Results  Component Value Date    CHOL 168 06/29/2013   HDL 70 06/29/2013   LDLCALC 85 06/29/2013   TRIG 63 06/29/2013   CHOLHDL 2.4 06/29/2013   Lab Results  Component Value Date   HGBA1C 5.9* 09/05/2013   Lab Results  Component Value Date   QZESPQZR00 762 09/05/2013   Lab Results  Component Value Date   TSH 0.463 09/05/2013    ASSESSMENT AND PLAN   64 years old [place] American female, with  relapsing meeting multiple sclerosis,Presented with previous optic neuritis, gait difficulty since 2014, worsening right-sided weakness since September 15, while taking Tysarbri IV infusion,  Repeat MRI of the brain showed new lesion at left basal ganglia/internal capsule region, in the setting of extensive lesions involving brain, and cervical.  She has been treated with Dwyane Dee infusion since January 29 2014, tolerating medication well,She is JC virus positive, with titer of 0.66 in February 2015, 0.10 August 2014,   She was enrolled in [employer] Baclofen ER 11-4, since August 20th 2015, now taking 54m since Oct 2nd 2015, she reported symptomatic improvement with higher dosage, no significant side effects,  Increase baclofen ER to 60 mg daily  Keep provigil 100 mg twice a day Clonazepam 1 mg as needed for insomnia,  YMarcial Pacas M.D. Ph.D.  [facility] [address] [place] [phone]

## 2014-12-12 NOTE — Progress Notes (Signed)
Rayne Du (DOB: [id number]) was seen today for an unscheduled visit for the [employer] [id number] Research Trial: A Clinical Evaluation Of The Safety of Baclofen ER Capsules (GRS) When Administered Once Daily To Subjects With Spasticity Due To Multiple Sclerosis (MS): An Open Label, Long Term, Safety Trial. The patient was accompanied by her daughter.  Since the last visit, patient stated that there were not new changes in her medications or any adverse events. Patient returned medicine (91 pills) and was found to be compliant. Since upper left arm stiffness continues, a dose change was required (from 50 mg to 60 mg). New medicine was dispensed.  Subject Diary and dosing instructions were reviewed with the patient. Patient was reminded to minimize alcohol consumption if applicable and take the study medication at the same time of day 30 minutes after the evening meal with a full glass of water. The patient was reminded of the 3 month follow up visit scheduled for [id number].

## 2014-12-12 NOTE — Addendum Note (Signed)
Addended by: Shea Evans, Darrick Meigs D on: 12/12/2014 11:26 AM   Modules accepted: Level of Service

## 2014-12-13 NOTE — Progress Notes (Signed)
I have reviewed and agreed above plan. 

## 2014-12-25 ENCOUNTER — Encounter (HOSPITAL_COMMUNITY)
Admission: RE | Admit: 2014-12-25 | Discharge: 2014-12-25 | Disposition: A | Discharge status: Home / Self Care | Payer: Federal, State, Local not specified - PPO | Source: Ambulatory Visit | Attending: Neurology | Admitting: Neurology

## 2014-12-25 VITALS — BP 119/72 | HR 82 | Temp 98.3°F | Resp 18 | Ht 61.0 in | Wt 124.0 lb

## 2014-12-25 DIAGNOSIS — G35 Multiple sclerosis: Secondary | ICD-10-CM | POA: Diagnosis present

## 2014-12-25 MED ORDER — SODIUM CHLORIDE 0.9 % IV SOLN
INTRAVENOUS | Status: DC
  Administered 2014-12-25: 250 mL via INTRAVENOUS

## 2014-12-25 MED ORDER — SODIUM CHLORIDE 0.9 % IV SOLN
300.0000 mg | INTRAVENOUS | Status: DC
  Administered 2014-12-25: 300 mg via INTRAVENOUS
  Filled 2014-12-25: qty 15

## 2014-12-25 MED ORDER — LORATADINE 10 MG PO TABS
10.0000 mg | ORAL_TABLET | ORAL | Status: DC
  Administered 2014-12-25: 10 mg via ORAL
  Filled 2014-12-25: qty 1

## 2014-12-25 MED ORDER — ACETAMINOPHEN 500 MG PO TABS
1000.0000 mg | ORAL_TABLET | ORAL | Status: DC
  Administered 2014-12-25: 1000 mg via ORAL
  Filled 2014-12-25: qty 2

## 2014-12-25 NOTE — Progress Notes (Signed)
Uneventful infusion of Tysabri. Pt stayed only 15 minutes of the recommended post infusion observation time She is to call her dr for any questions or concerns. Pt is to receive her future infusions at the [place] [facility] and to expect a call from them to schedule her next appointment. Pt and daughter were given this information and verbalized understanding

## 2015-01-10 ENCOUNTER — Telehealth: Payer: Self-pay | Admitting: Neurology

## 2015-01-10 NOTE — Telephone Encounter (Signed)
Patient was told at the previous infusion visit that the next infusion would be given at the [facility] office. Patient is calling to find out more information. I told her that I would pass the message to Noberto Retort, RN (Dr. Rhea Belton nurse).

## 2015-01-10 NOTE — Telephone Encounter (Signed)
I left a message for the patient to return my call.

## 2015-01-11 NOTE — Telephone Encounter (Signed)
She has an appointment on 01/14/15.

## 2015-01-22 ENCOUNTER — Encounter (HOSPITAL_COMMUNITY)
Admission: RE | Admit: 2015-01-22 | Payer: Federal, State, Local not specified - PPO | Source: Ambulatory Visit | Attending: Neurology | Admitting: Neurology

## 2015-02-19 ENCOUNTER — Encounter (HOSPITAL_COMMUNITY): Payer: Federal, State, Local not specified - PPO

## 2015-02-26 ENCOUNTER — Telehealth: Payer: Self-pay | Admitting: Neurology

## 2015-02-26 ENCOUNTER — Encounter (INDEPENDENT_AMBULATORY_CARE_PROVIDER_SITE_OTHER): Payer: Self-pay

## 2015-02-26 DIAGNOSIS — Z0289 Encounter for other administrative examinations: Secondary | ICD-10-CM

## 2015-02-26 DIAGNOSIS — G35 Multiple sclerosis: Secondary | ICD-10-CM

## 2015-02-26 MED ORDER — MODAFINIL 200 MG PO TABS: 200.0000 mg | ORAL_TABLET | Freq: Every day | ORAL | Status: DC

## 2015-02-26 NOTE — Telephone Encounter (Signed)
She has some questions about her bills, she is getting her Tysarbri infusion at our office now, she is also enrolled in [place] program.  Change to Provigil 200mg  qday from 100mg  bid.  Refer her to PT.

## 2015-03-03 ENCOUNTER — Other Ambulatory Visit: Payer: Self-pay | Admitting: Neurology

## 2015-03-05 ENCOUNTER — Telehealth: Payer: Self-pay | Admitting: Neurology

## 2015-03-05 NOTE — Telephone Encounter (Signed)
I called the pharmacy who said they just need a refill on this med.  The request was already approved by Provider today.

## 2015-03-05 NOTE — Telephone Encounter (Signed)
Faxed to requested pharmacy [facility] [phone].

## 2015-03-05 NOTE — Telephone Encounter (Signed)
Patient called and stated that she went to have her Rx. modafinil (PROVIGIL) 200 MG tablet and the pharmacy told her they needed a authorization from Dr. Krista Blue. Please call and advise.   [place], [place]

## 2015-03-05 NOTE — Telephone Encounter (Signed)
Called patient to let her know Wanda [name] will work on this for her.

## 2015-03-12 ENCOUNTER — Emergency Department (HOSPITAL_COMMUNITY): Payer: Federal, State, Local not specified - PPO

## 2015-03-12 ENCOUNTER — Telehealth: Payer: Self-pay | Admitting: Neurology

## 2015-03-12 ENCOUNTER — Inpatient Hospital Stay (HOSPITAL_COMMUNITY): Payer: Federal, State, Local not specified - PPO

## 2015-03-12 ENCOUNTER — Inpatient Hospital Stay (HOSPITAL_COMMUNITY)
Admission: EM | Admit: 2015-03-12 | Discharge: 2015-03-13 | DRG: 640 | Disposition: A | Discharge status: Home / Self Care | Payer: Federal, State, Local not specified - PPO | Attending: Family Medicine | Admitting: Family Medicine

## 2015-03-12 ENCOUNTER — Encounter (HOSPITAL_COMMUNITY): Payer: Self-pay

## 2015-03-12 ENCOUNTER — Ambulatory Visit: Payer: Federal, State, Local not specified - PPO | Admitting: Neurology

## 2015-03-12 DIAGNOSIS — R680 Hypothermia, not associated with low environmental temperature: Secondary | ICD-10-CM | POA: Diagnosis present

## 2015-03-12 DIAGNOSIS — R404 Transient alteration of awareness: Secondary | ICD-10-CM

## 2015-03-12 DIAGNOSIS — E860 Dehydration: Secondary | ICD-10-CM | POA: Diagnosis present

## 2015-03-12 DIAGNOSIS — G9340 Encephalopathy, unspecified: Secondary | ICD-10-CM | POA: Diagnosis present

## 2015-03-12 DIAGNOSIS — Z79899 Other long term (current) drug therapy: Secondary | ICD-10-CM | POA: Diagnosis not present

## 2015-03-12 DIAGNOSIS — H3530 Unspecified macular degeneration: Secondary | ICD-10-CM | POA: Diagnosis present

## 2015-03-12 DIAGNOSIS — F17210 Nicotine dependence, cigarettes, uncomplicated: Secondary | ICD-10-CM | POA: Diagnosis present

## 2015-03-12 DIAGNOSIS — G35 Multiple sclerosis: Secondary | ICD-10-CM | POA: Diagnosis present

## 2015-03-12 DIAGNOSIS — I10 Essential (primary) hypertension: Secondary | ICD-10-CM | POA: Diagnosis present

## 2015-03-12 DIAGNOSIS — E872 Acidosis: Secondary | ICD-10-CM | POA: Diagnosis present

## 2015-03-12 DIAGNOSIS — G35D Multiple sclerosis, unspecified: Secondary | ICD-10-CM

## 2015-03-12 DIAGNOSIS — E785 Hyperlipidemia, unspecified: Secondary | ICD-10-CM | POA: Diagnosis present

## 2015-03-12 DIAGNOSIS — Z820 Family history of epilepsy and other diseases of the nervous system: Secondary | ICD-10-CM

## 2015-03-12 DIAGNOSIS — F329 Major depressive disorder, single episode, unspecified: Secondary | ICD-10-CM | POA: Diagnosis present

## 2015-03-12 DIAGNOSIS — F419 Anxiety disorder, unspecified: Secondary | ICD-10-CM | POA: Diagnosis present

## 2015-03-12 DIAGNOSIS — E876 Hypokalemia: Secondary | ICD-10-CM | POA: Diagnosis present

## 2015-03-12 DIAGNOSIS — R4182 Altered mental status, unspecified: Secondary | ICD-10-CM

## 2015-03-12 DIAGNOSIS — R195 Other fecal abnormalities: Secondary | ICD-10-CM

## 2015-03-12 HISTORY — DX: Encephalopathy, unspecified: G93.40

## 2015-03-12 LAB — CBC WITH DIFFERENTIAL/PLATELET
Basophils Absolute: 0 10*3/uL (ref 0.0–0.1)
Basophils Relative: 0 % (ref 0–1)
EOS PCT: 4 % (ref 0–5)
Eosinophils Absolute: 0.3 10*3/uL (ref 0.0–0.7)
HCT: 35.7 % — ABNORMAL LOW (ref 36.0–46.0)
Hemoglobin: 12.6 g/dL (ref 12.0–15.0)
LYMPHS ABS: 2.6 10*3/uL (ref 0.7–4.0)
Lymphocytes Relative: 38 % (ref 12–46)
MCH: 29.1 pg (ref 26.0–34.0)
MCHC: 35.3 g/dL (ref 30.0–36.0)
MCV: 82.4 fL (ref 78.0–100.0)
Monocytes Absolute: 0.6 10*3/uL (ref 0.1–1.0)
Monocytes Relative: 8 % (ref 3–12)
Neutro Abs: 3.4 10*3/uL (ref 1.7–7.7)
Neutrophils Relative %: 50 % (ref 43–77)
PLATELETS: 137 10*3/uL — AB (ref 150–400)
RBC: 4.33 MIL/uL (ref 3.87–5.11)
RDW: 13.9 % (ref 11.5–15.5)
WBC: 6.9 10*3/uL (ref 4.0–10.5)

## 2015-03-12 LAB — I-STAT CG4 LACTIC ACID, ED
Lactic Acid, Venous: 1.56 mmol/L (ref 0.5–2.0)
Lactic Acid, Venous: 1.86 mmol/L (ref 0.5–2.0)

## 2015-03-12 LAB — URINALYSIS, ROUTINE W REFLEX MICROSCOPIC
Bilirubin Urine: NEGATIVE
Glucose, UA: NEGATIVE mg/dL
Hgb urine dipstick: NEGATIVE
Ketones, ur: NEGATIVE mg/dL
Leukocytes, UA: NEGATIVE
Nitrite: NEGATIVE
Protein, ur: NEGATIVE mg/dL
Specific Gravity, Urine: 1.009 (ref 1.005–1.030)
Urobilinogen, UA: 0.2 mg/dL (ref 0.0–1.0)
pH: 5 (ref 5.0–8.0)

## 2015-03-12 LAB — VITAMIN B12: Vitamin B-12: >7500 pg/mL — ABNORMAL HIGH (ref 180–914)

## 2015-03-12 LAB — MRSA PCR SCREENING: MRSA by PCR: NEGATIVE

## 2015-03-12 LAB — COMPREHENSIVE METABOLIC PANEL
ALBUMIN: 3.2 g/dL — AB (ref 3.5–5.0)
ALT: 28 U/L (ref 14–54)
ANION GAP: 11 (ref 5–15)
AST: 29 U/L (ref 15–41)
Alkaline Phosphatase: 59 U/L (ref 38–126)
BUN: 42 mg/dL — AB (ref 6–20)
CO2: 21 mmol/L — AB (ref 22–32)
CREATININE: 1.43 mg/dL — AB (ref 0.44–1.00)
Calcium: 8 mg/dL — ABNORMAL LOW (ref 8.9–10.3)
Chloride: 106 mmol/L (ref 101–111)
GFR calc Af Amer: 44 mL/min — ABNORMAL LOW (ref >60)
GFR calc non Af Amer: 38 mL/min — ABNORMAL LOW (ref >60)
Glucose, Bld: 111 mg/dL — ABNORMAL HIGH (ref 65–99)
Potassium: 2.9 mmol/L — ABNORMAL LOW (ref 3.5–5.1)
Sodium: 138 mmol/L (ref 135–145)
Total Bilirubin: 0.3 mg/dL (ref 0.3–1.2)
Total Protein: 6.1 g/dL — ABNORMAL LOW (ref 6.5–8.1)

## 2015-03-12 LAB — BLOOD GAS, ARTERIAL
ACID-BASE DEFICIT: 4.6 mmol/L — AB (ref 0.0–2.0)
Bicarbonate: 20 mEq/L (ref 20.0–24.0)
Drawn by: 31297
FIO2: 0.21 %
O2 Saturation: 94.8 %
PATIENT TEMPERATURE: 98.6
PCO2 ART: 37.2 mmHg (ref 35.0–45.0)
PH ART: 7.35 (ref 7.350–7.450)
TCO2: 21.2 mmol/L (ref 0–100)
pO2, Arterial: 79.8 mmHg — ABNORMAL LOW (ref 80.0–100.0)

## 2015-03-12 LAB — MAGNESIUM
Magnesium: 2.8 mg/dL — ABNORMAL HIGH (ref 1.7–2.4)
Magnesium: 2.4 mg/dL (ref 1.7–2.4)

## 2015-03-12 LAB — I-STAT ARTERIAL BLOOD GAS, ED
Acid-base deficit: 6 mmol/L — ABNORMAL HIGH (ref 0.0–2.0)
BICARBONATE: 20.8 meq/L (ref 20.0–24.0)
O2 SAT: 93 %
TCO2: 22 mmol/L (ref 0–100)
pCO2 arterial: 44.1 mmHg (ref 35.0–45.0)
pH, Arterial: 7.282 — ABNORMAL LOW (ref 7.350–7.450)
pO2, Arterial: 75 mmHg — ABNORMAL LOW (ref 80.0–100.0)

## 2015-03-12 LAB — TSH: TSH: 0.198 u[IU]/mL — ABNORMAL LOW (ref 0.350–4.500)

## 2015-03-12 LAB — CK
Total CK: 395 U/L — ABNORMAL HIGH (ref 38–234)
Total CK: 450 U/L — ABNORMAL HIGH (ref 38–234)

## 2015-03-12 LAB — RAPID URINE DRUG SCREEN, HOSP PERFORMED
Amphetamines: NOT DETECTED
Barbiturates: NOT DETECTED
Benzodiazepines: NOT DETECTED
Cocaine: NOT DETECTED
Opiates: NOT DETECTED
Tetrahydrocannabinol: NOT DETECTED

## 2015-03-12 LAB — I-STAT TROPONIN, ED: Troponin i, poc: 0 ng/mL (ref 0.00–0.08)

## 2015-03-12 LAB — PROTIME-INR
INR: 1.07 (ref 0.00–1.49)
Prothrombin Time: 14.1 seconds (ref 11.6–15.2)

## 2015-03-12 LAB — POC OCCULT BLOOD, ED: FECAL OCCULT BLD: POSITIVE — AB

## 2015-03-12 LAB — APTT: aPTT: 26 seconds (ref 24–37)

## 2015-03-12 LAB — PHOSPHORUS: Phosphorus: 3.3 mg/dL (ref 2.5–4.6)

## 2015-03-12 LAB — LIPID PANEL
Cholesterol: 99 mg/dL (ref 0–200)
HDL: 45 mg/dL (ref >40)
LDL CALC: 46 mg/dL (ref 0–99)
Total CHOL/HDL Ratio: 2.2 RATIO
Triglycerides: 38 mg/dL (ref <150)
VLDL: 8 mg/dL (ref 0–40)

## 2015-03-12 LAB — ETHANOL

## 2015-03-12 LAB — CBG MONITORING, ED: Glucose-Capillary: 122 mg/dL — ABNORMAL HIGH (ref 65–99)

## 2015-03-12 LAB — TECHNOLOGIST SMEAR REVIEW

## 2015-03-12 LAB — FOLATE: Folate: 12.5 ng/mL (ref >5.9)

## 2015-03-12 MED ORDER — POTASSIUM CHLORIDE 10 MEQ/100ML IV SOLN
10.0000 meq | INTRAVENOUS | Status: AC
  Administered 2015-03-12: 10 meq via INTRAVENOUS
  Filled 2015-03-12 (×2): qty 100

## 2015-03-12 MED ORDER — SODIUM CHLORIDE 0.9 % IV BOLUS (SEPSIS)
1000.0000 mL | INTRAVENOUS | Status: AC
  Administered 2015-03-12 (×2): 1000 mL via INTRAVENOUS

## 2015-03-12 MED ORDER — PIPERACILLIN-TAZOBACTAM 3.375 G IVPB 30 MIN
3.3750 g | Freq: Once | INTRAVENOUS | Status: AC
Start: 2015-03-12 — End: 2015-03-12
  Administered 2015-03-12: 3.375 g via INTRAVENOUS
  Filled 2015-03-12: qty 50

## 2015-03-12 MED ORDER — LORAZEPAM 2 MG/ML IJ SOLN: 0.5000 mg | INTRAMUSCULAR | Status: DC | PRN

## 2015-03-12 MED ORDER — LORAZEPAM 2 MG/ML IJ SOLN
0.5000 mg | Freq: Once | INTRAMUSCULAR | Status: AC
  Administered 2015-03-12: 0.5 mg via INTRAVENOUS
  Filled 2015-03-12: qty 1

## 2015-03-12 MED ORDER — TRAZODONE HCL 50 MG PO TABS
25.0000 mg | ORAL_TABLET | Freq: Every evening | ORAL | Status: DC | PRN
  Administered 2015-03-12: 25 mg via ORAL
  Filled 2015-03-12: qty 1

## 2015-03-12 MED ORDER — POTASSIUM CHLORIDE 10 MEQ/100ML IV SOLN
10.0000 meq | INTRAVENOUS | Status: AC
  Administered 2015-03-12 (×2): 10 meq via INTRAVENOUS
  Filled 2015-03-12: qty 100

## 2015-03-12 MED ORDER — SODIUM CHLORIDE 0.9 % IV SOLN: INTRAVENOUS | Status: DC

## 2015-03-12 MED ORDER — HEPARIN SODIUM (PORCINE) 5000 UNIT/ML IJ SOLN
5000.0000 [IU] | Freq: Three times a day (TID) | INTRAMUSCULAR | Status: DC
  Administered 2015-03-12: 5000 [IU] via SUBCUTANEOUS
  Filled 2015-03-12 (×2): qty 1

## 2015-03-12 MED ORDER — VANCOMYCIN HCL 500 MG IV SOLR
500.0000 mg | INTRAVENOUS | Status: DC
  Filled 2015-03-12: qty 500

## 2015-03-12 MED ORDER — BACLOFEN 20 MG PO TABS: 60.0000 mg | ORAL_TABLET | Freq: Every day | ORAL | Status: DC

## 2015-03-12 MED ORDER — PIPERACILLIN-TAZOBACTAM 3.375 G IVPB
3.3750 g | Freq: Three times a day (TID) | INTRAVENOUS | Status: DC
  Administered 2015-03-12 – 2015-03-13 (×3): 3.375 g via INTRAVENOUS
  Filled 2015-03-12 (×7): qty 50

## 2015-03-12 MED ORDER — NALOXONE HCL 0.4 MG/ML IJ SOLN: 0.4000 mg | Freq: Once | INTRAMUSCULAR | Status: DC

## 2015-03-12 MED ORDER — SODIUM CHLORIDE 0.9 % IV SOLN
INTRAVENOUS | Status: AC
  Administered 2015-03-12: 17:00:00 via INTRAVENOUS

## 2015-03-12 MED ORDER — VANCOMYCIN HCL IN DEXTROSE 1-5 GM/200ML-% IV SOLN
1000.0000 mg | Freq: Once | INTRAVENOUS | Status: AC
  Administered 2015-03-12: 1000 mg via INTRAVENOUS
  Filled 2015-03-12: qty 200

## 2015-03-12 NOTE — ED Notes (Signed)
Pt transported to MRI 

## 2015-03-12 NOTE — ED Provider Notes (Signed)
CSN: [id number]     Arrival date & time 03/12/15  0720 History   First MD Initiated Contact with Patient 03/12/15 [phone]     Chief Complaint  Patient presents with  . Altered Mental Status     (Consider location/radiation/quality/duration/timing/severity/associated sxs/prior Treatment) HPI Comments: Level V caveat 2/2 altered LOC.   Patient with h/o MS presents by EMS with altered LOC. Patient has been living with family for the past several days due to intermittent altered level consciousness and weakness. Two days ago patient was altered but this improved that evening. She was also more weak in her legs at times and needed assistance with walking, but this is not unusual with her MS. This improved that evening. Yesterday she was active and not as confused. Patient was assisted to the bathroom this morning and had an apparent syncopal episode where she was unconscious for several minutes. Afterwards she was altered/confused and minimally responsive. No reported seizure activity but EMS reports patient appearance similar to a "postictal" state. Patient was transported and was unchanged for 30 minutes. She will repeat statements over and over. Will not follow simple commands. CBG nml. She was initially hypotensive on-scene but this improved quickly with fluids.   Spoke with family who corborates this history. They add that she has been known to take more of her Provigil, Klonopin and trazodone than prescribed.   The history is provided by medical records and the EMS personnel.    Past Medical History  Diagnosis Date  . Elevated cholesterol   . Hypertension   . Arthritis   . Cervical dysplasia   . Depression   . Anxiety   . Macular degeneration of right eye 2001  . MS (multiple sclerosis) 08/2013   Past Surgical History  Procedure Laterality Date  . Colposcopy    . Knee surgery    . Cholecystectomy    . Tubal ligation    . Cortisone injections      Bil knees   Family History   Problem Relation Age of Onset  . Cancer Mother     Colon  . Diabetes Mother   . Hypertension Mother   . Arthritis Mother   . Cancer Father     prostate  . Kidney disease Brother     congenital single kidney  . Arthritis Sister   . Arthritis Sister   . Hematuria Son   . Gout Brother   . Multiple sclerosis Brother   . Arthritis Brother   . HIV Brother   . Cancer Brother     spinal   History  Substance Use Topics  . Smoking status: Current Every Day Smoker -- 1.00 packs/day for 45 years    Types: Cigarettes    Last Attempt to Quit: 10/23/2013  . Smokeless tobacco: Never Used  . Alcohol Use: No     Comment: hasn't drank anything since jan 2015   OB History    Gravida Para Term Preterm AB TAB SAB Ectopic Multiple Living   6 3 3  3  3   2      Review of Systems  Unable to perform ROS: Mental status change      Allergies  Review of patient's allergies indicates no known allergies.  Home Medications   Prior to Admission medications   Medication Sig Start Date End Date Taking? Authorizing Provider  Acetylcarnitine HCl (ACETYL L-CARNITINE) 250 MG CAPS Take by mouth.    Historical Provider, MD  atorvastatin (LIPITOR) 20 MG tablet take 1  tablet by mouth once daily 05/28/14   Harrison Mons, PA-C  baclofen (LIORESAL) 10 MG tablet  09/12/14   Historical Provider, MD  Cholecalciferol (D3 ADULT PO) Take by mouth.    Historical Provider, MD  clonazePAM (KLONOPIN) 1 MG tablet Take 1 tablet (1 mg total) by mouth 2 (two) times daily as needed for anxiety. 11/28/14   Marcial Pacas, MD  Cyanocobalamin (B-12) 100 MCG TABS Take by mouth.    Historical Provider, MD  estradiol (ESTRACE) 0.5 MG tablet take 1 tablet by mouth once daily 01/19/14   Huel Cote, NP  Flaxseed, Linseed, (GROUND FLAX SEEDS) POWD Take by mouth.    Historical Provider, MD  glucosamine-chondroitin 500-400 MG tablet Take 1 tablet by mouth 2 (two) times daily with a meal.    Historical Provider, MD  hydrochlorothiazide  (HYDRODIURIL) 25 MG tablet take 1 tablet by mouth once daily 05/28/14   [name] Jeffery, PA-C  ibuprofen (ADVIL,MOTRIN) 200 MG tablet Take 800 mg by mouth 2 (two) times daily.    Historical Provider, MD  medroxyPROGESTERone (PROVERA) 2.5 MG tablet take 1 tablet by mouth once daily 01/19/14   Huel Cote, NP  meloxicam (MOBIC) 15 MG tablet take 1 tablet by mouth once daily 12/10/14   Marcial Pacas, MD  modafinil (PROVIGIL) 100 MG tablet take 1 tablet by mouth twice a day 03/05/15   Marcial Pacas, MD  modafinil (PROVIGIL) 200 MG tablet Take 1 tablet (200 mg total) by mouth daily. 02/26/15   Marcial Pacas, MD  Omega-3 Fatty Acids (FISH OIL) 300 MG CAPS Take by mouth.    Historical Provider, MD  traZODone (DESYREL) 50 MG tablet Take 1/2 to 1 tablet at bedtime as needed for sleep. 05/23/14   Darreld Mclean, MD  varenicline (CHANTIX) 1 MG tablet Take 1 tablet (1 mg total) by mouth 2 (two) times daily. 12/05/14   [name] Jeffery, PA-C  vitamin E 100 UNIT capsule Take by mouth daily.    Historical Provider, MD   BP 95/55 mmHg  Pulse 51  Temp(Src) 94.8 F (34.9 C) (Rectal)  Resp 17  SpO2 98%   Physical Exam  Constitutional: She appears well-developed and well-nourished.  HENT:  Head: Normocephalic and atraumatic.  Mouth/Throat: Oropharynx is clear and moist.  Eyes: Conjunctivae are normal. Right eye exhibits no discharge. Left eye exhibits no discharge.  2mm pupils, sluggish  Neck: Normal range of motion. Neck supple.  Cardiovascular: Regular rhythm and normal heart sounds.  Bradycardia present.   No murmur heard. Pulmonary/Chest: Effort normal and breath sounds normal. No respiratory distress. She has no wheezes. She has no rales.  Abdominal: Soft. Bowel sounds are normal. She exhibits no distension. There is no rebound, no guarding and no CVA tenderness.  Genitourinary: Rectal exam shows anal tone abnormal (decreased). Rectal exam shows no external hemorrhoid. Guaiac positive stool.  No gross blood   Musculoskeletal: She exhibits no edema.  Neurological: She exhibits normal muscle tone.  Patient alternates between looking around and following simple commands to sleeping and not being responsive to pain. Unable to perform neuro exam.   Skin: Skin is warm and dry.  Psychiatric: She has a normal mood and affect.  Nursing note and vitals reviewed.   ED Course  Procedures (including critical care time) Labs Review Labs Reviewed  COMPREHENSIVE METABOLIC PANEL - Abnormal; Notable for the following:    Potassium 2.9 (*)    CO2 21 (*)    Glucose, Bld 111 (*)    BUN  42 (*)    Creatinine, Ser 1.43 (*)    Calcium 8.0 (*)    Total Protein 6.1 (*)    Albumin 3.2 (*)    GFR calc non Af Amer 38 (*)    GFR calc Af Amer 44 (*)    All other components within normal limits  CBC WITH DIFFERENTIAL/PLATELET - Abnormal; Notable for the following:    HCT 35.7 (*)    Platelets 137 (*)    All other components within normal limits  MAGNESIUM - Abnormal; Notable for the following:    Magnesium 2.8 (*)    All other components within normal limits  CK - Abnormal; Notable for the following:    Total CK 395 (*)    All other components within normal limits  I-STAT ARTERIAL BLOOD GAS, ED - Abnormal; Notable for the following:    pH, Arterial 7.282 (*)    pO2, Arterial 75.0 (*)    Acid-base deficit 6.0 (*)    All other components within normal limits  CBG MONITORING, ED - Abnormal; Notable for the following:    Glucose-Capillary 122 (*)    All other components within normal limits  POC OCCULT BLOOD, ED - Abnormal; Notable for the following:    Fecal Occult Bld POSITIVE (*)    All other components within normal limits  CULTURE, BLOOD (ROUTINE X 2)  CULTURE, BLOOD (ROUTINE X 2)  URINE CULTURE  URINALYSIS, ROUTINE W REFLEX MICROSCOPIC (NOT AT [facility])  ETHANOL  URINE RAPID DRUG SCREEN (HOSP PERFORMED) NOT AT [facility]  I-STAT CG4 LACTIC ACID, ED  I-STAT TROPOININ, ED  I-STAT CG4 LACTIC ACID, ED     Imaging Review Dg Chest Port 1 View  03/12/2015   CLINICAL DATA:  Altered mental status, history hypertension, multiple sclerosis  EXAM: PORTABLE CHEST - 1 VIEW  COMPARISON:  Portable exam 0743 hours compared to 09/13/2009  FINDINGS: Upper normal heart size.  Tortuous aorta.  Mediastinal contours and pulmonary vascularity normal.  Lungs grossly clear.  No pleural effusion or pneumothorax.  IMPRESSION: No acute abnormalities.   Electronically Signed   By: Lavonia Dana M.D.   On: 03/12/2015 08:17     EKG Interpretation   Date/Time:  Tuesday March 12 2015 07:27:42 EDT Ventricular Rate:  50 PR Interval:  180 QRS Duration: 100 QT Interval:  538 QTC Calculation: 491 R Axis:   54 Text Interpretation:  Sinus rhythm Low voltage, precordial leads  Borderline T abnormalities, anterior leads Borderline ST elevation,  inferior leads Borderline prolonged QT interval Confirmed by Hazle Coca  [phone]) on 03/12/2015 7:42:40 AM       7:38 AM Patient seen and examined. Discussed with and seen by Dr. Ralene Bathe. Work-up initiated. Medications ordered.   Vital signs reviewed and are as follows: BP 95/55 mmHg  Pulse 51  Temp(Src) 94.8 F (34.9 C) (Rectal)  Resp 17  SpO2 98%  8:35 AM Patient re-evaluated several times and is stable. Spoke with family at bedside. UA and CXR clear.   11:44 AM Patient has remained stable. Neuro has seen. Medical admit.   Pt has P[facility] at [facility] -- but [facility] is capped. IMTS up for unassigned and accepts patient.   CRITICAL CARE Performed by: Faustino Congress Total critical care time: 45 Critical care time was exclusive of separately billable procedures and treating other patients. Critical care was necessary to treat or prevent imminent or life-threatening deterioration. Critical care was time spent personally by me on the following activities: development of treatment plan  with patient and/or surrogate as well as nursing, discussions with consultants, evaluation of patient's  response to treatment, examination of patient, obtaining history from patient or surrogate, ordering and performing treatments and interventions, ordering and review of laboratory studies, ordering and review of radiographic studies, pulse oximetry and re-evaluation of patient's condition.   MDM   Final diagnoses:  Altered level of consciousness  Multiple sclerosis  Dehydration  Hypokalemia   Admit.    Carlisle Cater, PA-C 03/12/15 [address], MD 03/13/15 [phone]

## 2015-03-12 NOTE — Consult Note (Signed)
Reason for Consult:Altered mental status Referring Physician: Lysbeth Galas  CC: Altered Mental Status  HPI: Wanda [name] is an 64 y.o. female with a history of MS who is unable to provide history today.  Her daughter reports today that on Sunday she brought her mother home with her because she was confused and was weak in her lower extremities.  Her daughter began to regulate her medications and by Monday she was much better.  In fact, she was able to get up and cook with her daughter. She was constipated though ansd took a laxative which had her going to the bathroom frequently.  They were attempting to keep her hydrated with water as the day progressed.  Today she awakened and her daughter helped her to the bathroom.  She had a syncopal event in the bathroom while on the toilet. She did not come around until she was on the couch and EMS arrived.  Has been agitated since that time.      Past Medical History  Diagnosis Date  . Elevated cholesterol   . Hypertension   . Arthritis   . Cervical dysplasia   . Depression   . Anxiety   . Macular degeneration of right eye 2001  . MS (multiple sclerosis) 08/2013    Past Surgical History  Procedure Laterality Date  . Colposcopy    . Knee surgery    . Cholecystectomy    . Tubal ligation    . Cortisone injections      Bil knees    Family History  Problem Relation Age of Onset  . Cancer Mother     Colon  . Diabetes Mother   . Hypertension Mother   . Arthritis Mother   . Cancer Father     prostate  . Kidney disease Brother     congenital single kidney  . Arthritis Sister   . Arthritis Sister   . Hematuria Son   . Gout Brother   . Multiple sclerosis Brother   . Arthritis Brother   . HIV Brother   . Cancer Brother     spinal    Social History:  reports that she has been smoking Cigarettes.  She has a 45 pack-year smoking history. She has never used smokeless tobacco. She reports that she does not drink alcohol or use illicit  drugs.  No Known Allergies  Medications: I have reviewed the patient's current medications. Prior to Admission:  Current outpatient prescriptions:  .  atorvastatin (LIPITOR) 20 MG tablet, take 1 tablet by mouth once daily, Disp: 30 tablet, Rfl: 5 .  clonazePAM (KLONOPIN) 1 MG tablet, Take 1 tablet (1 mg total) by mouth 2 (two) times daily as needed for anxiety., Disp: 30 tablet, Rfl: 5 .  Cyanocobalamin (B-12 PO), Take 1 tablet by mouth daily., Disp: , Rfl:  .  estradiol (ESTRACE) 0.5 MG tablet, take 1 tablet by mouth once daily, Disp: 90 tablet, Rfl: 4 .  hydrochlorothiazide (HYDRODIURIL) 25 MG tablet, take 1 tablet by mouth once daily, Disp: 30 tablet, Rfl: 5 .  ibuprofen (ADVIL,MOTRIN) 200 MG tablet, Take 1,000 mg by mouth daily as needed for mild pain or moderate pain. , Disp: , Rfl:  .  medroxyPROGESTERone (PROVERA) 2.5 MG tablet, take 1 tablet by mouth once daily, Disp: 90 tablet, Rfl: 4 .  meloxicam (MOBIC) 15 MG tablet, take 1 tablet by mouth once daily, Disp: 90 tablet, Rfl: 1 .  Omega-3 Fatty Acids (FISH OIL PO), Take 1 tablet by mouth daily.,  Disp: , Rfl:  .  traZODone (DESYREL) 50 MG tablet, Take 1/2 to 1 tablet at bedtime as needed for sleep., Disp: 30 tablet, Rfl: 5 .  VITAMIN E PO, Take 1 tablet by mouth daily., Disp: , Rfl:  .  modafinil (PROVIGIL) 100 MG tablet, take 1 tablet by mouth twice a day (Patient not taking: Reported on 03/12/2015), Disp: 60 tablet, Rfl: 5 .  modafinil (PROVIGIL) 200 MG tablet, Take 1 tablet (200 mg total) by mouth daily. (Patient not taking: Reported on 03/12/2015), Disp: 30 tablet, Rfl: 5 .  varenicline (CHANTIX) 1 MG tablet, Take 1 tablet (1 mg total) by mouth 2 (two) times daily. (Patient not taking: Reported on 03/12/2015), Disp: 180 tablet, Rfl: 1  ROS: History obtained from daughter  General ROS: negative for - chills, fatigue, fever, night sweats, weight gain or weight loss Psychological ROS: negative for - behavioral disorder, hallucinations,  memory difficulties, mood swings or suicidal ideation Ophthalmic ROS: negative for - blurry vision, double vision, eye pain or loss of vision ENT ROS: negative for - epistaxis, nasal discharge, oral lesions, sore throat, tinnitus or vertigo Allergy and Immunology ROS: negative for - hives or itchy/watery eyes Hematological and Lymphatic ROS: negative for - bleeding problems, bruising or swollen lymph nodes Endocrine ROS: negative for - galactorrhea, hair pattern changes, polydipsia/polyuria or temperature intolerance Respiratory ROS: negative for - cough, hemoptysis, shortness of breath or wheezing Cardiovascular ROS: negative for - chest pain, dyspnea on exertion, edema or irregular heartbeat Gastrointestinal ROS: negative for - abdominal pain, diarrhea, hematemesis, nausea/vomiting or stool incontinence Genito-Urinary ROS: negative for - dysuria, hematuria, incontinence or urinary frequency/urgency Musculoskeletal ROS: negative for - joint swelling or muscular weakness Neurological ROS: as noted in HPI Dermatological ROS: negative for rash and skin lesion changes  Physical Examination: Blood pressure 88/54, pulse 58, temperature 94.5 F (34.7 C), temperature source Core (Comment), resp. rate 13, weight 56.7 kg (125 lb), SpO2 100 %.  HEENT-  Normocephalic, no lesions, without obvious abnormality.  Normal external eye and conjunctiva.  Normal TM's bilaterally.  Normal auditory canals and external ears. Normal external nose, mucus membranes and septum.  Normal pharynx. Cardiovascular- S1, S2 normal, pulses palpable throughout   Lungs- chest clear, no wheezing, rales, normal symmetric air entry Abdomen- soft, non-tender; bowel sounds normal; no masses,  no organomegaly Extremities- no edema Lymph-no adenopathy palpable Musculoskeletal-no joint tenderness, deformity or swelling Skin-warm and dry, no hyperpigmentation, vitiligo, or suspicious lesions  Neurological Examination Mental  Status: Sleeping soundly when  I enter the room.  Alerted with sternal rub but then fixes on need to use the bathroom and sit up straight.  Knows where she is.  Speech fluent without evidence of aphasia.  Able to follow commands without difficulty. Cranial Nerves: II: Discs flat bilaterally; Visual fields grossly normal, pupils equal, round, reactive to light and accommodation III,IV, VI: ptosis not present, extra-ocular motions intact bilaterally V,VII: smile symmetric, facial light touch sensation normal bilaterally VIII: hearing normal bilaterally IX,X: gag reflex present XI: bilateral shoulder shrug XII: midline tongue extension Motor: Gives full strength with arms but uses arms to lift her legs off the bed.  Does otherwise move her legs across the hospital bed.   Sensory: Pinprick and light touch intact throughout, bilaterally Deep Tendon Reflexes: 2+ and symmetric throughout Plantars: Right: mute   Left: mute Cerebellar: normal finger-to-nose testing Gait: not tested due to agitation   Laboratory Studies:   Basic Metabolic Panel:  Recent Labs Lab 03/12/15 [phone]  NA 138  K 2.9*  CL 106  CO2 21*  GLUCOSE 111*  BUN 42*  CREATININE 1.43*  CALCIUM 8.0*  MG 2.8*    Liver Function Tests:  Recent Labs Lab 03/12/15 0835  AST 29  ALT 28  ALKPHOS 59  BILITOT 0.3  PROT 6.1*  ALBUMIN 3.2*   No results for input(s): LIPASE, AMYLASE in the last 168 hours. No results for input(s): AMMONIA in the last 168 hours.  CBC:  Recent Labs Lab 03/12/15 0835  WBC 6.9  NEUTROABS 3.4  HGB 12.6  HCT 35.7*  MCV 82.4  PLT 137*    Cardiac Enzymes:  Recent Labs Lab 03/12/15 0835  CKTOTAL 395*    BNP: Invalid input(s): POCBNP  CBG:  Recent Labs Lab 03/12/15 0739  GLUCAP 122*    Microbiology: Results for orders placed or performed during the hospital encounter of 10/19/13  Gram stain     Status: None   Collection Time: 10/19/13  7:50 AM  Result Value Ref  Range Status   Gram Stain CYTOSPIN SLIDE  Final   Gram Stain WBC present-predominately Mononuclear  Final   Gram Stain No Organisms Seen  Final  Fungus culture w smear     Status: None   Collection Time: 10/19/13  7:50 AM  Result Value Ref Range Status   Organism ID, Bacteria No Fungi Isolated in 4 Weeks    Final   Smear Result No Yeast or Fungal Elements Seen  Final  Viral culture     Status: None   Collection Time: 10/19/13  7:50 AM  Result Value Ref Range Status   Organism ID, Bacteria No Virus Isolated in Cell Culture  Final   Organism ID, Bacteria   Final                                                                    Comment: The current Enterovirus culture system does not detect Enterovirus D68. If Enterovirus D68 is suspected, please call client services to add the test for Enterovirus PCR (Test code [phone]).   Organism ID, Bacteria   Final      A negative result does not exclude the possibility of   Organism ID, Bacteria   Final    virus infection;inappropriate specimen collection,storage   Organism ID, Bacteria   Final    and transport may lead to false negative culture results.    Coagulation Studies: No results for input(s): LABPROT, INR in the last 72 hours.  Urinalysis:  Recent Labs Lab 03/12/15 0735  COLORURINE YELLOW  LABSPEC 1.009  PHURINE 5.0  GLUCOSEU NEGATIVE  HGBUR NEGATIVE  BILIRUBINUR NEGATIVE  KETONESUR NEGATIVE  PROTEINUR NEGATIVE  UROBILINOGEN 0.2  NITRITE NEGATIVE  LEUKOCYTESUR NEGATIVE    Lipid Panel:     Component Value Date/Time   CHOL 168 06/29/2013 1501   TRIG 63 06/29/2013 1501   HDL 70 06/29/2013 1501   CHOLHDL 2.4 06/29/2013 1501   VLDL 13 06/29/2013 1501   LDLCALC 85 06/29/2013 1501    HgbA1C:  Lab Results  Component Value Date   HGBA1C 5.9* 09/05/2013    Urine Drug Screen:     Component Value Date/Time   LABOPIA NONE DETECTED 03/12/2015 0800   COCAINSCRNUR NONE DETECTED 03/12/2015  0800   LABBENZ NONE DETECTED  03/12/2015 0800   AMPHETMU NONE DETECTED 03/12/2015 0800   THCU NONE DETECTED 03/12/2015 0800   LABBARB NONE DETECTED 03/12/2015 0800    Alcohol Level: No results for input(s): ETH in the last 168 hours.  Other results: EKG: sinus rhythm at 50 bpm.  Imaging: Ct Head Wo Contrast  03/12/2015   CLINICAL DATA:  Syncopal episode  EXAM: CT HEAD WITHOUT CONTRAST  TECHNIQUE: Contiguous axial images were obtained from the base of the skull through the vertex without intravenous contrast.  COMPARISON:  08/25/2013  FINDINGS: Bony calvarium is intact. Mild atrophic changes are noted as well as chronic white matter ischemic change. No findings to suggest acute hemorrhage, acute infarction or space-occupying mass lesion are noted.  IMPRESSION: Chronic atrophic and ischemic changes without acute abnormality.   Electronically Signed   By: Inez Catalina M.D.   On: 03/12/2015 09:08   Dg Chest Port 1 View  03/12/2015   CLINICAL DATA:  Altered mental status, history hypertension, multiple sclerosis  EXAM: PORTABLE CHEST - 1 VIEW  COMPARISON:  Portable exam 0743 hours compared to 09/13/2009  FINDINGS: Upper normal heart size.  Tortuous aorta.  Mediastinal contours and pulmonary vascularity normal.  Lungs grossly clear.  No pleural effusion or pneumothorax.  IMPRESSION: No acute abnormalities.   Electronically Signed   By: Lavonia Dana M.D.   On: 03/12/2015 08:17     Assessment/Plan: 64 year old female with a history of MS that today was noted to have a syncopal episode.  Has been altered since.  On evaluation in the ED, blood pressure is quite low.  Per report of daughter the patient's blood pressure if often elevated.  BUN and creatinine are elevated as well.  Suspect syncopal episode hypotensive in etiology particularly in the clinical scenario of the patient using the bathroom.  Do not suspect seizure.  Mental status may be attributable to dehydration as well.  Head CT personally reviewed and shows no acute changes.     Recommendations: 1.  Hydration 2.  MRI of the brain with and without contrast 3.  Agree with blood cultures  Alexis Goodell, MD [facility] [phone] 03/12/2015, 11:12 AM

## 2015-03-12 NOTE — H&P (Signed)
Date: 03/12/2015               Patient Name:  Wanda [name] MRN: [id number]  DOB: 05-04-1951 Age / Sex: 64 y.o., female   PCP: Harrison Mons, PA-C         Medical Service: Internal Medicine Teaching Service         Attending Physician: Dr. Aldine Contes, MD    First Contact: Dr. Raelene Bott Pager: [phone]  Second Contact: Dr. Naaman Plummer Pager: [phone]       After Hours (After 5p/  First Contact Pager: [phone]  weekends / holidays): Second Contact Pager: [phone]   Chief Complaint: Altered Mental Status  History of Present Illness:   Patient is a 64 year old woman with a history of relapsing remitting MS, hypertension, hyperlipidemia who presents with altered mental status. Patient unable to provide history herself. History is provided by her daughter. Daughter states that patient at baseline lives at home alone. On Saturday, patient was experiencing weakness diffusely. By Monday, the day before presentation, the patient however got better and was well ambulating around the daughter's house with the help of a cane. She was helping her daughter cook. However, the day of presentation, patient was found to be on the toilet and slumped over. She had had a bowel movement and hadn't urinated prior to slumping. Daughter reports that patient since that episode has been waxing and waning in her level of consciousness but has overall been more somnolent. When she is awake, she is quite agitated and sometimes struggle to get breaths. She will repeat "I want to get up!" Daughter denies any signs convulsions. She suspects that patient may at times take more of her prescribed modafinil, Klonopin, and trazodone depending on her symptomatology.  Daughter denies any history of the patient having any seizures, strokes. Patient continues to take Tysabri for her MS and the daughter reports that her last infusion was within the last month.  In the emergency department, patient received 30 mEq of potassium, 1 L normal  saline bolus, and was started on vancomycin and Zosyn.  Meds:  (Not in a hospital admission) Current Facility-Administered Medications  Medication Dose Route Frequency Provider Last Rate Last Dose  . loratadine (CLARITIN) tablet 10 mg  10 mg Oral Once Drema Dallas, DO      . LORazepam (ATIVAN) injection 0.5 mg  0.5 mg Intravenous Once Alexis Goodell, MD      . methylPREDNISolone sodium succinate (SOLU-MEDROL) 500 mg in sodium chloride 0.9 % 50 mL IVPB  500 mg Intravenous PRN Drema Dallas, DO      . piperacillin-tazobactam (ZOSYN) IVPB 3.375 g  3.375 g Intravenous Q8H Tyrone Apple, RPH      . potassium chloride 10 mEq in 100 mL IVPB  10 mEq Intravenous Q1 Hr x 3 Carlisle Cater, PA-C      . [START ON 03/13/2015] vancomycin (VANCOCIN) 500 mg in sodium chloride 0.9 % 100 mL IVPB  500 mg Intravenous Q24H [facility], [facility]       Current Outpatient Prescriptions  Medication Sig Dispense Refill  . atorvastatin (LIPITOR) 20 MG tablet take 1 tablet by mouth once daily 30 tablet 5  . clonazePAM (KLONOPIN) 1 MG tablet Take 1 tablet (1 mg total) by mouth 2 (two) times daily as needed for anxiety. 30 tablet 5  . Cyanocobalamin (B-12 PO) Take 1 tablet by mouth daily.    Marland Kitchen estradiol (ESTRACE) 0.5 MG tablet take 1 tablet by mouth once daily  90 tablet 4  . hydrochlorothiazide (HYDRODIURIL) 25 MG tablet take 1 tablet by mouth once daily 30 tablet 5  . ibuprofen (ADVIL,MOTRIN) 200 MG tablet Take 1,000 mg by mouth daily as needed for mild pain or moderate pain.     . medroxyPROGESTERone (PROVERA) 2.5 MG tablet take 1 tablet by mouth once daily 90 tablet 4  . meloxicam (MOBIC) 15 MG tablet take 1 tablet by mouth once daily 90 tablet 1  . Omega-3 Fatty Acids (FISH OIL PO) Take 1 tablet by mouth daily.    . traZODone (DESYREL) 50 MG tablet Take 1/2 to 1 tablet at bedtime as needed for sleep. 30 tablet 5  . VITAMIN E PO Take 1 tablet by mouth daily.    . modafinil (PROVIGIL) 100 MG tablet take 1 tablet by mouth  twice a day (Patient not taking: Reported on 03/12/2015) 60 tablet 5  . modafinil (PROVIGIL) 200 MG tablet Take 1 tablet (200 mg total) by mouth daily. (Patient not taking: Reported on 03/12/2015) 30 tablet 5  . varenicline (CHANTIX) 1 MG tablet Take 1 tablet (1 mg total) by mouth 2 (two) times daily. (Patient not taking: Reported on 03/12/2015) 180 tablet 1    Past Medical History  Diagnosis Date  . Elevated cholesterol   . Hypertension   . Arthritis   . Cervical dysplasia   . Depression   . Anxiety   . Macular degeneration of right eye 2001  . MS (multiple sclerosis) 08/2013    Past Surgical History  Procedure Laterality Date  . Colposcopy    . Knee surgery    . Cholecystectomy    . Tubal ligation    . Cortisone injections      Bil knees     Allergies: Allergies as of 03/12/2015  . (No Known Allergies)    Family History  Problem Relation Age of Onset  . Cancer Mother     Colon  . Diabetes Mother   . Hypertension Mother   . Arthritis Mother   . Cancer Father     prostate  . Kidney disease Brother     congenital single kidney  . Arthritis Sister   . Arthritis Sister   . Hematuria Son   . Gout Brother   . Multiple sclerosis Brother   . Arthritis Brother   . HIV Brother   . Cancer Brother     spinal    History   Social History  . Marital Status: Widowed    Spouse Name: N/A  . Number of Children: 2  . Years of Education: college   Occupational History  . retired     Social History Main Topics  . Smoking status: Current Every Day Smoker -- 1.00 packs/day for 45 years    Types: Cigarettes    Last Attempt to Quit: 10/23/2013  . Smokeless tobacco: Never Used  . Alcohol Use: No     Comment: hasn't drank anything since jan 2015  . Drug Use: No  . Sexual Activity: No   Other Topics Concern  . Not on file   Social History Narrative   Grew up in [place] area, 1 of 10 siblings - 28 still living, married for 33 years then divorced, moved to [place] to be near  daughter post retirement, als has 1 son. 2 dogs.       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      Review of Systems: All pertinent ROS  as stated in HPI.   Physical Exam: Blood pressure 98/66, pulse 56, temperature 94.5 F (34.7 C), temperature source Core (Comment), resp. rate 28, weight 125 lb (56.7 kg), SpO2 100 %. General: resting in bed, not responsive to painful stimuli HEENT: EOMI, no scleral icterus Cardiac: RRR, no rubs, murmurs or gallops Pulm: clear to auscultation bilaterally, moving normal volumes of air Abd: soft, nontender, nondistended, BS present Ext: warm and well perfused, no pedal edema Neuro: No responsive to painful stimuli, not responsive to commands, normal patellar reflexes, Skin: no rashes or lesions noted  Lab results: Basic Metabolic Panel:  Recent Labs  03/12/15 0835  NA 138  K 2.9*  CL 106  CO2 21*  GLUCOSE 111*  BUN 42*  CREATININE 1.43*  CALCIUM 8.0*  MG 2.8*   Liver Function Tests:  Recent Labs  03/12/15 0835  AST 29  ALT 28  ALKPHOS 59  BILITOT 0.3  PROT 6.1*  ALBUMIN 3.2*   No results for input(s): LIPASE, AMYLASE in the last 72 hours. No results for input(s): AMMONIA in the last 72 hours. CBC:  Recent Labs  03/12/15 0835  WBC 6.9  NEUTROABS 3.4  HGB 12.6  HCT 35.7*  MCV 82.4  PLT 137*   Cardiac Enzymes:  Recent Labs  03/12/15 0835  CKTOTAL 395*   BNP: No results for input(s): PROBNP in the last 72 hours. D-Dimer: No results for input(s): DDIMER in the last 72 hours. CBG:  Recent Labs  03/12/15 0739  GLUCAP 122*   Hemoglobin A1C: No results for input(s): HGBA1C in the last 72 hours. Fasting Lipid Panel: No results for input(s): CHOL, HDL, LDLCALC, TRIG, CHOLHDL, LDLDIRECT in the last 72 hours. Thyroid Function Tests: No results for input(s): TSH, T4TOTAL, FREET4, T3FREE, THYROIDAB in the last 72 hours. Anemia Panel: No results for input(s): VITAMINB12, FOLATE,  FERRITIN, TIBC, IRON, RETICCTPCT in the last 72 hours. Coagulation: No results for input(s): LABPROT, INR in the last 72 hours. Urine Drug Screen: Drugs of Abuse     Component Value Date/Time   LABOPIA NONE DETECTED 03/12/2015 0800   COCAINSCRNUR NONE DETECTED 03/12/2015 0800   LABBENZ NONE DETECTED 03/12/2015 0800   AMPHETMU NONE DETECTED 03/12/2015 0800   THCU NONE DETECTED 03/12/2015 0800   LABBARB NONE DETECTED 03/12/2015 0800    Alcohol Level:  Recent Labs  03/12/15 1000  ETH <5   Urinalysis:  Recent Labs  03/12/15 0735  COLORURINE YELLOW  LABSPEC 1.009  PHURINE 5.0  GLUCOSEU NEGATIVE  HGBUR NEGATIVE  BILIRUBINUR NEGATIVE  KETONESUR NEGATIVE  PROTEINUR NEGATIVE  UROBILINOGEN 0.2  NITRITE NEGATIVE  LEUKOCYTESUR NEGATIVE   Imaging results:  Ct Head Wo Contrast  03/12/2015   CLINICAL DATA:  Syncopal episode  EXAM: CT HEAD WITHOUT CONTRAST  TECHNIQUE: Contiguous axial images were obtained from the base of the skull through the vertex without intravenous contrast.  COMPARISON:  08/25/2013  FINDINGS: Bony calvarium is intact. Mild atrophic changes are noted as well as chronic white matter ischemic change. No findings to suggest acute hemorrhage, acute infarction or space-occupying mass lesion are noted.  IMPRESSION: Chronic atrophic and ischemic changes without acute abnormality.   Electronically Signed   By: Inez Catalina M.D.   On: 03/12/2015 09:08   Dg Chest Port 1 View  03/12/2015   CLINICAL DATA:  Altered mental status, history hypertension, multiple sclerosis  EXAM: PORTABLE CHEST - 1 VIEW  COMPARISON:  Portable exam 0743 hours compared to 09/13/2009  FINDINGS: Upper normal heart size.  Tortuous aorta.  Mediastinal contours and pulmonary vascularity normal.  Lungs grossly clear.  No pleural effusion or pneumothorax.  IMPRESSION: No acute abnormalities.   Electronically Signed   By: Lavonia Dana M.D.   On: 03/12/2015 08:17    Other results: EKG  Interpretation  Date/Time:  Tuesday March 12 2015 07:27:42 EDT Ventricular Rate:  50 PR Interval:  180 QRS Duration: 100 QT Interval:  538 QTC Calculation: 491 R Axis:   54 Text Interpretation:  Sinus rhythm Low voltage, precordial leads Borderline T abnormalities, anterior leads Borderline ST elevation, inferior leads Borderline prolonged QT interval Confirmed by Hazle Coca [phone]) on 03/12/2015 7:42:40 AM   Assessment & Plan by Problem: Active Problems:   Altered level of consciousness  Acute encephalopathy: Waxing and waning level of consciousness in the setting of a syncopal episode. Noted to have hypothermia and hypertension. Possibly secondary to syncopal episode in the setting of dehydration.  No evidence of infection from chest x-ray or urinalysis the patient is hypothermic with hypotension. However, bradycardia seems to speak against this. No evidence of concurrent seizure from history although CK is slightly elevated at 395. Would also question possibility of drug overdose given history of increased dosages of medications (Provigil, Klonopin, trazodone). Klonopin in particular would be consistent with patient's hypothermia, hypotension, bradycardia, and overall lethargy. However, urine drug screen has been pan-negative. No evidence of electrolyte abnormalities contributing to mental status as only remarkable for hypokalemia. Patient also with a history of JC virus positivity in the setting of Tysabri infusions. -Appreciate neurology assistance -Admit to step down -TSH -B 12 -Ethanol level -MRI pending -Klonopin level -Blood cultures pending -Urine culture pending -Continue vancomycin and Zosyn -Repeat blood gas -Consider EEG if above is negative  Relapsing remitting multiple sclerosis: [facility]. At home, patient is on modafinil 100 mg twice a day and sees Tysabri infusions. Some history of increasing weakness prior to episode as described  above. -Repeat MRI as above -Hold home regimen in the setting of nothing by mouth  Hypermagnesemia: Admission level of 2.8. Unclear etiology. -Monitor in the morning.  Hypokalemia: Admission potassium of 2.9. Status post 30 mEq of potassium in the emergency department. -Repeat another 30 mEq of potassium  FOBT positive: No present symptoms. Obtain in the ED because of convenience of rectal exam. Hemoglobin stable compared to priors at 12.6.  Hypertension: Patient is on hydrochlorothiazide 25 mg daily. -Hold in the setting of nothing by mouth  Dispo: Disposition is deferred at this time, awaiting improvement of current medical problems. Anticipated discharge in approximately 3 day(s).  -Came from home  The patient does have a current PCP ([name] Jacqulynn Cadet, PA-C) and does need an [facility] hospital follow-up appointment after discharge.  The patient does not have transportation limitations that hinder transportation to clinic appointments.  Signed: Luan Moore, M.D., Ph.D. Internal Medicine Teaching Service, PGY-1 03/12/2015, 12:50 PM

## 2015-03-12 NOTE — ED Notes (Signed)
Pt unresponsive to pain again at this time. Vital signs stable. MD notified.

## 2015-03-12 NOTE — Telephone Encounter (Signed)
Patients daughter [name]) called stating pt has been admitted to [facility] hospital. The appointment for today has been cancelled. She would like a return call at [phone] to discuss mother's condition.

## 2015-03-12 NOTE — Telephone Encounter (Signed)
Spoke [name] (pt's dgt on [employer]) - her mother started experiencing increased weakness and worsening of gait last week - she was concerned it was MS related and scheduled a follow up appt for today.  This morning her mother became weaker and passed out.  She was taken to the ED and is being admitted for dehydration.  [name] told me she would call back to re-schedule once her mother is discharged.

## 2015-03-12 NOTE — ED Notes (Signed)
[facility]- pt coming from home after having syncopal episode while using the restroom. Pt still with altered mental status. Hx of MS. Pt usually ambulatory at baseline. Pt non-verbal other than certain comments she will make.

## 2015-03-12 NOTE — Progress Notes (Signed)
ANTIBIOTIC CONSULT NOTE - INITIAL  Pharmacy Consult:  Vancomycin / Zosyn Indication:  Sepsis  No Known Allergies  Patient Measurements: Weight: 125 lb (56.7 kg)  Vital Signs: Temp: 94.1 F (34.5 C) (06/07 0930) Temp Source: Core (Comment) (06/07 0921) BP: 90/58 mmHg (06/07 0930) Pulse Rate: 53 (06/07 0930)  Labs:  Recent Labs  03/12/15 0835  WBC 6.9  HGB 12.6  PLT 137*  CREATININE 1.43*   Estimated Creatinine Clearance: 30 mL/min (by C-G formula based on Cr of 1.43). No results for input(s): VANCOTROUGH, VANCOPEAK, VANCORANDOM, GENTTROUGH, GENTPEAK, GENTRANDOM, TOBRATROUGH, TOBRAPEAK, TOBRARND, AMIKACINPEAK, AMIKACINTROU, AMIKACIN in the last 72 hours.   Microbiology: No results found for this or any previous visit (from the past 720 hour(s)).  Medical History: Past Medical History  Diagnosis Date  . Elevated cholesterol   . Hypertension   . Arthritis   . Cervical dysplasia   . Depression   . Anxiety   . Macular degeneration of right eye 2001  . MS (multiple sclerosis) 08/2013      Assessment: 57 YOF with history of multiple sclerosis presented s/p syncopal episode while using the restroom and AMS.  Pharmacy consulted to initiate vancomycin and Zosyn for sepsis.  Baseline labs pending collection.   Goal of Therapy:  Vancomycin trough level 15-20 mcg/ml   Plan:  - Vanc 1gm IV x 1 - Zosyn 3.375gm IV x 1, infuse over 30 min - F/U baseline labs for further dosing    Khrystal Jeanmarie D. Mina Marble, PharmD, BCPS Pager:  [phone] 03/12/2015, 10:24 AM   ==========================================    Addendum: - SCr 1.43 (range 0.77-1 since 2015), CrCL 30 ml/min - K+ 2.9 - FOB+   Plan: - Vanc 500mg  IV Q24H - Zosyn 3.375gm IV Q8H, 4 hr infusion - Monitor renal fxn, clinical progress, vanc trough as indicated    Dione Petron D. Mina Marble, PharmD, BCPS Pager:  [phone] 03/12/2015, 10:27 AM

## 2015-03-12 NOTE — H&P (Signed)
Date: 03/12/2015               Patient Name:  Wanda [name] MRN: [id number]  DOB: [birth year] Age / Sex: 64 y.o., female   PCP: Harrison Mons, PA-C         Medical Service: Internal Medicine Teaching Service         Attending Physician: Dr. Aldine Contes, MD                   After Hours (After 5p/  First Contact Pager: [phone]  weekends / holidays): Second Contact Pager: [phone]   Chief Complaint: Patient came in with AMS/Loss of consciousness  History of Present Illness: Wanda [name] is a 64 yo AA F with a PMHx significant for multiple sclerosis, anxiety, depression, arthritis, HTN and hyperlipidemia. She presents to the ED with AMS and confusion. Her consciousness is waxing and waning and she was not conscious during the interview. The history was obtained from her daughter Wanda [name]. She states that when she picked the patient up from her house on Sunday the patient was not acting like herself and had fallen. On Sunday the patient was back to her normal self, she was walking with her cain and she was able to cook with her daughter that night. This morning the daughter heard her mother "rattling around in the pill box" she then needed to go to the toilet and her daughter accompanied her. She says that the patient had defecated and urinated but then suddenly slumped over. Upon arrival at the ER the patient was altered and aggressive she was described as thrashing about by her daughter. She has repeatedly lost consciousness and has been in a constant unconscious state for the past hour before the interview took place. Her daughter describes her mother as getting a lot of relief from the pills which she takes. Even if she gives her an aspirin she says that she feels great after it. She is always willing to try something new if offered and her daughter is concerned about this behavior. She states that she has witnessed her mother taking more than the prescribed dose of a drug before if she thinks  that she needs more to fall asleep or needs more energy. She states that her mother has never expressed notions about killing herself and if it was necessary she would want to be kept alive (full code). Before this episode the patient did not complain of any stomach pains, or body pains, loss of vision, blood in her stools. She has had some dizziness and loss of balance in the past but has never lost consciousness. She has never experienced an episode like this before in the past and this is different from previous MS flares. He daughter does describe her mother complaining of hearing a banging/ popping/knocking noise in her ears the night before admission. She further states that her mother took Magnesium citrate to help her go to the bathroom the night before and it was working. She further describes her mother as not taking in a lot of fluids.   Meds: Current Facility-Administered Medications  Medication Dose Route Frequency Provider Last Rate Last Dose  . 0.9 %  sodium chloride infusion   Intravenous Continuous Marjan Rabbani, MD      . heparin injection 5,000 Units  5,000 Units Subcutaneous 3 times per day Juluis Mire, MD      . loratadine (CLARITIN) tablet 10 mg  10 mg Oral Once Drema Dallas, DO      .  methylPREDNISolone sodium succinate (SOLU-MEDROL) 500 mg in sodium chloride 0.9 % 50 mL IVPB  500 mg Intravenous PRN Drema Dallas, DO      . piperacillin-tazobactam (ZOSYN) IVPB 3.375 g  3.375 g Intravenous Q8H Tyrone Apple, RPH      . potassium chloride 10 mEq in 100 mL IVPB  10 mEq Intravenous Q1 Hr x 3 Marjan Rabbani, MD      . Derrill Memo ON 03/13/2015] vancomycin (VANCOCIN) 500 mg in sodium chloride 0.9 % 100 mL IVPB  500 mg Intravenous Q24H Thuy D Dang, [facility]       Current Outpatient Prescriptions  Medication Sig Dispense Refill  . atorvastatin (LIPITOR) 20 MG tablet take 1 tablet by mouth once daily 30 tablet 5  . clonazePAM (KLONOPIN) 1 MG tablet Take 1 tablet (1 mg total) by mouth 2 (two)  times daily as needed for anxiety. 30 tablet 5  . Cyanocobalamin (B-12 PO) Take 1 tablet by mouth daily.    Marland Kitchen estradiol (ESTRACE) 0.5 MG tablet take 1 tablet by mouth once daily 90 tablet 4  . hydrochlorothiazide (HYDRODIURIL) 25 MG tablet take 1 tablet by mouth once daily 30 tablet 5  . ibuprofen (ADVIL,MOTRIN) 200 MG tablet Take 1,000 mg by mouth daily as needed for mild pain or moderate pain.     . medroxyPROGESTERone (PROVERA) 2.5 MG tablet take 1 tablet by mouth once daily 90 tablet 4  . meloxicam (MOBIC) 15 MG tablet take 1 tablet by mouth once daily 90 tablet 1  . Omega-3 Fatty Acids (FISH OIL PO) Take 1 tablet by mouth daily.    . traZODone (DESYREL) 50 MG tablet Take 1/2 to 1 tablet at bedtime as needed for sleep. 30 tablet 5  . VITAMIN E PO Take 1 tablet by mouth daily.    . modafinil (PROVIGIL) 100 MG tablet take 1 tablet by mouth twice a day (Patient not taking: Reported on 03/12/2015) 60 tablet 5  . modafinil (PROVIGIL) 200 MG tablet Take 1 tablet (200 mg total) by mouth daily. (Patient not taking: Reported on 03/12/2015) 30 tablet 5  . varenicline (CHANTIX) 1 MG tablet Take 1 tablet (1 mg total) by mouth 2 (two) times daily. (Patient not taking: Reported on 03/12/2015) 180 tablet 1    Allergies: Allergies as of 03/12/2015  . (No Known Allergies)   Past Medical History  Diagnosis Date  . Elevated cholesterol   . Hypertension   . Arthritis   . Cervical dysplasia   . Depression   . Anxiety   . Macular degeneration of right eye 2001  . MS (multiple sclerosis) 08/2013   Past Surgical History  Procedure Laterality Date  . Colposcopy    . Knee surgery    . Cholecystectomy    . Tubal ligation    . Cortisone injections      Bil knees   Family History  Problem Relation Age of Onset  . Cancer Mother     Colon  . Diabetes Mother   . Hypertension Mother   . Arthritis Mother   . Cancer Father     prostate  . Kidney disease Brother     congenital single kidney  . Arthritis  Sister   . Arthritis Sister   . Hematuria Son   . Gout Brother   . Multiple sclerosis Brother   . Arthritis Brother   . HIV Brother   . Cancer Brother     spinal   History   Social History  .  Marital Status: Widowed    Spouse Name: N/A  . Number of Children: 2  . Years of Education: college   Occupational History  . retired     Social History Main Topics  . Smoking status: Current Every Day Smoker -- 1.00 packs/day for 45 years    Types: Cigarettes    Last Attempt to Quit: 10/23/2013  . Smokeless tobacco: Never Used  . Alcohol Use: No     Comment: hasn't drank anything since jan 2015  . Drug Use: No  . Sexual Activity: No   Other Topics Concern  . Not on file   Social History Narrative   Grew up in [place] area, 1 of 10 siblings - 60 still living, married for 33 years then divorced, moved to [place] to be near daughter post retirement, als has 1 son. 2 dogs.       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     Review of Systems: Review of systems not obtained due to patient factors.  Physical Exam: Blood pressure 108/66, pulse 62, temperature 95.6 F (35.3 C), temperature source Rectal, resp. rate 14, weight 56.7 kg (125 lb), SpO2 100 %. BP 108/66 mmHg  Pulse 62  Temp(Src) 95.6 F (35.3 C) (Rectal)  Resp 14  Wt 56.7 kg (125 lb)  SpO2 100%  General Appearance:    Patient is unconscious and does not withdraw from pinching, respond to voice or open her eyes on voice or pain. GCS ~ 3  Head:    Normocephalic, without obvious abnormality, atraumatic  Eyes:    PERRL, conjunctiva/corneas clear, pupils were small but not pinpoint and were sluggishly reactive to light.  Lungs:     Clear to auscultation bilaterally, respirations slow but unlabored    Heart:    Regular rate and rhythm, S1 and S2 normal, no murmur, rub   or gallop  Abdomen:     Soft, non distended    no masses, no organomegaly  Rectal:    External hemmorhoids   guaiac positive  stool  Extremities:   Extremities normal, atraumatic, no cyanosis or edema  Pulses:   2+ and symmetric all extremities  Skin:   Skin color, texture, turgor normal, no rashes or lesions  Neurologic:  Patient is comatose    Lab results: Admission on 03/12/2015  Component Date Value Ref Range Status  . Lactic Acid, Venous 03/12/2015 1.56  0.5 - 2.0 mmol/L Final  . Sodium 03/12/2015 138  135 - 145 mmol/L Final  . Potassium 03/12/2015 2.9* 3.5 - 5.1 mmol/L Final  . Chloride 03/12/2015 106  101 - 111 mmol/L Final  . CO2 03/12/2015 21* 22 - 32 mmol/L Final  . Glucose, Bld 03/12/2015 111* 65 - 99 mg/dL Final  . BUN 03/12/2015 42* 6 - 20 mg/dL Final  . Creatinine, Ser 03/12/2015 1.43* 0.44 - 1.00 mg/dL Final  . Calcium 03/12/2015 8.0* 8.9 - 10.3 mg/dL Final  . Total Protein 03/12/2015 6.1* 6.5 - 8.1 g/dL Final  . Albumin 03/12/2015 3.2* 3.5 - 5.0 g/dL Final  . AST 03/12/2015 29  15 - 41 U/L Final  . ALT 03/12/2015 28  14 - 54 U/L Final  . Alkaline Phosphatase 03/12/2015 59  38 - 126 U/L Final  . Total Bilirubin 03/12/2015 0.3  0.3 - 1.2 mg/dL Final  . GFR calc non Af Amer 03/12/2015 38* >60 mL/min Final  . GFR calc Af Amer 03/12/2015 44* >60 mL/min Final  .  Anion gap 03/12/2015 11  5 - 15 Final  . WBC 03/12/2015 6.9  4.0 - 10.5 K/uL Final  . RBC 03/12/2015 4.33  3.87 - 5.11 MIL/uL Final  . Hemoglobin 03/12/2015 12.6  12.0 - 15.0 g/dL Final  . HCT 03/12/2015 35.7* 36.0 - 46.0 % Final  . MCV 03/12/2015 82.4  78.0 - 100.0 fL Final  . MCH 03/12/2015 29.1  26.0 - 34.0 pg Final  . MCHC 03/12/2015 35.3  30.0 - 36.0 g/dL Final  . RDW 03/12/2015 13.9  11.5 - 15.5 % Final  . Platelets 03/12/2015 137* 150 - 400 K/uL Final  . Neutrophils Relative % 03/12/2015 50  43 - 77 % Final  . Lymphocytes Relative 03/12/2015 38  12 - 46 % Final  . Monocytes Relative 03/12/2015 8  3 - 12 % Final  . Eosinophils Relative 03/12/2015 4  0 - 5 % Final  . Basophils Relative 03/12/2015 0  0 - 1 % Final  . Neutro  Abs 03/12/2015 3.4  1.7 - 7.7 K/uL Final  . Lymphs Abs 03/12/2015 2.6  0.7 - 4.0 K/uL Final  . Monocytes Absolute 03/12/2015 0.6  0.1 - 1.0 K/uL Final  . Eosinophils Absolute 03/12/2015 0.3  0.0 - 0.7 K/uL Final  . Basophils Absolute 03/12/2015 0.0  0.0 - 0.1 K/uL Final  . Smear Review 03/12/2015 MORPHOLOGY UNREMARKABLE   Final  . Troponin i, poc 03/12/2015 0.00  0.00 - 0.08 ng/mL Final  . Comment 3 03/12/2015          Final  . pH, Arterial 03/12/2015 7.282* 7.350 - 7.450 Final  . pCO2 arterial 03/12/2015 44.1  35.0 - 45.0 mmHg Final  . pO2, Arterial 03/12/2015 75.0* 80.0 - 100.0 mmHg Final  . Bicarbonate 03/12/2015 20.8  20.0 - 24.0 mEq/L Final  . TCO2 03/12/2015 22  0 - 100 mmol/L Final  . O2 Saturation 03/12/2015 93.0   Final  . Acid-base deficit 03/12/2015 6.0* 0.0 - 2.0 mmol/L Final  . Patient temperature 03/12/2015 98.6 F   Final  . Collection site 03/12/2015 RADIAL, ALLEN'S TEST ACCEPTABLE   Final  . Drawn by 03/12/2015 RT   Final  . Sample type 03/12/2015 ARTERIAL   Final  . Magnesium 03/12/2015 2.8* 1.7 - 2.4 mg/dL Final  . Color, Urine 03/12/2015 YELLOW  YELLOW Final  . APPearance 03/12/2015 CLEAR  CLEAR Final  . Specific Gravity, Urine 03/12/2015 1.009  1.005 - 1.030 Final  . pH 03/12/2015 5.0  5.0 - 8.0 Final  . Glucose, UA 03/12/2015 NEGATIVE  NEGATIVE mg/dL Final  . Hgb urine dipstick 03/12/2015 NEGATIVE  NEGATIVE Final  . Bilirubin Urine 03/12/2015 NEGATIVE  NEGATIVE Final  . Ketones, ur 03/12/2015 NEGATIVE  NEGATIVE mg/dL Final  . Protein, ur 03/12/2015 NEGATIVE  NEGATIVE mg/dL Final  . Urobilinogen, UA 03/12/2015 0.2  0.0 - 1.0 mg/dL Final  . Nitrite 03/12/2015 NEGATIVE  NEGATIVE Final  . Leukocytes, UA 03/12/2015 NEGATIVE  NEGATIVE Final  . Glucose-Capillary 03/12/2015 122* 65 - 99 mg/dL Final  . Total CK 03/12/2015 395* 38 - 234 U/L Final  . Fecal Occult Bld 03/12/2015 POSITIVE* NEGATIVE Final  . Alcohol, Ethyl (B) 03/12/2015 <5  <5 mg/dL Final  . Opiates  03/12/2015 NONE DETECTED  NONE DETECTED Final  . Cocaine 03/12/2015 NONE DETECTED  NONE DETECTED Final  . Benzodiazepines 03/12/2015 NONE DETECTED  NONE DETECTED Final  . Amphetamines 03/12/2015 NONE DETECTED  NONE DETECTED Final  . Tetrahydrocannabinol 03/12/2015 NONE DETECTED  NONE DETECTED Final  .  Barbiturates 03/12/2015 NONE DETECTED  NONE DETECTED Final  . Lactic Acid, Venous 03/12/2015 1.86  0.5 - 2.0 mmol/L Final   Imaging results:  Ct Head Wo Contrast  03/12/2015   CLINICAL DATA:  Syncopal episode  EXAM: CT HEAD WITHOUT CONTRAST  TECHNIQUE: Contiguous axial images were obtained from the base of the skull through the vertex without intravenous contrast.  COMPARISON:  08/25/2013  FINDINGS: Bony calvarium is intact. Mild atrophic changes are noted as well as chronic white matter ischemic change. No findings to suggest acute hemorrhage, acute infarction or space-occupying mass lesion are noted.  IMPRESSION: Chronic atrophic and ischemic changes without acute abnormality.   Electronically Signed   By: Inez Catalina M.D.   On: 03/12/2015 09:08   Dg Chest Port 1 View  03/12/2015   CLINICAL DATA:  Altered mental status, history hypertension, multiple sclerosis  EXAM: PORTABLE CHEST - 1 VIEW  COMPARISON:  Portable exam 0743 hours compared to 09/13/2009  FINDINGS: Upper normal heart size.  Tortuous aorta.  Mediastinal contours and pulmonary vascularity normal.  Lungs grossly clear.  No pleural effusion or pneumothorax.  IMPRESSION: No acute abnormalities.   Electronically Signed   By: Lavonia Dana M.D.   On: 03/12/2015 08:17    Other results: Sinus bradycardia  Assessment & Plan by Problem: Active Problems:   Altered level of consciousness   Acute encephalopathy   Acute AMS / Loss of consciousness: The differential includes, klonopin overdose, trazodone overdose, infection seizure, MS flare, hypovolemia, opiate overdose, PML, myxedema coma.  Klonopin overdose is highest on my differential given  this patients history of using more drugs than prescribes, her AMS on Sunday which resolved to only again reappear on Monday, her respiratory / cardiovascular depression and hypothermia. Although her UDS was negative for benzodiazepines klonopin is excreted mostly in the liver and its detectability on a UDS is questionable. ABG showes slight evidence of respiratory acidosis pattern. pH: 7.28, pCO2: 44.1, pO2: 75, Bicarb: 20.8. The patient also has a respiratory rate which has been oscillating between 9 and 22. Her pupils are also small and sluggish on exam. The patient did get one dose of Lorazepam (0.5 mg) 3 hrs before the interview, which may have been contributing to the above symptoms.   - clonazepam serum level  - watching for improvement / need for intubation  - Repeat UDS  - blood ethanol level.   Hypovolemia is also high on the differential considering her low BP and high BUN and Cr,  but one would expect tachycardia instead of the bradycardia she is experiencing. BUN: 42, Cr: 1.43. The patient has also used magnesium citrate recently and her daughter reports that she does not drink a lot of fluids. Given the patient's mental status it is difficult to obtain orthostatics.   - Bolus the Pt with 1,036mL of NS and look for BP response.  - monitor improvement in BUN / Cr.  - serum CK level    Infection is also possible given that the patient fits SIRS criteria, hypothermia. We will look for a source on infection.  -CXR: No signs of infection  - U/A: no signs of infection  - Empiric Vancomycin / Zosyn Rx.  - Blood cultures x2  - HIV antibody test.   Seizure/ post ictal state is possible given her waxing and waning states of consciousness, the medications she is on and a History of taking more than the prescribed dose of medications. (meloxicam, modafinil, and varenicline)  - watch and wait  for improvement   - Patient will be monitored by nursing and sister in acute setting given her Hx of  AMS.   - Possible EEG if above etiologies are ruled out and symptoms do not abate.   An MS flare / PML should also be considered due to her PMHx, however a flare is less likely to result in Loss of consciousness. Neurology was consulted and reccommended an MRI of the patient's brain to rule out these two possible etiologies. PML is also likely given the patients use of natalimumab to control her disease. Hypothermia and her depressed cardio/ respiratory drive can also have a CNS origin.   - MRI  - JC virus antigen (was slightly elevated previously)    Myxedema coma is less likely but given her hypotension, bradycardia it must be ruled out.  - Obtain a serum TSH, if abnormal obtain a free T4.   Opiate overdose is less likely due to no reported history of opiate use or chronic pain. The UDS is also negative for opiates.   - If the patient's condition rapidly decompensates giving naloxone will not put the  patient in any great risk and it may be therapeutic and diagnostic of opiate overdose.  But it is not needed at this time.   Only having sinus bradycardia on EKG and having normal troponin's makes cardiac ischemia as a possible cause of her condition unlikely.   Hypokalemia: The patient has a K of 2.9 most likely due to hypovolemia or diuretic use.   - NS bolus   - 3 doses of K Cl 10 meq 156mL / hr for 3 hrs.   HTN: As the patient currently has hypotension due to possible hypovolemia, her hydrochlorothiazide will be held.  Diet: NPO  PPX: Heparin 5000 units Q8H  Dispo: Step down unit.   Signed: Rosina Lowenstein, Med Student 03/12/2015, 3:05 PM

## 2015-03-12 NOTE — Telephone Encounter (Signed)
[name] called back and Sharyn Lull was skyped and will pick up.

## 2015-03-12 NOTE — Progress Notes (Signed)
ANTIBIOTIC CONSULT NOTE - INITIAL  Pharmacy Consult:  Vancomycin / Zosyn Indication:  Sepsis  No Known Allergies  Patient Measurements: Weight: 123 lb 14.4 oz (56.2 kg)  Vital Signs: Temp: 94.8 F (34.9 C) (06/07 0732) Temp Source: Rectal (06/07 0732) BP: 95/55 mmHg (06/07 0732) Pulse Rate: 51 (06/07 0732)  Labs: No results for input(s): WBC, HGB, PLT, LABCREA, CREATININE in the last 72 hours. CrCl cannot be calculated (Patient has no serum creatinine result on file.). No results for input(s): VANCOTROUGH, VANCOPEAK, VANCORANDOM, GENTTROUGH, GENTPEAK, GENTRANDOM, TOBRATROUGH, TOBRAPEAK, TOBRARND, AMIKACINPEAK, AMIKACINTROU, AMIKACIN in the last 72 hours.   Microbiology: No results found for this or any previous visit (from the past 720 hour(s)).  Medical History: Past Medical History  Diagnosis Date  . Elevated cholesterol   . Hypertension   . Arthritis   . Cervical dysplasia   . Depression   . Anxiety   . Macular degeneration of right eye 2001  . MS (multiple sclerosis) 08/2013      Assessment: 44 YOF with history of multiple sclerosis presented s/p syncopal episode while using the restroom and AMS.  Pharmacy consulted to initiate vancomycin and Zosyn for sepsis.  Baseline labs pending collection.   Goal of Therapy:  Vancomycin trough level 15-20 mcg/ml   Plan:  - Vanc 1gm IV x 1 - Zosyn 3.375gm IV x 1, infuse over 30 min - F/U baseline labs for further dosing    Hameed Kolar D. Mina Marble, PharmD, BCPS Pager:  [phone] 03/12/2015, 7:52 AM

## 2015-03-12 NOTE — ED Notes (Signed)
Attempted IV access, unsuccessful. Will have phlebotomy draw labs.

## 2015-03-13 ENCOUNTER — Inpatient Hospital Stay (HOSPITAL_COMMUNITY): Payer: Federal, State, Local not specified - PPO

## 2015-03-13 DIAGNOSIS — E860 Dehydration: Secondary | ICD-10-CM | POA: Insufficient documentation

## 2015-03-13 DIAGNOSIS — G9340 Encephalopathy, unspecified: Secondary | ICD-10-CM

## 2015-03-13 DIAGNOSIS — R404 Transient alteration of awareness: Secondary | ICD-10-CM

## 2015-03-13 DIAGNOSIS — G35 Multiple sclerosis: Secondary | ICD-10-CM

## 2015-03-13 DIAGNOSIS — E876 Hypokalemia: Secondary | ICD-10-CM | POA: Insufficient documentation

## 2015-03-13 LAB — CBC WITH DIFFERENTIAL/PLATELET
Basophils Absolute: 0.1 10*3/uL (ref 0.0–0.1)
Basophils Relative: 1 % (ref 0–1)
EOS ABS: 0.3 10*3/uL (ref 0.0–0.7)
Eosinophils Relative: 4 % (ref 0–5)
HCT: 34.1 % — ABNORMAL LOW (ref 36.0–46.0)
Hemoglobin: 12.3 g/dL (ref 12.0–15.0)
LYMPHS ABS: 3.2 10*3/uL (ref 0.7–4.0)
Lymphocytes Relative: 43 % (ref 12–46)
MCH: 29.3 pg (ref 26.0–34.0)
MCHC: 36.1 g/dL — AB (ref 30.0–36.0)
MCV: 81.2 fL (ref 78.0–100.0)
MONO ABS: 0.7 10*3/uL (ref 0.1–1.0)
MONOS PCT: 9 % (ref 3–12)
Neutro Abs: 3.3 10*3/uL (ref 1.7–7.7)
Neutrophils Relative %: 43 % (ref 43–77)
Platelets: 153 10*3/uL (ref 150–400)
RBC: 4.2 MIL/uL (ref 3.87–5.11)
RDW: 14.1 % (ref 11.5–15.5)
WBC: 7.6 10*3/uL (ref 4.0–10.5)

## 2015-03-13 LAB — BASIC METABOLIC PANEL
ANION GAP: 10 (ref 5–15)
BUN: 17 mg/dL (ref 6–20)
CALCIUM: 8.2 mg/dL — AB (ref 8.9–10.3)
CO2: 21 mmol/L — AB (ref 22–32)
CREATININE: 1.08 mg/dL — AB (ref 0.44–1.00)
Chloride: 112 mmol/L — ABNORMAL HIGH (ref 101–111)
GFR calc Af Amer: >60 mL/min (ref >60)
GFR calc non Af Amer: 53 mL/min — ABNORMAL LOW (ref >60)
Glucose, Bld: 83 mg/dL (ref 65–99)
Potassium: 3.4 mmol/L — ABNORMAL LOW (ref 3.5–5.1)
Sodium: 143 mmol/L (ref 135–145)

## 2015-03-13 LAB — CK: CK TOTAL: 384 U/L — AB (ref 38–234)

## 2015-03-13 LAB — T4, FREE: FREE T4: 0.77 ng/dL (ref 0.61–1.12)

## 2015-03-13 LAB — HEMOGLOBIN A1C
Hgb A1c MFr Bld: 6 % — ABNORMAL HIGH (ref 4.8–5.6)
MEAN PLASMA GLUCOSE: 126 mg/dL

## 2015-03-13 LAB — HIV ANTIBODY (ROUTINE TESTING W REFLEX): HIV Screen 4th Generation wRfx: NONREACTIVE

## 2015-03-13 MED ORDER — GADOBENATE DIMEGLUMINE 529 MG/ML IV SOLN
12.0000 mL | Freq: Once | INTRAVENOUS | Status: AC | PRN
  Administered 2015-03-13: 12 mL via INTRAVENOUS

## 2015-03-13 MED ORDER — POTASSIUM CHLORIDE CRYS ER 20 MEQ PO TBCR: 20.0000 meq | EXTENDED_RELEASE_TABLET | Freq: Once | ORAL | Status: DC

## 2015-03-13 NOTE — Evaluation (Signed)
Physical Therapy Evaluation Patient Details Name: Wanda [name] MRN: [id number] DOB: 09/20/1951 Today's Date: 03/13/2015   History of Present Illness  Adm after daughter found her unresponsive on toilet. Pt had begun to feel weak 4 days prior and was staying with her daughter. PMHx-relapsing remitting MS, hypertension, hyperlipidemia     Clinical Impression  Pt admitted with above diagnosis. Pt very unsteady and required RW for safety with gait. Pt insisting she could walk with her cane (however no cane present and PT does not feel it would be safe base on current presentation). She WOULD NOT answer if she thought she was at her baseline with mobility (distracted by wires, IV tubing and pole, use of RW). If daughter arrives and feels this is pt's baseline and is prepared for pt to stay with her, then will defer to their judgement.  As patient is new/unknown to this therapist and currently with functional limitations due to the deficits listed below (see PT Problem List), I believe she would benefit from use of RW and skilled PT to increase their independence and safety with mobility to allow discharge to the venue listed below.       Follow Up Recommendations Home health PT;Supervision/Assistance - 24 hour    Equipment Recommendations  Rolling walker with 5" wheels    Recommendations for Other Services OT consult     Precautions / Restrictions Precautions Precautions: Fall      Mobility  Bed Mobility                  Transfers Overall transfer level: Needs assistance Equipment used: Rolling walker (2 wheeled) Transfers: Sit to/from Stand Sit to Stand: Min assist         General transfer comment: vc for safe use of RW (reports she has not used one previously); steady assist as transitioned hands from chair to RW  Ambulation/Gait Ambulation/Gait assistance: Mod assist Ambulation Distance (Feet): 30 Feet (limited by telemetry cording) Assistive device: Rolling walker  (2 wheeled)       General Gait Details: wide base of support with incr Rt/Lt weight-shifting (to point of decr balance and need for external support); limited quality assessment due to lines/IV/croweded area to walk in  Stairs            Wheelchair Mobility    Modified Rankin (Stroke Patients Only)       Balance Overall balance assessment: Needs assistance (denies h/o falls)         Standing balance support: No upper extremity supported Standing balance-Leahy Scale: Fair (can stand statically without UE support; dynamically needs )                               Pertinent Vitals/Pain Pain Assessment: Faces Faces Pain Scale: Hurts little more Pain Location: Lt neck/shoulder Pain Intervention(s): Limited activity within patient's tolerance;Monitored during session    [place] expects to be discharged to:: Private residence Living Arrangements: Alone Available Help at Discharge: Family;Available 24 hours/day (can stay with daughter as PTA) Type of Home: House Home Access: Level entry     Home Layout: One level Home Equipment: Cane - single point      Prior Function Level of Independence: Needs assistance   Gait / Transfers Assistance Needed: modified independent with cane (began using cane 10/15 with last MS relapse)  ADL's / Homemaking Assistance Needed: ? assist with I ADLs (pt gets off topic and difficult  to redirect and get straight answer)        Hand Dominance        Extremity/Trunk Assessment   Upper Extremity Assessment: Defer to OT evaluation (RUE globally weak "for sometime now")           Lower Extremity Assessment: RLE deficits/detail RLE Deficits / Details: knee extension 3+    Cervical / Trunk Assessment: Other exceptions (general weakness)  Communication   Communication: Expressive difficulties (halting, loses track mid-thought and starts over again)  Cognition Arousal/Alertness:  Awake/alert Behavior During Therapy: Anxious;Impulsive Overall Cognitive Status: No family/caregiver present to determine baseline cognitive functioning (easily distracted/gets off topic, then repeats herself)                      General Comments General comments (skin integrity, edema, etc.): Session interrupted by medical/physician group with PT leaving and returning     Exercises        Assessment/Plan    PT Assessment Patient needs continued PT services  PT Diagnosis Difficulty walking;Generalized weakness   PT Problem List Decreased strength;Decreased balance;Decreased mobility;Decreased cognition;Decreased safety awareness;Decreased knowledge of use of DME;Impaired tone  PT Treatment Interventions DME instruction;Gait training;Functional mobility training;Therapeutic activities;Balance training;Neuromuscular re-education;Cognitive remediation;Patient/family education   PT Goals (Current goals can be found in the Care Plan section) Acute Rehab PT Goals Patient Stated Goal: go home today and use her cane PT Goal Formulation: With patient Time For Goal Achievement: 03/20/15 Potential to Achieve Goals: Good    Frequency Min 3X/week   Barriers to discharge        Co-evaluation               End of Session   Activity Tolerance: Patient tolerated treatment well Patient left: in chair;with call bell/phone within reach Nurse Communication: Mobility status         Time: 9604-5409 (time adjusted due to interruptions) PT Time Calculation (min) (ACUTE ONLY): 22 min   Charges:   PT Evaluation $Initial PT Evaluation Tier I: 1 Procedure     PT G Codes:        Quavion Boule Mar 30, 2015, 12:05 PM Pager [phone]

## 2015-03-13 NOTE — Progress Notes (Signed)
EEG completed; results pending.    

## 2015-03-13 NOTE — Progress Notes (Signed)
Subjective: Patient much improved today.  Feels that she is at baseline.  Remembers very little from yesterday.    Objective: Current vital signs: BP 118/70 mmHg  Pulse 61  Temp(Src) 97.9 F (36.6 C) (Oral)  Resp 16  Ht 5' (1.524 m)  Wt 58.968 kg (130 lb)  BMI 25.39 kg/m2  SpO2 94% Vital signs in last 24 hours: Temp:  [95.6 F (35.3 C)-98.6 F (37 C)] 97.9 F (36.6 C) (06/08 0830) Pulse Rate:  [54-70] 61 (06/08 0830) Resp:  [12-22] 16 (06/08 0830) BP: (88-127)/(44-82) 118/70 mmHg (06/08 0830) SpO2:  [92 %-100 %] 94 % (06/08 0830) Weight:  [58.968 kg (130 lb)-59.603 kg (131 lb 6.4 oz)] 58.968 kg (130 lb) (06/08 0400)  Intake/Output from previous day: 06/07 0701 - 06/08 0700 In: 3100 [I.V.:3000; IV Piggyback:100] Out: 1400 [Urine:1400] Intake/Output this shift: Total I/O In: 240 [P.O.:240] Out: -  Nutritional status: Diet Carb Modified Fluid consistency:: Thin; Room service appropriate?: Yes  Neurologic Exam: Mental Status: Alert and wake sitting in the chair beside the bed.  Follows commands. Speech slightly dysarthric but fluent.   Cranial Nerves: II: Discs flat bilaterally; Visual fields grossly normal, pupils equal, round, reactive to light and accommodation III,IV, VI: ptosis not present, extra-ocular motions intact bilaterally V,VII: smile symmetric, facial light touch sensation normal bilaterally VIII: hearing normal bilaterally IX,X: gag reflex present XI: bilateral shoulder shrug XII: midline tongue extension Motor: Lifts bot harms and legs off the bed.   Sensory: Pinprick and light touch intact throughout, bilaterally Deep Tendon Reflexes: 2+ and symmetric throughout Plantars: Right: muteLeft: mute   Lab Results: Basic Metabolic Panel:  Recent Labs Lab 03/12/15 0835 03/12/15 1627 03/13/15 0536  NA 138  --  143  K 2.9*  --  3.4*  CL 106  --  112*  CO2 21*  --  21*  GLUCOSE 111*  --  83  BUN 42*  --  17  CREATININE  1.43*  --  1.08*  CALCIUM 8.0*  --  8.2*  MG 2.8* 2.4  --   PHOS  --  3.3  --     Liver Function Tests:  Recent Labs Lab 03/12/15 0835  AST 29  ALT 28  ALKPHOS 59  BILITOT 0.3  PROT 6.1*  ALBUMIN 3.2*   No results for input(s): LIPASE, AMYLASE in the last 168 hours. No results for input(s): AMMONIA in the last 168 hours.  CBC:  Recent Labs Lab 03/12/15 0835 03/13/15 0536  WBC 6.9 7.6  NEUTROABS 3.4 3.3  HGB 12.6 12.3  HCT 35.7* 34.1*  MCV 82.4 81.2  PLT 137* 153    Cardiac Enzymes:  Recent Labs Lab 03/12/15 0835 03/12/15 1627 03/13/15 0536  CKTOTAL 395* 450* 384*    Lipid Panel:  Recent Labs Lab 03/12/15 1627  CHOL 99  TRIG 38  HDL 45  CHOLHDL 2.2  VLDL 8  LDLCALC 46    CBG:  Recent Labs Lab 03/12/15 0739  GLUCAP 122*    Microbiology: Results for orders placed or performed during the hospital encounter of 03/12/15  Blood Culture (routine x 2)     Status: None (Preliminary result)   Collection Time: 03/12/15  8:35 AM  Result Value Ref Range Status   Specimen Description BLOOD HAND RIGHT  Final   Special Requests AEB 5CC  Final   Culture   Final           BLOOD CULTURE RECEIVED NO GROWTH TO DATE CULTURE WILL BE HELD  FOR 5 DAYS BEFORE ISSUING A FINAL NEGATIVE REPORT Performed at [facility]    Report Status PENDING  Incomplete  Blood Culture (routine x 2)     Status: None (Preliminary result)   Collection Time: 03/12/15 10:45 AM  Result Value Ref Range Status   Specimen Description BLOOD LEFT HAND  Final   Special Requests BOTTLES DRAWN AEROBIC ONLY 4CC  Final   Culture   Final           BLOOD CULTURE RECEIVED NO GROWTH TO DATE CULTURE WILL BE HELD FOR 5 DAYS BEFORE ISSUING A FINAL NEGATIVE REPORT Performed at [facility]    Report Status PENDING  Incomplete  MRSA PCR Screening     Status: None   Collection Time: 03/12/15  5:40 PM  Result Value Ref Range Status   MRSA by PCR NEGATIVE NEGATIVE Final    Comment:         The GeneXpert MRSA Assay (FDA approved for NASAL specimens only), is one component of a comprehensive MRSA colonization surveillance program. It is not intended to diagnose MRSA infection nor to guide or monitor treatment for MRSA infections.     Coagulation Studies:  Recent Labs  03/12/15 1627  LABPROT 14.1  INR 1.07    Imaging: Ct Head Wo Contrast  03/12/2015   CLINICAL DATA:  Syncopal episode  EXAM: CT HEAD WITHOUT CONTRAST  TECHNIQUE: Contiguous axial images were obtained from the base of the skull through the vertex without intravenous contrast.  COMPARISON:  08/25/2013  FINDINGS: Bony calvarium is intact. Mild atrophic changes are noted as well as chronic white matter ischemic change. No findings to suggest acute hemorrhage, acute infarction or space-occupying mass lesion are noted.  IMPRESSION: Chronic atrophic and ischemic changes without acute abnormality.   Electronically Signed   By: Inez Catalina M.D.   On: 03/12/2015 09:08   Dg Chest Port 1 View  03/12/2015   CLINICAL DATA:  Altered mental status, history hypertension, multiple sclerosis  EXAM: PORTABLE CHEST - 1 VIEW  COMPARISON:  Portable exam 0743 hours compared to 09/13/2009  FINDINGS: Upper normal heart size.  Tortuous aorta.  Mediastinal contours and pulmonary vascularity normal.  Lungs grossly clear.  No pleural effusion or pneumothorax.  IMPRESSION: No acute abnormalities.   Electronically Signed   By: Lavonia Dana M.D.   On: 03/12/2015 08:17    Medications:  I have reviewed the patient's current medications. Scheduled: . heparin  5,000 Units Subcutaneous 3 times per day  . piperacillin-tazobactam (ZOSYN)  IV  3.375 g Intravenous Q8H  . vancomycin  500 mg Intravenous Q24H    Assessment/Plan: 64 year old female with MS presenting with altered mental status.  Felt to be dehydrated.  BP improved today.  BUN and Cr improved as well.   Recommendations: 1.  EEG 2.  MRI of the brain pending 3.  PT   LOS:  1 day   Alexis Goodell, MD [facility] [phone] 03/13/2015  11:14 AM

## 2015-03-13 NOTE — Discharge Summary (Signed)
[facility] Discharge Summary  Patient name: Wanda [name] Medical record number: [id number] Date of birth: 1951-06-08 Age: 64 y.o. Gender: female Date of Admission: 03/12/2015  Date of Discharge: 03/13/2015  Admitting Physician: Aldine Contes, MD  Primary Care Provider: JEFFERY,[name], PA-C Consultants: neurology  Indication for Hospitalization: syncope, AMS  Discharge Diagnoses/Problem List:  Altered mental status-resolved Syncope Relapsing remitting multiple sclerosis Hypermagnesemia-resolved Hypokalemia-resolved FOBT positive Hypertension  Disposition: home  Discharge Condition: improved  Discharge Exam:  Filed Vitals:   03/13/15 0830  BP: 118/70  Pulse: 61  Temp: 97.9 F (36.6 C)  Resp: 16   General: Sitting comfortably in bed, eating breakfast, no acute distress HEENT: NCAT, MMM, PERRLA, EOMI, no scleral icterus Cardiac: RRR, no rubs, murmurs or gallops Pulm: CTAB, normal WOB Abd: soft, NTND, BS present Ext: WWP, no edema Neuro: AAO 3, speech slightly dysarthric but fluent, follows commands, cranial nerves intact, no focal deficits Skin: no rashes or lesions noted  Brief Hospital Course:  Wanda [name] is a 64 y.o. female p/w altered mental status.  PMH significant for multiple sclerosis, anxiety, depression, OA, HTN, HLD.  Acute encephalopathy/Altered Mental Status, Syncope: Patient presented to ED via EMS for waxing and waning mental status after a witnessed syncopal episode with transient LOC.  Noted to he hypothermic and hypotensive in the ED, but no evidence of infection on CXR or UA.  Patient was started on empiric Vancomycin and Zosyn after blood and urine cultures were collected.  Neurology was consulted in the ED and thought this was most likely related to hypotension in the setting of hypovolemic syncope.  This could also possibly reflect a flare of the patient's multiple sclerosis, but this is less likely given how quickly  the symptoms resolved. There was some concern about possibility of drug overdose given the history of the patient titrating her home Provigil, Klonopin, trazodone, but UDS pan-negative. Other labs including electrolytes, B-12 level, ethanol level, ABG all within normal limits. TSH mildly low at 0.198, but free T4 within normal limits at 0.77. Patient was monitored on telemetry overnight with no events.  Morning of discharge, patient is back to her mental status baseline. EEG (normal) and MRI brain completed before discharge. MRI brain showed age indeterminate 6 mm nodule concerning for possible new hemorrhage. Repeat CT head was obtained which showed no hemorrhage. Neurology agreed to discharge as patient will have close follow-up with her outpatient neurologist.  Patient was afebrile without infectious symptoms since admission and therefore antibiotics were discontinued prior to discharge. Blood cultures and urine culture pending at time of discharge.  Hypermagnesemia: Magnesium 2.8 on admission but resolved to 2.4 on day of discharge without intervention. Etiology unclear.  Hypokalemia: Potassium 2.9 on admission. Patient given a total of 60 mEq of potassium. K 3.4 on morning of discharge and given an additional 71mEq of KDur.  FOBT positive: Patient without any symptoms of bleeding or GI symptoms. FOBT was obtained in the ED because of convenience while doing rectal exam. Hemoglobin stable around baseline at 12.6.  Hypertension: Patient initially hypotensive to 80s over 60s in the emergency department. Home HCTZ held and patient hydrated with IV fluids. Normotensive on discharge and HCTZ continued to be held.  All other chronic medical conditions stable throughout admission and managed by home regimens.    Issues for Follow Up:  - f/u ongoing MS treatment with neurology - f/u hydration status to prevent hypovolemic syncope in the future - F/u potassium - f/u BP -  consider restarting home HCTZ -  f/u CT and MRI findings - radiologist recommends follow-up brain MRI in 3-6 months  Significant Procedures: none  Significant Labs and Imaging:   Recent Labs Lab 03/12/15 0835 03/13/15 0536  WBC 6.9 7.6  HGB 12.6 12.3  HCT 35.7* 34.1*  PLT 137* 153    Recent Labs Lab 03/12/15 0835 03/12/15 1627 03/13/15 0536  NA 138  --  143  K 2.9*  --  3.4*  CL 106  --  112*  CO2 21*  --  21*  GLUCOSE 111*  --  83  BUN 42*  --  17  CREATININE 1.43*  --  1.08*  CALCIUM 8.0*  --  8.2*  MG 2.8* 2.4  --   PHOS  --  3.3  --   ALKPHOS 59  --   --   AST 29  --   --   ALT 28  --   --   ALBUMIN 3.2*  --   --     ABG    Component Value Date/Time   PHART 7.350 03/12/2015 1819   PCO2ART 37.2 03/12/2015 1819   PO2ART 79.8* 03/12/2015 1819   HCO3 20.0 03/12/2015 1819   TCO2 21.2 03/12/2015 1819   ACIDBASEDEF 4.6* 03/12/2015 1819   O2SAT 94.8 03/12/2015 1819    Urinalysis    Component Value Date/Time   COLORURINE YELLOW 03/12/2015 [place] 03/12/2015 0735   LABSPEC 1.009 03/12/2015 0735   PHURINE 5.0 03/12/2015 0735   GLUCOSEU NEGATIVE 03/12/2015 0735   HGBUR NEGATIVE 03/12/2015 0735   BILIRUBINUR NEGATIVE 03/12/2015 0735   BILIRUBINUR Negative 08/10/2014 1304   BILIRUBINUR neg 08/21/2013 1417   KETONESUR NEGATIVE 03/12/2015 0735   PROTEINUR NEGATIVE 03/12/2015 0735   PROTEINUR neg 08/21/2013 1417   UROBILINOGEN 0.2 03/12/2015 0735   UROBILINOGEN 0.2 08/21/2013 1417   NITRITE NEGATIVE 03/12/2015 0735   NITRITE Negative 08/10/2014 1304   NITRITE neg 08/21/2013 1417   LEUKOCYTESUR NEGATIVE 03/12/2015 0735   LEUKOCYTESUR 1+* 08/10/2014 1304     Results for orders placed or performed during the hospital encounter of 03/12/15 (from the past 48 hour(s))  Urine rapid drug screen (hosp performed)     Status: None   Collection Time: 03/12/15  8:00 AM  Result Value Ref Range   Opiates NONE DETECTED NONE DETECTED   Cocaine NONE DETECTED NONE DETECTED    Benzodiazepines NONE DETECTED NONE DETECTED   Amphetamines NONE DETECTED NONE DETECTED   Tetrahydrocannabinol NONE DETECTED NONE DETECTED   Barbiturates NONE DETECTED NONE DETECTED  Comprehensive metabolic panel     Status: Abnormal  CBC WITH DIFFERENTIAL     Status: Abnormal  Magnesium     Status: Abnormal   Collection Time: 03/12/15  8:35 AM  Result Value Ref Range   Magnesium 2.8 (H) 1.7 - 2.4 mg/dL  CK     Status: Abnormal   Collection Time: 03/12/15  8:35 AM  Result Value Ref Range   Total CK 395 (H) 38 - 234 U/L  I-stat troponin, ED (not at [facility])     Status: None   Collection Time: 03/12/15  8:44 AM  Result Value Ref Range   Troponin i, poc 0.00 0.00 - 0.08 ng/mL   Comment 3            Comment: Due to the release kinetics of cTnI, a negative result within the first hours of the onset of symptoms does not rule out myocardial infarction with  certainty. If myocardial infarction is still suspected, repeat the test at appropriate intervals.   I-Stat CG4 Lactic Acid, ED  (not at  [facility])     Status: None   Collection Time: 03/12/15  8:47 AM  Result Value Ref Range   Lactic Acid, Venous 1.56 0.5 - 2.0 mmol/L  POC occult blood, ED Provider will collect     Status: Abnormal   Collection Time: 03/12/15  9:01 AM  Result Value Ref Range   Fecal Occult Bld POSITIVE (A) NEGATIVE  Ethanol     Status: None   Collection Time: 03/12/15 10:00 AM  Result Value Ref Range   Alcohol, Ethyl (B) <5 <5 mg/dL  I-Stat CG4 Lactic Acid, ED  (not at  [facility])     Status: None   Collection Time: 03/12/15 10:55 AM  Result Value Ref Range   Lactic Acid, Venous 1.86 0.5 - 2.0 mmol/L  Vitamin B12     Status: Abnormal   Collection Time: 03/12/15  4:27 PM  Result Value Ref Range   Vitamin B-12 >7500 (H) 180 - 914 pg/mL    Comment: (NOTE) This assay is not validated for testing neonatal or myeloproliferative syndrome specimens for Vitamin B12 levels.   TSH     Status: Abnormal   Collection Time:  03/12/15  4:27 PM  Result Value Ref Range   TSH 0.198 (L) 0.350 - 4.500 uIU/mL  Protime-INR     Status: None   Collection Time: 03/12/15  4:27 PM  Result Value Ref Range   Prothrombin Time 14.1 11.6 - 15.2 seconds   INR 1.07 0.00 - 1.49  Phosphorus     Status: None   Collection Time: 03/12/15  4:27 PM  Result Value Ref Range   Phosphorus 3.3 2.5 - 4.6 mg/dL  Magnesium     Status: None   Collection Time: 03/12/15  4:27 PM  Result Value Ref Range   Magnesium 2.4 1.7 - 2.4 mg/dL  Lipid panel     Status: None   Collection Time: 03/12/15  4:27 PM  Result Value Ref Range   Cholesterol 99 0 - 200 mg/dL   Triglycerides 38 <150 mg/dL   HDL 45 >40 mg/dL   Total CHOL/HDL Ratio 2.2 RATIO   VLDL 8 0 - 40 mg/dL   LDL Cholesterol 46 0 - 99 mg/dL  HIV antibody     Status: None   Collection Time: 03/12/15  4:27 PM  Result Value Ref Range   HIV Screen 4th Generation wRfx Non Reactive Non Reactive  Hemoglobin A1c     Status: Abnormal   Collection Time: 03/12/15  4:27 PM  Result Value Ref Range   Hgb A1c MFr Bld 6.0 (H) 4.8 - 5.6 %  Folate     Status: None   Collection Time: 03/12/15  4:27 PM  Result Value Ref Range   Folate 12.5 >5.9 ng/mL  CK     Status: Abnormal   Collection Time: 03/12/15  4:27 PM  Result Value Ref Range   Total CK 450 (H) 38 - 234 U/L  T4, free     Status: None   Collection Time: 03/13/15  5:36 AM  Result Value Ref Range   Free T4 0.77 0.61 - 1.12 ng/dL  CK     Status: Abnormal   Collection Time: 03/13/15  5:36 AM  Result Value Ref Range   Total CK 384 (H) 38 - 234 U/L    Ct Head Wo Contrast (03/13/2015): IMPRESSION: 1. The  previously noted 6 mm subependymal nodule on MRI of the brain 03/13/2015 is not demonstrated by today's CT scan (and was not visualized on yesterday's CT scan). This does not appear to represent subependymal hemorrhage. This remains of uncertain etiology and significance, and attention to this region on follow-up brain MRI with and without IV  gadolinium in 3-6 months is suggested to ensure the stability of this finding. 2. Extensive white matter changes redemonstrated, compatible with the patient's reported clinical history of multiple sclerosis. 3. Old lacunar infarct in the left basal ganglia.    Ct Head Wo Contrast (03/12/2015)   IMPRESSION: Chronic atrophic and ischemic changes without acute abnormality.    Mr Jeri Cos Wo Contrast (03/13/2015)   IMPRESSION: 1. 6 mm ependymal based nodule at the atrium of the left lateral ventricle with appearance suggesting recent hemorrhage, but not seen by CT yesterday. Suggest repeat head CT and outpatient follow-up to correlate with outside MRs. 2. Advanced multiple sclerosis with corpus callosum thinning. No associated enhancement or diffusion restriction.   Dg Chest Port 1 View (03/12/2015)     IMPRESSION: No acute abnormalities.     Results/Tests Pending at Time of Discharge: BCx x2, UCx, klonopin level  Discharge Medications:    Medication List    STOP taking these medications        hydrochlorothiazide 25 MG tablet  Commonly known as:  HYDRODIURIL     meloxicam 15 MG tablet  Commonly known as:  MOBIC      TAKE these medications        atorvastatin 20 MG tablet  Commonly known as:  LIPITOR  take 1 tablet by mouth once daily     B-12 PO  Take 1 tablet by mouth daily.     clonazePAM 1 MG tablet  Commonly known as:  KLONOPIN  Take 1 tablet (1 mg total) by mouth 2 (two) times daily as needed for anxiety.     estradiol 0.5 MG tablet  Commonly known as:  ESTRACE  take 1 tablet by mouth once daily     FISH OIL PO  Take 1 tablet by mouth daily.     ibuprofen 200 MG tablet  Commonly known as:  ADVIL,MOTRIN  Take 1,000 mg by mouth daily as needed for mild pain or moderate pain.     medroxyPROGESTERone 2.5 MG tablet  Commonly known as:  PROVERA  take 1 tablet by mouth once daily     modafinil 200 MG tablet  Commonly known as:  PROVIGIL  Take 1 tablet (200 mg total) by mouth  daily.     traZODone 50 MG tablet  Commonly known as:  DESYREL  Take 1/2 to 1 tablet at bedtime as needed for sleep.     varenicline 1 MG tablet  Commonly known as:  CHANTIX  Take 1 tablet (1 mg total) by mouth 2 (two) times daily.     VITAMIN E PO  Take 1 tablet by mouth daily.        Discharge Instructions: Please refer to Patient Instructions section of EMR for full details.  Patient was counseled important signs and symptoms that should prompt return to medical care, changes in medications, dietary instructions, activity restrictions, and follow up appointments.   Follow-Up Appointments: Follow-up Information    Schedule an appointment as soon as possible for a visit with JEFFERY,[name], PA-C.   Specialty:  Physician Assistant   Why:  For hospital follow-up   Contact information:   [place]  [place] [address] [phone]       Schedule an appointment as soon as possible for a visit with Marcial Pacas, MD.   Specialty:  Neurology   Why:  For hospital follow-up   Contact information:   [place] [address] [phone]       Virginia Crews, MD 03/13/2015, 6:06 PM PGY-1, [place]

## 2015-03-13 NOTE — Procedures (Signed)
ELECTROENCEPHALOGRAM REPORT   Patient: Wanda [name]      Room #: 7D-53 Age: 64 y.o.        Sex: female Referring Physician: Dr Erin Hearing Report Date:  03/13/2015        Interpreting Physician: Hulen Luster  History: Wanda [name] is an 64 y.o. female admitted with altered mental status  Medications:  Scheduled: . heparin  5,000 Units Subcutaneous 3 times per day    Conditions of Recording:  This is a 16 channel EEG carried out with the patient in the awake state.  Description:  The waking background activity consists of a low voltage, symmetrical, fairly well organized, 8-10 Hz alpha activity, seen from the parieto-occipital and posterior temporal regions.  Low voltage fast activity, poorly organized, is seen anteriorly and is at times superimposed on more posterior regions. No focal slowing or epileptiform activity noted.   The patient drowses with slowing to irregular, low voltage theta and beta activity. Normal sleep architecture is not observed. Hyperventilation and intermittent photic stimulation was not observed.   IMPRESSION: Normal electroencephalogram. There are no focal lateralizing or epileptiform features.   Jim Like, DO [facility] [phone]  If 7pm- 7am, please page neurology on call as listed in AM[facility]. 03/13/2015, 2:52 PM

## 2015-03-13 NOTE — Evaluation (Signed)
Clinical/Bedside Swallow Evaluation Patient Details  Name: Wanda [name] MRN: [id number] Date of Birth: [birth year]  Today's Date: 03/13/2015 Time: SLP Start Time (ACUTE ONLY): 1155 SLP Stop Time (ACUTE ONLY): 1207 SLP Time Calculation (min) (ACUTE ONLY): 12 min  Past Medical History:  Past Medical History  Diagnosis Date  . Elevated cholesterol   . Hypertension   . Arthritis   . Cervical dysplasia   . Depression   . Anxiety   . Macular degeneration of right eye 2001  . MS (multiple sclerosis) 08/2013   Past Surgical History:  Past Surgical History  Procedure Laterality Date  . Colposcopy    . Knee surgery    . Cholecystectomy    . Tubal ligation    . Cortisone injections      Bil knees   HPI:  64 year old female with MS presenting with altered mental status and found to be dehydrated. Work-up still underway.   Assessment / Plan / Recommendation Clinical Impression  Pt's oropharyngeal swallow appears within gross functional limits despite slight impulsivity with intake. Timing and strength appear adequate with no overt signs of aspiration. Recommend to continue with regular textures and thin liquids. No f/u SLP indicated for swallowing at this time.    Aspiration Risk  Mild    Diet Recommendation Age appropriate regular solids;Thin   Medication Administration: Whole meds with liquid Compensations: Slow rate;Small sips/bites    Other  Recommendations Oral Care Recommendations: Oral care BID   Follow Up Recommendations   N/A   Pertinent Vitals/Pain [facility]    SLP Swallow Goals     Swallow Study Prior Functional Status  Type of Home: House Available Help at Discharge: Family;Available 24 hours/day (can stay with daughter as PTA)    General Other Pertinent Information: 64 year old female with MS presenting with altered mental status and found to be dehydrated. Work-up still underway. Type of Study: Bedside swallow evaluation Previous Swallow Assessment: none in  chart Diet Prior to this Study: Regular;Thin liquids Temperature Spikes Noted: No Respiratory Status: Room air History of Recent Intubation: No Behavior/Cognition: Alert;Cooperative;Pleasant mood;Requires cueing Oral Cavity - Dentition: Adequate natural dentition/normal for age Self-Feeding Abilities: Able to feed self Patient Positioning: Upright in chair/Tumbleform Baseline Vocal Quality: Other (comment) (mildly rough)    Oral/Motor/Sensory Function Overall Oral Motor/Sensory Function: Appears within functional limits for tasks assessed   Ice Chips Ice chips: Not tested   Thin Liquid Thin Liquid: Within functional limits Presentation: Self Fed;Straw    Nectar Thick Nectar Thick Liquid: Not tested   Honey Thick Honey Thick Liquid: Not tested   Puree Puree: Within functional limits Presentation: Self Fed;Spoon   Solid    Solid: Within functional limits Presentation: Self Fed      Germain Osgood, M.A. CCC-SLP [phone]  Germain Osgood 03/13/2015,12:12 PM

## 2015-03-13 NOTE — Discharge Instructions (Signed)
You were admitted for fainting and altered mental status.  This was likely a result of dehydration which has resolved with IV fluids.  You should have close follow-up with your PCP and neurologist.  Syncope Syncope is a medical term for fainting or passing out. This means you lose consciousness and drop to the ground. People are generally unconscious for less than 5 minutes. You may have some muscle twitches for up to 15 seconds before waking up and returning to normal. Syncope occurs more often in older adults, but it can happen to anyone. While most causes of syncope are not dangerous, syncope can be a sign of a serious medical problem. It is important to seek medical care.  CAUSES  Syncope is caused by a sudden drop in blood flow to the brain. The specific cause is often not determined. Factors that can bring on syncope include:  Taking medicines that lower blood pressure.  Sudden changes in posture, such as standing up quickly.  Taking more medicine than prescribed.  Standing in one place for too long.  Seizure disorders.  Dehydration and excessive exposure to heat.  Low blood sugar (hypoglycemia).  Straining to have a bowel movement.  Heart disease, irregular heartbeat, or other circulatory problems.  Fear, emotional distress, seeing blood, or severe pain. SYMPTOMS  Right before fainting, you may:  Feel dizzy or light-headed.  Feel nauseous.  See all white or all black in your field of vision.  Have cold, clammy skin. DIAGNOSIS  Your health care provider will ask about your symptoms, perform a physical exam, and perform an electrocardiogram (ECG) to record the electrical activity of your heart. Your health care provider may also perform other heart or blood tests to determine the cause of your syncope which may include:  Transthoracic echocardiogram (TTE). During echocardiography, sound waves are used to evaluate how blood flows through your heart.  Transesophageal  echocardiogram (TEE).  Cardiac monitoring. This allows your health care provider to monitor your heart rate and rhythm in real time.  Holter monitor. This is a portable device that records your heartbeat and can help diagnose heart arrhythmias. It allows your health care provider to track your heart activity for several days, if needed.  Stress tests by exercise or by giving medicine that makes the heart beat faster. TREATMENT  In most cases, no treatment is needed. Depending on the cause of your syncope, your health care provider may recommend changing or stopping some of your medicines. HOME CARE INSTRUCTIONS  Have someone stay with you until you feel stable.  Do not drive, use machinery, or play sports until your health care provider says it is okay.  Keep all follow-up appointments as directed by your health care provider.  Lie down right away if you start feeling like you might faint. Breathe deeply and steadily. Wait until all the symptoms have passed.  Drink enough fluids to keep your urine clear or pale yellow.  If you are taking blood pressure or heart medicine, get up slowly and take several minutes to sit and then stand. This can reduce dizziness. SEEK IMMEDIATE MEDICAL CARE IF:   You have a severe headache.  You have unusual pain in the chest, abdomen, or back.  You are bleeding from your mouth or rectum, or you have black or tarry stool.  You have an irregular or very fast heartbeat.  You have pain with breathing.  You have repeated fainting or seizure-like jerking during an episode.  You faint when sitting or  lying down.  You have confusion.  You have trouble walking.  You have severe weakness.  You have vision problems. If you fainted, call your local emergency services (911 in [place] Do not drive yourself to the hospital.  MAKE SURE YOU:  Understand these instructions.  Will watch your condition.  Will get help right away if you are not doing well  or get worse. Document Released: 09/21/2005 Document Revised: 09/26/2013 Document Reviewed: 11/20/2011 [facility] Patient Information 2015 [place], [place]. This information is not intended to replace advice given to you by your health care provider. Make sure you discuss any questions you have with your health care provider.

## 2015-03-13 NOTE — Progress Notes (Signed)
Utilization review complete. Mayte Diers RN CCM Case Mgmt phone [phone] 

## 2015-03-14 ENCOUNTER — Telehealth: Payer: Self-pay | Admitting: *Deleted

## 2015-03-14 LAB — URINE CULTURE
Colony Count: NO GROWTH
Culture: NO GROWTH

## 2015-03-14 NOTE — Telephone Encounter (Signed)
Appointments have been scheduled.

## 2015-03-14 NOTE — Telephone Encounter (Signed)
Left message for Wanda [name] to call back.  Need to schedule her mother a 30 minute follow up with Dr. Krista Blue sometime next week.  We are not able to do the office visit and infusion on the same day due to insurance constraints.  I will have [name] in [facility] also call to reschedule her Tysabri appt.

## 2015-03-14 NOTE — Telephone Encounter (Signed)
[name], pt's daughter called/returning [name]'s call. I relayed the information but she still insisted to speak with Sharyn Lull. Please call and advise. She can be reached @ [phone]

## 2015-03-16 LAB — CLONAZEPAM LEVEL: Clonazepam Lvl: 13 mcg/L — ABNORMAL LOW (ref 30–60)

## 2015-03-18 LAB — CULTURE, BLOOD (ROUTINE X 2)
CULTURE: NO GROWTH
CULTURE: NO GROWTH

## 2015-03-19 ENCOUNTER — Encounter (HOSPITAL_COMMUNITY): Payer: Federal, State, Local not specified - PPO

## 2015-03-20 ENCOUNTER — Encounter: Payer: Self-pay | Admitting: Neurology

## 2015-03-20 ENCOUNTER — Ambulatory Visit (INDEPENDENT_AMBULATORY_CARE_PROVIDER_SITE_OTHER): Payer: Federal, State, Local not specified - PPO | Admitting: Neurology

## 2015-03-20 VITALS — BP 108/70 | HR 77 | Ht 60.0 in | Wt 127.0 lb

## 2015-03-20 DIAGNOSIS — R0683 Snoring: Secondary | ICD-10-CM

## 2015-03-20 DIAGNOSIS — G35 Multiple sclerosis: Secondary | ICD-10-CM

## 2015-03-20 DIAGNOSIS — G35D Multiple sclerosis, unspecified: Secondary | ICD-10-CM

## 2015-03-20 DIAGNOSIS — R269 Unspecified abnormalities of gait and mobility: Secondary | ICD-10-CM

## 2015-03-20 DIAGNOSIS — G4700 Insomnia, unspecified: Secondary | ICD-10-CM

## 2015-03-20 DIAGNOSIS — G4710 Hypersomnia, unspecified: Secondary | ICD-10-CM | POA: Diagnosis not present

## 2015-03-20 NOTE — Progress Notes (Signed)
Chief Complaint  Patient presents with  . Multiple Sclerosis    Leeza is here with her daughter, [name].  Reports having increased weakness and gait difficulty last week.  She passed out on 03/12/15 and was taken to the ED.  Says she was admitted and treated for dehydration.  . Insomnia    She has been having difficulty falling asleep.  When she does sleep, her daughter says she snores at night and she hears pauses in her breathing.       PATIENT: Wanda [name] DOB: 1951-09-22  HISTORICAL   INITIAL VISIT In FEB 2015  SHALAUNDA WEATHERHOLTZ is accompanied by her daughter, [name], she was initially evaluated by Dr Rexene Alberts in December 2nd 2014, referred by her primary care Dr.Jeffrey  Ms. Deshazo is a 64 years old right-handed African American female, with past medical history of hypertension, hyperlipidemia, longtime smoker  She presents with acute onset of slurred speech, vertigo, since November 7th 2014, also has gait difficulty,  CAT scan of the brain without contrast in November 2014 showed moderate for age nonspecific cerebral white matter changes  MRI showed multiple scattered , discrete and confluent lesions at periventricular, periatrial, peri-callosal brainstem, cranial medullary junction and upper cervical spine white matter hyperintensities on T2/flair.  Enhancing lesions in the left paramedian pons. Left posterior frontal and left parietal and tiny right frontal periventricular white matter which likely represent active demarcating plaques. multiple T1 black holes noted in the periventricular and subcortical white matter. There is mild general cerebral atrophy.  MRI scan of the cervical spine showed spinal cord hyperintensities at C2 and C5 likely represent demyelinating plaques. Contrast images show actively enhancing lesion at C2.  Laboratory evaluation showed normal or negative NMO antibody, A1c 5. 9, ANA, CPK, X73, folic acid, vitamin B1, ESR, TSH, RPR, CMP, , CBC,  CSF study show  oligoclonal banding four,, normal meyelin basic protein. ACE was normal 8,   WBC 0, RBC 6, glucose 59, total protein 43,   JC virus titer was positive, 0.66, repeat titer 0.60 in 08/10/2014  She moved to [place] around 2010, In 2001, she had one episode of sudden right visual loss, reported extensive evaluation at that time, was diagnosed with right "eye problem"have received the by mouth steroid, intraocular steroid injection, per patient MRI of the brain was done at that time too.  Her right vision never came back to normal, it was blurry, she can only read large print with the right eye  Before this episode, she was highly functional, she retired as a Software engineer, lives independently, no gait difficulty, exercise regularly, driving,   After discuss with patient, and her daughter, she has active form of RRMS, we decided to proceed with [facility] IV infusion started since January 29 2014,  she understands the potential risks, including PML infection, but with her low JC virus  titer, she has a less than 0. 10/998 chance of getting PML in the first 24 months, we are monitoring her JC virus antibody titer every 3 months, repeating MRI of brain every to 6 months.  She also agrees to enroll in baclofen extended release trial, because she continues to have bilateral upper and lower extremity muscle spasticity.  UPDATE Oct 2nd 2015:  She was enrolled in [employer] baclofen extended release study since May 24 2014,, tolerating the medication well, on last visit in September 24th 2015, her baclofen dosage was increased from 20 mg, to 30 milligrams daily, she tolerated the medication  very well, reported continued improvement, less  bilaterally hands muscle spasm, less bilateral lower extremity muscle spasm, she is able to squeeze them and again, she fell a few days ago, has right lateral thigh area proves, achy pain,  UPDATE Nov 6th 2015: A week before her most recent  Tysarbri infusion in  October seventh 2015, she felt right arm, and leg weakness, increased gait difficulty, fatigue, getting worse since October seventh 2015, which has been persistent, she had quit driving about 2 weeks ago, no confident in her right leg controlling the gas paddles.  We have reviewed repeat MRI of the brain together,there was evidence of Moderate-severe periventricular and subcortical and pontine and upper cervical cord chronic demyelinating disease. Mild diffuse and severe corpus callosum atrophy.  No abnormal lesions are seen on post contrast views. Compared to MRI on 09/21/13 and 10/11/13, there is a new chronic plaque (55m) in the left basal ganglia (series 17, image 13). Also prior acute plaques no longer enhance.  This new lesions involving left basal ganglion, likely explaining for her acute worsening of right side weakness, could indicating a MS flareup,  UPDATE Nov 18th 2015:  She is here for some formal follow-up according to protocol, currently taking baclofen ER 40 mg every day without significant side effect, she was treated with Metro pack since last visit in August 10 2014, reported improvement of her right side weakness, but still not back to her baseline, could not drive, because of lack of control of right ankle movement  She also complains of excessive fatigue,  Anti-Tysarbri Antibody negative in Nov 2015  UPDATE Feb 24th 2016: She started taking provigil 100 mg twice a day, which has been helpful, she is tolerating baclofen ER 40 mg every day, which does help her right upper and lower extremity spasticity, she continue have gait difficulty, no longer driving, low titer JC virus positive antibody, 0.6, negative Tysarbri antibody, she did has 1 flareups of right-sided weakness in October 2015, 6 months after taking Tysarbri April 2015,  She continue complains of right upper lower extremity muscle spasticity, will increase her baclofen ER to 50 mg daily  UPDATE March 8th  2016: Higher dose of baclofen ER 50 mg every night, works better, she has less right arm and right leg spasticity, clonazepam 1 mg as needed works well for her insomnia, she hopes to be on higher dose of baclofen, for better control of her right side spasticity.  UPDATE March 20 2015: She was admitted to hospital in March 22 2015, she has developed constipation few days prior, take over-the-counter laxatives, had watery diarrhea in March 11 2015, early morning March 12 2015, while getting up using bathroom, she complains of lightheadedness, slumped over, transient loss of consciousness, mild confusion afterwards her daughter called ambulance, was noted to be hypotensive upon presentation, Blood pressure 88/54, pulse 58, temperature 94.5 F (34.7 C), resp. rate 13, weight 56.7 kg (125 lb), SpO2 100 %. With mild elevated BUN/creatinine, I have reviewed the repeat MRI of the brain in March 13 2015, stable MS lesions,no acute enhancing lesions.  I reviewed laboratory, normal CBC, BMP showed low potassium 3.4, mild elevated creatinine 1.08  Review of system: system review of systems performed and notable only for  gait difficulty, right side weakness  ALLERGIES: No Known Allergies  HOME MEDICATIONS: Outpatient Prescriptions Prior to Visit  Medication Sig Dispense Refill  . atorvastatin (LIPITOR) 20 MG tablet Take 1 tablet (20 mg total) by mouth daily.  90 tablet  3  . estradiol (ESTRACE) 0.5 MG tablet Take 1 tablet (0.5 mg total) by mouth daily.  90 tablet  3  . glucosamine-chondroitin 500-400 MG tablet Take 1 tablet by mouth 2 (two) times daily with a meal.      . hydrochlorothiazide (HYDRODIURIL) 25 MG tablet take 1 tablet by mouth once daily  90 tablet  1  . medroxyPROGESTERone (PROVERA) 2.5 MG tablet Take 1 tablet (2.5 mg total) by mouth daily.  90 tablet  4  . meloxicam (MOBIC) 15 MG tablet take 1 tablet by mouth once daily  90 tablet  1  . traZODone (DESYREL) 50 MG tablet TAKE 1/2 TO 1 TABLET AT  BEDTIME AS NEEDED FOR SLEEP.  30 tablet  2  . varenicline (CHANTIX) 1 MG tablet Take 1 tablet (1 mg total) by mouth 2 (two) times daily.  60 tablet  5  . zolpidem (AMBIEN) 10 MG tablet take 1 tablet by mouth at bedtime if needed  30 tablet  0     PAST MEDICAL HISTORY: Past Medical History  Diagnosis Date  . Elevated cholesterol   . Hypertension   . Arthritis   . Cervical dysplasia   . Depression   . Anxiety   . Macular degeneration of right eye 2001    PAST SURGICAL HISTORY: Past Surgical History  Procedure Laterality Date  . Colposcopy    . Knee surgery    . Cholecystectomy    . Tubal ligation      FAMILY HISTORY: Family History  Problem Relation Age of Onset  . Cancer Mother     Colon  . Diabetes Mother   . Hypertension Mother   . Arthritis Mother   . Cancer Father     prostate  . Kidney disease Brother     congenital single kidney  . Arthritis Sister   . Arthritis Sister   . Hematuria Son   . Gout Brother   . Multiple sclerosis Brother   . Arthritis Brother   . HIV Brother   . Cancer Brother     spinal    SOCIAL HISTORY:  History   Social History  . Marital Status: Widowed    Spouse Name: N/A    Number of Children: 2  . Years of Education: college   Occupational History  . retired     Social History Main Topics  . Smoking status: Current Some Day Smoker -- 1.00 packs/day for 45 years    Types: Cigarettes    Last Attempt to Quit: 01/21/2013  . Smokeless tobacco: Never Used  . Alcohol Use: 3.6 oz/week    6 Cans of beer per week  . Drug Use: No  . Sexual Activity: No   Other Topics Concern  . Not on file   Social History Narrative   Grew up in [place] area, 1 of 10 siblings - 24 still living, married for 33 years then divorced, moved to [place] to be near daughter post retirement, als has 1 son. 2 dogs.       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     PHYSICAL EXAM   PHYSICAL EXAMNIATION:  Gen: NAD,  conversant, well nourised, obese, well groomed                     Cardiovascular: Regular rate rhythm, no peripheral edema, warm, nontender. Eyes: Conjunctivae clear without exudates or hemorrhage Neck: Supple, no carotid bruise. Pulmonary: Clear to auscultation  bilaterally   NEUROLOGICAL EXAM:  MENTAL STATUS: Speech:    Speech is normal; fluent and spontaneous with normal comprehension.  Cognition:    The patient is oriented to person, place, and time;     recent and remote memory intact;     language fluent;     normal attention, concentration,     fund of knowledge.  CRANIAL NERVES: CN II: Visual fields are full to confrontation. Fundoscopic exam is normal with sharp discs and no vascular changes. Pupils are 4 mm and briskly reactive to light.  CN III, IV, VI: extraocular movement are normal. No ptosis. CN V: Facial sensation is intact to pinprick in all 3 divisions bilaterally. Corneal responses are intact.  CN VII: Face is symmetric with normal eye closure and smile. CN VIII: Hearing is normal to rubbing fingers CN IX, X: Palate elevates symmetrically. Phonation is normal. CN XI: Head turning and shoulder shrug are intact CN XII: Tongue is midline with normal movements and no atrophy.  MOTOR: There is no pronator drift of out-stretched arms. Muscle bulk and tone are normal. Muscle strength is normal.   Shoulder abduction Shoulder external rotation Elbow flexion Elbow extension Wrist flexion Wrist extension Finger abduction Hip flexion Knee flexion Knee extension Ankle dorsi flexion Ankle plantar flexion  R 4 4 4- _0 4- 4+  L _1 REFLEXES: Reflexes are 2+ and symmetric at the biceps, triceps, knees, and ankles. Plantar responses are flexor.  SENSORY: Light touch, pinprick, position sense, and vibration sense are intact in fingers and toes.  COORDINATION: Rapid alternating movements and fine finger movements are intact. There is no  dysmetria on finger-to-nose and heel-knee-shin. There are no abnormal or extraneous movements.   GAIT/STANCE: Unsteady, right hemi-circumferential gait, dragging her right leg, Romberg is absent.  DIAGNOSTIC DATA (LABS, IMAGING, TESTING) - I reviewed patient records, labs, notes, testing and imaging myself where available.  Lab Results  Component Value Date   WBC 7.6 03/13/2015   HGB 12.3 03/13/2015   HCT 34.1* 03/13/2015   MCV 81.2 03/13/2015   PLT 153 03/13/2015      Component Value Date/Time   NA 143 03/13/2015 0536   NA 136 08/10/2014 1304   K 3.4* 03/13/2015 0536   CL 112* 03/13/2015 0536   CO2 21* 03/13/2015 0536   GLUCOSE 83 03/13/2015 0536   GLUCOSE 90 08/10/2014 1304   BUN 17 03/13/2015 0536   BUN 15 08/10/2014 1304   CREATININE 1.08* 03/13/2015 0536   CREATININE 0.89 06/29/2013 1501   CALCIUM 8.2* 03/13/2015 0536   PROT 6.1* 03/12/2015 0835   PROT 7.2 08/10/2014 1304   ALBUMIN 3.2* 03/12/2015 0835   AST 29 03/12/2015 0835   ALT 28 03/12/2015 0835   ALKPHOS 59 03/12/2015 0835   BILITOT 0.3 03/12/2015 0835   GFRNONAA 53* 03/13/2015 0536   GFRAA >60 03/13/2015 0536   Lab Results  Component Value Date   CHOL 99 03/12/2015   HDL 45 03/12/2015   LDLCALC 46 03/12/2015   TRIG 38 03/12/2015   CHOLHDL 2.2 03/12/2015   Lab Results  Component Value Date   HGBA1C 6.0* 03/12/2015   Lab Results  Component Value Date   VITAMINB12 >7500* 03/12/2015   Lab Results  Component Value Date   TSH 0.198* 03/12/2015    ASSESSMENT AND PLAN   64 years old [place] American female, with  relapsing  meeting multiple sclerosis, presented with previous optic neuritis, gait difficulty since 2014, worsening right-sided weakness since September 2015, while taking Tysarbri IV infusion,  Repeat MRI of the brain showed new lesion at left basal ganglia/internal capsule region, in the setting of extensive lesions involving brain, and cervical.  She has been treated with Tysarbri  infusion since January 29 2014, tolerating medication well, She is JC virus positive, with titer of  0.6 in November 2015,   She in o  baclofen ER 60 mg daily, which does help her lower extremity spasticity.  Keep provigil 100 mg twice a day Clonazepam 1 mg as needed for insomnia,  Marcial Pacas, M.D. Ph.D.  [facility] [address], [place], [place] [address] [phone]

## 2015-04-05 ENCOUNTER — Institutional Professional Consult (permissible substitution): Payer: Federal, State, Local not specified - PPO | Admitting: Neurology

## 2015-04-05 ENCOUNTER — Telehealth: Payer: Self-pay

## 2015-04-05 ENCOUNTER — Other Ambulatory Visit: Payer: Self-pay | Admitting: Women's Health

## 2015-04-05 NOTE — Telephone Encounter (Signed)
Patient did not show to appt today  

## 2015-04-10 ENCOUNTER — Telehealth: Payer: Self-pay | Admitting: Neurology

## 2015-04-10 NOTE — Telephone Encounter (Signed)
It appears a 6 month Rx was sent to [facility] on 05/24.  I called the pharmacy.  They verified they do have this Rx on file.  Says they had to order the medication, because it was out of stock, and it should be in tomorrow.  I called the patient back to advise.  She is aware and will follow up with the pharmacy tomorrow.

## 2015-04-10 NOTE — Telephone Encounter (Signed)
Patient called and requested a refill on Rx. modafinil (PROVIGIL) 200 MG tablet be sent to [place], [place]. Please call and advise.

## 2015-04-11 ENCOUNTER — Encounter: Payer: Self-pay | Admitting: Neurology

## 2015-04-11 NOTE — Telephone Encounter (Signed)
Patient called back and stated that [facility] told her they need authorization for the medication. Please call and advise.

## 2015-04-11 NOTE — Telephone Encounter (Signed)
I called the pharmacy, and spoke with pharmacist who said the Rx is going through without problems.  He said they were filling the wrong Rx, but have corrected the issue.  I called the patient back to advise.  Got no answer.  Left message.

## 2015-04-15 ENCOUNTER — Other Ambulatory Visit: Payer: Self-pay | Admitting: Neurology

## 2015-04-16 ENCOUNTER — Encounter (HOSPITAL_COMMUNITY): Payer: Federal, State, Local not specified - PPO

## 2015-04-16 NOTE — Telephone Encounter (Signed)
Patient has appt in August.  

## 2015-04-17 ENCOUNTER — Telehealth: Payer: Self-pay | Admitting: *Deleted

## 2015-04-17 NOTE — Telephone Encounter (Signed)
RX for clonazepam faxed and confirmed to [facility] on [address]

## 2015-04-19 ENCOUNTER — Other Ambulatory Visit: Payer: Self-pay | Admitting: Women's Health

## 2015-05-02 ENCOUNTER — Ambulatory Visit: Payer: Federal, State, Local not specified - PPO | Attending: Neurology | Admitting: Physical Therapy

## 2015-05-02 ENCOUNTER — Encounter: Payer: Self-pay | Admitting: Physical Therapy

## 2015-05-02 DIAGNOSIS — M25673 Stiffness of unspecified ankle, not elsewhere classified: Secondary | ICD-10-CM

## 2015-05-02 DIAGNOSIS — R531 Weakness: Secondary | ICD-10-CM | POA: Diagnosis present

## 2015-05-02 DIAGNOSIS — R296 Repeated falls: Secondary | ICD-10-CM | POA: Insufficient documentation

## 2015-05-02 DIAGNOSIS — R29898 Other symptoms and signs involving the musculoskeletal system: Secondary | ICD-10-CM | POA: Diagnosis present

## 2015-05-02 DIAGNOSIS — R269 Unspecified abnormalities of gait and mobility: Secondary | ICD-10-CM | POA: Diagnosis not present

## 2015-05-02 DIAGNOSIS — R2681 Unsteadiness on feet: Secondary | ICD-10-CM | POA: Insufficient documentation

## 2015-05-03 ENCOUNTER — Ambulatory Visit: Payer: Federal, State, Local not specified - PPO | Admitting: Physical Therapy

## 2015-05-03 NOTE — Therapy (Addendum)
[facility] [address] [place], [place], [address] Phone: [phone]   Fax:  [phone]  Physical Therapy Evaluation  Patient Details  Name: Wanda [name] MRN: [id number] Date of Birth: 1951-06-21 Referring Provider:  Marcial Pacas, MD  Encounter Date: 05/02/2015      PT End of Session - 05/02/15 0930    Visit Number 1   Number of Visits 17   Date for PT Re-Evaluation 06/28/15   PT Start Time 0930   PT Stop Time 1015   PT Time Calculation (min) 45 min   Equipment Utilized During Treatment Gait belt   Activity Tolerance Patient tolerated treatment well   Behavior During Therapy [facility] for tasks assessed/performed      Past Medical History  Diagnosis Date  . Elevated cholesterol   . Hypertension   . Arthritis   . Cervical dysplasia   . Depression   . Anxiety   . Macular degeneration of right eye 2001  . MS (multiple sclerosis) 08/2013    Past Surgical History  Procedure Laterality Date  . Colposcopy    . Knee surgery    . Cholecystectomy    . Tubal ligation    . Cortisone injections      Bil knees    There were no vitals filed for this visit.  Visit Diagnosis:  Abnormality of gait - Plan: PT plan of care cert/re-cert  Unsteadiness - Plan: PT plan of care cert/re-cert  Weakness generalized - Plan: PT plan of care cert/re-cert  Falls frequently - Plan: PT plan of care cert/re-cert  Decreased range of motion of ankle - Plan: PT plan of care cert/re-cert      Subjective Assessment - 05/02/15 0942    Subjective This 64yo female was diagnosed with MS in 2014. She had an episode of altered mental status 03/12/2015 with hospitalization 1 day. She was referred for PT evaluation.   Patient Stated Goals Get right stronger, walk better.   Currently in Pain? No/denies            [facility] PT Assessment - 05/02/15 0930    Assessment   Medical Diagnosis MS   Onset Date/Surgical Date 03/12/15   hospitalization   Precautions   Precautions Fall   Restrictions   Weight Bearing Restrictions No   Balance Screen   Has the patient fallen in the past 6 months Yes   How many times? balance losses into walls & furniture daily   Has the patient had a decrease in activity level because of a fear of falling?  No   Is the patient reluctant to leave their home because of a fear of falling?  No   Home Environment   Living Environment Private residence   Living Arrangements Alone  dtr lives 15 minute drive away, 2 dogs small   Type of Home Apartment   Home Access Other (comment)  curb from parking lot, ~50' from parking lot.   Home Layout One level  first floor   [place] - single point   Prior Function   Level of Independence Independent;Independent with household mobility without device;Independent with community mobility with device;Independent with gait;Independent with transfers   Cognition   Overall Cognitive Status History of cognitive impairments - at baseline   Posture/Postural Control   Posture/Postural Control Postural limitations   Postural Limitations Rounded Shoulders;Forward head;Weight shift left;Flexed trunk   Posture Comments --   ROM / Strength   AROM / PROM / Strength  Strength;PROM   AROM   Right Ankle Dorsiflexion -5   PROM   Right/Left Ankle Right   Strength   Strength Assessment Site Hip;Knee;Ankle;Lumbar   Right/Left Hip Right   Right Hip Flexion 2+/5   Right Hip Extension 2+/5   Right Hip ABduction 3/5   Right Hip ADduction 2+/5   Right/Left Knee Right   Right Knee Flexion 2+/5   Right Knee Extension 3-/5   Right/Left Ankle Right   Right Ankle Dorsiflexion 2+/5   Lumbar Flexion 2+/5   Lumbar Extension 2+/5   Transfers   Transfers Sit to Stand;Stand to Sit   Sit to Stand 5: Supervision;With upper extremity assist;With armrests;From chair/3-in-1  uses back of legs against chair to stabilize   Stand to Sit 5: Supervision;With upper  extremity assist;With armrests;To chair/3-in-1  uses back of legs against chair to control descent   Ambulation/Gait   Ambulation/Gait Yes   Ambulation/Gait Assistance 5: Supervision;4: Min assist  SBA with cane but unsafe, MinA without device   Ambulation/Gait Assistance Details --   Ambulation Distance (Feet) 100 Feet   Assistive device Straight cane   Gait Pattern Step-to pattern;Decreased arm swing - right;Decreased arm swing - left;Decreased step length - left;Decreased stride length;Decreased stance time - right;Decreased hip/knee flexion - right;Decreased dorsiflexion - right;Trunk flexed;Wide base of support;Poor foot clearance - right;Decreased weight shift to right;Right flexed knee in stance;Lateral trunk lean to left   Ambulation Surface Indoor;Level   Gait velocity 2.44 ft/sec with cane, 2.12ft/sec without device   Berg Balance Test   Sit to Stand Needs minimal aid to stand or to stabilize   Standing Unsupported Able to stand 2 minutes with supervision   Sitting with Back Unsupported but Feet Supported on Floor or Stool Able to sit safely and securely 2 minutes   Stand to Sit Uses backs of legs against chair to control descent   Transfers Able to transfer safely, definite need of hands   Standing Unsupported with Eyes Closed Able to stand 3 seconds   Standing Ubsupported with Feet Together Needs help to attain position but able to stand for 30 seconds with feet together   From Standing, Reach Forward with Outstretched Arm Reaches forward but needs supervision   From Standing Position, Pick up Object from Floor Able to pick up shoe, needs supervision   From Standing Position, Turn to Look Behind Over each Shoulder Needs supervision when turning   Turn 360 Degrees Needs close supervision or verbal cueing   Standing Unsupported, Alternately Place Feet on Step/Stool Able to complete >2 steps/needs minimal assist   Standing Unsupported, One Foot in Front Needs help to step but can  hold 15 seconds   Standing on One Leg Tries to lift leg/unable to hold 3 seconds but remains standing independently   Total Score 25   Timed Up and Go Test   Normal TUG (seconds) 16.21  16.21sec with cane, 20.08sec without device                             PT Short Term Goals - 05/02/15 0930    PT SHORT TERM GOAL #1   Title Patient demonstrates, verbalizes understanding of initial HEP. (Target Date: 05/31/2015)   Time 4   Period Weeks   Status New   PT SHORT TERM GOAL #2   Title Patient verbalizes understanding of fall prevention strategies  (Target Date: 05/31/2015)   Time 4   Period Weeks  Status New   PT SHORT TERM GOAL #3   Title Patient ambulates 17' with LRAD with supervision.  (Target Date: 05/31/2015)   Time 4   Period Weeks   Status New   PT SHORT TERM GOAL #4   Title Patient negotiates ramps & curbs with LRAD with supervision.  (Target Date: 05/31/2015)   Time 4   Period Weeks   Status New           PT Long Term Goals - May 09, 2015 0930    PT LONG TERM GOAL #1   Title Patient verbalizes understanding of ongoing HEP / fitness plan.   (Target Date: 06/28/2015)   Time 8   Period Weeks   Status New   PT LONG TERM GOAL #2   Title Timed Up & Go safely <13.5sec  (Target Date: 06/28/2015)   Time 8   Period Weeks   Status New   PT LONG TERM GOAL #3   Title Berg Balance >/= 45/56  (Target Date: 06/28/2015)   Time 8   Period Weeks   Status New   PT LONG TERM GOAL #4   Title Patient ambulates around furniture similar to home situation without device modified independent.  (Target Date: 06/28/2015)   Time 8   Period Weeks   Status New   PT LONG TERM GOAL #5   Title Patient ambulates 250' & negotiates ramps, curbs with LRAD modified independent.  (Target Date: 06/28/2015)   Time 8   Period Weeks   Status New               Plan - 05-09-2015 0930    Clinical Impression Statement This 64yo female was diagnosed with MS in 2014. She was  recently hospitalized 03/12/2015 overnight with altered mental status. She reports increased weakness and balance / gait issues since recent decline. She sits, stands and ambulates with right ankle plantarflexion which negatively impacts balance & gait. She has decreased right ankle dorsiflexion ROM. PT tests indicate high risk of falls including TImed Up & Go 16.21sec with cane & 20.08sec without device with minA ( >13.5 sec indicates fall risk) and Berg Balance 25/56 (<45/56 indicates fall risk).          Pt will benefit from skilled therapeutic intervention in order to improve on the following deficits Abnormal gait;Decreased activity tolerance;Decreased balance;Decreased endurance;Decreased knowledge of precautions;Decreased knowledge of use of DME;Decreased mobility;Decreased range of motion;Decreased strength   Rehab Potential Good   PT Frequency 2x / week   PT Duration 8 weeks   PT Treatment/Interventions ADLs/Self Care Home Management;DME Instruction;Gait training;Stair training;Functional mobility training;Therapeutic activities;Therapeutic exercise;Balance training;Neuromuscular re-education;Patient/family education;Orthotic Fit/Training;Manual techniques;Vestibular   PT Next Visit Plan instruct in fall prevention strategies, HEP, sensory organization test   PT Home Exercise Plan HEP strength for right LE, ankle ROM, balance   Consulted and Agree with Plan of Care Patient          G-Codes - May 09, 2015 0930    Functional Assessment Tool Used Merrilee Jansky Balance 25/56   Functional Limitation Other PT primary   Other PT Primary Current Status (Z5638) At least 60 percent but less than 80 percent impaired, limited or restricted   Other PT Primary Goal Status (V5643) At least 20 percent but less than 40 percent impaired, limited or restricted       Problem List Patient Active Problem List   Diagnosis Date Noted  . Dehydration   . Hypokalemia   . Altered level of consciousness 03/12/2015  .  Acute encephalopathy 03/12/2015  . Gait difficulty 08/10/2014  . Multiple sclerosis 10/25/2013  . Insomnia 03/23/2013  . Fibroids 12/29/2012  . Elevated cholesterol   . Hypertension   . Arthritis   . Cervical dysplasia     Slevin Gunby PT, DPT 05/03/2015, 11:05 AM  [facility] [address] [place], [place], [address] Phone: [phone]   Fax:  [phone]

## 2015-05-07 ENCOUNTER — Ambulatory Visit: Payer: Federal, State, Local not specified - PPO | Attending: Neurology | Admitting: Physical Therapy

## 2015-05-07 ENCOUNTER — Encounter: Payer: Self-pay | Admitting: Physical Therapy

## 2015-05-07 DIAGNOSIS — R531 Weakness: Secondary | ICD-10-CM | POA: Insufficient documentation

## 2015-05-07 DIAGNOSIS — R296 Repeated falls: Secondary | ICD-10-CM | POA: Diagnosis present

## 2015-05-07 DIAGNOSIS — R269 Unspecified abnormalities of gait and mobility: Secondary | ICD-10-CM | POA: Diagnosis not present

## 2015-05-07 DIAGNOSIS — R2681 Unsteadiness on feet: Secondary | ICD-10-CM | POA: Diagnosis present

## 2015-05-07 NOTE — Patient Instructions (Signed)

## 2015-05-08 NOTE — Therapy (Addendum)
[facility] [address] [place], [place], [address] Phone: [phone]   Fax:  [phone]  Physical Therapy Treatment  Patient Details  Name: Wanda [name] MRN: [id number] Date of Birth: 1951/08/21 Referring Provider:  Harrison Mons, PA-C  Encounter Date: 05/07/2015      PT End of Session - 05/07/15 0939    Visit Number 2   Number of Visits 17   Date for PT Re-Evaluation 06/28/15   PT Start Time 0933   PT Stop Time 1015   PT Time Calculation (min) 42 min   Equipment Utilized During Treatment Gait belt   Activity Tolerance Patient tolerated treatment well   Behavior During Therapy [facility] for tasks assessed/performed      Past Medical History  Diagnosis Date  . Elevated cholesterol   . Hypertension   . Arthritis   . Cervical dysplasia   . Depression   . Anxiety   . Macular degeneration of right eye 2001  . MS (multiple sclerosis) 08/2013    Past Surgical History  Procedure Laterality Date  . Colposcopy    . Knee surgery    . Cholecystectomy    . Tubal ligation    . Cortisone injections      Bil knees    There were no vitals filed for this visit.  Visit Diagnosis:  Abnormality of gait  Unsteadiness  Weakness generalized  Falls frequently      Subjective Assessment - 05/07/15 0938    Subjective No new complaints. No falls or pain to report. Does report that her right side is feeling weaker today and stumbling multiple times.   Currently in Pain? No/denies     Treatment:  Self care: discussed fall prevention strategies for home. Written information provided as well.  Neuro re-ed: sensory organization test performed with following results Conditions: Condition 1: below norm, above norm, below norm Condition 2: below norm, above norm, below norm Condition 3: fall, below norm, below norm Condition 4: all 3 below norm Condition 5: all 3 falls Condition 6: all 3 falls  Composite score:  31  Sensory analysis Som: above normal Vis: below norm Vest: barley on chart, way below norm Pref: below norm  Strategy analysis: Shows ankle dominant  COG alignment: Left preference           PT Education - 05/07/15 1616    Education provided Yes   Education Details Fall prevention strategies   Person(s) Educated Patient   Methods Explanation;Demonstration;Handout   Comprehension Verbalized understanding          PT Short Term Goals - 05/02/15 0930    PT SHORT TERM GOAL #1   Title Patient demonstrates, verbalizes understanding of initial HEP. (Target Date: 05/31/2015)   Time 4   Period Weeks   Status New   PT SHORT TERM GOAL #2   Title Patient verbalizes understanding of fall prevention strategies  (Target Date: 05/31/2015)   Time 4   Period Weeks   Status New   PT SHORT TERM GOAL #3   Title Patient ambulates 300' with LRAD with supervision.  (Target Date: 05/31/2015)   Time 4   Period Weeks   Status New   PT SHORT TERM GOAL #4   Title Patient negotiates ramps & curbs with LRAD with supervision.  (Target Date: 05/31/2015)   Time 4   Period Weeks   Status New           PT Long Term Goals - 05/02/15  0930    PT LONG TERM GOAL #1   Title Patient verbalizes understanding of ongoing HEP / fitness plan.   (Target Date: 06/28/2015)   Time 8   Period Weeks   Status New   PT LONG TERM GOAL #2   Title Timed Up & Go safely <13.5sec  (Target Date: 06/28/2015)   Time 8   Period Weeks   Status New   PT LONG TERM GOAL #3   Title Berg Balance >/= 45/56  (Target Date: 06/28/2015)   Time 8   Period Weeks   Status New   PT LONG TERM GOAL #4   Title Patient ambulates around furniture similar to home situation without device modified independent.  (Target Date: 06/28/2015)   Time 8   Period Weeks   Status New   PT LONG TERM GOAL #5   Title Patient ambulates 250' & negotiates ramps, curbs with LRAD modified independent.  (Target Date: 06/28/2015)   Time 8   Period  Weeks   Status New           Plan - 05/07/15 0939    Clinical Impression Statement Sensory Organization test completed (see note for results). Plan to issue HEP next visit based on results of this test with emphasis on stimulating pt's vestibular system. Pt also edcuated on and given copy of fall prevention strategies for home. Making steady progress toward goals.   Pt will benefit from skilled therapeutic intervention in order to improve on the following deficits Abnormal gait;Decreased activity tolerance;Decreased balance;Decreased endurance;Decreased knowledge of precautions;Decreased knowledge of use of DME;Decreased mobility;Decreased range of motion;Decreased strength   Rehab Potential Good   PT Frequency 2x / week   PT Duration 8 weeks   PT Treatment/Interventions ADLs/Self Care Home Management;DME Instruction;Gait training;Stair training;Functional mobility training;Therapeutic activities;Therapeutic exercise;Balance training;Neuromuscular re-education;Patient/family education;Orthotic Fit/Training;Manual techniques;Vestibular   PT Next Visit Plan HEP with emphasis on stimulation vestibular system, for right LE strengthening and for ankle ROM;balance training   PT Home Exercise Plan --   Consulted and Agree with Plan of Care Patient        Problem List Patient Active Problem List   Diagnosis Date Noted  . Dehydration   . Hypokalemia   . Altered level of consciousness 03/12/2015  . Acute encephalopathy 03/12/2015  . Gait difficulty 08/10/2014  . Multiple sclerosis 10/25/2013  . Insomnia 03/23/2013  . Fibroids 12/29/2012  . Elevated cholesterol   . Hypertension   . Arthritis   . Cervical dysplasia     Willow Ora 05/08/2015, 8:34 AM  Willow Ora, PTA, [facility] [address], [place], [place] [address] [phone] 05/08/2015, 8:34 AM

## 2015-05-09 ENCOUNTER — Ambulatory Visit: Payer: Federal, State, Local not specified - PPO

## 2015-05-09 DIAGNOSIS — R2681 Unsteadiness on feet: Secondary | ICD-10-CM

## 2015-05-09 DIAGNOSIS — R269 Unspecified abnormalities of gait and mobility: Secondary | ICD-10-CM | POA: Diagnosis not present

## 2015-05-09 NOTE — Therapy (Signed)
[facility] [address] [place], [place], [address] Phone: [phone]   Fax:  [phone]  Physical Therapy Treatment  Patient Details  Name: Wanda [name] MRN: [id number] Date of Birth: 1950-11-24 Referring Provider:  Harrison Mons, PA-C  Encounter Date: 05/09/2015      PT End of Session - 05/09/15 1101    Visit Number 3   Number of Visits 17   Date for PT Re-Evaluation 06/28/15   PT Start Time 1016   PT Stop Time 1057   PT Time Calculation (min) 41 min   Equipment Utilized During Treatment Gait belt   Activity Tolerance Patient tolerated treatment well   Behavior During Therapy [facility] for tasks assessed/performed      Past Medical History  Diagnosis Date  . Elevated cholesterol   . Hypertension   . Arthritis   . Cervical dysplasia   . Depression   . Anxiety   . Macular degeneration of right eye 2001  . MS (multiple sclerosis) 08/2013    Past Surgical History  Procedure Laterality Date  . Colposcopy    . Knee surgery    . Cholecystectomy    . Tubal ligation    . Cortisone injections      Bil knees    There were no vitals filed for this visit.  Visit Diagnosis:  Abnormality of gait  Unsteadiness      Subjective Assessment - 05/09/15 1019    Subjective Pt reported she fell out of her bed this morning when getting up to go to the bathroom and she continues to experience LOB (stumbles) when amb. Pt was able to get back into the bed. Pt denied injuries. Pt experienced a LOB during amb. when walking back to gym and required min A to maintain balance and HHA with SPC.   Pt reported R eye vision is coming back.   Patient Stated Goals Get right stronger, walk better.   Currently in Pain? No/denies        Neuro re-ed: Pt performed balance exercises (new HEP) in corner with chair in front of her for safety. Pt required supervision to min guard to ensure safety. Pt required cues for technique and to  keep  Move head only vs. Trunk/shoulders during head turns and to decrease trunk flexion during ant/post weight shifting. Pt performed all balance exercises on compliant and non-compliant surfaces and PT added appropriate exercises to HEP, please see pt instructions for details.                         PT Education - 05/09/15 1101    Education provided Yes   Education Details Balance HEP. PT reiterated the importance of using SPC at all times, as pt used SPC intermittently when ambulating back to the gym and experienced LOB.   Person(s) Educated Patient   Methods Explanation;Demonstration;Verbal cues;Handout   Comprehension Returned demonstration;Verbalized understanding          PT Short Term Goals - 05/09/15 1104    PT SHORT TERM GOAL #1   Title Patient demonstrates, verbalizes understanding of initial HEP. (Target Date: 05/31/2015)   Time 4   Period Weeks   Status On-going   PT SHORT TERM GOAL #2   Title Patient verbalizes understanding of fall prevention strategies  (Target Date: 05/31/2015)   Time 4   Period Weeks   Status On-going   PT SHORT TERM GOAL #3   Title Patient ambulates 300'  with LRAD with supervision.  (Target Date: 05/31/2015)   Time 4   Period Weeks   Status On-going   PT SHORT TERM GOAL #4   Title Patient negotiates ramps & curbs with LRAD with supervision.  (Target Date: 05/31/2015)   Time 4   Period Weeks   Status On-going           PT Long Term Goals - 05/09/15 1104    PT LONG TERM GOAL #1   Title Patient verbalizes understanding of ongoing HEP / fitness plan.   (Target Date: 06/28/2015)   Time 8   Period Weeks   Status On-going   PT LONG TERM GOAL #2   Title Timed Up & Go safely <13.5sec  (Target Date: 06/28/2015)   Time 8   Period Weeks   Status On-going   PT LONG TERM GOAL #3   Title Berg Balance >/= 45/56  (Target Date: 06/28/2015)   Time 8   Period Weeks   Status On-going   PT LONG TERM GOAL #4   Title Patient  ambulates around furniture similar to home situation without device modified independent.  (Target Date: 06/28/2015)   Time 8   Period Weeks   Status On-going   PT LONG TERM GOAL #5   Title Patient ambulates 250' & negotiates ramps, curbs with LRAD modified independent.  (Target Date: 06/28/2015)   Time 8   Period Weeks   Status On-going               Plan - 05/09/15 1102    Clinical Impression Statement Pt experienced increased postural sway and LOB during activities with eyes closed and while standing on compliant surfaces, which indicates hypofunction of vestibular system. Pt would continue to benefit from skilled PT to improve safety during functional mobility.   Pt will benefit from skilled therapeutic intervention in order to improve on the following deficits Abnormal gait;Decreased activity tolerance;Decreased balance;Decreased endurance;Decreased knowledge of precautions;Decreased knowledge of use of DME;Decreased mobility;Decreased range of motion;Decreased strength   Rehab Potential Good   PT Frequency 2x / week   PT Duration 8 weeks   PT Treatment/Interventions ADLs/Self Care Home Management;DME Instruction;Gait training;Stair training;Functional mobility training;Therapeutic activities;Therapeutic exercise;Balance training;Neuromuscular re-education;Patient/family education;Orthotic Fit/Training;Manual techniques;Vestibular   PT Next Visit Plan Initiate R LE strengthening HEP   PT Home Exercise Plan Balance HEP   Consulted and Agree with Plan of Care Patient        Problem List Patient Active Problem List   Diagnosis Date Noted  . Dehydration   . Hypokalemia   . Altered level of consciousness 03/12/2015  . Acute encephalopathy 03/12/2015  . Gait difficulty 08/10/2014  . Multiple sclerosis 10/25/2013  . Insomnia 03/23/2013  . Fibroids 12/29/2012  . Elevated cholesterol   . Hypertension   . Arthritis   . Cervical dysplasia     Jabarie Pop L 05/09/2015,  11:11 AM  [facility] [address] [place], [place], [address] Phone: [phone]   Fax:  [phone]     Geoffry Paradise, PT,DPT 05/09/2015 11:11 AM Phone: [phone] Fax: [phone]

## 2015-05-09 NOTE — Patient Instructions (Signed)
Perform in a corner with a chair in front of you or at the kitchen sink with a chair behind you for safety:  Copyright  VHI. All rights reserved.  Feet Together (Compliant Surface) Varied Arm Positions - Eyes Closed   Stand on compliant surface: _pillow/cushion_______ with feet together and arms at your side. Close eyes and visualize upright position. Hold_15-30___ seconds. Repeat __3__ times per session. Do __1__ sessions per day.  Copyright  VHI. All rights reserved.  Feet Together (Compliant Surface) Head Motion - Eyes Open   With eyes open, standing on compliant surface: _pillow/cushion_______, feet together, move head slowly: up and down 10 times and side to side 10 times. Repeat __3__ times per session. Do __1__ sessions per day.  Copyright  VHI. All rights reserved.  Feet Partial Heel-Toe, Varied Arm Positions - Eyes Open   With eyes open, right foot partially in front of the other, arms at your side, look straight ahead at a stationary object. Hold __30__ seconds. Repeat with left foot in front. Repeat __3__ times per session. Do __1__ sessions per day.  Copyright  VHI. All rights reserved.  Single Leg - Eyes Open   Holding support, lift right leg while maintaining balance over other leg. Progress to removing hands from support surface for longer periods of time. Repeat with left leg lifted. Hold__10__ seconds. Repeat __3__ times per session. Do __1__ sessions per day.  Copyright  VHI. All rights reserved.  Weight Shift: Anterior / Posterior (Limits of Stability)   Slowly shift weight backward until toes begin to rise off floor. Return to starting position. Shift weight slowly forward until heels begin to rise off floor. Hold each position __2__ seconds. Repeat __20__ times per session. Do __1__ sessions per day.  Copyright  VHI. All rights reserved.

## 2015-05-13 ENCOUNTER — Encounter: Payer: Self-pay | Admitting: Physical Therapy

## 2015-05-13 ENCOUNTER — Ambulatory Visit: Payer: Federal, State, Local not specified - PPO | Admitting: Physical Therapy

## 2015-05-13 DIAGNOSIS — R2681 Unsteadiness on feet: Secondary | ICD-10-CM

## 2015-05-13 DIAGNOSIS — R269 Unspecified abnormalities of gait and mobility: Secondary | ICD-10-CM | POA: Diagnosis not present

## 2015-05-13 DIAGNOSIS — R296 Repeated falls: Secondary | ICD-10-CM

## 2015-05-13 DIAGNOSIS — R531 Weakness: Secondary | ICD-10-CM

## 2015-05-13 NOTE — Patient Instructions (Signed)
Fitness Plan has 4 components.  1. Endurance - Recommend machines that are sitting with back support that uses both your arms and your legs.Or can do walking program Goal is 20-30 minutes. 2. Strength - weight machines enough weight to have resistance but not so much that you have to strain or cheat.  Goal is to do 15 repetitions for 2-3 sets. Rest 30-60 seconds between sets.  Do 2-4 leg machines, 2-4 arm machines & 2-4 trunk machines. Look for pictures to make sure you exercise both sides of arm, leg or trunk.  Leg machines like leg press (can do both legs or each leg by themselves),   At home can do theraband exercises for arms or legs, floor transfers, sit to stand to sit using arms as little as possible, Yoga positions 3.Flexibility - make sure you include arms, legs & trunk. Can do Yoga also 4. Balance- can work in corner with chair in front - head turns, arm motions, eyes closed; place one foot inside cabinet or on 4-6" block to work on one-legged stance,   Try to get to each component 3-5 times per week.  Going to [employer] or fitness center you want do things you can not do at home.  For example use bikes at [employer] if you don't have one at home. Then on days you don't go to [employer], do exercises at home. 

## 2015-05-13 NOTE — Therapy (Signed)
[facility] [address] [place], [place], [address] Phone: [phone]   Fax:  [phone]  Physical Therapy Treatment  Patient Details  Name: Wanda [name] MRN: [id number] Date of Birth: [birth year] Referring Provider:  Harrison Mons, PA-C  Encounter Date: 05/13/2015      PT End of Session - 05/13/15 1015    Visit Number 4   Number of Visits 17   Date for PT Re-Evaluation 06/28/15   PT Start Time 4854   PT Stop Time 1058   PT Time Calculation (min) 43 min   Equipment Utilized During Treatment Gait belt   Activity Tolerance Patient tolerated treatment well   Behavior During Therapy [facility] for tasks assessed/performed      Past Medical History  Diagnosis Date  . Elevated cholesterol   . Hypertension   . Arthritis   . Cervical dysplasia   . Depression   . Anxiety   . Macular degeneration of right eye 2001  . MS (multiple sclerosis) 08/2013    Past Surgical History  Procedure Laterality Date  . Colposcopy    . Knee surgery    . Cholecystectomy    . Tubal ligation    . Cortisone injections      Bil knees    There were no vitals filed for this visit.  Visit Diagnosis:  Unsteadiness  Weakness generalized  Falls frequently      Subjective Assessment - 05/13/15 1024    Subjective She fell walking dogs when took new path that was slanted. No injuries.    Currently in Pain? No/denies      Neuromuscular Re-education: 1. Standing on foam with eyes closed feet together. Place arms in various positions and try to hold up to 30 seconds. 2. Standing on foam with eyes open feet together. Head movements right/left, up/down, up-right/down-left, up-left/down-right 3.Standing on floor eyes open modified tandem dynamic movement of UEs 4.Standing on one leg then modified with alternate placing one leg on 4" yoga block and holding up to 30 seconds. 5.Limits of stability weight shifts at pelvis with  counterbalance of upper body/UEs: anterior/posterior and laterally.                           PT Education - 05/13/15 1015    Education provided Yes   Education Details Reviewed results of Sensory Organization Test with functional implacations, Review HEP initiated last session with PT cueing to increase difficulty   Person(s) Educated Patient   Methods Explanation;Demonstration;Tactile cues;Verbal cues;Handout   Comprehension Verbalized understanding;Returned demonstration;Verbal cues required;Tactile cues required;Need further instruction          PT Short Term Goals - 05/09/15 1104    PT SHORT TERM GOAL #1   Title Patient demonstrates, verbalizes understanding of initial HEP. (Target Date: 05/31/2015)   Time 4   Period Weeks   Status On-going   PT SHORT TERM GOAL #2   Title Patient verbalizes understanding of fall prevention strategies  (Target Date: 05/31/2015)   Time 4   Period Weeks   Status On-going   PT SHORT TERM GOAL #3   Title Patient ambulates 300' with LRAD with supervision.  (Target Date: 05/31/2015)   Time 4   Period Weeks   Status On-going   PT SHORT TERM GOAL #4   Title Patient negotiates ramps & curbs with LRAD with supervision.  (Target Date: 05/31/2015)   Time 4   Period Weeks   Status  On-going           PT Long Term Goals - 05/09/15 1104    PT LONG TERM GOAL #1   Title Patient verbalizes understanding of ongoing HEP / fitness plan.   (Target Date: 06/28/2015)   Time 8   Period Weeks   Status On-going   PT LONG TERM GOAL #2   Title Timed Up & Go safely <13.5sec  (Target Date: 06/28/2015)   Time 8   Period Weeks   Status On-going   PT LONG TERM GOAL #3   Title Berg Balance >/= 45/56  (Target Date: 06/28/2015)   Time 8   Period Weeks   Status On-going   PT LONG TERM GOAL #4   Title Patient ambulates around furniture similar to home situation without device modified independent.  (Target Date: 06/28/2015)   Time 8   Period  Weeks   Status On-going   PT LONG TERM GOAL #5   Title Patient ambulates 250' & negotiates ramps, curbs with LRAD modified independent.  (Target Date: 06/28/2015)   Time 8   Period Weeks   Status On-going               Plan - 05/13/15 1015    Clinical Impression Statement Patient appears to understand how to challenge balance & vestibular system within safe environment. Patient had increased sway with diagonal head movements over straight line movements. Patient was not able to stand on one leg long enough to challenge balance significantly so modified with one leg on 4" block.    Pt will benefit from skilled therapeutic intervention in order to improve on the following deficits Abnormal gait;Decreased activity tolerance;Decreased balance;Decreased endurance;Decreased knowledge of precautions;Decreased knowledge of use of DME;Decreased mobility;Decreased range of motion;Decreased strength   Rehab Potential Good   PT Frequency 2x / week   PT Duration 8 weeks   PT Treatment/Interventions ADLs/Self Care Home Management;DME Instruction;Gait training;Stair training;Functional mobility training;Therapeutic activities;Therapeutic exercise;Balance training;Neuromuscular re-education;Patient/family education;Orthotic Fit/Training;Manual techniques;Vestibular   PT Next Visit Plan Initiate R LE strengthening HEP   PT Home Exercise Plan Balance HEP   Consulted and Agree with Plan of Care Patient        Problem List Patient Active Problem List   Diagnosis Date Noted  . Dehydration   . Hypokalemia   . Altered level of consciousness 03/12/2015  . Acute encephalopathy 03/12/2015  . Gait difficulty 08/10/2014  . Multiple sclerosis 10/25/2013  . Insomnia 03/23/2013  . Fibroids 12/29/2012  . Elevated cholesterol   . Hypertension   . Arthritis   . Cervical dysplasia     Edee Nifong PT, DPT 05/13/2015, 9:16 PM  [facility] [address] [place], [place], [address] Phone: [phone]   Fax:  [phone]

## 2015-05-14 ENCOUNTER — Encounter (HOSPITAL_COMMUNITY): Payer: Federal, State, Local not specified - PPO

## 2015-05-16 ENCOUNTER — Ambulatory Visit: Payer: Federal, State, Local not specified - PPO | Admitting: Physical Therapy

## 2015-05-17 ENCOUNTER — Telehealth: Payer: Self-pay | Admitting: Neurology

## 2015-05-17 NOTE — Telephone Encounter (Signed)
On call received message stating that "patient has had a relapse/had an episode last night". I called back and received answering machine. Left a message, she can call back on Monday and make an appointment with Dr. Krista Blue but If she is having a relapse or significant symptoms, she may need to seek care at an emergency room depending on the symptoms and severity. I called multiple times and received the voicemail. The number provided in the page is the same as the one listed in her profile.  I left a second voicemail stating I tried reaching her again and she can page back if needed but to provide a number where I can reach her. Thank you.

## 2015-05-20 NOTE — Telephone Encounter (Signed)
[name]:   Please call and check on patient.

## 2015-05-20 NOTE — Telephone Encounter (Signed)
Spoke to Londin - reports she was feeling more fatigued than normal - she has been resting and drinking extra fluids.  Says she is feeling better now and does not feel she needs to come in for an earlier appointment.  Told her to please call us back if needed.

## 2015-05-21 ENCOUNTER — Ambulatory Visit: Payer: Federal, State, Local not specified - PPO

## 2015-05-21 ENCOUNTER — Other Ambulatory Visit: Payer: Self-pay | Admitting: Physician Assistant

## 2015-05-23 ENCOUNTER — Other Ambulatory Visit: Payer: Self-pay | Admitting: Physician Assistant

## 2015-05-24 ENCOUNTER — Ambulatory Visit: Payer: Federal, State, Local not specified - PPO | Admitting: Physical Therapy

## 2015-05-27 ENCOUNTER — Other Ambulatory Visit: Payer: Self-pay | Admitting: Neurology

## 2015-05-27 NOTE — Telephone Encounter (Signed)
Patient has appt scheduled

## 2015-05-28 ENCOUNTER — Telehealth: Payer: Self-pay | Admitting: *Deleted

## 2015-05-28 ENCOUNTER — Ambulatory Visit: Payer: Federal, State, Local not specified - PPO | Admitting: Physical Therapy

## 2015-05-28 NOTE — Telephone Encounter (Signed)
Rx for clonazepam faxed and confirmed to pharmacy.

## 2015-05-29 ENCOUNTER — Encounter (INDEPENDENT_AMBULATORY_CARE_PROVIDER_SITE_OTHER): Payer: Self-pay

## 2015-05-29 ENCOUNTER — Other Ambulatory Visit: Payer: Self-pay | Admitting: Neurology

## 2015-05-29 ENCOUNTER — Telehealth: Payer: Self-pay | Admitting: Neurology

## 2015-05-29 DIAGNOSIS — Z0289 Encounter for other administrative examinations: Secondary | ICD-10-CM

## 2015-05-30 ENCOUNTER — Ambulatory Visit: Payer: Federal, State, Local not specified - PPO | Admitting: Physical Therapy

## 2015-05-30 NOTE — Telephone Encounter (Signed)
error 

## 2015-06-04 ENCOUNTER — Telehealth: Payer: Self-pay | Admitting: Neurology

## 2015-06-04 ENCOUNTER — Ambulatory Visit: Payer: Federal, State, Local not specified - PPO

## 2015-06-04 MED ORDER — NITROFURANTOIN MONOHYD MACRO 100 MG PO CAPS: 100.0000 mg | ORAL_CAPSULE | Freq: Two times a day (BID) | ORAL | Status: DC

## 2015-06-04 NOTE — Telephone Encounter (Signed)
Please call patient, UA showed signs of mild UTI, This might explains some of her worsening symptoms of fatigue, weakness, confusion  Ask if she has any UTI symptoms, advise her keep well hydration, I have called in nitrofurantoin, macrocrystal-monohydrate, (MACROBID) 100 MG capsule x 5 days for her.

## 2015-06-04 NOTE — Telephone Encounter (Signed)
Spoke to Jayme - she is aware of results and will follow Dr. Rhea Belton orders for hydration and Macrobid.

## 2015-06-05 ENCOUNTER — Telehealth: Payer: Self-pay | Admitting: Neurology

## 2015-06-05 NOTE — Telephone Encounter (Signed)
Patient is calling to get a new Rx for hydrochlorothiazide (HYDRODIURIL) 25 MG tablet called to [facility] on [address]. Thank you.

## 2015-06-05 NOTE — Telephone Encounter (Signed)
It does not appear we have prescribed this medication.  Rx previously written by Harrison Mons, PA-C.  I called the patient back to clarify.  Got no answer.  Left message.

## 2015-06-06 ENCOUNTER — Ambulatory Visit: Payer: Federal, State, Local not specified - PPO | Admitting: Physical Therapy

## 2015-06-11 ENCOUNTER — Ambulatory Visit: Payer: Federal, State, Local not specified - PPO

## 2015-06-11 ENCOUNTER — Other Ambulatory Visit: Payer: Self-pay | Admitting: Neurology

## 2015-06-12 ENCOUNTER — Other Ambulatory Visit (INDEPENDENT_AMBULATORY_CARE_PROVIDER_SITE_OTHER): Payer: Self-pay

## 2015-06-12 ENCOUNTER — Other Ambulatory Visit: Payer: Self-pay | Admitting: Neurology

## 2015-06-12 DIAGNOSIS — G35 Multiple sclerosis: Secondary | ICD-10-CM

## 2015-06-12 DIAGNOSIS — Z0289 Encounter for other administrative examinations: Secondary | ICD-10-CM

## 2015-06-12 NOTE — Telephone Encounter (Signed)
Prescribed at [facility] on 02/24

## 2015-06-13 ENCOUNTER — Telehealth: Payer: Self-pay | Admitting: Neurology

## 2015-06-13 ENCOUNTER — Ambulatory Visit: Payer: Federal, State, Local not specified - PPO | Admitting: Physical Therapy

## 2015-06-13 LAB — CBC
HEMOGLOBIN: 10.5 g/dL — AB (ref 11.1–15.9)
Hematocrit: 32.9 % — ABNORMAL LOW (ref 34.0–46.6)
MCH: 27.6 pg (ref 26.6–33.0)
MCHC: 31.9 g/dL (ref 31.5–35.7)
MCV: 87 fL (ref 79–97)
Platelets: 106 10*3/uL — ABNORMAL LOW (ref 150–379)
RBC: 3.8 x10E6/uL (ref 3.77–5.28)
RDW: 16.2 % — ABNORMAL HIGH (ref 12.3–15.4)
WBC: 6.1 10*3/uL (ref 3.4–10.8)

## 2015-06-13 LAB — SPECIMEN STATUS REPORT

## 2015-06-13 NOTE — Telephone Encounter (Signed)
Please call patient, lab showed mild anemia, Hg 10.5, with elevated RDW, ask her if she has black stools, has she had her regular colonoscopy exam yet? Will repeat test later, I also forward the lab to her PCP

## 2015-06-13 NOTE — Telephone Encounter (Signed)
Left message for a return call

## 2015-06-13 NOTE — Telephone Encounter (Signed)
Aware of results - she is going to make an appt for a physical with her PCP and ask him to order her colonoscopy.

## 2015-06-13 NOTE — Telephone Encounter (Signed)
Pt returning call for nurse

## 2015-06-18 ENCOUNTER — Ambulatory Visit: Payer: Federal, State, Local not specified - PPO | Admitting: Physical Therapy

## 2015-06-18 ENCOUNTER — Telehealth: Payer: Self-pay | Admitting: *Deleted

## 2015-06-18 NOTE — Telephone Encounter (Signed)
Labs collected 06/13/15: JCV 0.58 positive

## 2015-06-20 ENCOUNTER — Ambulatory Visit: Payer: Federal, State, Local not specified - PPO | Admitting: Physical Therapy

## 2015-06-21 LAB — MICROSCOPIC EXAMINATION
Casts: NONE SEEN /lpf
EPITHELIAL CELLS (NON RENAL): NONE SEEN /HPF (ref 0–10)

## 2015-06-21 LAB — URINALYSIS, ROUTINE W REFLEX MICROSCOPIC
Bilirubin, UA: NEGATIVE
Glucose, UA: NEGATIVE
Ketones, UA: NEGATIVE
NITRITE UA: POSITIVE — AB
Protein, UA: NEGATIVE
RBC, UA: NEGATIVE
Specific Gravity, UA: 1.017 (ref 1.005–1.030)
Urobilinogen, Ur: 0.2 mg/dL (ref 0.2–1.0)
pH, UA: 6 (ref 5.0–7.5)

## 2015-06-21 LAB — SPECIMEN STATUS REPORT

## 2015-06-25 ENCOUNTER — Ambulatory Visit: Payer: Federal, State, Local not specified - PPO | Admitting: Physical Therapy

## 2015-06-26 ENCOUNTER — Ambulatory Visit (INDEPENDENT_AMBULATORY_CARE_PROVIDER_SITE_OTHER): Payer: Federal, State, Local not specified - PPO | Admitting: Physician Assistant

## 2015-06-26 ENCOUNTER — Encounter: Payer: Self-pay | Admitting: Physician Assistant

## 2015-06-26 VITALS — BP 128/66 | HR 83 | Temp 98.2°F | Ht 60.0 in | Wt 119.0 lb

## 2015-06-26 DIAGNOSIS — D649 Anemia, unspecified: Secondary | ICD-10-CM | POA: Diagnosis not present

## 2015-06-26 LAB — POCT CBC
Granulocyte percent: 58.7 %G (ref 37–80)
HCT, POC: 34.1 % — AB (ref 37.7–47.9)
HEMOGLOBIN: 10.6 g/dL — AB (ref 12.2–16.2)
LYMPH, POC: 2.2 (ref 0.6–3.4)
MCH, POC: 26.5 pg — AB (ref 27–31.2)
MCHC: 30.9 g/dL — AB (ref 31.8–35.4)
MCV: 85.7 fL (ref 80–97)
MID (cbc): 0.6 (ref 0–0.9)
MPV: 9.9 fL (ref 0–99.8)
PLATELET COUNT, POC: 160 10*3/uL (ref 142–424)
POC Granulocyte: 4 (ref 2–6.9)
POC LYMPH PERCENT: 32.6 %L (ref 10–50)
POC MID %: 8.7 % (ref 0–12)
RBC: 3.98 M/uL — AB (ref 4.04–5.48)
RDW, POC: 17.4 %
WBC: 6.8 10*3/uL (ref 4.6–10.2)

## 2015-06-26 NOTE — Progress Notes (Deleted)
Subjective:     Patient ID: Wanda [name], female   DOB: [birth year], 64 y.o.   MRN: [id number] PCP: JEFFERY,[name], PA-C  No chief complaint on file.   HPI Her neurologist sent her here to get a physical, and a recommendation for a colonoscopy. She says there is a drop in her iron levels. In the last 2 months, she has lost about 8 pounds in 2 months (127 lbs to 119 lbs today). She has not been trying to lose weight. Labs from neurologist showed mild anemia. Her mother passed away from colon cancer at the age of 41. Last colonoscopy was in 2009. No melena or hematochezia.     Review of Systems   Objective:   Physical Exam     Assessment:     ***    Plan:     ***

## 2015-06-26 NOTE — Progress Notes (Signed)
Patient ID: Wanda [name], female    DOB: [birth year], 64 y.o.   MRN: [id number]  PCP: JEFFERY,[name], PA-C  Chief Complaint  Patient presents with  . Annual Exam    needs a colonoscopy    Subjective:   HPI: Presents for annual exam in order to obtain a referral for colonoscopy requested by her neurologist.  At her visit there 2 weeks ago, she was noted to be anemic.  She doesn't actually want a CPE, just the referral.  CBC 10.5 06/12/15, down from 12.3 03/2015.  Colonoscopy 2009, normal. Declines zostavax and flu vaccines today. Last mammogram 02/2013. HIV and Hep C screening complete.  This morning after her bath, "I think I over relaxed my muscles."  Couldn't get out of the bathtub. Waited for her daughter to arrive and help her. She continues to live alone, and likes her independence. "My kids want to over do for me." Her daughter checks on her and helps with the 2 chihuahuas. Her son was here for a week from [place], and calls her twice a day. "Between her and him, they about drive me crazy."  RIGHT leg "is no good and the foot swells."   Patient Active Problem List   Diagnosis Date Noted  . Dehydration   . Hypokalemia   . Altered level of consciousness 03/12/2015  . Acute encephalopathy 03/12/2015  . Gait difficulty 08/10/2014  . Multiple sclerosis 10/25/2013  . Insomnia 03/23/2013  . Fibroids 12/29/2012  . Elevated cholesterol   . Hypertension   . Arthritis   . Cervical dysplasia     Past Medical History  Diagnosis Date  . Elevated cholesterol   . Hypertension   . Arthritis   . Cervical dysplasia   . Depression   . Anxiety   . Macular degeneration of right eye 2001  . MS (multiple sclerosis) 08/2013     Prior to Admission medications   Medication Sig Start Date End Date Taking? Authorizing Provider  atorvastatin (LIPITOR) 20 MG tablet take 1 tablet by mouth once daily 05/28/14   [name] Jeffery, PA-C  clonazePAM (KLONOPIN) 1 MG tablet take 1 tablet by  mouth twice a day if needed for anxiety 05/27/15   Marcial Pacas, MD  Cyanocobalamin (B-12 PO) Take 1 tablet by mouth daily.    Historical Provider, MD  estradiol (ESTRACE) 0.5 MG tablet take 1 tablet by mouth once daily 01/19/14   Huel Cote, NP  hydrochlorothiazide (HYDRODIURIL) 25 MG tablet  02/14/15   Historical Provider, MD  ibuprofen (ADVIL,MOTRIN) 200 MG tablet Take 1,000 mg by mouth daily as needed for mild pain or moderate pain.     Historical Provider, MD  medroxyPROGESTERone (PROVERA) 2.5 MG tablet take 1 tablet by mouth once daily 01/19/14   Huel Cote, NP  meloxicam (MOBIC) 15 MG tablet take 1 tablet by mouth once daily 06/12/15   Marcial Pacas, MD  modafinil (PROVIGIL) 200 MG tablet Take 1 tablet (200 mg total) by mouth daily. 02/26/15   Marcial Pacas, MD  nitrofurantoin, macrocrystal-monohydrate, (MACROBID) 100 MG capsule Take 1 capsule (100 mg total) by mouth 2 (two) times daily. 06/04/15   Marcial Pacas, MD  Omega-3 Fatty Acids (FISH OIL PO) Take 1 tablet by mouth daily.    Historical Provider, MD  traZODone (DESYREL) 50 MG tablet TAKE 1/2 TO 1 TABLET BY MOUTH AT BEDTIME IF NEEDED FOR SLEEP.  "OV NEEDED" 05/24/15   Harrison Mons, PA-C  UNABLE TO FIND Med Name: Baclofen ER  Historical Provider, MD  varenicline (CHANTIX) 1 MG tablet Take 1 tablet (1 mg total) by mouth 2 (two) times daily. 12/05/14   [name] Jeffery, PA-C  VITAMIN E PO Take 1 tablet by mouth daily.    Historical Provider, MD    No Known Allergies  Past Surgical History  Procedure Laterality Date  . Colposcopy    . Knee surgery    . Cholecystectomy    . Tubal ligation    . Cortisone injections      Bil knees    Family History  Problem Relation Age of Onset  . Cancer Mother     Colon  . Diabetes Mother   . Hypertension Mother   . Arthritis Mother   . Cancer Father     prostate  . Kidney disease Brother     congenital single kidney  . Arthritis Sister   . Arthritis Sister   . Hematuria Son   . Gout Brother   .  Multiple sclerosis Brother   . Arthritis Brother   . HIV Brother   . Cancer Brother     spinal    Social History   Social History  . Marital Status: Widowed    Spouse Name: N/A  . Number of Children: 2  . Years of Education: college   Occupational History  . retired     Social History Main Topics  . Smoking status: Current Every Day Smoker -- 1.00 packs/day for 45 years    Types: Cigarettes    Last Attempt to Quit: 10/23/2013  . Smokeless tobacco: Never Used  . Alcohol Use: No     Comment: hasn't drank anything since jan 2015  . Drug Use: No  . Sexual Activity: No   Other Topics Concern  . None   Social History Narrative   Grew up in [place] area, 1 of 10 siblings - 63 still living, married for 33 years then divorced, moved to [place] to be near daughter post retirement, als has 1 son. 2 dogs.       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        Review of Systems As above. No CP, SOB, HA, dizziness, GI/GU symptoms. No increased fatigue/weakness.     Objective:  Physical Exam  Constitutional: She is oriented to person, place, and time. Vital signs are normal. She appears well-developed and well-nourished. She is active and cooperative. No distress.  BP 128/66 mmHg  Pulse 83  Temp(Src) 98.2 F (36.8 C) (Oral)  Ht 5' (1.524 m)  Wt 119 lb (53.978 kg)  BMI 23.24 kg/m2  SpO2 97% Ambulates with the assistance of a cane.  HENT:  Head: Normocephalic and atraumatic.  Right Ear: Hearing normal.  Left Ear: Hearing normal.  Eyes: Conjunctivae are normal. No scleral icterus.  Neck: Normal range of motion. Neck supple. No thyromegaly present.  Cardiovascular: Normal rate, regular rhythm and normal heart sounds.   Pulses:      Radial pulses are 2+ on the right side, and 2+ on the left side.  Pulmonary/Chest: Effort normal and breath sounds normal.  Lymphadenopathy:       Head (right side): No tonsillar, no preauricular, no posterior  auricular and no occipital adenopathy present.       Head (left side): No tonsillar, no preauricular, no posterior auricular and no occipital adenopathy present.    She has no cervical adenopathy.       Right: No supraclavicular adenopathy  present.       Left: No supraclavicular adenopathy present.  Neurological: She is alert and oriented to person, place, and time.  Skin: Skin is warm, dry and intact. No rash noted. No cyanosis or erythema. Nails show no clubbing.  Psychiatric: She has a normal mood and affect. Her speech is normal and behavior is normal.       Results for orders placed or performed in visit on 06/26/15  POCT CBC  Result Value Ref Range   WBC 6.8 4.6 - 10.2 K/uL   Lymph, poc 2.2 0.6 - 3.4   POC LYMPH PERCENT 32.6 10 - 50 %L   MID (cbc) 0.6 0 - 0.9   POC MID % 8.7 0 - 12 %M   POC Granulocyte 4.0 2 - 6.9   Granulocyte percent 58.7 37 - 80 %G   RBC 3.98 (A) 4.04 - 5.48 M/uL   Hemoglobin 10.6 (A) 12.2 - 16.2 g/dL   HCT, POC 34.1 (A) 37.7 - 47.9 %   MCV 85.7 80 - 97 fL   MCH, POC 26.5 (A) 27 - 31.2 pg   MCHC 30.9 (A) 31.8 - 35.4 g/dL   RDW, POC 17.4 %   Platelet Count, POC 160 142 - 424 K/uL   MPV 9.9 0 - 99.8 fL       Assessment & Plan:  1. Anemia, unspecified anemia type Await home stool cards for occult blood. Refer to GI for additional evaluation. - Ambulatory referral to Gastroenterology - POC Hemoccult Bld/Stl (3-Cd Home Screen); Future - POCT CBC   Fara Chute, PA-C Physician Assistant-Certified [facility]

## 2015-06-26 NOTE — Patient Instructions (Addendum)
Being Mortal by Eda Keys, MD is an excellent book to read to start conversations about what you want your health care power of attorney to know.  Return the kit for testing for blood in the stool. The results will help the gastroenterologist decide how to proceed.

## 2015-06-27 ENCOUNTER — Encounter: Payer: Self-pay | Admitting: Physician Assistant

## 2015-06-27 ENCOUNTER — Ambulatory Visit: Payer: Federal, State, Local not specified - PPO | Admitting: Physical Therapy

## 2015-06-28 ENCOUNTER — Other Ambulatory Visit: Payer: Self-pay | Admitting: Physician Assistant

## 2015-06-30 ENCOUNTER — Other Ambulatory Visit: Payer: Self-pay | Admitting: Physician Assistant

## 2015-07-01 NOTE — Telephone Encounter (Signed)
[name], OK to RF trazadone, or do you need to see her back again for this first?

## 2015-07-15 ENCOUNTER — Other Ambulatory Visit (INDEPENDENT_AMBULATORY_CARE_PROVIDER_SITE_OTHER): Payer: Federal, State, Local not specified - PPO

## 2015-07-15 ENCOUNTER — Encounter: Payer: Self-pay | Admitting: Physician Assistant

## 2015-07-15 ENCOUNTER — Ambulatory Visit (INDEPENDENT_AMBULATORY_CARE_PROVIDER_SITE_OTHER): Payer: Federal, State, Local not specified - PPO | Admitting: Physician Assistant

## 2015-07-15 VITALS — BP 110/74 | HR 68 | Ht 60.0 in | Wt 123.0 lb

## 2015-07-15 DIAGNOSIS — Z800 Family history of malignant neoplasm of digestive organs: Secondary | ICD-10-CM | POA: Diagnosis not present

## 2015-07-15 DIAGNOSIS — D696 Thrombocytopenia, unspecified: Secondary | ICD-10-CM | POA: Diagnosis not present

## 2015-07-15 DIAGNOSIS — D649 Anemia, unspecified: Secondary | ICD-10-CM

## 2015-07-15 LAB — CBC WITH DIFFERENTIAL/PLATELET
BASOS ABS: 0.1 10*3/uL (ref 0.0–0.1)
BASOS PCT: 0.8 % (ref 0.0–3.0)
EOS ABS: 0.3 10*3/uL (ref 0.0–0.7)
Eosinophils Relative: 5.2 % — ABNORMAL HIGH (ref 0.0–5.0)
HEMATOCRIT: 35.3 % — AB (ref 36.0–46.0)
HEMOGLOBIN: 11.8 g/dL — AB (ref 12.0–15.0)
LYMPHS PCT: 41.7 % (ref 12.0–46.0)
Lymphs Abs: 2.6 10*3/uL (ref 0.7–4.0)
MCHC: 33.4 g/dL (ref 30.0–36.0)
MCV: 84.2 fl (ref 78.0–100.0)
MONO ABS: 0.5 10*3/uL (ref 0.1–1.0)
Monocytes Relative: 8.5 % (ref 3.0–12.0)
Neutro Abs: 2.7 10*3/uL (ref 1.4–7.7)
Neutrophils Relative %: 43.8 % (ref 43.0–77.0)
Platelets: 127 10*3/uL — ABNORMAL LOW (ref 150.0–400.0)
RBC: 4.19 Mil/uL (ref 3.87–5.11)
RDW: 16.1 % — AB (ref 11.5–15.5)
WBC: 6.1 10*3/uL (ref 4.0–10.5)

## 2015-07-15 LAB — FOLATE: FOLATE: 19.2 ng/mL (ref >5.9)

## 2015-07-15 LAB — IBC PANEL
IRON: 80 ug/dL (ref 42–145)
SATURATION RATIOS: 21.8 % (ref 20.0–50.0)
TRANSFERRIN: 262 mg/dL (ref 212.0–360.0)

## 2015-07-15 LAB — VITAMIN B12: Vitamin B-12: >1500 pg/mL — ABNORMAL HIGH (ref 211–911)

## 2015-07-15 MED ORDER — NA SULFATE-K SULFATE-MG SULF 17.5-3.13-1.6 GM/177ML PO SOLN: 1.0000 | Freq: Once | ORAL | Status: DC

## 2015-07-15 NOTE — Progress Notes (Signed)
Patient ID: Wanda [name], female   DOB: 11-22-1950, 64 y.o.   MRN: [id number]   Subjective:    Patient ID: Wanda [name], female    DOB: 08-01-1951, 64 y.o.   MRN: [id number]  HPI  Wanda [name] is a pleasant 64 year old African American female referred today by Wanda Dellis Filbert PA for evaluation of anemia. She is known to Wanda [name] and under undergone colonoscopy in 2009 which was a normal exam. She does have family history of colon cancer in her brother diagnosed at a very young age in his 60s. Patient has multiple sclerosis, is on Tysabri  and is being followed by Wanda [name]. Recent labs have been done on 06/12/2015 and showed hemoglobin of 10.5 hematocrit of 32.9 MCV of 87 and platelets 106 in June 2016 hemoglobin 12.9, platelets 182. She has not had Hemoccults done. She says she eats a very healthy paleo  diet and has not noticed any changes in her bowel habits,  problems with constipation, diarrhea, melena, or hematochezia. Denies any heartburn ,indigestion dysphagia etc. She says occasionally she gets a sharp pain on her left side that this is usually with lying down.  Review of Systems Pertinent positive and negative review of systems were noted in the above HPI section.  All other review of systems was otherwise negative.  Outpatient Encounter Prescriptions as of 07/15/2015  Medication Sig  . atorvastatin (LIPITOR) 20 MG tablet take 1 tablet by mouth once daily  . clonazePAM (KLONOPIN) 1 MG tablet take 1 tablet by mouth twice a day if needed for anxiety  . Cyanocobalamin (B-12 PO) Take 1 tablet by mouth daily.  Marland Kitchen ibuprofen (ADVIL,MOTRIN) 200 MG tablet Take 1,000 mg by mouth daily as needed for mild pain or moderate pain.   . meloxicam (MOBIC) 15 MG tablet take 1 tablet by mouth once daily  . modafinil (PROVIGIL) 200 MG tablet Take 1 tablet (200 mg total) by mouth daily.  . nitrofurantoin, macrocrystal-monohydrate, (MACROBID) 100 MG capsule Take 1 capsule (100 mg total) by mouth 2 (two) times daily.   . Omega-3 Fatty Acids (FISH OIL PO) Take 1 tablet by mouth daily.  . traZODone (DESYREL) 50 MG tablet TAKE 1/2 TO 1 TABLET BY MOUTH AT BEDTIME IF NEEDED FOR SLEEP  . VITAMIN E PO Take 1 tablet by mouth daily.  . Na Sulfate-K Sulfate-Mg Sulf SOLN Take 1 kit by mouth once. Per colonoscopy instruction  . [DISCONTINUED] estradiol (ESTRACE) 0.5 MG tablet take 1 tablet by mouth once daily (Patient not taking: Reported on 07/15/2015)  . [DISCONTINUED] hydrochlorothiazide (HYDRODIURIL) 25 MG tablet   . [DISCONTINUED] medroxyPROGESTERone (PROVERA) 2.5 MG tablet take 1 tablet by mouth once daily (Patient not taking: Reported on 07/15/2015)  . [DISCONTINUED] UNABLE TO FIND Med Name: Baclofen ER  . [DISCONTINUED] varenicline (CHANTIX) 1 MG tablet Take 1 tablet (1 mg total) by mouth 2 (two) times daily. (Patient not taking: Reported on 07/15/2015)   Facility-Administered Encounter Medications as of 07/15/2015  Medication  . loratadine (CLARITIN) tablet 10 mg  . methylPREDNISolone sodium succinate (SOLU-MEDROL) 500 mg in sodium chloride 0.9 % 50 mL IVPB   No Known Allergies Patient Active Problem List   Diagnosis Date Noted  . Dehydration   . Hypokalemia   . Altered level of consciousness 03/12/2015  . Acute encephalopathy 03/12/2015  . Gait difficulty 08/10/2014  . Multiple sclerosis (Wanda [name]) 10/25/2013  . Insomnia 03/23/2013  . Fibroids 12/29/2012  . Elevated cholesterol   . Hypertension   . Arthritis   .  Cervical dysplasia    Social History   Social History  . Marital Status: Widowed    Spouse Name: N/A  . Number of Children: 2  . Years of Education: college   Occupational History  . retired     Social History Main Topics  . Smoking status: Former Smoker -- 1.00 packs/day for 45 years    Types: Cigarettes    Quit date: 10/23/2013  . Smokeless tobacco: Never Used     Comment: Quit in 2014.  Marland Kitchen Alcohol Use: No     Comment: hasn't drank anything since jan 2015  . Drug Use: No  .  Sexual Activity: No   Other Topics Concern  . Not on file   Social History Narrative   Grew up in [place] area, 1 of 10 siblings - 65 still living, married for 33 years then divorced, moved to [place] to be near daughter post retirement, als has 1 son. 2 dogs.       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     Ms. Lauricella family history includes Arthritis in her brother, mother, sister, and sister; Cancer in her brother, father, and mother; Diabetes in her mother; Gout in her brother; HIV in her brother; Hematuria in her son; Hypertension in her mother; Kidney disease in her brother; Multiple sclerosis in her brother.      Objective:    Filed Vitals:   07/15/15 0909  BP: 110/74  Pulse: 68    Physical Exam  well-developed African-American female in no acute distress, pleasant ambulating with a walker blood pressure 110/74 pulse 68 height 5 foot weight 123. HEENT ;nontraumatic normocephalic EOMI PERRLA sclera anicteric, Cardiovascular; regular rate and rhythm with S1-S2 no murmur or gallop, Pulmonary; clear bilaterally, Abdomen ;soft nontender nondistended bowel sounds are active is no palpable mass or hepatosplenomegaly bowel sounds are present exam not done, Extremities ;no clubbing cyanosis or edema she does have weakness bilateral lower extremities and right greater than left upper extremities secondary to MS,, Neuropsych; mood and affect appropriate       Assessment & [name]:   #1 64 yo female with new normocytic anemia- etiology unclear will r/o FE deficiency ,occult GI Blood loss #2 thrombocytopenia- with anemia- r/o myelodysplastic process #3 Family hx of colon cancer in brother- over due for Colonoscopy #4 MS #5 HTN  [name]; Will repeat CBC, check iron studies B12 and folate Schedule for colonoscopy with Wanda [name]. Procedure discussed in detail with the patient and she is agreeable to proceed. If patient has persistent anemia and thrombocytopenia and   colonoscopy negative she will need referral to hematology.   Wanda [name] S Amen Staszak PA-C 07/15/2015   Cc: Wanda Mons, PA-C

## 2015-07-15 NOTE — Progress Notes (Signed)
Reviewed and agree with management plan.  Markies Mowatt T. Satish Hammers, MD FACG 

## 2015-07-15 NOTE — Patient Instructions (Addendum)
You have been scheduled for a colonoscopy. Please follow written instructions given to you at your visit today.  Please pick up your prep supplies at the pharmacy within the next 1-3 days. If you use inhalers (even only as needed), please bring them with you on the day of your procedure.  Your physician has requested that you go to the basement for the following lab work before leaving today: CBC, IBC panel, B12/folate

## 2015-07-29 ENCOUNTER — Observation Stay (HOSPITAL_COMMUNITY)
Admission: EM | Admit: 2015-07-29 | Discharge: 2015-07-30 | Disposition: A | Discharge status: Home / Self Care | Payer: Federal, State, Local not specified - PPO | Attending: Family Medicine | Admitting: Family Medicine

## 2015-07-29 ENCOUNTER — Inpatient Hospital Stay (HOSPITAL_COMMUNITY): Payer: Federal, State, Local not specified - PPO

## 2015-07-29 ENCOUNTER — Emergency Department (HOSPITAL_COMMUNITY): Payer: Federal, State, Local not specified - PPO

## 2015-07-29 ENCOUNTER — Telehealth: Payer: Self-pay

## 2015-07-29 ENCOUNTER — Encounter (HOSPITAL_COMMUNITY): Payer: Self-pay

## 2015-07-29 DIAGNOSIS — Z87891 Personal history of nicotine dependence: Secondary | ICD-10-CM | POA: Diagnosis not present

## 2015-07-29 DIAGNOSIS — R410 Disorientation, unspecified: Secondary | ICD-10-CM

## 2015-07-29 DIAGNOSIS — D649 Anemia, unspecified: Secondary | ICD-10-CM | POA: Diagnosis not present

## 2015-07-29 DIAGNOSIS — R4182 Altered mental status, unspecified: Principal | ICD-10-CM | POA: Diagnosis present

## 2015-07-29 DIAGNOSIS — I10 Essential (primary) hypertension: Secondary | ICD-10-CM | POA: Diagnosis not present

## 2015-07-29 DIAGNOSIS — E785 Hyperlipidemia, unspecified: Secondary | ICD-10-CM | POA: Insufficient documentation

## 2015-07-29 DIAGNOSIS — G35 Multiple sclerosis: Secondary | ICD-10-CM | POA: Diagnosis not present

## 2015-07-29 LAB — I-STAT CHEM 8, ED
BUN: 18 mg/dL (ref 6–20)
CHLORIDE: 115 mmol/L — AB (ref 101–111)
CREATININE: 0.9 mg/dL (ref 0.44–1.00)
Calcium, Ion: 1.24 mmol/L (ref 1.13–1.30)
GLUCOSE: 108 mg/dL — AB (ref 65–99)
HEMATOCRIT: 44 % (ref 36.0–46.0)
HEMOGLOBIN: 15 g/dL (ref 12.0–15.0)
POTASSIUM: 4.2 mmol/L (ref 3.5–5.1)
Sodium: 144 mmol/L (ref 135–145)
TCO2: 17 mmol/L (ref 0–100)

## 2015-07-29 LAB — COMPREHENSIVE METABOLIC PANEL
ALK PHOS: 80 U/L (ref 38–126)
ALT: 40 U/L (ref 14–54)
AST: 31 U/L (ref 15–41)
Albumin: 3.8 g/dL (ref 3.5–5.0)
Anion gap: 12 (ref 5–15)
BILIRUBIN TOTAL: 0.3 mg/dL (ref 0.3–1.2)
BUN: 14 mg/dL (ref 6–20)
CHLORIDE: 112 mmol/L — AB (ref 101–111)
CO2: 22 mmol/L (ref 22–32)
CREATININE: 0.86 mg/dL (ref 0.44–1.00)
Calcium: 10 mg/dL (ref 8.9–10.3)
GFR calc non Af Amer: >60 mL/min (ref >60)
Glucose, Bld: 114 mg/dL — ABNORMAL HIGH (ref 65–99)
POTASSIUM: 4.3 mmol/L (ref 3.5–5.1)
SODIUM: 146 mmol/L — AB (ref 135–145)
Total Protein: 7.4 g/dL (ref 6.5–8.1)

## 2015-07-29 LAB — CBC
HCT: 32.1 % — ABNORMAL LOW (ref 36.0–46.0)
HEMATOCRIT: 40.2 % (ref 36.0–46.0)
Hemoglobin: 13 g/dL (ref 12.0–15.0)
Hemoglobin: 10.6 g/dL — ABNORMAL LOW (ref 12.0–15.0)
MCH: 28.2 pg (ref 26.0–34.0)
MCH: 28.3 pg (ref 26.0–34.0)
MCHC: 32.3 g/dL (ref 30.0–36.0)
MCHC: 33 g/dL (ref 30.0–36.0)
MCV: 87.2 fL (ref 78.0–100.0)
MCV: 85.8 fL (ref 78.0–100.0)
PLATELETS: 145 10*3/uL — AB (ref 150–400)
Platelets: 119 10*3/uL — ABNORMAL LOW (ref 150–400)
RBC: 4.61 MIL/uL (ref 3.87–5.11)
RBC: 3.74 MIL/uL — ABNORMAL LOW (ref 3.87–5.11)
RDW: 16.1 % — ABNORMAL HIGH (ref 11.5–15.5)
RDW: 16 % — ABNORMAL HIGH (ref 11.5–15.5)
WBC: 7.1 10*3/uL (ref 4.0–10.5)
WBC: 7.6 10*3/uL (ref 4.0–10.5)

## 2015-07-29 LAB — I-STAT CG4 LACTIC ACID, ED: Lactic Acid, Venous: 2.24 mmol/L (ref 0.5–2.0)

## 2015-07-29 LAB — RAPID URINE DRUG SCREEN, HOSP PERFORMED
AMPHETAMINES: NOT DETECTED
BENZODIAZEPINES: POSITIVE — AB
Barbiturates: NOT DETECTED
COCAINE: NOT DETECTED
OPIATES: NOT DETECTED
Tetrahydrocannabinol: NOT DETECTED

## 2015-07-29 LAB — DIFFERENTIAL
Basophils Absolute: 0.1 10*3/uL (ref 0.0–0.1)
Basophils Relative: 1 %
EOS PCT: 3 %
Eosinophils Absolute: 0.2 10*3/uL (ref 0.0–0.7)
LYMPHS PCT: 39 %
Lymphs Abs: 2.8 10*3/uL (ref 0.7–4.0)
MONOS PCT: 7 %
Monocytes Absolute: 0.5 10*3/uL (ref 0.1–1.0)
NEUTROS ABS: 3.5 10*3/uL (ref 1.7–7.7)
NEUTROS PCT: 50 %

## 2015-07-29 LAB — BASIC METABOLIC PANEL
Anion gap: 6 (ref 5–15)
BUN: 18 mg/dL (ref 6–20)
CALCIUM: 8.6 mg/dL — AB (ref 8.9–10.3)
CO2: 22 mmol/L (ref 22–32)
CREATININE: 0.95 mg/dL (ref 0.44–1.00)
Chloride: 112 mmol/L — ABNORMAL HIGH (ref 101–111)
Glucose, Bld: 107 mg/dL — ABNORMAL HIGH (ref 65–99)
Potassium: 4.2 mmol/L (ref 3.5–5.1)
SODIUM: 140 mmol/L (ref 135–145)

## 2015-07-29 LAB — URINALYSIS, ROUTINE W REFLEX MICROSCOPIC
Bilirubin Urine: NEGATIVE
GLUCOSE, UA: NEGATIVE mg/dL
HGB URINE DIPSTICK: NEGATIVE
Ketones, ur: NEGATIVE mg/dL
Nitrite: NEGATIVE
PH: 5.5 (ref 5.0–8.0)
Protein, ur: NEGATIVE mg/dL
SPECIFIC GRAVITY, URINE: 1.015 (ref 1.005–1.030)
Urobilinogen, UA: 0.2 mg/dL (ref 0.0–1.0)

## 2015-07-29 LAB — CK: CK TOTAL: 142 U/L (ref 38–234)

## 2015-07-29 LAB — I-STAT TROPONIN, ED: Troponin i, poc: 0 ng/mL (ref 0.00–0.08)

## 2015-07-29 LAB — URINE MICROSCOPIC-ADD ON

## 2015-07-29 LAB — TSH: TSH: 0.498 u[IU]/mL (ref 0.350–4.500)

## 2015-07-29 LAB — CBG MONITORING, ED: Glucose-Capillary: 94 mg/dL (ref 65–99)

## 2015-07-29 LAB — LACTIC ACID, PLASMA: Lactic Acid, Venous: 1 mmol/L (ref 0.5–2.0)

## 2015-07-29 LAB — AMMONIA: AMMONIA: 36 umol/L — AB (ref 9–35)

## 2015-07-29 LAB — ETHANOL: Alcohol, Ethyl (B): <5 mg/dL (ref <5)

## 2015-07-29 MED ORDER — TRAZODONE HCL 50 MG PO TABS
50.0000 mg | ORAL_TABLET | Freq: Every day | ORAL | Status: DC
  Administered 2015-07-29: 50 mg via ORAL
  Filled 2015-07-29: qty 1

## 2015-07-29 MED ORDER — MELOXICAM 7.5 MG PO TABS
15.0000 mg | ORAL_TABLET | Freq: Every day | ORAL | Status: DC
  Administered 2015-07-29 – 2015-07-30 (×2): 15 mg via ORAL
  Filled 2015-07-29 (×2): qty 2

## 2015-07-29 MED ORDER — SODIUM CHLORIDE 0.9 % IV SOLN
INTRAVENOUS | Status: DC
  Administered 2015-07-29 – 2015-07-30 (×3): via INTRAVENOUS

## 2015-07-29 MED ORDER — ATORVASTATIN CALCIUM 20 MG PO TABS
20.0000 mg | ORAL_TABLET | Freq: Every day | ORAL | Status: DC
  Administered 2015-07-29 – 2015-07-30 (×2): 20 mg via ORAL
  Filled 2015-07-29 (×2): qty 1

## 2015-07-29 MED ORDER — ENOXAPARIN SODIUM 40 MG/0.4ML ~~LOC~~ SOLN
40.0000 mg | SUBCUTANEOUS | Status: DC
  Administered 2015-07-29 – 2015-07-30 (×2): 40 mg via SUBCUTANEOUS
  Filled 2015-07-29 (×2): qty 0.4

## 2015-07-29 MED ORDER — MODAFINIL 100 MG PO TABS
200.0000 mg | ORAL_TABLET | Freq: Every day | ORAL | Status: DC
  Administered 2015-07-30: 200 mg via ORAL
  Filled 2015-07-29 (×2): qty 2

## 2015-07-29 MED ORDER — BACLOFEN 20 MG PO TABS
30.0000 mg | ORAL_TABLET | Freq: Every day | ORAL | Status: DC
  Administered 2015-07-29 – 2015-07-30 (×2): 30 mg via ORAL
  Filled 2015-07-29 (×4): qty 1

## 2015-07-29 MED ORDER — SODIUM CHLORIDE 0.9 % IV BOLUS (SEPSIS)
1000.0000 mL | Freq: Once | INTRAVENOUS | Status: AC
Start: 2015-07-29 — End: 2015-07-29
  Administered 2015-07-29: 1000 mL via INTRAVENOUS

## 2015-07-29 MED ORDER — GADOBENATE DIMEGLUMINE 529 MG/ML IV SOLN
10.0000 mL | Freq: Once | INTRAVENOUS | Status: AC | PRN
  Administered 2015-07-29: 10 mL via INTRAVENOUS

## 2015-07-29 MED ORDER — CLONAZEPAM 1 MG PO TABS
1.0000 mg | ORAL_TABLET | Freq: Two times a day (BID) | ORAL | Status: DC | PRN
  Administered 2015-07-29 – 2015-07-30 (×2): 1 mg via ORAL
  Filled 2015-07-29 (×2): qty 1

## 2015-07-29 NOTE — Telephone Encounter (Signed)
I spoke to the patient's daughter in regards to her study medication. The patient is in the ER due to altered mental status, and the pharmacist wanted to know more information about the patient's baclofen intake. I told the pharmacist that the patient takes a 30mg  and 40mg  Baclofen ER (total of 70mg ) every night 30 minutes after the evening meal. I also explained to the pharmacist that this study medication is provided by the Research Department at [facility]. Pharmacist voiced understanding.

## 2015-07-29 NOTE — ED Notes (Signed)
Attempted IV start X2. MD aware, will consult IV team.

## 2015-07-29 NOTE — Consult Note (Signed)
NEURO HOSPITALIST CONSULT NOTE   Referring physician: Mcdermid   Reason for Consult: AMS  HPI:                                                                                                                                          Wanda [name] is an 64 y.o. female with history of RRMS and sees Dr. Krista Blue. Patietn was last seen normal Yesterday at around 1700 hours and noted to have speech difficulty by daughter this AM . She was brought to ED via EMS.  Patient currently still having Expressive>receptive Aphasia. She does not she had some of these issues during her last flair but not as bad. Patient is JC virus positive. Currently on Tysabri. Currently patient continues to show word finding difficulty and right leg weakness (old)  Past Medical History  Diagnosis Date  . Elevated cholesterol   . Hypertension   . Arthritis   . Cervical dysplasia   . Depression   . Anxiety   . Macular degeneration of right eye 2001  . MS (multiple sclerosis) ([place]) 08/2013    Past Surgical History  Procedure Laterality Date  . Colposcopy    . Knee surgery    . Cholecystectomy    . Tubal ligation    . Cortisone injections      Bil knees    Family History  Problem Relation Age of Onset  . Cancer Mother     Colon  . Diabetes Mother   . Hypertension Mother   . Arthritis Mother   . Cancer Father     prostate  . Kidney disease Brother     congenital single kidney  . Arthritis Sister   . Arthritis Sister   . Hematuria Son   . Gout Brother   . Multiple sclerosis Brother   . Arthritis Brother   . HIV Brother   . Cancer Brother     spinal     Social History:  reports that she quit smoking about 21 months ago. Her smoking use included Cigarettes. She has a 45 pack-year smoking history. She has never used smokeless tobacco. She reports that she does not drink alcohol or use illicit drugs.  No Known Allergies  MEDICATIONS:  Prior to Admission:  Facility-administered medications prior to admission  Medication Dose Route Frequency Provider Last Rate Last Dose  . loratadine (CLARITIN) tablet 10 mg  10 mg Oral Once Drema Dallas, DO      . methylPREDNISolone sodium succinate (SOLU-MEDROL) 500 mg in sodium chloride 0.9 % 50 mL IVPB  500 mg Intravenous PRN Drema Dallas, DO       Prescriptions prior to admission  Medication Sig Dispense Refill Last Dose  . atorvastatin (LIPITOR) 20 MG tablet take 1 tablet by mouth once daily 30 tablet 5 07/28/2015 at Unknown time  . BACLOFEN PO Take 70 mg by mouth daily. Patient is on a study drug with [facility]- Baclofen ER 40 MG with 30 MG with a total of 70 MG   07/28/2015 at Unknown time  . clonazePAM (KLONOPIN) 1 MG tablet take 1 tablet by mouth twice a day if needed for anxiety 30 tablet 5 07/28/2015 at Unknown time  . meloxicam (MOBIC) 15 MG tablet take 1 tablet by mouth once daily 90 tablet 3 07/28/2015 at Unknown time  . modafinil (PROVIGIL) 200 MG tablet Take 1 tablet (200 mg total) by mouth daily. 30 tablet 5 07/28/2015 at Unknown time  . Na Sulfate-K Sulfate-Mg Sulf SOLN Take 1 kit by mouth once. Per colonoscopy instruction 354 mL 0 07/28/2015 at Unknown time  . natalizumab (TYSABRI) 300 MG/15ML injection Inject 300 mg into the vein every 30 (thirty) days. 2nd of each month   07/07/2015  . Omega-3 Fatty Acids (FISH OIL PO) Take 1 tablet by mouth daily.   07/28/2015 at Unknown time  . traZODone (DESYREL) 50 MG tablet TAKE 1/2 TO 1 TABLET BY MOUTH AT BEDTIME IF NEEDED FOR SLEEP 30 tablet 0 07/28/2015 at Unknown time  . VITAMIN E PO Take 1 tablet by mouth daily.   07/28/2015 at Unknown time   Scheduled: . atorvastatin  20 mg Oral q1800  . baclofen  30 mg Oral Daily  . enoxaparin (LOVENOX) injection  40 mg Subcutaneous Q24H  . meloxicam  15 mg Oral Daily  . modafinil  200 mg Oral Daily  . traZODone  50 mg Oral  QHS     ROS:                                                                                                                                       History obtained from the patient  General ROS: negative for - chills, fatigue, fever, night sweats, weight gain or weight loss Psychological ROS: negative for - behavioral disorder, hallucinations, memory difficulties, mood swings or suicidal ideation Ophthalmic ROS: negative for - blurry vision, double vision, eye pain or loss of vision ENT ROS: negative for - epistaxis, nasal discharge, oral lesions, sore throat, tinnitus or vertigo Allergy and Immunology ROS: negative for - hives or itchy/watery eyes Hematological and Lymphatic ROS: negative for - bleeding problems, bruising or  swollen lymph nodes Endocrine ROS: negative for - galactorrhea, hair pattern changes, polydipsia/polyuria or temperature intolerance Respiratory ROS: negative for - cough, hemoptysis, shortness of breath or wheezing Cardiovascular ROS: negative for - chest pain, dyspnea on exertion, edema or irregular heartbeat Gastrointestinal ROS: negative for - abdominal pain, diarrhea, hematemesis, nausea/vomiting or stool incontinence Genito-Urinary ROS: negative for - dysuria, hematuria, incontinence or urinary frequency/urgency Musculoskeletal ROS: negative for - joint swelling or muscular weakness Neurological ROS: as noted in HPI Dermatological ROS: negative for rash and skin lesion changes   Blood pressure 117/44, pulse 56, temperature 98 F (36.7 C), temperature source Oral, resp. rate 20, height 5' (1.524 m), weight 55.9 kg (123 lb 3.8 oz), SpO2 100 %.   Neurologic Examination:                                                                                                      HEENT-  Normocephalic, no lesions, without obvious abnormality.  Normal external eye and conjunctiva.  Normal TM's bilaterally.  Normal auditory canals and external ears. Normal external nose, mucus  membranes and septum.  Normal pharynx. Cardiovascular- S1, S2 normal, pulses palpable throughout   Lungs- chest clear, no wheezing, rales, normal symmetric air entry Abdomen- normal findings: bowel sounds normal Extremities- no edema Lymph-no adenopathy palpable Musculoskeletal-no joint tenderness, deformity or swelling Skin-warm and dry, no hyperpigmentation, vitiligo, or suspicious lesions  Neurological Examination Mental Status: Alert, oriented, thought content appropriate.  Speech fluent with evidence of word finding difficulty.  Able to follow 3 step commands without difficulty. Cranial Nerves: II: Discs flat bilaterally; Visual fields grossly normal, pupils equal, round, reactive to light and accommodation III,IV, VI: ptosis not present, extra-ocular motions intact bilaterally V,VII: smile symmetric, facial light touch sensation normal bilaterally VIII: hearing normal bilaterally IX,X: uvula rises symmetrically XI: bilateral shoulder shrug XII: midline tongue extension Motor: Right : Upper extremity   5/5    Left:     Upper extremity   5/5  Lower extremity   61/5 (old)    Lower extremity   5/5 Tone and bulk:normal tone throughout; no atrophy noted Sensory: Pinprick and light touch intact throughout, bilaterally Deep Tendon Reflexes: 2+ and symmetric throughout UE 1+ in KJ bilaterally and no AJ Plantars: Right: downgoing   Left: downgoing Cerebellar: normal finger-to-nose,  and normal heel-to-shin test on the left Gait: not tested      Lab Results: Basic Metabolic Panel:  Recent Labs Lab 07/29/15 0938 07/29/15 1015  NA 146* 144  K 4.3 4.2  CL 112* 115*  CO2 22  --   GLUCOSE 114* 108*  BUN 14 18  CREATININE 0.86 0.90  CALCIUM 10.0  --     Liver Function Tests:  Recent Labs Lab 07/29/15 0938  AST 31  ALT 40  ALKPHOS 80  BILITOT 0.3  PROT 7.4  ALBUMIN 3.8   No results for input(s): LIPASE, AMYLASE in the last 168 hours. No results for input(s):  AMMONIA in the last 168 hours.  CBC:  Recent Labs Lab 07/29/15 0938 07/29/15 1015  WBC 7.1  --  NEUTROABS 3.5  --   HGB 13.0 15.0  HCT 40.2 44.0  MCV 87.2  --   PLT 119*  --     Cardiac Enzymes: No results for input(s): CKTOTAL, CKMB, CKMBINDEX, TROPONINI in the last 168 hours.  Lipid Panel: No results for input(s): CHOL, TRIG, HDL, CHOLHDL, VLDL, LDLCALC in the last 168 hours.  CBG:  Recent Labs Lab 07/29/15 1024  GLUCAP 94    Microbiology: Results for orders placed or performed in visit on 05/29/15  Microscopic Examination     Status: Abnormal   Collection Time: 05/29/15 12:00 AM  Result Value Ref Range Status   WBC, UA 11-30 (A) 0 -  5 /hpf Final   RBC, UA 0-2 0 -  2 /hpf Final   Epithelial Cells (non renal) None seen 0 - 10 /hpf Final   Casts None seen None seen /lpf Final   Bacteria, UA Few None seen/Few Final    Coagulation Studies: No results for input(s): LABPROT, INR in the last 72 hours.  Imaging: Ct Head Wo Contrast  07/29/2015  CLINICAL DATA:  Altered mental status.  Multiple sclerosis. EXAM: CT HEAD WITHOUT CONTRAST TECHNIQUE: Contiguous axial images were obtained from the base of the skull through the vertex without intravenous contrast. COMPARISON:  03/13/2015. FINDINGS: Diffusely enlarged ventricles and subarachnoid spaces. Patchy white matter low density in both cerebral hemispheres. Unremarkable bones and included paranasal sinuses. IMPRESSION: 1. No acute abnormality. 2. Stable atrophy. 3. Stable chronic white matter disease compatible with a combination of multiple sclerosis and chronic small vessel ischemic changes. Electronically Signed   By: Claudie Revering M.D.   On: 07/29/2015 10:26   Dg Chest Port 1 View  07/29/2015  CLINICAL DATA:  AMS Hypertension I10 MS (multiple sclerosis) (HCC) G35 Altered mental status 1 day EXAM: PORTABLE CHEST - 1 VIEW COMPARISON:  03/12/2015 FINDINGS: Heart size upper limits normal for technique. Ectatic/aneurysmal  thoracic aorta. Relatively low lung volumes. No confluent airspace disease. No overt edema. No pneumothorax. No effusion. Visualized skeletal structures are unremarkable. IMPRESSION: 1. Ectatic/aneurysmal thoracic aorta as before. No acute or superimposed abnormality. Electronically Signed   By: Lucrezia Europe M.D.   On: 07/29/2015 09:42       Assessment and plan per attending neurologist  Etta Quill PA-C [facility] [phone]  07/29/2015, 5:54 PM   Assessment/Plan: 64 YO female with known MS on Tysabri with low titers for JC virus.  Presenting to ED due to confusion and word finding difficulty.  Per patient previous flairs have had word finding difficulty. MRI with and without contrast and EEG pending. Exam is notable for word finding issues and old right leg weakness. At this time differential includes CVA versus MS flair versus but unlikely seizure.   Recommend: 1) MRI brain w/Wo contrast 2) EEG 3) primary team has ordered ammonia and TSH  Patient seen and evaluated. Agree with above assessment and plan. Would check MRI brain prior to initiating IV steroids.   Jim Like, DO [facility] [phone]  If 7pm- 7am, please page neurology on call as listed in [place].

## 2015-07-29 NOTE — ED Notes (Signed)
[facility]- pt coming from home with altered mental status. Pt was LSN by her daughter yesterday at 1700. Pt has history of MS, she also has history of similar episodes. She is alert on arrival, but non-verbal. CBG of 144, hypotensive and sinus brady with EMS.

## 2015-07-29 NOTE — Consult Note (Signed)
NEURO HOSPITALIST CONSULT NOTE   Referring physician: Mcdermid   Reason for Consult: AMS  HPI:                                                                                                                                          Wanda [name] is an 64 y.o. female with history of RRMS and sees Dr. Krista Blue. Patietn was last seen normal Yesterday at around 1700 hours and noted to have speech difficulty by daughter this AM . She was brought to ED via EMS.  Patient currently still having Expressive>receptive Aphasia. She does not she had some of these issues during her last flair but not as bad. Patient is JC virus positive. Currently on Tysabri. Currently patient continues to show word finding difficulty and right leg weakness (old)  Past Medical History  Diagnosis Date  . Elevated cholesterol   . Hypertension   . Arthritis   . Cervical dysplasia   . Depression   . Anxiety   . Macular degeneration of right eye 2001  . MS (multiple sclerosis) ([place]) 08/2013    Past Surgical History  Procedure Laterality Date  . Colposcopy    . Knee surgery    . Cholecystectomy    . Tubal ligation    . Cortisone injections      Bil knees    Family History  Problem Relation Age of Onset  . Cancer Mother     Colon  . Diabetes Mother   . Hypertension Mother   . Arthritis Mother   . Cancer Father     prostate  . Kidney disease Brother     congenital single kidney  . Arthritis Sister   . Arthritis Sister   . Hematuria Son   . Gout Brother   . Multiple sclerosis Brother   . Arthritis Brother   . HIV Brother   . Cancer Brother     spinal     Social History:  reports that she quit smoking about 21 months ago. Her smoking use included Cigarettes. She has a 45 pack-year smoking history. She has never used smokeless tobacco. She reports that she does not drink alcohol or use illicit drugs.  No Known Allergies  MEDICATIONS:  Prior to Admission:  Facility-administered medications prior to admission  Medication Dose Route Frequency Provider Last Rate Last Dose  . loratadine (CLARITIN) tablet 10 mg  10 mg Oral Once Drema Dallas, DO      . methylPREDNISolone sodium succinate (SOLU-MEDROL) 500 mg in sodium chloride 0.9 % 50 mL IVPB  500 mg Intravenous PRN Drema Dallas, DO       Prescriptions prior to admission  Medication Sig Dispense Refill Last Dose  . atorvastatin (LIPITOR) 20 MG tablet take 1 tablet by mouth once daily 30 tablet 5 07/28/2015 at Unknown time  . BACLOFEN PO Take 70 mg by mouth daily. Patient is on a study drug with [facility]- Baclofen ER 40 MG with 30 MG with a total of 70 MG   07/28/2015 at Unknown time  . clonazePAM (KLONOPIN) 1 MG tablet take 1 tablet by mouth twice a day if needed for anxiety 30 tablet 5 07/28/2015 at Unknown time  . meloxicam (MOBIC) 15 MG tablet take 1 tablet by mouth once daily 90 tablet 3 07/28/2015 at Unknown time  . modafinil (PROVIGIL) 200 MG tablet Take 1 tablet (200 mg total) by mouth daily. 30 tablet 5 07/28/2015 at Unknown time  . Na Sulfate-K Sulfate-Mg Sulf SOLN Take 1 kit by mouth once. Per colonoscopy instruction 354 mL 0 07/28/2015 at Unknown time  . natalizumab (TYSABRI) 300 MG/15ML injection Inject 300 mg into the vein every 30 (thirty) days. 2nd of each month   07/07/2015  . Omega-3 Fatty Acids (FISH OIL PO) Take 1 tablet by mouth daily.   07/28/2015 at Unknown time  . traZODone (DESYREL) 50 MG tablet TAKE 1/2 TO 1 TABLET BY MOUTH AT BEDTIME IF NEEDED FOR SLEEP 30 tablet 0 07/28/2015 at Unknown time  . VITAMIN E PO Take 1 tablet by mouth daily.   07/28/2015 at Unknown time   Scheduled: . atorvastatin  20 mg Oral q1800  . baclofen  30 mg Oral Daily  . enoxaparin (LOVENOX) injection  40 mg Subcutaneous Q24H  . meloxicam  15 mg Oral Daily  . modafinil  200 mg Oral Daily  . traZODone  50 mg Oral  QHS     ROS:                                                                                                                                       History obtained from the patient  General ROS: negative for - chills, fatigue, fever, night sweats, weight gain or weight loss Psychological ROS: negative for - behavioral disorder, hallucinations, memory difficulties, mood swings or suicidal ideation Ophthalmic ROS: negative for - blurry vision, double vision, eye pain or loss of vision ENT ROS: negative for - epistaxis, nasal discharge, oral lesions, sore throat, tinnitus or vertigo Allergy and Immunology ROS: negative for - hives or itchy/watery eyes Hematological and Lymphatic ROS: negative for - bleeding problems, bruising or  swollen lymph nodes Endocrine ROS: negative for - galactorrhea, hair pattern changes, polydipsia/polyuria or temperature intolerance Respiratory ROS: negative for - cough, hemoptysis, shortness of breath or wheezing Cardiovascular ROS: negative for - chest pain, dyspnea on exertion, edema or irregular heartbeat Gastrointestinal ROS: negative for - abdominal pain, diarrhea, hematemesis, nausea/vomiting or stool incontinence Genito-Urinary ROS: negative for - dysuria, hematuria, incontinence or urinary frequency/urgency Musculoskeletal ROS: negative for - joint swelling or muscular weakness Neurological ROS: as noted in HPI Dermatological ROS: negative for rash and skin lesion changes   Blood pressure 117/44, pulse 56, temperature 98 F (36.7 C), temperature source Oral, resp. rate 20, height 5' (1.524 m), weight 55.9 kg (123 lb 3.8 oz), SpO2 100 %.   Neurologic Examination:                                                                                                      HEENT-  Normocephalic, no lesions, without obvious abnormality.  Normal external eye and conjunctiva.  Normal TM's bilaterally.  Normal auditory canals and external ears. Normal external nose, mucus  membranes and septum.  Normal pharynx. Cardiovascular- S1, S2 normal, pulses palpable throughout   Lungs- chest clear, no wheezing, rales, normal symmetric air entry Abdomen- normal findings: bowel sounds normal Extremities- no edema Lymph-no adenopathy palpable Musculoskeletal-no joint tenderness, deformity or swelling Skin-warm and dry, no hyperpigmentation, vitiligo, or suspicious lesions  Neurological Examination Mental Status: Alert, oriented, thought content appropriate.  Speech fluent with evidence of word finding difficulty.  Able to follow 3 step commands without difficulty. Cranial Nerves: II: Discs flat bilaterally; Visual fields grossly normal, pupils equal, round, reactive to light and accommodation III,IV, VI: ptosis not present, extra-ocular motions intact bilaterally V,VII: smile symmetric, facial light touch sensation normal bilaterally VIII: hearing normal bilaterally IX,X: uvula rises symmetrically XI: bilateral shoulder shrug XII: midline tongue extension Motor: Right : Upper extremity   5/5    Left:     Upper extremity   5/5  Lower extremity   61/5 (old)    Lower extremity   5/5 Tone and bulk:normal tone throughout; no atrophy noted Sensory: Pinprick and light touch intact throughout, bilaterally Deep Tendon Reflexes: 2+ and symmetric throughout UE 1+ in KJ bilaterally and no AJ Plantars: Right: downgoing   Left: downgoing Cerebellar: normal finger-to-nose,  and normal heel-to-shin test on the left Gait: not tested      Lab Results: Basic Metabolic Panel:  Recent Labs Lab 07/29/15 0938 07/29/15 1015  NA 146* 144  K 4.3 4.2  CL 112* 115*  CO2 22  --   GLUCOSE 114* 108*  BUN 14 18  CREATININE 0.86 0.90  CALCIUM 10.0  --     Liver Function Tests:  Recent Labs Lab 07/29/15 0938  AST 31  ALT 40  ALKPHOS 80  BILITOT 0.3  PROT 7.4  ALBUMIN 3.8   No results for input(s): LIPASE, AMYLASE in the last 168 hours. No results for input(s):  AMMONIA in the last 168 hours.  CBC:  Recent Labs Lab 07/29/15 0938 07/29/15 1015  WBC 7.1  --  NEUTROABS 3.5  --   HGB 13.0 15.0  HCT 40.2 44.0  MCV 87.2  --   PLT 119*  --     Cardiac Enzymes: No results for input(s): CKTOTAL, CKMB, CKMBINDEX, TROPONINI in the last 168 hours.  Lipid Panel: No results for input(s): CHOL, TRIG, HDL, CHOLHDL, VLDL, LDLCALC in the last 168 hours.  CBG:  Recent Labs Lab 07/29/15 1024  GLUCAP 94    Microbiology: Results for orders placed or performed in visit on 05/29/15  Microscopic Examination     Status: Abnormal   Collection Time: 05/29/15 12:00 AM  Result Value Ref Range Status   WBC, UA 11-30 (A) 0 -  5 /hpf Final   RBC, UA 0-2 0 -  2 /hpf Final   Epithelial Cells (non renal) None seen 0 - 10 /hpf Final   Casts None seen None seen /lpf Final   Bacteria, UA Few None seen/Few Final    Coagulation Studies: No results for input(s): LABPROT, INR in the last 72 hours.  Imaging: Ct Head Wo Contrast  07/29/2015  CLINICAL DATA:  Altered mental status.  Multiple sclerosis. EXAM: CT HEAD WITHOUT CONTRAST TECHNIQUE: Contiguous axial images were obtained from the base of the skull through the vertex without intravenous contrast. COMPARISON:  03/13/2015. FINDINGS: Diffusely enlarged ventricles and subarachnoid spaces. Patchy white matter low density in both cerebral hemispheres. Unremarkable bones and included paranasal sinuses. IMPRESSION: 1. No acute abnormality. 2. Stable atrophy. 3. Stable chronic white matter disease compatible with a combination of multiple sclerosis and chronic small vessel ischemic changes. Electronically Signed   By: Claudie Revering M.D.   On: 07/29/2015 10:26   Dg Chest Port 1 View  07/29/2015  CLINICAL DATA:  AMS Hypertension I10 MS (multiple sclerosis) (HCC) G35 Altered mental status 1 day EXAM: PORTABLE CHEST - 1 VIEW COMPARISON:  03/12/2015 FINDINGS: Heart size upper limits normal for technique. Ectatic/aneurysmal  thoracic aorta. Relatively low lung volumes. No confluent airspace disease. No overt edema. No pneumothorax. No effusion. Visualized skeletal structures are unremarkable. IMPRESSION: 1. Ectatic/aneurysmal thoracic aorta as before. No acute or superimposed abnormality. Electronically Signed   By: Lucrezia Europe M.D.   On: 07/29/2015 09:42       Assessment and plan per attending neurologist  Etta Quill PA-C [facility] [phone]  07/29/2015, 4:24 PM   Assessment/Plan: 64 YO female with known MS on Tysabri with low titers for JC virus.  Presenting to ED due to confusion and word finding difficulty.  Per patient previous flairs have had word finding difficulty. MRI with and without contrast and EEG pending. Exam is notable for word finding issues and old right leg weakness. At this time differential includes CVA versus MS flair versus but unlikely seizure.   Recommend: 1) MRI brain w/Wo contrast 2) EEG 3) primary team has ordered ammonia and TSH

## 2015-07-29 NOTE — ED Notes (Signed)
Attempted report 

## 2015-07-29 NOTE — ED Provider Notes (Signed)
CSN: [id number]     Arrival date & time 07/29/15  0825 History   First MD Initiated Contact with Patient 07/29/15 0831     Chief Complaint  Patient presents with  . Altered Mental Status     The history is provided by the EMS personnel. No language interpreter was used.   [name] presents for evaluation of altered mental status. Level V caveat due to confusion. History is provided by EMS. She was last seen normal at 5 PM last night. She has a history of MS. No recent illnesses. She is normally verbal and ambulatory at baseline. No known recent injuries.  Past Medical History  Diagnosis Date  . Elevated cholesterol   . Hypertension   . Arthritis   . Cervical dysplasia   . Depression   . Anxiety   . Macular degeneration of right eye 2001  . MS (multiple sclerosis) ([place]) 08/2013   Past Surgical History  Procedure Laterality Date  . Colposcopy    . Knee surgery    . Cholecystectomy    . Tubal ligation    . Cortisone injections      Bil knees   Family History  Problem Relation Age of Onset  . Cancer Mother     Colon  . Diabetes Mother   . Hypertension Mother   . Arthritis Mother   . Cancer Father     prostate  . Kidney disease Brother     congenital single kidney  . Arthritis Sister   . Arthritis Sister   . Hematuria Son   . Gout Brother   . Multiple sclerosis Brother   . Arthritis Brother   . HIV Brother   . Cancer Brother     spinal   Social History  Substance Use Topics  . Smoking status: Former Smoker -- 1.00 packs/day for 45 years    Types: Cigarettes    Quit date: 10/23/2013  . Smokeless tobacco: Never Used     Comment: Quit in 2014.  Marland Kitchen Alcohol Use: No     Comment: hasn't drank anything since jan 2015   OB History    Gravida Para Term Preterm AB TAB SAB Ectopic Multiple Living   6 3 3  3  3   2      Review of Systems  Unable to perform ROS: Patient nonverbal      Allergies  Review of patient's allergies indicates no known allergies.  Home  Medications   Prior to Admission medications   Medication Sig Start Date End Date Taking? Authorizing Provider  atorvastatin (LIPITOR) 20 MG tablet take 1 tablet by mouth once daily 05/28/14   [name] Jeffery, PA-C  clonazePAM (KLONOPIN) 1 MG tablet take 1 tablet by mouth twice a day if needed for anxiety 05/27/15   Marcial Pacas, MD  Cyanocobalamin (B-12 PO) Take 1 tablet by mouth daily.    Historical Provider, MD  ibuprofen (ADVIL,MOTRIN) 200 MG tablet Take 1,000 mg by mouth daily as needed for mild pain or moderate pain.     Historical Provider, MD  meloxicam (MOBIC) 15 MG tablet take 1 tablet by mouth once daily 06/12/15   Marcial Pacas, MD  modafinil (PROVIGIL) 200 MG tablet Take 1 tablet (200 mg total) by mouth daily. 02/26/15   Marcial Pacas, MD  Na Sulfate-K Sulfate-Mg Sulf SOLN Take 1 kit by mouth once. Per colonoscopy instruction 07/15/15 08/14/15  Amy S Esterwood, PA-C  nitrofurantoin, macrocrystal-monohydrate, (MACROBID) 100 MG capsule Take 1 capsule (100 mg total)  by mouth 2 (two) times daily. 06/04/15   Marcial Pacas, MD  Omega-3 Fatty Acids (FISH OIL PO) Take 1 tablet by mouth daily.    Historical Provider, MD  traZODone (DESYREL) 50 MG tablet TAKE 1/2 TO 1 TABLET BY MOUTH AT BEDTIME IF NEEDED FOR SLEEP 07/01/15   [name] Jeffery, PA-C  VITAMIN E PO Take 1 tablet by mouth daily.    Historical Provider, MD   BP 102/59 mmHg  Pulse 56  Temp(Src) 97.4 F (36.3 C) (Oral)  Resp 12  SpO2 100% Physical Exam  Constitutional: She appears well-developed and well-nourished.  HENT:  Head: Normocephalic and atraumatic.  Eyes:  Pupils mid size and sluggishly reactive bilaterally  Cardiovascular: Normal rate and regular rhythm.   No murmur heard. Pulmonary/Chest: Effort normal and breath sounds normal. No respiratory distress.  Abdominal: Soft. There is no tenderness. There is no rebound and no guarding.  Musculoskeletal: She exhibits no edema or tenderness.  Neurological:  Nonverbal. Patient is awake and  prefers to look to the left. She will mumble at times what sounds like "yes" but she does not say any other words. 3 out of 5 strength in the right upper extremity. 2 out of 5 strength in the left upper extremity. 0 out of 5 strength in bilateral lower extremities.  Skin: Skin is warm and dry.  Psychiatric: She has a normal mood and affect. Her behavior is normal.  Nursing note and vitals reviewed.   ED Course  Procedures (including critical care time) Labs Review Labs Reviewed  CBC - Abnormal; Notable for the following:    RDW 16.1 (*)    Platelets 119 (*)    All other components within normal limits  COMPREHENSIVE METABOLIC PANEL - Abnormal; Notable for the following:    Sodium 146 (*)    Chloride 112 (*)    Glucose, Bld 114 (*)    All other components within normal limits  URINE RAPID DRUG SCREEN, HOSP PERFORMED - Abnormal; Notable for the following:    Benzodiazepines POSITIVE (*)    All other components within normal limits  URINALYSIS, ROUTINE W REFLEX MICROSCOPIC (NOT AT [facility]) - Abnormal; Notable for the following:    Leukocytes, UA TRACE (*)    All other components within normal limits  URINE MICROSCOPIC-ADD ON - Abnormal; Notable for the following:    Casts HYALINE CASTS (*)    All other components within normal limits  I-STAT CHEM 8, ED - Abnormal; Notable for the following:    Chloride 115 (*)    Glucose, Bld 108 (*)    All other components within normal limits  I-STAT CG4 LACTIC ACID, ED - Abnormal; Notable for the following:    Lactic Acid, Venous 2.24 (*)    All other components within normal limits  URINE CULTURE  CULTURE, BLOOD (ROUTINE X 2)  CULTURE, BLOOD (ROUTINE X 2)  ETHANOL  DIFFERENTIAL  CK  CBC  I-STAT TROPOININ, ED  CBG MONITORING, ED    Imaging Review Ct Head Wo Contrast  07/29/2015  CLINICAL DATA:  Altered mental status.  Multiple sclerosis. EXAM: CT HEAD WITHOUT CONTRAST TECHNIQUE: Contiguous axial images were obtained from the base of the  skull through the vertex without intravenous contrast. COMPARISON:  03/13/2015. FINDINGS: Diffusely enlarged ventricles and subarachnoid spaces. Patchy white matter low density in both cerebral hemispheres. Unremarkable bones and included paranasal sinuses. IMPRESSION: 1. No acute abnormality. 2. Stable atrophy. 3. Stable chronic white matter disease compatible with a combination of multiple sclerosis  and chronic small vessel ischemic changes. Electronically Signed   By: Claudie Revering M.D.   On: 07/29/2015 10:26   Dg Chest Port 1 View  07/29/2015  CLINICAL DATA:  AMS Hypertension I10 MS (multiple sclerosis) (HCC) G35 Altered mental status 1 day EXAM: PORTABLE CHEST - 1 VIEW COMPARISON:  03/12/2015 FINDINGS: Heart size upper limits normal for technique. Ectatic/aneurysmal thoracic aorta. Relatively low lung volumes. No confluent airspace disease. No overt edema. No pneumothorax. No effusion. Visualized skeletal structures are unremarkable. IMPRESSION: 1. Ectatic/aneurysmal thoracic aorta as before. No acute or superimposed abnormality. Electronically Signed   By: Lucrezia Europe M.D.   On: 07/29/2015 09:42   I have personally reviewed and evaluated these images and lab results as part of my medical decision-making.  ED ECG REPORT   Date: 07/29/2015  Rate: 49  Rhythm: sinus bradycardia  QRS Axis: normal  Intervals: normal  ST/T Wave abnormalities: early repolarization  Conduction Disutrbances:none  Narrative Interpretation:   Old EKG Reviewed: none available  I have personally reviewed the EKG tracing and agree with the computerized printout as noted.   MDM   Final diagnoses:  MS (multiple sclerosis) ([place])    Pt with hx/o MS here for evaluation of AMS.  Pt with generalized weakness, greater on LUE, nonverbal on initial eval.  On repeat evaluation in ED pt with partial improvement - able to speak in short phrases, continues to be weak.  Pt initially hypothermic, warming blanket applied, appears  mildly dehydrated - providing IVF.  No evidence of acute infectious process or acute CVA.  Family medicine consulted for admission for AMS.      Quintella Reichert, MD 07/29/15 [phone]

## 2015-07-29 NOTE — H&P (Signed)
[facility] Admission History and Physical Service Pager: [phone]  Patient name: Wanda [name] Medical record number: [id number] Date of birth: 10-27-1950 Age: 64 y.o. Gender: female  Primary Care Provider: JEFFERY,[name], PA-C Consultants: Neurology  Code Status: Full   Chief Complaint: Altered Mental Status  Assessment and Plan: Wanda [name] is a 64 y.o. female presenting with altered mental status. PMH is significant for RRMS, HLD. HTN, Arthritis   #Altered Mental Status: Patient with hx of RRMS presenting with altered mental status, similar to previous episodes in the past per daughter (with last hospitalization in June 2016). Patient orientated to self, place, however not orientated to time (month or year).  When asked if patient knew the year, patient kept stating that it was Monday. Patient has been afebrile and HR 56-62. Last MRI on 03/2015 showing an 6 mm T1 intrinsically hyperintense nodule with apparent fluid debris level on T2 weighted imaging, located along the ependymal margin of the left lateral ventricle atrium. Electrolytes wnl. WBC 7.1. Lactic Acid 2.24. EKG showing slight ST elevation in I, II, V4- V6. Troponin 0.0. UDS negative except for Benzos. UA was positive only for trace leukocytes. Patient without hx of seizures, however would consider seizure in setting of neurologic deficients. Also less likely, will consider other forms of encephalopathy, most like altered due to patient's mental status. -  Admit to med-surg, attending Dr. Wendy Poet. Unlikely infectious. Concern for MS flare. EKG with slight changes. Continue to monitor neuro status.   -  Will get AM CBC and BMP  -  Repeat EKG, will consider trending troponin  - Will get Ammonia, TSH  - Will consult neurology: Appreciate recs.  - Will get EEG and MRI Brain - Will continue to trend lactic acid x 3, will continue to trend - Will continue NS @ 100 ml per hour  - Will get CK, to  determine if seizure activity  - Patient received  #MS: Patient with hx of MS. She has a hx of right leg weakness per daughter, and uses a cane per daughter. Because patient has active form of RRMS, will start with Tysarbri IV infusion (January 29 2014).  - Will continue modafinil  - Will continue baclofen  - Appreciate neuro recs.   #HLD: LDL 46 HDL 45  - Patient to continue Lipitor 40 mg   #Hx of HTN: BP 117/44  - Will continue to monitor   FEN/GI: Carb Modified, NS@ 100 ml per hr Prophylaxis: Lovenox  Disposition: Med-surg  History of Present Illness:  Wanda [name] is a 64 y.o. female presenting with altered mental status.Per daughter patient is usually very coherent. However daughter noticed that this morning, she went into bathroom with patient. Patient started sweating perfusely and seemed to be in a semi-conscious state. Per daughter, she has had similar previous episodes including a similar episode in June of this year. Per patient she states that she feels much better than this morning.  Per visit to Neurologist in June patient with cognition, patient was orientated to person, place, and time with recent and remote memory intact; normal attention, concentration, fund of knowledge.  Review Of Systems: Per HPI with the following additions: None  Otherwise 12 point review of systems was performed and was unremarkable.  Patient Active Problem List   Diagnosis Date Noted  . Altered mental status 07/29/2015  . Dehydration   . Hypokalemia   . Altered level of consciousness 03/12/2015  . Acute encephalopathy 03/12/2015  .  Gait difficulty 08/10/2014  . Multiple sclerosis ([place]) 10/25/2013  . Insomnia 03/23/2013  . Fibroids 12/29/2012  . Elevated cholesterol   . Hypertension   . Arthritis   . Cervical dysplasia    Past Medical History: Past Medical History  Diagnosis Date  . Elevated cholesterol   . Hypertension   . Arthritis   . Cervical dysplasia   . Depression   .  Anxiety   . Macular degeneration of right eye 2001  . MS (multiple sclerosis) ([place]) 08/2013   Past Surgical History: Past Surgical History  Procedure Laterality Date  . Colposcopy    . Knee surgery    . Cholecystectomy    . Tubal ligation    . Cortisone injections      Bil knees   Social History: Social History  Substance Use Topics  . Smoking status: Former Smoker -- 1.00 packs/day for 45 years    Types: Cigarettes    Quit date: 10/23/2013  . Smokeless tobacco: Never Used     Comment: Quit in 2014.  Marland Kitchen Alcohol Use: No     Comment: hasn't drank anything since jan 2015   Additional social history: None Please also refer to relevant sections of EMR.  Family History: Family History  Problem Relation Age of Onset  . Cancer Mother     Colon  . Diabetes Mother   . Hypertension Mother   . Arthritis Mother   . Cancer Father     prostate  . Kidney disease Brother     congenital single kidney  . Arthritis Sister   . Arthritis Sister   . Hematuria Son   . Gout Brother   . Multiple sclerosis Brother   . Arthritis Brother   . HIV Brother   . Cancer Brother     spinal   Allergies and Medications: No Known Allergies No current facility-administered medications on file prior to encounter.   Current Outpatient Prescriptions on File Prior to Encounter  Medication Sig Dispense Refill  . atorvastatin (LIPITOR) 20 MG tablet take 1 tablet by mouth once daily 30 tablet 5  . clonazePAM (KLONOPIN) 1 MG tablet take 1 tablet by mouth twice a day if needed for anxiety 30 tablet 5  . meloxicam (MOBIC) 15 MG tablet take 1 tablet by mouth once daily 90 tablet 3  . modafinil (PROVIGIL) 200 MG tablet Take 1 tablet (200 mg total) by mouth daily. 30 tablet 5  . Na Sulfate-K Sulfate-Mg Sulf SOLN Take 1 kit by mouth once. Per colonoscopy instruction 354 mL 0  . Omega-3 Fatty Acids (FISH OIL PO) Take 1 tablet by mouth daily.    . traZODone (DESYREL) 50 MG tablet TAKE 1/2 TO 1 TABLET BY MOUTH  AT BEDTIME IF NEEDED FOR SLEEP 30 tablet 0  . VITAMIN E PO Take 1 tablet by mouth daily.      Objective: BP 117/44 mmHg  Pulse 56  Temp(Src) 98 F (36.7 C) (Oral)  Resp 20  Ht 5' (1.524 m)  Wt 123 lb 3.8 oz (55.9 kg)  BMI 24.07 kg/m2  SpO2 100% Exam: General: Patient with NAD, lying in bed.  Eyes: Pupils Equal Round Reactive to light, Extraocular movements intact, Fundi without hemorrhage or visible lesions, Conjunctiva without redness or discharge ENTM: Mucosa membranes intact  Neck: No lymphadenopathy, normal ROM Cardiovascular: RRR, no murmurs, no rubs, no gallops Respiratory: Normal respiratory effort, chest expands symmetrically. Lungs are clear to auscultation, no crackles or wheezes. Abdomen: BS+, Non ttp, no rebound,  MSK: no lower extremity edema  Skin: no sores or suspicious lesions or rashes or color changes Neuro: Cn 2-7 intact, decreased strength in right leg (3/5), rest of extremities with normal strength in the rest of limbs, normal sensation throughout Psych: Alert to self and place, not time,   Labs and Imaging: CBC BMET   Recent Labs Lab 07/29/15 0938 07/29/15 1015  WBC 7.1  --   HGB 13.0 15.0  HCT 40.2 44.0  PLT 119*  --     Recent Labs Lab 07/29/15 0938 07/29/15 1015  NA 146* 144  K 4.3 4.2  CL 112* 115*  CO2 22  --   BUN 14 18  CREATININE 0.86 0.90  GLUCOSE 114* 108*  CALCIUM 10.0  --      EKG: Slight ST elevation in I, II, and V4-V6  Lactic Acid 2.24   Ct Head Wo Contrast  07/29/2015  CLINICAL DATA:  Altered mental status.  Multiple sclerosis. EXAM: CT HEAD WITHOUT CONTRAST TECHNIQUE: Contiguous axial images were obtained from the base of the skull through the vertex without intravenous contrast. COMPARISON:  03/13/2015. FINDINGS: Diffusely enlarged ventricles and subarachnoid spaces. Patchy white matter low density in both cerebral hemispheres. Unremarkable bones and included paranasal sinuses. IMPRESSION: 1. No acute abnormality. 2.  Stable atrophy. 3. Stable chronic white matter disease compatible with a combination of multiple sclerosis and chronic small vessel ischemic changes. Electronically Signed   By: Claudie Revering M.D.   On: 07/29/2015 10:26   Dg Chest Port 1 View  07/29/2015  CLINICAL DATA:  AMS Hypertension I10 MS (multiple sclerosis) (HCC) G35 Altered mental status 1 day EXAM: PORTABLE CHEST - 1 VIEW COMPARISON:  03/12/2015 FINDINGS: Heart size upper limits normal for technique. Ectatic/aneurysmal thoracic aorta. Relatively low lung volumes. No confluent airspace disease. No overt edema. No pneumothorax. No effusion. Visualized skeletal structures are unremarkable. IMPRESSION: 1. Ectatic/aneurysmal thoracic aorta as before. No acute or superimposed abnormality. Electronically Signed   By: Lucrezia Europe M.D.   On: 07/29/2015 09:42     Asiyah Cletis Media, MD 07/29/2015, 4:50 PM PGY-1, [place] Intern pager: [phone], text pages welcome  I agree with the above evaluation, assessment, and plan. Any correctional changes can be noted in [facility].   Aquilla Hacker, MD Family Medicine Resident - PGY 2

## 2015-07-30 ENCOUNTER — Inpatient Hospital Stay (HOSPITAL_COMMUNITY): Payer: Federal, State, Local not specified - PPO

## 2015-07-30 DIAGNOSIS — R402413 Glasgow coma scale score 13-15, at hospital admission: Secondary | ICD-10-CM

## 2015-07-30 DIAGNOSIS — R4182 Altered mental status, unspecified: Secondary | ICD-10-CM | POA: Diagnosis not present

## 2015-07-30 DIAGNOSIS — G35 Multiple sclerosis: Secondary | ICD-10-CM | POA: Diagnosis not present

## 2015-07-30 DIAGNOSIS — R410 Disorientation, unspecified: Secondary | ICD-10-CM | POA: Diagnosis not present

## 2015-07-30 DIAGNOSIS — D649 Anemia, unspecified: Secondary | ICD-10-CM | POA: Diagnosis not present

## 2015-07-30 LAB — CBC
HEMATOCRIT: 31.8 % — AB (ref 36.0–46.0)
HEMOGLOBIN: 10.1 g/dL — AB (ref 12.0–15.0)
MCH: 27.4 pg (ref 26.0–34.0)
MCHC: 31.8 g/dL (ref 30.0–36.0)
MCV: 86.4 fL (ref 78.0–100.0)
Platelets: 100 10*3/uL — ABNORMAL LOW (ref 150–400)
RBC: 3.68 MIL/uL — ABNORMAL LOW (ref 3.87–5.11)
RDW: 16 % — AB (ref 11.5–15.5)
WBC: 5.2 10*3/uL (ref 4.0–10.5)

## 2015-07-30 LAB — COMPREHENSIVE METABOLIC PANEL
ALBUMIN: 2.6 g/dL — AB (ref 3.5–5.0)
ALK PHOS: 59 U/L (ref 38–126)
ALT: 29 U/L (ref 14–54)
ANION GAP: 5 (ref 5–15)
AST: 17 U/L (ref 15–41)
BILIRUBIN TOTAL: 0.4 mg/dL (ref 0.3–1.2)
BUN: 14 mg/dL (ref 6–20)
CALCIUM: 8.4 mg/dL — AB (ref 8.9–10.3)
CO2: 21 mmol/L — AB (ref 22–32)
Chloride: 113 mmol/L — ABNORMAL HIGH (ref 101–111)
Creatinine, Ser: 0.85 mg/dL (ref 0.44–1.00)
GFR calc Af Amer: >60 mL/min (ref >60)
GFR calc non Af Amer: >60 mL/min (ref >60)
GLUCOSE: 94 mg/dL (ref 65–99)
Potassium: 3.8 mmol/L (ref 3.5–5.1)
SODIUM: 139 mmol/L (ref 135–145)
TOTAL PROTEIN: 5.2 g/dL — AB (ref 6.5–8.1)

## 2015-07-30 LAB — URINE CULTURE: Culture: NO GROWTH

## 2015-07-30 LAB — LACTIC ACID, PLASMA: Lactic Acid, Venous: 0.6 mmol/L (ref 0.5–2.0)

## 2015-07-30 NOTE — Care Management Note (Signed)
Case Management Note  Patient Details  Name: Wanda [name] MRN: [id number] Date of Birth: 01/27/1951  Subjective/Objective:   Date:07/30/15 Spoke with patient at the bedside. Introduced self as Tourist information centre manager and explained role in discharge planning and how to be reached. Verified patient lives in town, alone.  Has DME cane  And rolling walker. Expressed potential need for no other DME. Verified patient anticipates to go home with alone at time of discharge and will have  part-time supervision by daughter at this time to best of their knowledge. Patient denied needing help with their medication. Patient is driven by scat to MD appointments. Verified patient has PCP Dellis Filbert.  Patient states [name] ,her daughter is working on getting her medicaid so she can apply for pcs services thru her pcp.  Plan: CM will continue to follow for discharge planning and [facility] resources.                  Action/Plan:   Expected Discharge Date:                  Expected Discharge Plan:  [place]  In-House Referral:     Discharge planning Services  CM Consult  Post Acute Care Choice:    Choice offered to:     DME Arranged:    DME Agency:     HH Arranged:    [place] Agency:     Status of Service:  Completed, signed off  Medicare Important Message Given:    Date Medicare IM Given:    Medicare IM give by:    Date Additional Medicare IM Given:    Additional Medicare Important Message give by:     If discussed at [place] of Stay Meetings, dates discussed:    Additional Comments:  Zenon Mayo, RN 07/30/2015, 4:12 PM

## 2015-07-30 NOTE — Progress Notes (Signed)
Family Medicine Teaching Service Daily Progress Note Intern Pager: [phone]  Patient name: Wanda [name] Medical record number: [id number] Date of birth: Aug 30, 1951 Age: 64 y.o. Gender: female  Primary Care Provider: JEFFERY,[name], PA-C Consultants: None  Code Status: Full   Pt Overview and Major Events to Date:   Assessment and Plan: Wanda [name] is a 64 y.o. female presenting with altered mental status. PMH is significant for RRMS, HLD. HTN, Arthritis   #Altered Mental Status: Mental status much improved orientated to self, place, and time. Was able to discuss with me the candidates for election this year etc. Patient with hx of RRMS presenting with altered mental status, similar to previous episodes in the past per daughter (with last hospitalization in June 2016). Patient orientated to self, place, however not orientated to time (month or year). When asked if patient knew the year, patient kept stating that it was Monday. Patient has been afebrile and HR 56-62. Last MRI on 03/2015 showing an 6 mm T1 intrinsically hyperintense nodule with apparent fluid debris level on T2 weighted imaging, located along the ependymal margin of the left lateral ventricle atrium. Electrolytes wnl. WBC 7.1. Lactic Acid 2.24. EKG showing slight ST elevation in I, II, V4- V6. Troponin 0.0. UDS negative except for Benzos. UA was positive only for trace leukocytes. Patient without hx of seizures, however would consider seizure in setting of neurologic deficients. Also less likely, will consider other forms of encephalopathy, most like altered due to patient's mental status. - Repeat EKG was not found to have ST depression/elevation  -Will get AM CBC and BMP  - Will get Ammonia, TSH, CK were wnl  -  EEG and MRI Brain found wnl  -  Lactic acid 1.0>0.6 -  Will discharge today   #MS: Patient with hx of MS. She has a hx of right leg weakness per daughter, and uses a cane per daughter. Because patient has active  form of RRMS, will start with Tysarbri IV infusion (January 29 2014).  - Will continue modafinil  - Will continue baclofen  - Appreciate neuro recs.   #Anemia: Hgb 10.1, Admission 13. Baseline hgb around 12. On 07/2015 Iron was normal. - Patient on Tysarbi which can decrease cell lines  - Will get a CBC in the AM   #HLD: LDL 46 HDL 45  - Patient to continue Lipitor 40 mg   #Hx of HTN: BP 117/44  - Will continue to monitor   FEN/GI: Carb Modified, NS@ 100 ml per hr Prophylaxis: Lovenox  Disposition: Med-surg  Subjective:  Patient doing well this AM.   Objective: Temp:  [97.4 F (36.3 C)-98.6 F (37 C)] 98.2 F (36.8 C) (10/25 0641) Pulse Rate:  [50-69] 62 (10/25 0641) Resp:  [12-20] 18 (10/25 0641) BP: (88-117)/(44-77) 101/56 mmHg (10/25 0641) SpO2:  [96 %-100 %] 100 % (10/25 0641) Weight:  [123 lb 3.8 oz (55.9 kg)] 123 lb 3.8 oz (55.9 kg) (10/24 1301) Physical Exam: General: Patient with NAD, lying in bed.  Cardiovascular: RRR, no murmurs, no rubs, no gallops Respiratory: Normal respiratory effort, chest expands symmetrically. Lungs are clear to auscultation, no crackles or wheezes. Abdomen: BS+, Non ttp, no rebound, Neuro: decreased strength in right leg (3/5), rest of extremities with normal strength in the rest of limbs, normal sensation throughout  Laboratory:  Recent Labs Lab 07/29/15 0938 07/29/15 1015 07/29/15 2011 07/30/15 0700  WBC 7.1  --  7.6 5.2  HGB 13.0 15.0 10.6* 10.1*  HCT 40.2 44.0 32.1*  31.8*  PLT 119*  --  145* PENDING    Recent Labs Lab 07/29/15 0938 07/29/15 1015 07/29/15 2011 07/30/15 0700  NA 146* 144 140 139  K 4.3 4.2 4.2 3.8  CL 112* 115* 112* 113*  CO2 22  --  22 21*  BUN 14 18 18 14   CREATININE 0.86 0.90 0.95 0.85  CALCIUM 10.0  --  8.6* 8.4*  PROT 7.4  --   --  5.2*  BILITOT 0.3  --   --  0.4  ALKPHOS 80  --   --  59  ALT 40  --   --  29  AST 31  --   --  17  GLUCOSE 114* 108* 107* 94     Imaging/Diagnostic  Tests:  Ct Head Wo Contrast  07/29/2015  CLINICAL DATA:  Altered mental status.  Multiple sclerosis. EXAM: CT HEAD WITHOUT CONTRAST TECHNIQUE: Contiguous axial images were obtained from the base of the skull through the vertex without intravenous contrast. COMPARISON:  03/13/2015. FINDINGS: Diffusely enlarged ventricles and subarachnoid spaces. Patchy white matter low density in both cerebral hemispheres. Unremarkable bones and included paranasal sinuses. IMPRESSION: 1. No acute abnormality. 2. Stable atrophy. 3. Stable chronic white matter disease compatible with a combination of multiple sclerosis and chronic small vessel ischemic changes. Electronically Signed   By: Claudie Revering M.D.   On: 07/29/2015 10:26   Mr Jeri Cos GD Contrast  07/29/2015  CLINICAL DATA:  64 year old female with chronic multiple sclerosis. Acute onset speech difficulty this morning. JC virus positive. Currently on Tysabri. Initial encounter. EXAM: MRI HEAD WITHOUT AND WITH CONTRAST TECHNIQUE: Multiplanar, multiecho pulse sequences of the brain and surrounding structures were obtained without and with intravenous contrast. CONTRAST:  49mL MULTIHANCE GADOBENATE DIMEGLUMINE 529 MG/ML IV SOLN COMPARISON:  Head CT without contrast 0959 hours today. Brain MRI 03/13/2015. FINDINGS: No restricted diffusion or evidence of acute infarction. Major intracranial vascular flow voids are stable and within normal limits. No intracranial mass effect or ventriculomegaly. No acute intracranial hemorrhage identified. Chronic small left lateral ventricle atrium round subependymal collection with fluid level on FLAIR imaging (series 7, image 14) and hemosiderin ring. This demonstrates diffusion restriction (series 4, image 26). This is subtle and hypodense by head CT. No associated mass effect. No similar lesion identified elsewhere. No other chronic cerebral blood products. Confluent cerebral white matter T2 and FLAIR hyperintensity with configuration  suggesting chronic demyelinating disease including temporal lobe involvement and chronic corpus callosum volume loss. Involvement of the deep gray matter nuclei and deep white matter capsules appears stable. Patchy T2 hyperintensity in the pons is stable. Sparing of the cerebellum. Sparing of the cervicomedullary junction. No restricted or enhancing lesion identified. Visible internal auditory structures appear normal. Mastoids and paranasal sinuses are clear. Orbits soft tissues appear stable. Negative scalp soft tissues. IMPRESSION: 1. No acute intracranial abnormality. Chronic demyelinating disease appears stable since June with no areas of acute demyelination identified. 2. Small, unusual hemorrhagic subependymal cyst / lesion adjacent to the left atrium is unchanged since June. Electronically Signed   By: Genevie Ann M.D.   On: 07/29/2015 20:01   Dg Chest Port 1 View  07/29/2015  CLINICAL DATA:  AMS Hypertension I10 MS (multiple sclerosis) (HCC) G35 Altered mental status 1 day EXAM: PORTABLE CHEST - 1 VIEW COMPARISON:  03/12/2015 FINDINGS: Heart size upper limits normal for technique. Ectatic/aneurysmal thoracic aorta. Relatively low lung volumes. No confluent airspace disease. No overt edema. No pneumothorax. No effusion. Visualized skeletal structures  are unremarkable. IMPRESSION: 1. Ectatic/aneurysmal thoracic aorta as before. No acute or superimposed abnormality. Electronically Signed   By: Lucrezia Europe M.D.   On: 07/29/2015 09:42    Jordanny Waddington Cletis Media, MD 07/30/2015, 8:08 AM PGY-1, [place] Intern pager: [phone], text pages welcome

## 2015-07-30 NOTE — Progress Notes (Signed)
Wanda [name] to be D/C'd Home per MD order.  Discussed with the patient and all questions fully answered.  VSS, Skin clean, dry and intact without evidence of skin break down, no evidence of skin tears noted. IV catheter discontinued intact. Site without signs and symptoms of complications. Dressing and pressure applied.  An After Visit Summary was printed and given to the patient. Patient received prescription.  D/c education completed with patient/family including follow up instructions, medication list, d/c activities limitations if indicated, with other d/c instructions as indicated by MD - patient able to verbalize understanding, all questions fully answered.   Patient instructed to return to ED, call 911, or call MD for any changes in condition.   Patient escorted via [place], and D/C home via private auto.  Malcolm Metro, RN 07/30/2015 6:42 PM

## 2015-07-30 NOTE — Procedures (Signed)
ELECTROENCEPHALOGRAM REPORT   Patient: Wanda [name]      Room #: 4O-27 Age: 64 y.o.        Sex: female Referring Physician: Dr McDiarmid Report Date:  07/30/2015        Interpreting Physician: Hulen Luster  History: [name] is an 64 y.o. female hx of MS admitted with AMS  Medications:  Scheduled: . atorvastatin  20 mg Oral q1800  . baclofen  30 mg Oral Daily  . enoxaparin (LOVENOX) injection  40 mg Subcutaneous Q24H  . meloxicam  15 mg Oral Daily  . modafinil  200 mg Oral Daily  . traZODone  50 mg Oral QHS    Conditions of Recording:  This is a 16 channel EEG carried out with the patient in the awake state.  Description:  The waking background activity consists of a low voltage, symmetrical, fairly well organized, 8-10 Hz alpha activity, seen from the parieto-occipital and posterior temporal regions.  No focal slowing or epileptiform activity noted.   Hyperventilation was not performed. Intermittent photic stimulation was performed but failed to illicit any change in the tracing.   IMPRESSION: Normal electroencephalogram. There are no focal lateralizing or epileptiform features.   Jim Like, DO [facility] [phone]  If 7pm- 7am, please page neurology on call as listed in [place]. 07/30/2015, 11:52 AM

## 2015-07-30 NOTE — Progress Notes (Signed)
EEG Completed; Results Pending  

## 2015-07-30 NOTE — Discharge Summary (Signed)
[facility] Discharge Summary  Patient name: Wanda [name] Medical record number: [id number] Date of birth: July 03, 1951 Age: 64 y.o. Gender: female Date of Admission: 07/29/2015  Date of Discharge: 07/30/2015  Admitting Physician: Blane Ohara McDiarmid, MD  Primary Care Provider: JEFFERY,[name], PA-C Consultants: Neurology   Indication for Hospitalization: Altered Mental Status 2/2 to Medication   Discharge Diagnoses/Problem List:  RRMS  Anemia  HLD  Hx of HTN   Disposition: Home   Discharge Condition: Stable   Discharge Exam:  Physical Exam: General: Patient with NAD, lying in bed.  Cardiovascular: RRR, no murmurs, no rubs, no gallops Respiratory: Normal respiratory effort, chest expands symmetrically. Lungs are clear to auscultation, no crackles or wheezes. Abdomen: BS+, Non ttp, no rebound, Neuro: decreased strength in right leg (3/5), rest of extremities with normal strength in the rest of limbs, normal sensation throughout  Brief Hospital Course:  Patient was admitted with sudden onset of altered mental status in the morning after previously being coherent the night before. On admission, patient was orientated to self, place, however not orientated to time (month or year). When asked if patient knew the year, patient kept stating that it was Monday. Patient has been afebrile and HR 56-62. Electrolytes wnl. WBC 7.1. Lactic Acid 2.24, trending to 0.6 after fluid resuscitations. EKG was wnl. Troponin 0.0. UDS negative except for Benzos. UA was positive only for trace leukocytes. Neurology was consulted. EEG and MRI was performed showing no acute changes from last MRI in June and no seizure like activity. Other forms of encephalopathy were r/o with ammonia and TSH. Finally, CK was checked to further indicate the lack of seizure like activity. Patient currently on several medications that could have effected her mental status, including HCTZ, trazodone, and  Benzos.   Issues for Follow Up:  1. Discontinue patient's hydrochlorothiazide, blood pressures were low.  2. Consider readdressing benzo and trazodone this maybe contributing to altered mental status per neurology   Significant Procedures:  None  Significant Labs and Imaging:   Recent Labs Lab 07/29/15 0938 07/29/15 1015 07/29/15 2011 07/30/15 0700  WBC 7.1  --  7.6 5.2  HGB 13.0 15.0 10.6* 10.1*  HCT 40.2 44.0 32.1* 31.8*  PLT 119*  --  145* 100*    Recent Labs Lab 07/29/15 0938 07/29/15 1015 07/29/15 2011 07/30/15 0700  NA 146* 144 140 139  K 4.3 4.2 4.2 3.8  CL 112* 115* 112* 113*  CO2 22  --  22 21*  GLUCOSE 114* 108* 107* 94  BUN _0 CREATININE 0.86 0.90 0.95 0.85  CALCIUM 10.0  --  8.6* 8.4*  ALKPHOS 80  --   --  59  AST 31  --   --  17  ALT 40  --   --  29  ALBUMIN 3.8  --   --  2.6*      Results/Tests Pending at Time of Discharge: None   Discharge Medications:    Medication List    ASK your doctor about these medications        atorvastatin 20 MG tablet  Commonly known as:  LIPITOR  take 1 tablet by mouth once daily     BACLOFEN PO  Take 70 mg by mouth daily. Patient is on a study drug with NEURO- Baclofen ER 40 MG with 30 MG with a total of 70 MG     clonazePAM 1 MG tablet  Commonly known as:  KLONOPIN  take  1 tablet by mouth twice a day if needed for anxiety     FISH OIL PO  Take 1 tablet by mouth daily.     meloxicam 15 MG tablet  Commonly known as:  MOBIC  take 1 tablet by mouth once daily     modafinil 200 MG tablet  Commonly known as:  PROVIGIL  Take 1 tablet (200 mg total) by mouth daily.     Na Sulfate-K Sulfate-Mg Sulf Soln  Take 1 kit by mouth once. Per colonoscopy instruction     traZODone 50 MG tablet  Commonly known as:  DESYREL  TAKE 1/2 TO 1 TABLET BY MOUTH AT BEDTIME IF NEEDED FOR SLEEP     TYSABRI 300 MG/15ML injection  Generic drug:  natalizumab  Inject 300 mg into the vein every 30 (thirty) days. 2nd  of each month     VITAMIN E PO  Take 1 tablet by mouth daily.        Discharge Instructions: Please refer to Patient Instructions section of EMR for full details.  Patient was counseled important signs and symptoms that should prompt return to medical care, changes in medications, dietary instructions, activity restrictions, and follow up appointments.   Follow-Up Appointments:     Follow-up Information    Follow up with JEFFERY,[name], PA-C. Schedule an appointment as soon as possible for a visit in 1 week.   Specialty:  Family Medicine   Why:  Hospital follow-up   Contact information:   [place] [address] [phone]       Tonette Bihari, MD 07/30/2015, 1:08 PM PGY-1, [place]

## 2015-07-30 NOTE — Clinical Documentation Improvement (Signed)
Family Medicine Neurology  #1 of 2:  Abnormal Lab/Test Results:   Component      Hemoglobin HCT WBC  Latest Ref Rng      12.0 - 15.0 g/dL 36.0 - 46.0 % 4.0 - 10.5 K/uL  07/29/2015     9:38 AM 13.0 40.2 7.1  07/29/2015     10:15 AM 15.0 44.0   07/29/2015     8:11 PM 10.6 (L) 32.1 (L) 7.6  07/30/2015      10.1 (L) 31.8 (L) 5.2   Component      RBC  Latest Ref Rng      3.87 - 5.11 MIL/uL  07/29/2015     9:38 AM 4.61  07/29/2015     8:11 PM 3.74 (L)  07/30/2015      3.68 (L)    Possible Clinical Conditions associated with below indicators  Anemia (if present please specify acuity and type)  Other Condition  Cannot Clinically Determine at present  #2 of 2: Component      Platelets  Latest Ref Rng      150 - 400 K/uL  07/29/2015     9:38 AM 119 (L)  07/29/2015     8:11 PM 145 (L)  07/30/2015      100 (L)   Possible Clinical Conditions: -Thrombocytopenia -Other condition -Cannot clinically determine at present  Please exercise your independent, professional judgment when responding. A specific answer is not anticipated or expected. Please update your documentation within the medical record to reflect your response to this query.  Thank you, Mateo Flow, RN [phone] Clinical Documentation Specialist

## 2015-07-30 NOTE — Discharge Instructions (Signed)
You were admitted for changes in mental status. You were not found to have seizure like activity. Your MRI was not significant. This could also be due to medication changes. Please stop taking your home Hydrochlorothiazide as this might be cause these episodes. Consider discussing your medications with your neurologist as these may have also contributed to this episode

## 2015-07-30 NOTE — Progress Notes (Signed)
Subjective: Patient back to baseline.  No difficulty with speech.  Continues with baseline RLE weakness.    Objective: Current vital signs: BP 101/56 mmHg  Pulse 62  Temp(Src) 98.2 F (36.8 C) (Oral)  Resp 18  Ht 5' (1.524 m)  Wt 55.9 kg (123 lb 3.8 oz)  BMI 24.07 kg/m2  SpO2 100% Vital signs in last 24 hours: Temp:  [98 F (36.7 C)-98.6 F (37 C)] 98.2 F (36.8 C) (10/25 0641) Pulse Rate:  [56-69] 62 (10/25 0641) Resp:  [18-20] 18 (10/25 0641) BP: (88-117)/(44-56) 101/56 mmHg (10/25 0641) SpO2:  [98 %-100 %] 100 % (10/25 0641) Weight:  [55.9 kg (123 lb 3.8 oz)] 55.9 kg (123 lb 3.8 oz) (10/24 1301)  Intake/Output from previous day: 10/24 0701 - 10/25 0700 In: 575 [P.O.:240; I.V.:335] Out: 700 [Urine:700] Intake/Output this shift: Total I/O In: 240 [P.O.:240] Out: -  Nutritional status: Diet Heart Room service appropriate?: Yes; Fluid consistency:: Thin  Neurologic Exam: Mental Status: Alert, oriented, thought content appropriate. Speech fluent with evidence of word finding difficulty. Able to follow 3 step commands without difficulty. Cranial Nerves: II: Discs flat bilaterally; Visual fields grossly normal, pupils equal, round, reactive to light and accommodation III,IV, VI: ptosis not present, extra-ocular motions intact bilaterally V,VII: smile symmetric, facial light touch sensation normal bilaterally VIII: hearing normal bilaterally IX,X: uvula rises symmetrically XI: bilateral shoulder shrug XII: midline tongue extension Motor: Right :Upper extremity 5/5Left: Upper extremity 5/5 Lower extremity 3/5 (old)Lower extremity 5/5 Tone and bulk:normal tone throughout; no atrophy noted Sensory: Pinprick and light touch intact throughout, bilaterally Deep Tendon Reflexes: 2+ and symmetric throughout UE 1+ in KJ bilaterally and no AJ Plantars: Right:  downgoingLeft: downgoing   Lab Results: Basic Metabolic Panel:  Recent Labs Lab 07/29/15 0938 07/29/15 1015 07/29/15 2011 07/30/15 0700  NA 146* 144 140 139  K 4.3 4.2 4.2 3.8  CL 112* 115* 112* 113*  CO2 22  --  22 21*  GLUCOSE 114* 108* 107* 94  BUN 14 18 18 14   CREATININE 0.86 0.90 0.95 0.85  CALCIUM 10.0  --  8.6* 8.4*    Liver Function Tests:  Recent Labs Lab 07/29/15 0938 07/30/15 0700  AST 31 17  ALT 40 29  ALKPHOS 80 59  BILITOT 0.3 0.4  PROT 7.4 5.2*  ALBUMIN 3.8 2.6*   No results for input(s): LIPASE, AMYLASE in the last 168 hours.  Recent Labs Lab 07/29/15 2011  AMMONIA 36*    CBC:  Recent Labs Lab 07/29/15 0938 07/29/15 1015 07/29/15 2011 07/30/15 0700  WBC 7.1  --  7.6 5.2  NEUTROABS 3.5  --   --   --   HGB 13.0 15.0 10.6* 10.1*  HCT 40.2 44.0 32.1* 31.8*  MCV 87.2  --  85.8 86.4  PLT 119*  --  145* 100*    Cardiac Enzymes:  Recent Labs Lab 07/29/15 2011  CKTOTAL 142    Lipid Panel: No results for input(s): CHOL, TRIG, HDL, CHOLHDL, VLDL, LDLCALC in the last 168 hours.  CBG:  Recent Labs Lab 07/29/15 1024  GLUCAP 29    Microbiology: Results for orders placed or performed during the hospital encounter of 07/29/15  Urine culture     Status: None   Collection Time: 07/29/15 11:42 AM  Result Value Ref Range Status   Specimen Description URINE, CATHETERIZED  Final   Special Requests NONE  Final   Culture NO GROWTH 1 DAY  Final   Report Status 07/30/2015 FINAL  Final    Coagulation Studies: No results for input(s): LABPROT, INR in the last 72 hours.  Imaging: Ct Head Wo Contrast  07/29/2015  CLINICAL DATA:  Altered mental status.  Multiple sclerosis. EXAM: CT HEAD WITHOUT CONTRAST TECHNIQUE: Contiguous axial images were obtained from the base of the skull through the vertex without intravenous contrast. COMPARISON:  03/13/2015. FINDINGS: Diffusely enlarged ventricles and subarachnoid  spaces. Patchy white matter low density in both cerebral hemispheres. Unremarkable bones and included paranasal sinuses. IMPRESSION: 1. No acute abnormality. 2. Stable atrophy. 3. Stable chronic white matter disease compatible with a combination of multiple sclerosis and chronic small vessel ischemic changes. Electronically Signed   By: Claudie Revering M.D.   On: 07/29/2015 10:26   Mr Jeri Cos NO Contrast  07/29/2015  CLINICAL DATA:  64 year old female with chronic multiple sclerosis. Acute onset speech difficulty this morning. JC virus positive. Currently on Tysabri. Initial encounter. EXAM: MRI HEAD WITHOUT AND WITH CONTRAST TECHNIQUE: Multiplanar, multiecho pulse sequences of the brain and surrounding structures were obtained without and with intravenous contrast. CONTRAST:  92mL MULTIHANCE GADOBENATE DIMEGLUMINE 529 MG/ML IV SOLN COMPARISON:  Head CT without contrast 0959 hours today. Brain MRI 03/13/2015. FINDINGS: No restricted diffusion or evidence of acute infarction. Major intracranial vascular flow voids are stable and within normal limits. No intracranial mass effect or ventriculomegaly. No acute intracranial hemorrhage identified. Chronic small left lateral ventricle atrium round subependymal collection with fluid level on FLAIR imaging (series 7, image 14) and hemosiderin ring. This demonstrates diffusion restriction (series 4, image 26). This is subtle and hypodense by head CT. No associated mass effect. No similar lesion identified elsewhere. No other chronic cerebral blood products. Confluent cerebral white matter T2 and FLAIR hyperintensity with configuration suggesting chronic demyelinating disease including temporal lobe involvement and chronic corpus callosum volume loss. Involvement of the deep gray matter nuclei and deep white matter capsules appears stable. Patchy T2 hyperintensity in the pons is stable. Sparing of the cerebellum. Sparing of the cervicomedullary junction. No restricted or  enhancing lesion identified. Visible internal auditory structures appear normal. Mastoids and paranasal sinuses are clear. Orbits soft tissues appear stable. Negative scalp soft tissues. IMPRESSION: 1. No acute intracranial abnormality. Chronic demyelinating disease appears stable since June with no areas of acute demyelination identified. 2. Small, unusual hemorrhagic subependymal cyst / lesion adjacent to the left atrium is unchanged since June. Electronically Signed   By: Genevie Ann M.D.   On: 07/29/2015 20:01   Dg Chest Port 1 View  07/29/2015  CLINICAL DATA:  AMS Hypertension I10 MS (multiple sclerosis) (HCC) G35 Altered mental status 1 day EXAM: PORTABLE CHEST - 1 VIEW COMPARISON:  03/12/2015 FINDINGS: Heart size upper limits normal for technique. Ectatic/aneurysmal thoracic aorta. Relatively low lung volumes. No confluent airspace disease. No overt edema. No pneumothorax. No effusion. Visualized skeletal structures are unremarkable. IMPRESSION: 1. Ectatic/aneurysmal thoracic aorta as before. No acute or superimposed abnormality. Electronically Signed   By: Lucrezia Europe M.D.   On: 07/29/2015 09:42    Medications:  I have reviewed the patient's current medications. Scheduled: . atorvastatin  20 mg Oral q1800  . baclofen  30 mg Oral Daily  . enoxaparin (LOVENOX) injection  40 mg Subcutaneous Q24H  . meloxicam  15 mg Oral Daily  . modafinil  200 mg Oral Daily  . traZODone  50 mg Oral QHS    Assessment/Plan: Unclear reasons for patient's symptoms on yesterday.  MRI of the brain personally reviewed and shows no acute changes  or evidence of active demyelination.  EEG is normal although presentation does leave seizure on the differential.  Will not treat for seizure at this time without a more definitive presentation.    Recommendations: 1.  No further neurologic intervention is recommended at this time.  If further questions arise, please call or page at that time.  Thank you for allowing neurology  to participate in the care of this patient.  Continue home medications.      LOS: 1 day   Alexis Goodell, MD [facility] [phone] 07/30/2015  12:20 PM

## 2015-07-30 NOTE — Progress Notes (Addendum)
Paged internal Medicine for a PT consult.  Internal medicine stated that she will be d/c today as long as Neuro said she is good for d/c.  No PT consult needed

## 2015-07-31 ENCOUNTER — Telehealth: Payer: Self-pay | Admitting: Neurology

## 2015-07-31 ENCOUNTER — Other Ambulatory Visit: Payer: Self-pay | Admitting: Physician Assistant

## 2015-07-31 NOTE — Telephone Encounter (Signed)
Dr. Krista Blue reviewed her chart and would like the patient to come in for a follow up appointment.  She has been scheduled for 08/05/15.

## 2015-07-31 NOTE — Telephone Encounter (Signed)
Patient's daughter is calling regarding the patient. The patient is now hospitalized and would like to discuss the infusion that was scheduled for next week. She did not want to speak with [name]. Thank you.

## 2015-08-03 LAB — CULTURE, BLOOD (ROUTINE X 2)
CULTURE: NO GROWTH
Culture: NO GROWTH

## 2015-08-05 ENCOUNTER — Ambulatory Visit (INDEPENDENT_AMBULATORY_CARE_PROVIDER_SITE_OTHER): Payer: Federal, State, Local not specified - PPO | Admitting: Neurology

## 2015-08-05 ENCOUNTER — Encounter: Payer: Self-pay | Admitting: Neurology

## 2015-08-05 VITALS — BP 140/79 | HR 70 | Ht 60.0 in | Wt 122.0 lb

## 2015-08-05 DIAGNOSIS — R269 Unspecified abnormalities of gait and mobility: Secondary | ICD-10-CM

## 2015-08-05 DIAGNOSIS — G35D Multiple sclerosis, unspecified: Secondary | ICD-10-CM

## 2015-08-05 DIAGNOSIS — F05 Delirium due to known physiological condition: Secondary | ICD-10-CM | POA: Diagnosis not present

## 2015-08-05 DIAGNOSIS — G35 Multiple sclerosis: Secondary | ICD-10-CM | POA: Diagnosis not present

## 2015-08-05 DIAGNOSIS — G9340 Encephalopathy, unspecified: Secondary | ICD-10-CM | POA: Diagnosis not present

## 2015-08-05 DIAGNOSIS — R404 Transient alteration of awareness: Secondary | ICD-10-CM | POA: Diagnosis not present

## 2015-08-05 MED ORDER — LAMOTRIGINE ER 100 MG PO TB24: ORAL_TABLET | ORAL | Status: DC

## 2015-08-05 MED ORDER — LAMOTRIGINE ER 25 MG PO TB24: ORAL_TABLET | ORAL | Status: DC

## 2015-08-05 NOTE — Progress Notes (Signed)
Chief Complaint  Patient presents with  . Multiple Sclerosis    She is here with her daughter, Wanda [name].  Says she found her mother, at home on 07/29/15, confused and weak with labored breathing.  She was taken to the hospital but said the only abnormal test showed she was anemic.  She was given fluids, observed and discharged.  She has not had any further incidents. She has not noticed blood with urination or bowel movements. She has a colonoscopy scheduled on 08/27/15.       PATIENT: Wanda [name] DOB: 1950/12/21  HISTORICAL   INITIAL VISIT In FEB 2015  Wanda [name] is accompanied by her daughter, Wanda [name], she was initially evaluated by Dr Rexene Alberts in December 2nd 2014, referred by her primary care Dr.Jeffrey  [name] is a 64 years old right-handed African American female, with past medical history of hypertension, hyperlipidemia, longtime smoker  She presents with acute onset of slurred speech, vertigo, since November 7th 2014, also has gait difficulty,  CAT scan of the brain without contrast in November 2014 showed moderate for age nonspecific cerebral white matter changes  MRI showed multiple scattered , discrete and confluent lesions at periventricular, periatrial, peri-callosal brainstem, cranial medullary junction and upper cervical spine white matter hyperintensities on T2/flair.  Enhancing lesions in the left paramedian pons. Left posterior frontal and left parietal and tiny right frontal periventricular white matter which likely represent active demarcating plaques. multiple T1 black holes noted in the periventricular and subcortical white matter. There is mild general cerebral atrophy.  MRI scan of the cervical spine showed spinal cord hyperintensities at C2 and C5 likely represent demyelinating plaques. Contrast images show actively enhancing lesion at C2.  Laboratory evaluation showed normal or negative NMO antibody, A1c 5. 9, ANA, CPK, K09, folic acid, vitamin B1, ESR, TSH,  RPR, CMP, , CBC,  CSF study show oligoclonal banding four,, normal meyelin basic protein. ACE was normal 8,   WBC 0, RBC 6, glucose 59, total protein 43,   JC virus titer was positive, 0.66, repeat titer 0.60 in 08/10/2014. 0.58 Sep 2016  She moved to [place] around 2010, In 2001, she had one episode of sudden right visual loss, reported extensive evaluation at that time, was diagnosed with right "eye problem"have received po steroid, intraocular steroid injection, per patient MRI of the brain was done at that time too.  Her right vision never came back to normal, it was blurry, she can only read large print with the right eye  Before this episode, she was highly functional, she retired as a Software engineer, lives independently, no gait difficulty, exercise regularly, driving,   After discuss with patient, and her daughter, she has active form of RRMS, we decided to proceed with [facility] IV infusion started since January 29 2014,  she understands the potential risks, including PML infection, but with her low JC virus  titer, she has a less than 0.10/998 chance of getting PML in the first 24 months, we are monitoring her JC virus antibody titer every 3 months, repeating MRI of brain every to 6 months.  She also agrees to enroll in baclofen extended release trial, because she continues to have bilateral upper and lower extremity muscle spasticity.  UPDATE Oct 2nd 2015:  She was enrolled in [employer] baclofen extended release study since May 24 2014,, tolerating the medication well, on last visit in September 24th 2015, her baclofen dosage was increased from 20 mg, to 30 milligrams daily, she  tolerated the medication very well, reported continued improvement, less  bilaterally hands muscle spasm, less bilateral lower extremity muscle spasm, she is able to squeeze them and again, she fell a few days ago, has right lateral thigh area proves, achy pain,  UPDATE Nov 6th 2015: A week before  her most recent  Tysarbri infusion in October seventh 2015, she felt right arm, and leg weakness, increased gait difficulty, fatigue, getting worse since October seventh 2015, which has been persistent, she had quit driving about 2 weeks ago, no confident in her right leg controlling the gas paddles.  We have reviewed repeat MRI of the brain together,there was evidence of Moderate-severe periventricular and subcortical and pontine and upper cervical cord chronic demyelinating disease. Mild diffuse and severe corpus callosum atrophy.  No abnormal lesions are seen on post contrast views. Compared to MRI on 09/21/13 and 10/11/13, there is a new chronic plaque (56m) in the left basal ganglia (series 17, image 13). Also prior acute plaques no longer enhance.  This new lesions involving left basal ganglion, likely explaining for her acute worsening of right side weakness, could indicating a MS flareup,  UPDATE Nov 18th 2015:  She is here for some formal follow-up according to protocol, currently taking baclofen ER 40 mg every day without significant side effect, she was treated with Metro pack since last visit in August 10 2014, reported improvement of her right side weakness, but still not back to her baseline, could not drive, because of lack of control of right ankle movement  She also complains of excessive fatigue,  Anti-Tysarbri Antibody was negative in Nov 2015  UPDATE Feb 24th 2016: She started taking provigil 100 mg twice a day, which has been helpful, she is tolerating baclofen ER 40 mg every day, which does help her right upper and lower extremity spasticity, she continue have gait difficulty, no longer driving, low titer JC virus positive antibody, 0.6, negative Tysarbri antibody, she did has 1 flareups of right-sided weakness in October 2015, 6 months after taking Tysarbri April 2015,  She continue complains of right upper lower extremity muscle spasticity, will increase her baclofen ER  to 50 mg daily  UPDATE March 8th 2016: Higher dose of baclofen ER 50 mg every night, works better, she has less right arm and right leg spasticity, clonazepam 1 mg as needed works well for her insomnia, she hopes to be on higher dose of baclofen, for better control of her right side spasticity.  UPDATE March 20 2015: She was admitted to hospital in March 22 2015, she has developed constipation few days prior, take over-the-counter laxatives, had watery diarrhea in March 11 2015, early morning March 12 2015, while getting up using bathroom, she complains of lightheadedness, slumped over, transient loss of consciousness, mild confusion afterwards her daughter called ambulance, was noted to be hypotensive upon presentation, Blood pressure 88/54, pulse 58, temperature 94.5 F (34.7 C), resp. rate 13, weight 56.7 kg (125 lb), SpO2 100 %. With mild elevated BUN/creatinine, I have reviewed the repeat MRI of the brain in March 13 2015, stable MS lesions,no acute enhancing lesions.  I reviewed laboratory, normal CBC, BMP showed low potassium 3.4, mild elevated creatinine 1.08  UPDATE Aug 05 2015: She was found by her daughter in the bathroom, disoriented, sweaty, slumped over in July 30 2015, she complains weakness all over, disoriented, but no loss of consciousness, there was patchy memory of the event, she remembers she was awakened by the dog, wanted to use the  bathroom, Could not remember rest of the event afterwards,  The confusion lasted for a few more hours  She was taken to the emergency room,Had extensive evaluations, I have personally reviewed MRI of the brain with and without contrast October 2016:No acute intracranial abnormality. Chronic demyelinating disease appears stable since June with no areas of acute demyelination identified. Small, unusual hemorrhagic subependymal cyst / lesion adjacent to the left atrium is unchanged since June.  EEG was normal,laboratory evaluation showed normal TSH, CBC  showed mild anemia 10 point 8, normal CMP, UA showed trace leukocytosis  This episode is very similar to her previous hospital admission in June 2016, Sudden onset acute confusion generalized weakness,  Review of system: system review of systems performed and notable only for  gait difficulty, right side weakness  ALLERGIES: No Known Allergies  HOME MEDICATIONS: Outpatient Prescriptions Prior to Visit  Medication Sig Dispense Refill  . atorvastatin (LIPITOR) 20 MG tablet Take 1 tablet (20 mg total) by mouth daily.  90 tablet  3  . estradiol (ESTRACE) 0.5 MG tablet Take 1 tablet (0.5 mg total) by mouth daily.  90 tablet  3  . glucosamine-chondroitin 500-400 MG tablet Take 1 tablet by mouth 2 (two) times daily with a meal.      . hydrochlorothiazide (HYDRODIURIL) 25 MG tablet take 1 tablet by mouth once daily  90 tablet  1  . medroxyPROGESTERone (PROVERA) 2.5 MG tablet Take 1 tablet (2.5 mg total) by mouth daily.  90 tablet  4  . meloxicam (MOBIC) 15 MG tablet take 1 tablet by mouth once daily  90 tablet  1  . traZODone (DESYREL) 50 MG tablet TAKE 1/2 TO 1 TABLET AT BEDTIME AS NEEDED FOR SLEEP.  30 tablet  2  . varenicline (CHANTIX) 1 MG tablet Take 1 tablet (1 mg total) by mouth 2 (two) times daily.  60 tablet  5  . zolpidem (AMBIEN) 10 MG tablet take 1 tablet by mouth at bedtime if needed  30 tablet  0     PAST MEDICAL HISTORY: Past Medical History  Diagnosis Date  . Elevated cholesterol   . Hypertension   . Arthritis   . Cervical dysplasia   . Depression   . Anxiety   . Macular degeneration of right eye 2001    PAST SURGICAL HISTORY: Past Surgical History  Procedure Laterality Date  . Colposcopy    . Knee surgery    . Cholecystectomy    . Tubal ligation      FAMILY HISTORY: Family History  Problem Relation Age of Onset  . Cancer Mother     Colon  . Diabetes Mother   . Hypertension Mother   . Arthritis Mother   . Cancer Father     prostate  . Kidney disease  Brother     congenital single kidney  . Arthritis Sister   . Arthritis Sister   . Hematuria Son   . Gout Brother   . Multiple sclerosis Brother   . Arthritis Brother   . HIV Brother   . Cancer Brother     spinal    SOCIAL HISTORY:  History   Social History  . Marital Status: Widowed    Spouse Name: N/A    Number of Children: 2  . Years of Education: college   Occupational History  . retired     Social History Main Topics  . Smoking status: Current Some Day Smoker -- 1.00 packs/day for 45 years    Types: Cigarettes  Last Attempt to Quit: 01/21/2013  . Smokeless tobacco: Never Used  . Alcohol Use: 3.6 oz/week    6 Cans of beer per week  . Drug Use: No  . Sexual Activity: No   Other Topics Concern  . Not on file   Social History Narrative   Grew up in [place] area, 1 of 10 siblings - 73 still living, married for 33 years then divorced, moved to [place] to be near daughter post retirement, als has 1 son. 2 dogs.       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     PHYSICAL EXAM   PHYSICAL EXAMNIATION:  Gen: NAD, conversant, well nourised, obese, well groomed                     Cardiovascular: Regular rate rhythm, no peripheral edema, warm, nontender. Eyes: Conjunctivae clear without exudates or hemorrhage Neck: Supple, no carotid bruise. Pulmonary: Clear to auscultation bilaterally   NEUROLOGICAL EXAM:  MENTAL STATUS: Speech:    Speech is normal; fluent and spontaneous with normal comprehension.  Cognition:    The patient is oriented to person, place, and time;     recent and remote memory intact;     language fluent;     normal attention, concentration,     fund of knowledge.  CRANIAL NERVES: CN II: Visual fields are full to confrontation. Fundoscopic exam is normal with sharp discs and no vascular changes. Pupils are 4 mm and briskly reactive to light.  CN III, IV, VI: extraocular movement are normal. No ptosis. CN V: Facial  sensation is intact to pinprick in all 3 divisions bilaterally. Corneal responses are intact.  CN VII: Face is symmetric with normal eye closure and smile. CN VIII: Hearing is normal to rubbing fingers CN IX, X: Palate elevates symmetrically. Phonation is normal. CN XI: Head turning and shoulder shrug are intact CN XII: Tongue is midline with normal movements and no atrophy.  MOTOR: There is no pronator drift of out-stretched arms. Muscle bulk and tone are normal. Muscle strength is normal.   Shoulder abduction Shoulder external rotation Elbow flexion Elbow extension Wrist flexion Wrist extension Finger abduction Hip flexion Knee flexion Knee extension Ankle dorsi flexion Ankle plantar flexion  R 4 4 4- 4 4 4 4 3 4 4  4- 4+  L 5 5 5 5 5 5 5 5 5 5 5 5     REFLEXES: Reflexes are 2+ and symmetric at the biceps, triceps, knees, and ankles. Plantar responses are flexor.  SENSORY: Light touch, pinprick, position sense, and vibration sense are intact in fingers and toes.  COORDINATION: Rapid alternating movements and fine finger movements are intact. There is no dysmetria on finger-to-nose and heel-knee-shin. There are no abnormal or extraneous movements.   GAIT/STANCE: Unsteady, right hemi-circumferential gait, dragging her right leg, Romberg is absent.  DIAGNOSTIC DATA (LABS, IMAGING, TESTING) - I reviewed patient records, labs, notes, testing and imaging myself where available.   ASSESSMENT AND PLAN   64 years old African American female  Relapsing remitting multiple sclerosis,   presented with previous optic neuritis, gait difficulty since 2014, worsening right-sided weakness since September 2015, while taking Tysarbri IV infusion,  Repeat  MRI of the brain showed new lesion at left basal ganglia/internal capsule region, in the setting of extensive lesions involving brain, and cervical.   She has been treated with Tysarbri infusion since January 29 2014, tolerating medication well,  She is  JC virus positive, with titer of  0.6 in November 2015,  FATIGUE  Keep provigil 100 mg twice a day Insomnia  Clonazepam 1 mg as needed   New onset recurrent acute Confusion,possible complex partial seizure  Starting lamotrigine xr, titrating to 100 mg, 2 tablets every night    Marcial Pacas, M.D. Ph.D.  [facility] [address] [place], [place] [address] [phone]

## 2015-08-06 ENCOUNTER — Encounter: Payer: Self-pay | Admitting: Physician Assistant

## 2015-08-06 ENCOUNTER — Ambulatory Visit (INDEPENDENT_AMBULATORY_CARE_PROVIDER_SITE_OTHER): Payer: Federal, State, Local not specified - PPO | Admitting: Physician Assistant

## 2015-08-06 VITALS — BP 128/96 | HR 76 | Temp 98.7°F | Resp 16 | Ht 60.0 in | Wt 127.0 lb

## 2015-08-06 DIAGNOSIS — D649 Anemia, unspecified: Secondary | ICD-10-CM | POA: Diagnosis not present

## 2015-08-06 DIAGNOSIS — R402413 Glasgow coma scale score 13-15, at hospital admission: Secondary | ICD-10-CM

## 2015-08-06 DIAGNOSIS — Z1231 Encounter for screening mammogram for malignant neoplasm of breast: Secondary | ICD-10-CM | POA: Diagnosis not present

## 2015-08-06 NOTE — Progress Notes (Signed)
Patient ID: Wanda [name], female    DOB: 1951/02/27, 64 y.o.   MRN: [id number]  PCP: [facility]ne [facility]  Subjective:   Chief Complaint  Patient presents with  . Hospitalization Follow-up    HPI Presents for hospital follow-up. This patient has MS and HTN, insomnia and anxiety.  She was admitted through the ED last week due to altered mental status, thought due to medication. It is recommended that we review her prescriptions and reassess the risks and benefits of the clonazepam and trazodone she takes for anxiety and insomnia.  She has used both drugs for at least several years. In combination, and in combination with Baclofen, CNS effects can be accentuated.  At her neurology follow-up yesterday, Trazodone was discontinued and Lamictal was started.  Tysabri infusion scheduled for tomorrow. Dr. Rhea Belton note is reviewed.  During hospitalization HCTZ was stopped due to hypotension. EEG and brain MRI showed no changes from those performed in June.  No infection was found, and no other cause for altered mental status was identified. Hospital labs, imaging and discharge summary reviewed.  Feeling weak and sleepy today, but the weakness is normal for her right before she gets her infusions.   Left anterior knee bothering her, clicks quite a bit, getting worse since the hospitalization. Normally ambulates with a cane at home, but has felt increasingly off-balance so is using her walker more. Wondering if a steroid injection would help.   Right-sided weakness has been stable.   Complaining of right upper calf pain and cramping, worse in the AM when she tries to get out of bed.   Speech has come back since the hospitalization. No new visual changes.   Patient has a history of normocytic anemia and is being followed by GI for this. Has a colonoscopy scheduled for 08/27/15.  Appetite has been good since being hospitalized. Eats very healthy, but does not eat a lot of red meat. Has started  eating liver again in the last month. Ate a steak for the first time in 6 months 2 nights ago with her daughter. Wondering if she needs to start taking iron pills.     Review of Systems Constitutional: Positive for activity change (decreased d/t increased left knee pain and ongoing right-sided weakness). Negative for appetite change.  HENT: Negative for voice change (speech normal, no slurring or difficulty speaking).  Eyes: Negative for visual disturbance.  Endocrine: Positive for cold intolerance (chronic cold intolerance).  Musculoskeletal: Positive for myalgias (right upper calf pain and cramping), arthralgias (left anterior knee pain and clicking) and gait problem (uses walker or cane).  Neurological: Positive for weakness. Negative for dizziness (improved since hospitalization), speech difficulty, numbness and headaches.  Psychiatric/Behavioral: Negative for sleep disturbance.      Patient Active Problem List   Diagnosis Date Noted  . Normocytic anemia 07/30/2015  . Altered mental status 07/29/2015  . MS (multiple sclerosis) ([place])   . Dehydration   . Hypokalemia   . Altered level of consciousness 03/12/2015  . Acute encephalopathy 03/12/2015  . Gait difficulty 08/10/2014  . Multiple sclerosis ([place]) 10/25/2013  . Insomnia 03/23/2013  . Fibroids 12/29/2012  . Elevated cholesterol   . Hypertension   . Arthritis   . Cervical dysplasia      Prior to Admission medications   Medication Sig Start Date End Date Taking? Authorizing Provider  atorvastatin (LIPITOR) 20 MG tablet take 1 tablet by mouth once daily 05/28/14  Yes Ileene Allie, PA-C  BACLOFEN PO Take 70 mg  by mouth daily. Patient is on a study drug with [facility]UR[facility]- Baclofen ER 40 MG with 30 MG with a total of 70 MG   Yes Historical Provider, MD  clonazePAM (KLONOPIN) 1 MG tablet take 1 tablet by mouth twice a day if needed for anxiety 05/27/15  Yes Marcial Pacas, MD  LamoTRIgine (LAMICTAL XR) 100 MG TB24 2 tabs po qhs 08/05/15   Yes Marcial Pacas, MD  LamoTRIgine (LAMICTAL XR) 25 MG TB24 tablet One tab po qhs xone week, then 2 tabs po qhs xone week, then 3 tabs po qhs. 08/05/15  Yes Marcial Pacas, MD  meloxicam (MOBIC) 15 MG tablet take 1 tablet by mouth once daily 06/12/15  Yes Marcial Pacas, MD  modafinil (PROVIGIL) 200 MG tablet Take 1 tablet (200 mg total) by mouth daily. 02/26/15  Yes Marcial Pacas, MD  natalizumab (TYSABRI) 300 MG/15ML injection Inject 300 mg into the vein every 30 (thirty) days. 2nd of each month   Yes Historical Provider, MD  Omega-3 Fatty Acids (FISH OIL PO) Take 1 tablet by mouth daily.   Yes Historical Provider, MD  VITAMIN E PO Take 1 tablet by mouth daily.   Yes Historical Provider, MD  traZODone (DESYREL) 50 MG tablet Take 0.5-1 tablets (25-50 mg total) by mouth at bedtime as needed for sleep. PATIENT NEEDS MED REFILL CHECK UP FOR ADDITIONAL REFILLS Patient not taking: Reported on 08/06/2015 08/01/15   Harrison Mons, PA-C     No Known Allergies     Objective:  Physical Exam  Constitutional: She is oriented to person, place, and time. Vital signs are normal. She appears well-developed and well-nourished. She is active and cooperative. No distress.  BP 128/96 mmHg  Pulse 76  Temp(Src) 98.7 F (37.1 C)  Resp 16  Ht 5' (1.524 m)  Wt 127 lb (57.607 kg)  BMI 24.80 kg/m2 Ambulates with a walker. Wearing a hat, per usual.  HENT:  Head: Normocephalic and atraumatic.  Right Ear: Hearing normal.  Left Ear: Hearing normal.  Eyes: Conjunctivae are normal. No scleral icterus.  Neck: Normal range of motion. Neck supple. No thyromegaly present.  Cardiovascular: Normal rate, regular rhythm and normal heart sounds.   Pulses:      Radial pulses are 2+ on the right side, and 2+ on the left side.  Pulmonary/Chest: Effort normal and breath sounds normal.  Musculoskeletal:       Right lower leg: Normal.       Left lower leg: Normal.  No erythema, edema or increased warmth of the RIGHT calf.  Lymphadenopathy:        Head (right side): No tonsillar, no preauricular, no posterior auricular and no occipital adenopathy present.       Head (left side): No tonsillar, no preauricular, no posterior auricular and no occipital adenopathy present.    She has no cervical adenopathy.       Right: No supraclavicular adenopathy present.       Left: No supraclavicular adenopathy present.  Neurological: She is alert and oriented to person, place, and time. No sensory deficit.  Skin: Skin is warm, dry and intact. No rash noted. No cyanosis or erythema. Nails show no clubbing.  Psychiatric: She has a normal mood and affect.           Assessment & Plan:   1. Glasgow coma scale total score 13-15, at hospital admission [facility]) Mental status has returned to baseline. Thought possibly due to medications. Trazodone d/c'd yesterday by neurology. Continue clonazepam PRN insomnia.  2. Normocytic anemia Stable. Proceed with colonoscopy as planned. - CBC with Differential/Platelet  3. Visit for screening mammogram - MM Digital Screening; Future   Fara Chute, PA-C Physician Assistant-Certified [facility]

## 2015-08-06 NOTE — Patient Instructions (Signed)
We recommend that you schedule a mammogram for breast cancer screening. Typically, you do not need a referral to do this. Please contact a local imaging center to schedule your mammogram.  [facility] - [phone]  *ask for the Radiology Department [facility]) - [phone] or [phone]  [facility] - [phone] [facility] - [phone] [facility] - [phone]  *ask for the Radiology Department [facility] - [phone]  *ask for the Radiology Department [facility] - [phone]  *ask for the Mammography Department [facility] - [phone] 

## 2015-08-06 NOTE — Progress Notes (Signed)
Subjective:    Patient ID: Wanda [name], female    DOB: 1951-08-12, 64 y.o.   MRN: [id number]  Chief Complaint  Patient presents with  . Hospitalization Follow-up   HPI Patient presents today for follow-up of hospitalization. Patient was admitted on 07/29/15 for AMS after daughter found her confused and weak in the bathroom at home. Patient was given fluids, observed, and discharged the next day with recommended follow-up on medications.   Followed up with Dr. Krista Blue yesterday, who initiated Lamictal. Discontinued trazadone, but continued clonazepam. Going in tomorrow for Tysabri infusion.   Feeling weak and sleepy today, but the weakness is normal for her right before she gets her infusions.   Left anterior knee bothering her, clicks quite a bit, getting worse since the hospitalization. Normally ambulates with a cane at home, but has felt increasingly off-balance so is using her walker more. Wondering if a steroid injection would help.  Right-sided weakness has been stable. Complaining of right upper calf pain and cramping, worse in the AM when she tries to get out of bed.   Speech has come back since the hospitalization. No new visual changes.    Appetite has been good since being hospitalized. Eats very healthy, but does not eat a lot of red meat.  Has started eating liver again in the last month. Ate a steak for the first time in 6 months 2 nights ago with her daughter. Wondering if she needs to start taking iron pills.   Stopped taking HCTZ over a month ago d/t low blood pressures. States pharmacy won't refill. Does not check her BP at home.   Patient has a history of normocytic anemia and is being followed by GI for this. Has a colonoscopy scheduled for 08/27/15.   No other concerns on today's visit.   Review of Systems  Constitutional: Positive for activity change (decreased d/t increased left knee pain and ongoing right-sided weakness). Negative for appetite change.  HENT:  Negative for voice change (speech normal, no slurring or difficulty speaking).   Eyes: Negative for visual disturbance.  Endocrine: Positive for cold intolerance (chronic cold intolerance).  Musculoskeletal: Positive for myalgias (right upper calf pain and cramping), arthralgias (left anterior knee pain and clicking) and gait problem (uses walker or cane).  Neurological: Positive for weakness. Negative for dizziness (improved since hospitalization), speech difficulty, numbness and headaches.  Psychiatric/Behavioral: Negative for sleep disturbance.    Patient Active Problem List   Diagnosis Date Noted  . Normocytic anemia 07/30/2015  . Altered mental status 07/29/2015  . MS (multiple sclerosis) ([place])   . Dehydration   . Hypokalemia   . Altered level of consciousness 03/12/2015  . Acute encephalopathy 03/12/2015  . Gait difficulty 08/10/2014  . Multiple sclerosis ([place]) 10/25/2013  . Insomnia 03/23/2013  . Fibroids 12/29/2012  . Elevated cholesterol   . Hypertension   . Arthritis   . Cervical dysplasia    Family History  Problem Relation Age of Onset  . Cancer Mother     Colon  . Diabetes Mother   . Hypertension Mother   . Arthritis Mother   . Cancer Father     prostate  . Kidney disease Brother     congenital single kidney  . Arthritis Sister   . Arthritis Sister   . Hematuria Son   . Gout Brother   . Multiple sclerosis Brother   . Arthritis Brother   . HIV Brother   . Cancer Brother     spinal  Social History   Social History  . Marital Status: Widowed    Spouse Name: N/A  . Number of Children: 2  . Years of Education: college   Occupational History  . retired     Social History Main Topics  . Smoking status: Former Smoker -- 1.00 packs/day for 45 years    Types: Cigarettes    Quit date: 10/23/2013  . Smokeless tobacco: Never Used     Comment: Quit smoking in 2014.  Marland Kitchen Alcohol Use: 0.0 oz/week    0 Standard drinks or equivalent per week     Comment:  hasn't drank anything since jan 2015  . Drug Use: No  . Sexual Activity: No   Other Topics Concern  . Not on file   Social History Narrative   Grew up in [place] area, 1 of 10 siblings - 73 still living, married for 33 years then divorced, moved to [place] to be near daughter post retirement, als has 1 son. 2 dogs.       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    Prior to Admission medications   Medication Sig Start Date End Date Taking? Authorizing Provider  atorvastatin (LIPITOR) 20 MG tablet take 1 tablet by mouth once daily 05/28/14  Yes [name] Jeffery, PA-C  BACLOFEN PO Take 70 mg by mouth daily. Patient is on a study drug with NEURO- Baclofen ER 40 MG with 30 MG with a total of 70 MG   Yes Historical Provider, MD  clonazePAM (KLONOPIN) 1 MG tablet take 1 tablet by mouth twice a day if needed for anxiety 05/27/15  Yes Marcial Pacas, MD  LamoTRIgine (LAMICTAL XR) 100 MG TB24 2 tabs po qhs 08/05/15  Yes Marcial Pacas, MD  LamoTRIgine (LAMICTAL XR) 25 MG TB24 tablet One tab po qhs xone week, then 2 tabs po qhs xone week, then 3 tabs po qhs. 08/05/15  Yes Marcial Pacas, MD  meloxicam (MOBIC) 15 MG tablet take 1 tablet by mouth once daily 06/12/15  Yes Marcial Pacas, MD  modafinil (PROVIGIL) 200 MG tablet Take 1 tablet (200 mg total) by mouth daily. 02/26/15  Yes Marcial Pacas, MD  natalizumab (TYSABRI) 300 MG/15ML injection Inject 300 mg into the vein every 30 (thirty) days. 2nd of each month   Yes Historical Provider, MD  Omega-3 Fatty Acids (FISH OIL PO) Take 1 tablet by mouth daily.   Yes Historical Provider, MD  VITAMIN E PO Take 1 tablet by mouth daily.   Yes Historical Provider, MD  traZODone (DESYREL) 50 MG tablet Take 0.5-1 tablets (25-50 mg total) by mouth at bedtime as needed for sleep. PATIENT NEEDS MED REFILL CHECK UP FOR ADDITIONAL REFILLS Patient not taking: Reported on 08/06/2015 08/01/15   Harrison Mons, PA-C   No Known Allergies    Objective:   Physical Exam    Constitutional: She is oriented to person, place, and time. She appears well-developed and well-nourished. No distress.  BP 128/96 mmHg  Pulse 76  Temp(Src) 98.7 F (37.1 C)  Resp 16  Ht 5' (1.524 m)  Wt 127 lb (57.607 kg)  BMI 24.80 kg/m2  HENT:  Head: Normocephalic and atraumatic.  Wearing a hat, patchy hair distribution  Eyes: EOM are normal. No scleral icterus.  Neck: Neck supple.  Cardiovascular: Normal rate, regular rhythm, normal heart sounds and intact distal pulses.  Exam reveals no gallop and no friction rub.   No murmur heard. Pulmonary/Chest: Effort normal and breath sounds  normal. No respiratory distress. She has no wheezes. She has no rales.  Musculoskeletal: She exhibits no edema or tenderness.  Right upper calf non-erythematous or non-edematous. Non-tender to touch. No warmth.  Mild crepitus appreciated in left anterior calf. No erythema, edema, or warmth. Non-tender to touch. ROM WNL.   Lymphadenopathy:    She has no cervical adenopathy.  Neurological: She is alert and oriented to person, place, and time.  Skin: Skin is warm and dry. She is not diaphoretic. No erythema.  Psychiatric: She has a normal mood and affect. Her behavior is normal. Judgment and thought content normal.  Very talkative, but speech is coherent.       Assessment & Plan:  1. Glasgow coma scale total score 13-15, at hospital admission [facility])  - Uncertain etiology, but suspect cumulative effect of co-contaminant use of trazadone, clonazepam, and baclofen. Trazadone discontinued yesterday at neurology follow-up, clonazepam continued on a prn basis. Will continue to monitor.    2. Normocytic anemia - GI following.  - CBC with Differential/Platelet  3. Visit for screening mammogram - MM Digital Screening; Future

## 2015-08-07 LAB — CBC WITH DIFFERENTIAL/PLATELET
Basophils Absolute: 0.1 10*3/uL (ref 0.0–0.1)
Basophils Relative: 1 % (ref 0–1)
EOS PCT: 4 % (ref 0–5)
Eosinophils Absolute: 0.3 10*3/uL (ref 0.0–0.7)
HEMATOCRIT: 35.3 % — AB (ref 36.0–46.0)
HEMOGLOBIN: 11.5 g/dL — AB (ref 12.0–15.0)
LYMPHS PCT: 42 % (ref 12–46)
Lymphs Abs: 2.6 10*3/uL (ref 0.7–4.0)
MCH: 28.5 pg (ref 26.0–34.0)
MCHC: 32.6 g/dL (ref 30.0–36.0)
MCV: 87.4 fL (ref 78.0–100.0)
MONO ABS: 0.4 10*3/uL (ref 0.1–1.0)
MONOS PCT: 7 % (ref 3–12)
NEUTROS ABS: 2.9 10*3/uL (ref 1.7–7.7)
Neutrophils Relative %: 46 % (ref 43–77)
Platelets: 145 10*3/uL — ABNORMAL LOW (ref 150–400)
RBC: 4.04 MIL/uL (ref 3.87–5.11)
RDW: 15.6 % — AB (ref 11.5–15.5)
WBC: 6.3 10*3/uL (ref 4.0–10.5)

## 2015-08-08 DIAGNOSIS — F05 Delirium due to known physiological condition: Secondary | ICD-10-CM | POA: Insufficient documentation

## 2015-08-19 ENCOUNTER — Encounter: Payer: Self-pay | Admitting: Physical Therapy

## 2015-08-19 NOTE — Therapy (Signed)
[facility] [address] [place], [place], [address] Phone: [phone]   Fax:  [phone]  Patient Details  Name: Wanda [name] MRN: [id number] Date of Birth: [birth year] Referring Provider:  No ref. provider found  Encounter Date: 08/19/2015  PHYSICAL THERAPY DISCHARGE SUMMARY  Visits from Start of Care: 4  Current functional level related to goals / functional outcomes: Unknown as patient called to cancel all remaining PT appointments due to MS relapse.   Remaining deficits: Unknown   Education / Equipment: Initiated HEP  Plan: Patient agrees to discharge.  Patient goals were not met. Patient is being discharged due to a change in medical status.  ?????        Prim Morace PT, DPT 08/19/2015, 8:11 AM  [facility] [address] [place], [place], [address] Phone: [phone]   Fax:  [phone]

## 2015-08-22 NOTE — Telephone Encounter (Signed)
Error

## 2015-08-26 ENCOUNTER — Encounter (INDEPENDENT_AMBULATORY_CARE_PROVIDER_SITE_OTHER): Payer: Self-pay

## 2015-08-26 ENCOUNTER — Telehealth: Payer: Self-pay

## 2015-08-26 DIAGNOSIS — Z0289 Encounter for other administrative examinations: Secondary | ICD-10-CM

## 2015-08-26 NOTE — Telephone Encounter (Signed)
I spoke to the patient in regards to the two bottles of Baclofen IR that the patient left in her house. She confirmed that they are in her nightstand and that she would no longer take this but the ones I provided her with during Follow Up Visit #3 for the [employer] study.

## 2015-08-26 NOTE — Telephone Encounter (Signed)
I spoke to the patient in regards to the [employer] study. The patient confirmed the medication that I provided her today.

## 2015-08-27 ENCOUNTER — Ambulatory Visit (AMBULATORY_SURGERY_CENTER): Payer: Self-pay | Admitting: Gastroenterology

## 2015-08-27 ENCOUNTER — Encounter: Payer: Self-pay | Admitting: Gastroenterology

## 2015-08-27 VITALS — BP 142/100 | HR 72 | Temp 96.9°F | Ht 60.0 in | Wt 123.0 lb

## 2015-08-27 DIAGNOSIS — D649 Anemia, unspecified: Secondary | ICD-10-CM

## 2015-08-27 MED ORDER — NA SULFATE-K SULFATE-MG SULF 17.5-3.13-1.6 GM/177ML PO SOLN: 1.0000 | Freq: Once | ORAL | Status: DC

## 2015-08-27 MED ORDER — SODIUM CHLORIDE 0.9 % IV SOLN: 500.0000 mL | INTRAVENOUS | Status: DC

## 2015-08-27 NOTE — Progress Notes (Signed)
Colonoscopy canceled, pt drank 8 oz of chicken broth at 1330pm today and a 12 oz bottle of water. Rescheduled procedure for Monday 09-02-15 at 2:00pm. Prep instructions were given to patient and daughter. Pt understood importance of not drinking liquids after 11:00am on procedure day, 09-02-15.

## 2015-09-02 ENCOUNTER — Telehealth: Payer: Self-pay | Admitting: Gastroenterology

## 2015-09-02 ENCOUNTER — Ambulatory Visit: Payer: Federal, State, Local not specified - PPO | Admitting: Gastroenterology

## 2015-09-02 NOTE — Telephone Encounter (Signed)
Yes, charge. If sick all weekend she could have called over weekend.

## 2015-09-04 ENCOUNTER — Telehealth: Payer: Self-pay

## 2015-09-04 ENCOUNTER — Telehealth: Payer: Self-pay | Admitting: Neurology

## 2015-09-04 MED ORDER — ZOLPIDEM TARTRATE 5 MG PO TABS: 5.0000 mg | ORAL_TABLET | Freq: Every evening | ORAL | Status: DC | PRN

## 2015-09-04 NOTE — Telephone Encounter (Signed)
See patient in intra-fusion suite, patient is preparing for colonoscopy, previous two preparation has caused increased weakness, she also complains of insomnia, despite taking clonazepam 1 mg every night  Ambien 5 mg every night as needed  Encourage her continue:Preparation and procedure, reviewed laboratory, anemia most recent hemoglobin was 11 point 5, previously was 10 point 1

## 2015-09-04 NOTE — Telephone Encounter (Signed)
Pt is needing to talk with chelle about her colonoscopy she has referred her for   Best number [phone]

## 2015-09-04 NOTE — Telephone Encounter (Signed)
I spoke to the patient in regards to the [employer] study. I asked the patient to please return the two bottles of Baclofen ER that she did not bring at her last visit. Patient stated that she will try to bring them soon.

## 2015-09-05 NOTE — Telephone Encounter (Signed)
Spoke with pt, she states they could not do the procedure because she drank water. She wants to know if this is necessary to do this. They want her to do the procedure again. She was wiped out and feels very weak. Please advise.

## 2015-09-06 ENCOUNTER — Telehealth: Payer: Self-pay

## 2015-09-06 NOTE — Telephone Encounter (Signed)
I think it's important that she have colon cancer screening. Colonoscopy is the gold standard.  She also is anemic, and that may be due to losing blood from the colon. So I also think it's important to investigate that.

## 2015-09-06 NOTE — Telephone Encounter (Signed)
That is fine. She can reschedule for mid-January, but don't wait until mid-January to reschedule!

## 2015-09-06 NOTE — Telephone Encounter (Signed)
Patient called to state that she is moving on January 1st, and she won't be able to make another appointment for a colonoscopy until mid-January.

## 2015-09-06 NOTE — Telephone Encounter (Signed)
Left voicemail on machine advising pt to get colonoscopy.

## 2015-09-16 ENCOUNTER — Encounter (HOSPITAL_COMMUNITY): Payer: Self-pay | Admitting: Emergency Medicine

## 2015-09-16 ENCOUNTER — Inpatient Hospital Stay (HOSPITAL_COMMUNITY)
Admission: EM | Admit: 2015-09-16 | Discharge: 2015-09-18 | DRG: 690 | Disposition: A | Discharge status: Home Health | Payer: Federal, State, Local not specified - PPO | Attending: Internal Medicine | Admitting: Internal Medicine

## 2015-09-16 ENCOUNTER — Emergency Department (HOSPITAL_COMMUNITY): Payer: Federal, State, Local not specified - PPO

## 2015-09-16 DIAGNOSIS — N390 Urinary tract infection, site not specified: Secondary | ICD-10-CM | POA: Diagnosis not present

## 2015-09-16 DIAGNOSIS — F05 Delirium due to known physiological condition: Secondary | ICD-10-CM

## 2015-09-16 DIAGNOSIS — Z87891 Personal history of nicotine dependence: Secondary | ICD-10-CM

## 2015-09-16 DIAGNOSIS — Z79899 Other long term (current) drug therapy: Secondary | ICD-10-CM

## 2015-09-16 DIAGNOSIS — I10 Essential (primary) hypertension: Secondary | ICD-10-CM | POA: Diagnosis present

## 2015-09-16 DIAGNOSIS — F419 Anxiety disorder, unspecified: Secondary | ICD-10-CM | POA: Diagnosis present

## 2015-09-16 DIAGNOSIS — E7800 Pure hypercholesterolemia, unspecified: Secondary | ICD-10-CM | POA: Diagnosis present

## 2015-09-16 DIAGNOSIS — I739 Peripheral vascular disease, unspecified: Secondary | ICD-10-CM | POA: Diagnosis present

## 2015-09-16 DIAGNOSIS — G35 Multiple sclerosis: Secondary | ICD-10-CM | POA: Diagnosis not present

## 2015-09-16 DIAGNOSIS — F329 Major depressive disorder, single episode, unspecified: Secondary | ICD-10-CM | POA: Diagnosis present

## 2015-09-16 LAB — CBC WITH DIFFERENTIAL/PLATELET
Basophils Absolute: 0 10*3/uL (ref 0.0–0.1)
Basophils Relative: 1 %
EOS ABS: 0.2 10*3/uL (ref 0.0–0.7)
Eosinophils Relative: 3 %
HEMATOCRIT: 37.1 % (ref 36.0–46.0)
HEMOGLOBIN: 12.1 g/dL (ref 12.0–15.0)
LYMPHS ABS: 1.6 10*3/uL (ref 0.7–4.0)
LYMPHS PCT: 29 %
MCH: 28.3 pg (ref 26.0–34.0)
MCHC: 32.6 g/dL (ref 30.0–36.0)
MCV: 86.9 fL (ref 78.0–100.0)
Monocytes Absolute: 0.5 10*3/uL (ref 0.1–1.0)
Monocytes Relative: 9 %
NEUTROS ABS: 3.2 10*3/uL (ref 1.7–7.7)
NEUTROS PCT: 58 %
Platelets: 196 10*3/uL (ref 150–400)
RBC: 4.27 MIL/uL (ref 3.87–5.11)
RDW: 16.6 % — ABNORMAL HIGH (ref 11.5–15.5)
WBC: 5.4 10*3/uL (ref 4.0–10.5)

## 2015-09-16 LAB — COMPREHENSIVE METABOLIC PANEL
ALT: 19 U/L (ref 14–54)
ANION GAP: 5 (ref 5–15)
AST: 27 U/L (ref 15–41)
Albumin: 3.6 g/dL (ref 3.5–5.0)
Alkaline Phosphatase: 76 U/L (ref 38–126)
BUN: 18 mg/dL (ref 6–20)
CHLORIDE: 110 mmol/L (ref 101–111)
CO2: 26 mmol/L (ref 22–32)
CREATININE: 1.09 mg/dL — AB (ref 0.44–1.00)
Calcium: 9.1 mg/dL (ref 8.9–10.3)
GFR, EST NON AFRICAN AMERICAN: 52 mL/min — AB (ref >60)
Glucose, Bld: 99 mg/dL (ref 65–99)
POTASSIUM: 4.8 mmol/L (ref 3.5–5.1)
SODIUM: 141 mmol/L (ref 135–145)
Total Bilirubin: 1 mg/dL (ref 0.3–1.2)
Total Protein: 7.2 g/dL (ref 6.5–8.1)

## 2015-09-16 LAB — I-STAT CHEM 8, ED
BUN: 18 mg/dL (ref 6–20)
CALCIUM ION: 1.23 mmol/L (ref 1.13–1.30)
CREATININE: 0.9 mg/dL (ref 0.44–1.00)
Chloride: 108 mmol/L (ref 101–111)
GLUCOSE: 95 mg/dL (ref 65–99)
HCT: 39 % (ref 36.0–46.0)
HEMOGLOBIN: 13.3 g/dL (ref 12.0–15.0)
Potassium: 4.1 mmol/L (ref 3.5–5.1)
Sodium: 144 mmol/L (ref 135–145)
TCO2: 26 mmol/L (ref 0–100)

## 2015-09-16 LAB — URINE MICROSCOPIC-ADD ON

## 2015-09-16 LAB — ETHANOL

## 2015-09-16 LAB — URINALYSIS, ROUTINE W REFLEX MICROSCOPIC
BILIRUBIN URINE: NEGATIVE
Glucose, UA: NEGATIVE mg/dL
KETONES UR: NEGATIVE mg/dL
NITRITE: POSITIVE — AB
Protein, ur: NEGATIVE mg/dL
SPECIFIC GRAVITY, URINE: 1.018 (ref 1.005–1.030)
pH: 5.5 (ref 5.0–8.0)

## 2015-09-16 LAB — I-STAT CG4 LACTIC ACID, ED: LACTIC ACID, VENOUS: 0.94 mmol/L (ref 0.5–2.0)

## 2015-09-16 LAB — CBG MONITORING, ED: Glucose-Capillary: 90 mg/dL (ref 65–99)

## 2015-09-16 LAB — AMMONIA: AMMONIA: 48 umol/L — AB (ref 9–35)

## 2015-09-16 MED ORDER — DOCUSATE SODIUM 100 MG PO CAPS
100.0000 mg | ORAL_CAPSULE | Freq: Two times a day (BID) | ORAL | Status: DC
  Administered 2015-09-16 – 2015-09-18 (×4): 100 mg via ORAL
  Filled 2015-09-16 (×6): qty 1

## 2015-09-16 MED ORDER — MELOXICAM 15 MG PO TABS
15.0000 mg | ORAL_TABLET | Freq: Every day | ORAL | Status: DC
  Administered 2015-09-16 – 2015-09-18 (×3): 15 mg via ORAL
  Filled 2015-09-16 (×3): qty 1

## 2015-09-16 MED ORDER — VITAMIN D3 25 MCG (1000 UNIT) PO TABS
2000.0000 [IU] | ORAL_TABLET | Freq: Every day | ORAL | Status: DC
  Administered 2015-09-16 – 2015-09-18 (×3): 2000 [IU] via ORAL
  Filled 2015-09-16 (×3): qty 2

## 2015-09-16 MED ORDER — SODIUM CHLORIDE 0.9 % IV SOLN
1000.0000 mL | Freq: Once | INTRAVENOUS | Status: AC
  Administered 2015-09-16: 1000 mL via INTRAVENOUS

## 2015-09-16 MED ORDER — ACETAMINOPHEN 325 MG PO TABS: 650.0000 mg | ORAL_TABLET | Freq: Four times a day (QID) | ORAL | Status: DC | PRN

## 2015-09-16 MED ORDER — LAMOTRIGINE ER 100 MG PO TB24
200.0000 mg | ORAL_TABLET | Freq: Every day | ORAL | Status: DC
  Administered 2015-09-16 – 2015-09-17 (×2): 200 mg via ORAL
  Filled 2015-09-16 (×4): qty 2

## 2015-09-16 MED ORDER — SODIUM CHLORIDE 0.9 % IV SOLN
INTRAVENOUS | Status: DC
  Administered 2015-09-16: 18:00:00 via INTRAVENOUS

## 2015-09-16 MED ORDER — ONDANSETRON HCL 4 MG PO TABS: 4.0000 mg | ORAL_TABLET | Freq: Four times a day (QID) | ORAL | Status: DC | PRN

## 2015-09-16 MED ORDER — MORPHINE SULFATE (PF) 2 MG/ML IV SOLN
1.0000 mg | INTRAVENOUS | Status: DC | PRN
  Administered 2015-09-17: 1 mg via INTRAVENOUS
  Filled 2015-09-16: qty 1

## 2015-09-16 MED ORDER — DEXTROSE 5 % IV SOLN: 1.0000 g | INTRAVENOUS | Status: DC

## 2015-09-16 MED ORDER — STUDY - INVESTIGATIONAL MEDICATION
80.0000 mg | Freq: Every day | Status: DC
  Administered 2015-09-16 – 2015-09-17 (×2): 80 mg via ORAL

## 2015-09-16 MED ORDER — CEFTRIAXONE SODIUM 1 G IJ SOLR
1.0000 g | INTRAMUSCULAR | Status: DC
  Administered 2015-09-17: 1 g via INTRAVENOUS
  Filled 2015-09-16 (×2): qty 10

## 2015-09-16 MED ORDER — ENOXAPARIN SODIUM 40 MG/0.4ML ~~LOC~~ SOLN
40.0000 mg | SUBCUTANEOUS | Status: DC
  Administered 2015-09-16 – 2015-09-17 (×2): 40 mg via SUBCUTANEOUS
  Filled 2015-09-16 (×3): qty 0.4

## 2015-09-16 MED ORDER — MODAFINIL 200 MG PO TABS
200.0000 mg | ORAL_TABLET | Freq: Every day | ORAL | Status: DC
  Administered 2015-09-16 – 2015-09-18 (×3): 200 mg via ORAL
  Filled 2015-09-16 (×3): qty 1

## 2015-09-16 MED ORDER — IBUPROFEN 800 MG PO TABS
800.0000 mg | ORAL_TABLET | Freq: Three times a day (TID) | ORAL | Status: DC | PRN
  Administered 2015-09-18: 800 mg via ORAL
  Filled 2015-09-16: qty 1

## 2015-09-16 MED ORDER — ONDANSETRON HCL 4 MG/2ML IJ SOLN: 4.0000 mg | Freq: Four times a day (QID) | INTRAMUSCULAR | Status: DC | PRN

## 2015-09-16 MED ORDER — ACETAMINOPHEN 650 MG RE SUPP: 650.0000 mg | Freq: Four times a day (QID) | RECTAL | Status: DC | PRN

## 2015-09-16 MED ORDER — DEXTROSE 5 % IV SOLN
1.0000 g | Freq: Once | INTRAVENOUS | Status: AC
  Administered 2015-09-16: 1 g via INTRAVENOUS
  Filled 2015-09-16: qty 10

## 2015-09-16 MED ORDER — ZOLPIDEM TARTRATE 5 MG PO TABS
5.0000 mg | ORAL_TABLET | Freq: Every evening | ORAL | Status: DC | PRN
  Administered 2015-09-16 – 2015-09-17 (×2): 5 mg via ORAL
  Filled 2015-09-16 (×2): qty 1

## 2015-09-16 MED ORDER — ATORVASTATIN CALCIUM 20 MG PO TABS
20.0000 mg | ORAL_TABLET | Freq: Every day | ORAL | Status: DC
  Administered 2015-09-16 – 2015-09-17 (×2): 20 mg via ORAL
  Filled 2015-09-16 (×3): qty 1

## 2015-09-16 MED ORDER — BACLOFEN 20 MG PO TABS: 80.0000 mg | ORAL_TABLET | Freq: Every day | ORAL | Status: DC

## 2015-09-16 MED ORDER — HYDROCODONE-ACETAMINOPHEN 5-325 MG PO TABS: 1.0000 | ORAL_TABLET | ORAL | Status: DC | PRN

## 2015-09-16 NOTE — ED Notes (Signed)
Awake. Verbally responsive. A/O x4. Resp even and unlabored. No audible adventitious breath sounds noted. ABC's intact. SR on monitor. IV saline lock patent and intact. 

## 2015-09-16 NOTE — ED Provider Notes (Signed)
CSN: [id number]     Arrival date & time 09/16/15  1159 History   First MD Initiated Contact with Patient 09/16/15 1202     Chief Complaint  Patient presents with  . Altered Mental Status     (Consider location/radiation/quality/duration/timing/severity/associated sxs/prior Treatment) Patient is a 64 y.o. female presenting with altered mental status. The history is provided by the patient and a relative.  Altered Mental Status Presenting symptoms: confusion   Severity:  Mild Most recent episode:  2 days ago Episode history:  Continuous Duration:  2 days Timing:  Intermittent Progression:  Waxing and waning Chronicity:  New Context comment:  MS Associated symptoms: weakness (generalized)   Associated symptoms: no fever, no headaches, no nausea, no palpitations and no vomiting      64 yo F with a chief complaint of confusion. This been going on for the past couple days. Associated with weakness. Patient has a history of multiple sclerosis. Feels that this is different from her normal flares. Denies fever chills denies cough or congestion denies dysuria or increased frequency or hesitancy. Her confusion has been coming and going. Her weakness is generalized in slowly improving over the past couple hours.  Past Medical History  Diagnosis Date  . Elevated cholesterol   . Hypertension   . Arthritis   . Cervical dysplasia   . Depression   . Anxiety   . Macular degeneration of right eye 2001  . MS (multiple sclerosis) ([place]) 08/2013  . Acute encephalopathy 03/12/2015   Past Surgical History  Procedure Laterality Date  . Colposcopy    . Knee surgery Bilateral     "had cortisone injections in my knees"  . Cholecystectomy    . Tubal ligation    . Tonsillectomy    . Colonoscopy w/ polypectomy     Family History  Problem Relation Age of Onset  . Cancer Mother     Colon  . Diabetes Mother   . Hypertension Mother   . Arthritis Mother   . Cancer Father     prostate  . Kidney  disease Brother     congenital single kidney  . Arthritis Sister   . Arthritis Sister   . Hematuria Son   . Gout Brother   . Multiple sclerosis Brother   . Arthritis Brother   . HIV Brother   . Cancer Brother     spinal   Social History  Substance Use Topics  . Smoking status: Former Smoker -- 1.00 packs/day for 45 years    Types: Cigarettes    Quit date: 10/23/2013  . Smokeless tobacco: Never Used     Comment: Quit smoking in 2014.  Marland Kitchen Alcohol Use: 0.0 oz/week    0 Standard drinks or equivalent per week     Comment: hasn't drank anything since jan 2015   OB History    Gravida Para Term Preterm AB TAB SAB Ectopic Multiple Living   _0 Review of Systems  Constitutional: Positive for activity change. Negative for fever and chills.  HENT: Negative for congestion and rhinorrhea.   Eyes: Negative for redness and visual disturbance.  Respiratory: Negative for shortness of breath and wheezing.   Cardiovascular: Negative for chest pain and palpitations.  Gastrointestinal: Negative for nausea and vomiting.  Genitourinary: Negative for dysuria and urgency.  Musculoskeletal: Negative for myalgias and arthralgias.  Skin: Negative for pallor and wound.  Neurological: Positive for weakness (generalized). Negative for  dizziness and headaches.  Psychiatric/Behavioral: Positive for confusion.      Allergies  Review of patient's allergies indicates no known allergies.  Home Medications   Prior to Admission medications   Medication Sig Start Date End Date Taking? Authorizing Provider  atorvastatin (LIPITOR) 20 MG tablet take 1 tablet by mouth once daily 05/28/14  Yes [name] Jeffery, PA-C  BACLOFEN PO Take 80 mg by mouth at bedtime. Patient is on a study drug with NEURO- Baclofen ER 40 MG, takes 80 mg at night   Yes Historical Provider, MD  Cholecalciferol (VITAMIN D3) 2000 UNITS capsule Take 2,000 Units by mouth daily.   Yes Historical Provider, MD  ibuprofen  (ADVIL,MOTRIN) 200 MG tablet Take 800 mg by mouth 3 (three) times daily as needed for headache.   Yes Historical Provider, MD  LamoTRIgine (LAMICTAL XR) 100 MG TB24 2 tabs po qhs 08/05/15  Yes Marcial Pacas, MD  meloxicam (MOBIC) 15 MG tablet take 1 tablet by mouth once daily 06/12/15  Yes Marcial Pacas, MD  modafinil (PROVIGIL) 200 MG tablet Take 1 tablet (200 mg total) by mouth daily. 02/26/15  Yes Marcial Pacas, MD  natalizumab (TYSABRI) 300 MG/15ML injection Inject 300 mg into the vein every 30 (thirty) days. 2nd of each month   Yes Historical Provider, MD  Omega-3 Fatty Acids (FISH OIL PO) Take 1 tablet by mouth daily.   Yes Historical Provider, MD  OVER THE COUNTER MEDICATION Take 1 capsule by mouth 2 (two) times daily. Neuro Optimizer vitamins   Yes Historical Provider, MD  VITAMIN E PO Take 1 tablet by mouth daily.   Yes Historical Provider, MD  zolpidem (AMBIEN) 5 MG tablet Take 1 tablet (5 mg total) by mouth at bedtime as needed for sleep. 09/04/15  Yes Marcial Pacas, MD  clonazePAM (KLONOPIN) 1 MG tablet take 1 tablet by mouth twice a day if needed for anxiety 05/27/15   Marcial Pacas, MD  LamoTRIgine (LAMICTAL XR) 25 MG TB24 tablet One tab po qhs xone week, then 2 tabs po qhs xone week, then 3 tabs po qhs. 08/05/15   Marcial Pacas, MD  Na Sulfate-K Sulfate-Mg Sulf (SUPREP BOWEL PREP) SOLN Take 1 kit by mouth once. Name brand only, no substitutions, take as directed 08/27/15   Ladene Artist, MD   BP 107/76 mmHg  Pulse 68  Temp(Src) 98.2 F (36.8 C) (Oral)  Resp 16  SpO2 99% Physical Exam  Constitutional: She is oriented to person, place, and time. She appears well-developed and well-nourished. No distress.  HENT:  Head: Normocephalic and atraumatic.  Eyes: EOM are normal. Pupils are equal, round, and reactive to light.  Neck: Normal range of motion. Neck supple.  Cardiovascular: Normal rate and regular rhythm.  Exam reveals no gallop and no friction rub.   No murmur heard. Pulmonary/Chest: Effort normal.  She has no wheezes. She has no rales.  Abdominal: Soft. She exhibits no distension. There is no tenderness.  Musculoskeletal: She exhibits no edema or tenderness.  Neurological: She is alert and oriented to person, place, and time. No cranial nerve deficit or sensory deficit. GCS eye subscore is 4. GCS verbal subscore is 5. GCS motor subscore is 6. She displays no Babinski's sign on the right side. She displays no Babinski's sign on the left side.  Reflex Scores:      Tricep reflexes are 2+ on the right side and 2+ on the left side.      Bicep reflexes are 2+ on the right side  and 2+ on the left side.      Brachioradialis reflexes are 2+ on the right side and 2+ on the left side.      Patellar reflexes are 1+ on the right side and 1+ on the left side.      Achilles reflexes are 1+ on the right side and 1+ on the left side. Right upper extremity 4 out of 5 compared to left upper extremity 5 out of 5. Right lower extremity 1 out of 5 left lower externally 4 out of 5.  Skin: Skin is warm and dry. She is not diaphoretic.  Psychiatric: She has a normal mood and affect. Her behavior is normal.  Nursing note and vitals reviewed.   ED Course  Procedures (including critical care time) Labs Review Labs Reviewed  AMMONIA - Abnormal; Notable for the following:    Ammonia 48 (*)    All other components within normal limits  COMPREHENSIVE METABOLIC PANEL - Abnormal; Notable for the following:    Creatinine, Ser 1.09 (*)    GFR calc non Af Amer 52 (*)    All other components within normal limits  CBC WITH DIFFERENTIAL/PLATELET - Abnormal; Notable for the following:    RDW 16.6 (*)    All other components within normal limits  URINALYSIS, ROUTINE W REFLEX MICROSCOPIC (NOT AT [facility]) - Abnormal; Notable for the following:    APPearance CLOUDY (*)    Hgb urine dipstick TRACE (*)    Nitrite POSITIVE (*)    Leukocytes, UA LARGE (*)    All other components within normal limits  URINE MICROSCOPIC-ADD ON  - Abnormal; Notable for the following:    Squamous Epithelial / LPF 0-5 (*)    Bacteria, UA MANY (*)    All other components within normal limits  URINE CULTURE  ETHANOL  I-STAT CHEM 8, ED  I-STAT CG4 LACTIC ACID, ED  CBG MONITORING, ED    Imaging Review Dg Chest 2 View  09/16/2015  CLINICAL DATA:  Confusion and altered mental status. EXAM: CHEST  2 VIEW COMPARISON:  07/29/2015 and 03/12/2015 FINDINGS: Prominent linear density at the left lung base is suggestive for some atelectasis. Otherwise, the lungs are clear. Stable appearance of the heart and mediastinum. The thoracic aorta appears tortuous and similar to the prior examinations. No pleural effusions. Mild degenerative changes in the thoracic spine. IMPRESSION: Probable left basilar atelectasis. Otherwise, no acute chest findings. Electronically Signed   By: Markus Daft M.D.   On: 09/16/2015 12:57   Ct Head Wo Contrast  09/16/2015  CLINICAL DATA:  Confusion, altered mental status. EXAM: CT HEAD WITHOUT CONTRAST TECHNIQUE: Contiguous axial images were obtained from the base of the skull through the vertex without intravenous contrast. COMPARISON:  07/29/2015 FINDINGS: Mild chronic small vessel disease changes throughout the deep white matter. No acute intracranial abnormality. Specifically, no hemorrhage, hydrocephalus, mass lesion, acute infarction, or significant intracranial injury. No acute calvarial abnormality. Visualized paranasal sinuses and mastoids clear. Orbital soft tissues unremarkable. IMPRESSION: No acute intracranial abnormality. Chronic small vessel disease, stable. Electronically Signed   By: Rolm Baptise M.D.   On: 09/16/2015 13:16   I have personally reviewed and evaluated these images and lab results as part of my medical decision-making.   EKG Interpretation None      MDM   Final diagnoses:  UTI (lower urinary tract infection)  Acute confusional state   64 yo F with a chief complaint of weakness and  confusion. Patient states that her right-sided  weakness is normal for her. She feels that she is at her baseline currently. UA positive for UTI. Will treat with Rocephin. Discussed the case with neurology, Dr Leonel Ramsay,  feels with fluctuating mental status is more likely to be metabolic or infectious in nature.  Patient reassessed and still at her baseline. Unable to ambulate will admit.  The patients results and plan were reviewed and discussed.   Any x-rays performed were independently reviewed by myself.   Differential diagnosis were considered with the presenting HPI.  Medications  cefTRIAXone (ROCEPHIN) 1 g in dextrose 5 % 50 mL IVPB (not administered)  0.9 %  sodium chloride infusion (0 mLs Intravenous Stopped 09/16/15 1330)    Filed Vitals:   09/16/15 1300 09/16/15 1315 09/16/15 1330 09/16/15 1337  BP: 127/69  107/76 107/76  Pulse:  65 66 68  Temp:      TempSrc:      Resp:  _0 SpO2:  98% 98% 99%    Final diagnoses:  UTI (lower urinary tract infection)  Acute confusional state    Admission/ observation were discussed with the admitting physician, patient and/or family and they are comfortable with the plan.       Deno Etienne, DO 09/16/15 1546

## 2015-09-16 NOTE — ED Notes (Signed)
Bed: [id number] Expected date:  Expected time:  Means of arrival:  Comments: EMS- 64yo F, AMS/Hx of MS

## 2015-09-16 NOTE — ED Notes (Signed)
Awake. Verbally responsive. A/O x4 with intermittent confusion. Resp even and unlabored. No audible adventitious breath sounds noted. ABC's intact. SR on monitor. IV saline lock patent and intact. Family at bedside.

## 2015-09-16 NOTE — ED Notes (Signed)
Pt arrived via EMS from home with report of increase confusion, AMS and foul odor urine. Pt denies fever, abd/lower back pain/pressure or hematuria. Pt reported urinary frequency/urgency, and discolored urine

## 2015-09-16 NOTE — ED Notes (Signed)
Chaplain provided education around advance directives.  Pt completed healthcare power of attorney and living will.  Chaplain notarized.   Copy and original with pt.  Copy in chart on unit.  Copy given to medical records to be scanned into EPIC.    [name], [name]

## 2015-09-16 NOTE — H&P (Signed)
[facility] History and Physical  Wanda [name] TWK:[id number] DOB: 1951-01-01 DOA: 09/16/2015  Referring physician: Dr. Tyrone Nine PCP: JEFFERY,[name], PA-C   Chief Complaint: Generalized weakness  HPI: Wanda [name] is a 64 y.o. female with past medical history significant for hypertension, MS, at baseline patient is able to you walk with a walker, she has chronic right-sided weakness. She presents today with worsening generalized weakness. She has not been able to ambulate since 2 days prior to admission. She denies dysuria, does relate increased urinary frequency. Denies fever or chills, no chest pain no shortness of breath no abdominal pain. She is on natalizumab for MS. ED physician discussed with neurology, generalized weakness is considered to be secondary to UTI and less likely to MS flare, due to waxing and waning symptoms.  Evaluation in the ED; UA with too numerous to count white blood cell, positive nitrates. CT head no acute intracranial abnormalities.  Review of Systems:  Negative except as per HPI  Past Medical History  Diagnosis Date  . Elevated cholesterol   . Hypertension   . Arthritis   . Cervical dysplasia   . Depression   . Anxiety   . Macular degeneration of right eye 2001  . MS (multiple sclerosis) ([place]) 08/2013  . Acute encephalopathy 03/12/2015   Past Surgical History  Procedure Laterality Date  . Colposcopy    . Knee surgery Bilateral     "had cortisone injections in my knees"  . Cholecystectomy    . Tubal ligation    . Tonsillectomy    . Colonoscopy w/ polypectomy     Social History:  reports that she quit smoking about 22 months ago. Her smoking use included Cigarettes. She has a 45 pack-year smoking history. She has never used smokeless tobacco. She reports that she drinks alcohol. She reports that she does not use illicit drugs.  No Known Allergies  Family History  Problem Relation Age of Onset  . Cancer Mother     Colon  . Diabetes  Mother   . Hypertension Mother   . Arthritis Mother   . Cancer Father     prostate  . Kidney disease Brother     congenital single kidney  . Arthritis Sister   . Arthritis Sister   . Hematuria Son   . Gout Brother   . Multiple sclerosis Brother   . Arthritis Brother   . HIV Brother   . Cancer Brother     spinal    Prior to Admission medications   Medication Sig Start Date End Date Taking? Authorizing Provider  atorvastatin (LIPITOR) 20 MG tablet take 1 tablet by mouth once daily 05/28/14  Yes [name] Jeffery, PA-C  BACLOFEN PO Take 80 mg by mouth at bedtime. Patient is on a study drug with NEURO- Baclofen ER 40 MG, takes 80 mg at night   Yes Historical Provider, MD  Cholecalciferol (VITAMIN D3) 2000 UNITS capsule Take 2,000 Units by mouth daily.   Yes Historical Provider, MD  ibuprofen (ADVIL,MOTRIN) 200 MG tablet Take 800 mg by mouth 3 (three) times daily as needed for headache.   Yes Historical Provider, MD  LamoTRIgine (LAMICTAL XR) 100 MG TB24 2 tabs po qhs 08/05/15  Yes Marcial Pacas, MD  meloxicam (MOBIC) 15 MG tablet take 1 tablet by mouth once daily 06/12/15  Yes Marcial Pacas, MD  modafinil (PROVIGIL) 200 MG tablet Take 1 tablet (200 mg total) by mouth daily. 02/26/15  Yes Marcial Pacas, MD  natalizumab (TYSABRI) 300 MG/15ML  injection Inject 300 mg into the vein every 30 (thirty) days. 2nd of each month   Yes Historical Provider, MD  Omega-3 Fatty Acids (FISH OIL PO) Take 1 tablet by mouth daily.   Yes Historical Provider, MD  OVER THE COUNTER MEDICATION Take 1 capsule by mouth 2 (two) times daily. Neuro Optimizer vitamins   Yes Historical Provider, MD  VITAMIN E PO Take 1 tablet by mouth daily.   Yes Historical Provider, MD  zolpidem (AMBIEN) 5 MG tablet Take 1 tablet (5 mg total) by mouth at bedtime as needed for sleep. 09/04/15  Yes Marcial Pacas, MD  clonazePAM (KLONOPIN) 1 MG tablet take 1 tablet by mouth twice a day if needed for anxiety 05/27/15   Marcial Pacas, MD  LamoTRIgine (LAMICTAL XR)  25 MG TB24 tablet One tab po qhs xone week, then 2 tabs po qhs xone week, then 3 tabs po qhs. 08/05/15   Marcial Pacas, MD  Na Sulfate-K Sulfate-Mg Sulf (SUPREP BOWEL PREP) SOLN Take 1 kit by mouth once. Name brand only, no substitutions, take as directed 08/27/15   Ladene Artist, MD   Physical Exam: Filed Vitals:   09/16/15 1300 09/16/15 1315 09/16/15 1330 09/16/15 1337  BP: 127/69  107/76 107/76  Pulse:  65 66 68  Temp:      TempSrc:      Resp:  _0 SpO2:  98% 98% 99%    Wt Readings from Last 3 Encounters:  08/27/15 55.792 kg (123 lb)  08/06/15 57.607 kg (127 lb)  08/05/15 55.339 kg (122 lb)    General:  Appears calm and comfortable Eyes: PERRL, normal lids, irises & conjunctiva ENT: grossly normal hearing, lips & tongue Neck: no LAD, masses or thyromegaly Cardiovascular: RRR, no m/r/g. No LE edema. Telemetry: SR, no arrhythmias  Respiratory: CTA bilaterally, no w/r/r. Normal respiratory effort. Abdomen: soft, ntnd Skin: no rash or induration seen on limited exam Musculoskeletal: grossly normal tone BUE/BLE Psychiatric: grossly normal mood and affect, speech fluent and appropriate Neurologic: Alert in not distress oriented right side with hemiparesis, right upper extremity with rigidity. No significant left sided weakness.           Labs on Admission:  Basic Metabolic Panel:  Recent Labs Lab 09/16/15 1213 09/16/15 1247  NA 141 144  K 4.8 4.1  CL 110 108  CO2 26  --   GLUCOSE 99 95  BUN 18 18  CREATININE 1.09* 0.90  CALCIUM 9.1  --    Liver Function Tests:  Recent Labs Lab 09/16/15 1213  AST 27  ALT 19  ALKPHOS 76  BILITOT 1.0  PROT 7.2  ALBUMIN 3.6   No results for input(s): LIPASE, AMYLASE in the last 168 hours.  Recent Labs Lab 09/16/15 1213  AMMONIA 48*   CBC:  Recent Labs Lab 09/16/15 1213 09/16/15 1247  WBC 5.4  --   NEUTROABS 3.2  --   HGB 12.1 13.3  HCT 37.1 39.0  MCV 86.9  --   PLT 196  --    Cardiac Enzymes: No results  for input(s): CKTOTAL, CKMB, CKMBINDEX, TROPONINI in the last 168 hours.  BNP (last 3 results) No results for input(s): BNP in the last 8760 hours.  ProBNP (last 3 results) No results for input(s): PROBNP in the last 8760 hours.  CBG:  Recent Labs Lab 09/16/15 1229  GLUCAP 90    Radiological Exams on Admission: Dg Chest 2 View  09/16/2015  CLINICAL DATA:  Confusion and  altered mental status. EXAM: CHEST  2 VIEW COMPARISON:  07/29/2015 and 03/12/2015 FINDINGS: Prominent linear density at the left lung base is suggestive for some atelectasis. Otherwise, the lungs are clear. Stable appearance of the heart and mediastinum. The thoracic aorta appears tortuous and similar to the prior examinations. No pleural effusions. Mild degenerative changes in the thoracic spine. IMPRESSION: Probable left basilar atelectasis. Otherwise, no acute chest findings. Electronically Signed   By: Markus Daft M.D.   On: 09/16/2015 12:57   Ct Head Wo Contrast  09/16/2015  CLINICAL DATA:  Confusion, altered mental status. EXAM: CT HEAD WITHOUT CONTRAST TECHNIQUE: Contiguous axial images were obtained from the base of the skull through the vertex without intravenous contrast. COMPARISON:  07/29/2015 FINDINGS: Mild chronic small vessel disease changes throughout the deep white matter. No acute intracranial abnormality. Specifically, no hemorrhage, hydrocephalus, mass lesion, acute infarction, or significant intracranial injury. No acute calvarial abnormality. Visualized paranasal sinuses and mastoids clear. Orbital soft tissues unremarkable. IMPRESSION: No acute intracranial abnormality. Chronic small vessel disease, stable. Electronically Signed   By: Rolm Baptise M.D.   On: 09/16/2015 13:16    EKG: Independently reviewed. None available  Assessment/Plan Active Problems:   Hypertension   Multiple sclerosis ([place])   UTI (lower urinary tract infection)  1-Worsening generalized weakness;  Could be related to UTI.  Treated for UTI if symptoms does not improve my needing no neurology evaluation for MS .  PT consult.  2-UTI; Patient's present with generalized weakness, UA with too numerous to count white counts and positive nitrates, increased urinary frequency. IV ceftriaxone. Follow urine culture.  3-MS; Continue with Lamictal, baclofen. Next dose of TYSABRI due 12-28.    Code Status: Full code DVT Prophylaxis: Lovenox Family Communication: Care discussed  with daughter who was at bedside Disposition Plan: Suspect 2-3 days inpatient.  Time spent: 75 minutes.   Niel Hummer A [facility] Pager [phone]

## 2015-09-16 NOTE — ED Notes (Signed)
Awake. Verbally responsive. A/O x4 with intermittent confusion. Resp even and unlabored. No audible adventitious breath sounds noted. ABC's intact. SR on monitor. IV infusing Rocephin without adverse effects noted. Family at bedside.

## 2015-09-16 NOTE — ED Notes (Signed)
Pt cannot use restroom at this time, aware specimen is needed. 

## 2015-09-17 ENCOUNTER — Encounter: Payer: Federal, State, Local not specified - PPO | Admitting: Gastroenterology

## 2015-09-17 DIAGNOSIS — N390 Urinary tract infection, site not specified: Secondary | ICD-10-CM | POA: Diagnosis present

## 2015-09-17 DIAGNOSIS — E7800 Pure hypercholesterolemia, unspecified: Secondary | ICD-10-CM | POA: Diagnosis present

## 2015-09-17 DIAGNOSIS — G35 Multiple sclerosis: Secondary | ICD-10-CM | POA: Diagnosis present

## 2015-09-17 DIAGNOSIS — F329 Major depressive disorder, single episode, unspecified: Secondary | ICD-10-CM | POA: Diagnosis present

## 2015-09-17 DIAGNOSIS — I10 Essential (primary) hypertension: Secondary | ICD-10-CM | POA: Diagnosis present

## 2015-09-17 DIAGNOSIS — Z79899 Other long term (current) drug therapy: Secondary | ICD-10-CM | POA: Diagnosis not present

## 2015-09-17 DIAGNOSIS — I739 Peripheral vascular disease, unspecified: Secondary | ICD-10-CM | POA: Diagnosis present

## 2015-09-17 DIAGNOSIS — F419 Anxiety disorder, unspecified: Secondary | ICD-10-CM | POA: Diagnosis present

## 2015-09-17 DIAGNOSIS — Z87891 Personal history of nicotine dependence: Secondary | ICD-10-CM | POA: Diagnosis not present

## 2015-09-17 LAB — CBC
HCT: 34.4 % — ABNORMAL LOW (ref 36.0–46.0)
Hemoglobin: 11.2 g/dL — ABNORMAL LOW (ref 12.0–15.0)
MCH: 27.5 pg (ref 26.0–34.0)
MCHC: 32.6 g/dL (ref 30.0–36.0)
MCV: 84.5 fL (ref 78.0–100.0)
PLATELETS: 162 10*3/uL (ref 150–400)
RBC: 4.07 MIL/uL (ref 3.87–5.11)
RDW: 15.8 % — AB (ref 11.5–15.5)
WBC: 4.7 10*3/uL (ref 4.0–10.5)

## 2015-09-17 LAB — TSH: TSH: 1.211 u[IU]/mL (ref 0.350–4.500)

## 2015-09-17 LAB — BASIC METABOLIC PANEL
Anion gap: 7 (ref 5–15)
BUN: 15 mg/dL (ref 6–20)
CALCIUM: 8.8 mg/dL — AB (ref 8.9–10.3)
CO2: 21 mmol/L — AB (ref 22–32)
CREATININE: 1 mg/dL (ref 0.44–1.00)
Chloride: 111 mmol/L (ref 101–111)
GFR calc non Af Amer: 58 mL/min — ABNORMAL LOW (ref >60)
Glucose, Bld: 96 mg/dL (ref 65–99)
Potassium: 3.5 mmol/L (ref 3.5–5.1)
SODIUM: 139 mmol/L (ref 135–145)

## 2015-09-17 MED ORDER — LACTULOSE 10 GM/15ML PO SOLN
20.0000 g | Freq: Two times a day (BID) | ORAL | Status: DC
  Administered 2015-09-17 (×2): 20 g via ORAL
  Filled 2015-09-17 (×4): qty 30

## 2015-09-17 NOTE — Progress Notes (Signed)
[facility] PROGRESS NOTE  JANILA SYLLA [id number] DOB: [birth year] DOA: 09/16/2015 PCP: Harrison Mons, PA-C  Assessment/Plan: Wanda [name] is a 64 y.o. female with past medical history significant for hypertension, MS, at baseline patient is able to you walk with a walker, she has chronic right-sided weakness. She presents today with worsening generalized weakness. She has not been able to ambulate since 2 days prior to admission. She denies dysuria, does relate increased urinary frequency. Denies fever or chills, no chest pain no shortness of breath no abdominal pain. She is on natalizumab for MS. ED physician discussed with neurology, generalized weakness is considered to be secondary to UTI and less likely to MS flare, due to waxing and waning symptoms.  1-Worsening generalized weakness;  Could be related to UTI. Treated for UTI if symptoms does not improve my needing no neurology evaluation for MS .  PT consult. Improving.  Mild confusion, mildly elevated ammonia. Will start lactulose.   2-UTI; Patient's present with generalized weakness, UA with too numerous to count white counts and positive nitrates, increased urinary frequency. Continue ceftriaxone, day 2.  Follow urine culture.  3-MS; weakness improved.  Continue with Lamictal, baclofen. Next dose of TYSABRI due 12-28.   Code Status: Full code.  Family Communication: care discussed with patient.  Disposition Plan: home in 24 hour when urine culture available.    Consultants:  none  Procedures:  none  Antibiotics:  Ceftriaxone  HPI/Subjective: She is feeling better, better spirit,feels less weak. Able to move right upper extremity a little better.   Objective: Filed Vitals:   09/16/15 2146 09/17/15 0419  BP: 137/76 154/83  Pulse: 78 75  Temp: 98.6 F (37 C) 98.4 F (36.9 C)  Resp: 18 18    Intake/Output Summary (Last 24 hours) at 09/17/15 0743 Last data filed at 09/16/15 1828  Gross per  24 hour  Intake    240 ml  Output      0 ml  Net    240 ml   There were no vitals filed for this visit.  Exam:   General:  NAD  Cardiovascular: S 1, S 2 RRR  Respiratory: CTA  Abdomen: BS present, soft, nt  Musculoskeletal: no edema  Neuro; old right side Hemiiparesis,   Data Reviewed: Basic Metabolic Panel:  Recent Labs Lab 09/16/15 1213 09/16/15 1247 09/17/15 0547  NA 141 144 139  K 4.8 4.1 3.5  CL 110 108 111  CO2 26  --  21*  GLUCOSE 99 95 96  BUN 18 18 15   CREATININE 1.09* 0.90 1.00  CALCIUM 9.1  --  8.8*   Liver Function Tests:  Recent Labs Lab 09/16/15 1213  AST 27  ALT 19  ALKPHOS 76  BILITOT 1.0  PROT 7.2  ALBUMIN 3.6   No results for input(s): LIPASE, AMYLASE in the last 168 hours.  Recent Labs Lab 09/16/15 1213  AMMONIA 48*   CBC:  Recent Labs Lab 09/16/15 1213 09/16/15 1247 09/17/15 0547  WBC 5.4  --  4.7  NEUTROABS 3.2  --   --   HGB 12.1 13.3 11.2*  HCT 37.1 39.0 34.4*  MCV 86.9  --  84.5  PLT 196  --  162   Cardiac Enzymes: No results for input(s): CKTOTAL, CKMB, CKMBINDEX, TROPONINI in the last 168 hours. BNP (last 3 results) No results for input(s): BNP in the last 8760 hours.  ProBNP (last 3 results) No results for input(s): PROBNP in the last 8760 hours.  CBG:  Recent Labs Lab 09/16/15 1229  GLUCAP 90    No results found for this or any previous visit (from the past 240 hour(s)).   Studies: Dg Chest 2 View  09/16/2015  CLINICAL DATA:  Confusion and altered mental status. EXAM: CHEST  2 VIEW COMPARISON:  07/29/2015 and 03/12/2015 FINDINGS: Prominent linear density at the left lung base is suggestive for some atelectasis. Otherwise, the lungs are clear. Stable appearance of the heart and mediastinum. The thoracic aorta appears tortuous and similar to the prior examinations. No pleural effusions. Mild degenerative changes in the thoracic spine. IMPRESSION: Probable left basilar atelectasis. Otherwise, no  acute chest findings. Electronically Signed   By: Markus Daft M.D.   On: 09/16/2015 12:57   Ct Head Wo Contrast  09/16/2015  CLINICAL DATA:  Confusion, altered mental status. EXAM: CT HEAD WITHOUT CONTRAST TECHNIQUE: Contiguous axial images were obtained from the base of the skull through the vertex without intravenous contrast. COMPARISON:  07/29/2015 FINDINGS: Mild chronic small vessel disease changes throughout the deep white matter. No acute intracranial abnormality. Specifically, no hemorrhage, hydrocephalus, mass lesion, acute infarction, or significant intracranial injury. No acute calvarial abnormality. Visualized paranasal sinuses and mastoids clear. Orbital soft tissues unremarkable. IMPRESSION: No acute intracranial abnormality. Chronic small vessel disease, stable. Electronically Signed   By: Rolm Baptise M.D.   On: 09/16/2015 13:16    Scheduled Meds: . atorvastatin  20 mg Oral q1800  . cefTRIAXone (ROCEPHIN)  IV  1 g Intravenous Q24H  . cholecalciferol  2,000 Units Oral Daily  . docusate sodium  100 mg Oral BID  . enoxaparin (LOVENOX) injection  40 mg Subcutaneous Q24H  . Investigational - Study Medication  80 mg Oral QHS  . LamoTRIgine  200 mg Oral QHS  . meloxicam  15 mg Oral Daily  . modafinil  200 mg Oral Daily   Continuous Infusions: . sodium chloride 75 mL/hr at 09/16/15 1759    Principal Problem:   UTI (lower urinary tract infection) Active Problems:   Hypertension   Multiple sclerosis ([place])    Time spent: 35 minutes.     Niel Hummer A  [facility] Pager [phone]. If 7PM-7AM, please contact night-coverage at [email or web address], password [facility] 09/17/2015, 7:43 AM

## 2015-09-17 NOTE — Care Management Note (Signed)
Case Management Note  Patient Details  Name: Wanda [name] MRN: [id number] Date of Birth: 1951-06-19  Subjective/Objective:                 64 yo admitted with UTI, hx of MS   Action/Plan: From home  Expected Discharge Date:   (unknown)               Expected Discharge Plan:  [place]  In-House Referral:     Discharge planning Services  CM Consult  Post Acute Care Choice:  Home Health Choice offered to:  Patient  DME Arranged:    DME Agency:     HH Arranged:    [place] Agency:     Status of Service:  In process, will continue to follow  Medicare Important Message Given:    Date Medicare IM Given:    Medicare IM give by:    Date Additional Medicare IM Given:    Additional Medicare Important Message give by:     If discussed at [place] of Stay Meetings, dates discussed:    Additional Comments: PT is recommending HHPT at discharge. This CM met with pt at bedside to offer choice for [facility] services. Pt states she would like to look over home health provider list with her daughter prior to making a decision. CM will check back in with pt for choice. Lynnell Catalan, RN 09/17/2015, 1:51 PM

## 2015-09-17 NOTE — Evaluation (Addendum)
Physical Therapy Evaluation Patient Details Name: Wanda [name] MRN: [id number] DOB: 07-Apr-1951 Today's Date: 09/17/2015   History of Present Illness  64 y.o. female with h/o multiple sclerosis with R sided weakness admitted with increased weakness and difficulty walking. Dx of UTI.  Clinical Impression  Pt admitted with above diagnosis. Pt currently with functional limitations due to the deficits listed below (see PT Problem List). Pt ambulated 250' with RW with min A for balance, safety, and management of RW. She requires frequent verbal cues and re-direction for safe use of RW.  Pt will benefit from skilled PT to increase their independence and safety with mobility to allow discharge to the venue listed below.       Follow Up Recommendations Home health PT    Equipment Recommendations  None recommended by PT    Recommendations for Other Services       Precautions / Restrictions Precautions Precautions: Fall Precaution Comments: pt reports 3 falls in past year Restrictions Weight Bearing Restrictions: No      Mobility  Bed Mobility               General bed mobility comments: NT- up at edge of bed  Transfers Overall transfer level: Needs assistance Equipment used: Rolling walker (2 wheeled) Transfers: Sit to/from Stand Sit to Stand: Min guard         General transfer comment: min/guard for safety due to h/o falls and R sided weakness  Ambulation/Gait Ambulation/Gait assistance: Min assist Ambulation Distance (Feet): 250 Feet Assistive device: Rolling walker (2 wheeled) Gait Pattern/deviations: Step-to pattern;Decreased dorsiflexion - right;Wide base of support   Gait velocity interpretation: Below normal speed for age/gender General Gait Details: Min A for balance, safety, and positioning of RW; RLE externally rotated, pt steps outside frame of RW with RLE despite frequent verbal cues to step inside frame of RW, also frequent verbal cues to maintain RW  closer, pt able to follow instructions for 1-2 steps then returned to stepping outside of RW with RLE  Stairs            Wheelchair Mobility    Modified Rankin (Stroke Patients Only)       Balance Overall balance assessment: Needs assistance   Sitting balance-Leahy Scale: Good     Standing balance support: Bilateral upper extremity supported Standing balance-Leahy Scale: Poor                               Pertinent Vitals/Pain Pain Assessment: 0-10 Pain Score: 7  Pain Location: R calf Pain Descriptors / Indicators: Sore Pain Intervention(s): Monitored during session;Limited activity within patient's tolerance;Patient requesting pain meds-RN notified    Home Living Family/patient expects to be discharged to:: Private residence Living Arrangements: Children (daughter and son in law) Available Help at Discharge: Family;Available 24 hours/day Type of Home: House Home Access: Level entry     Home Layout: One level Home Equipment: Cane - single point;Walker - 2 wheels;Transport chair;Bedside commode      Prior Function Level of Independence: Needs assistance   Gait / Transfers Assistance Needed: walks with RW independently, daughter assists with getting RLE into bed as needed  ADL's / Homemaking Assistance Needed: daughter assists with bathing and dressing; uses BSC in shower        Hand Dominance   Dominant Hand: Right    Extremity/Trunk Assessment   Upper Extremity Assessment: RUE deficits/detail RUE Deficits / Details: 3/5 grip R hand,  can elevate shoulder to 120* AROM         Lower Extremity Assessment: RLE deficits/detail RLE Deficits / Details: hip flexion 2/5, knee extension +2/5, knee ext AROM -35*, ankle PF/DF +3/5    Cervical / Trunk Assessment: Normal  Communication   Communication: No difficulties  Cognition Arousal/Alertness: Awake/alert Behavior During Therapy: WFL for tasks assessed/performed Overall Cognitive Status:  No family/caregiver present to determine baseline cognitive functioning (able to answer questions but becomes easily distracted and requires re-direction, can follow directions with multiple, frequent verbal cues)                      General Comments      Exercises  Ankle Pumps x 10 B AROM seated      Assessment/Plan    PT Assessment Patient needs continued PT services  PT Diagnosis Abnormality of gait;Difficulty walking;Acute pain;Hemiplegia dominant side   PT Problem List Decreased strength;Decreased balance;Pain;Decreased safety awareness;Decreased mobility  PT Treatment Interventions DME instruction;Gait training;Functional mobility training;Therapeutic activities;Patient/family education;Therapeutic exercise;Balance training   PT Goals (Current goals can be found in the Care Plan section) Acute Rehab PT Goals Patient Stated Goal: to return to her home PT Goal Formulation: With patient Time For Goal Achievement: 10/01/15 Potential to Achieve Goals: Fair    Frequency Min 3X/week   Barriers to discharge   pt is staying with daughter but very much wants to return to her home where she lives alone but has a neighbor who can assist    Co-evaluation               End of Session Equipment Utilized During Treatment: Gait belt Activity Tolerance: Patient tolerated treatment well Patient left: in chair;with call bell/phone within reach;with chair alarm set Nurse Communication: Mobility status;Patient requests pain meds    Functional Assessment Tool Used: clinical judgment Functional Limitation: Mobility: Walking and moving around Mobility: Walking and Moving Around Current Status [phone]): At least 20 percent but less than 40 percent impaired, limited or restricted Mobility: Walking and Moving Around Goal Status [phone]): At least 1 percent but less than 20 percent impaired, limited or restricted    Time: 1105-1136 PT Time Calculation (min) (ACUTE ONLY): 31  min   Charges:   PT Evaluation $Initial PT Evaluation Tier I: 1 Procedure PT Treatments $Gait Training: 8-22 mins   PT G Codes:   PT G-Codes **NOT FOR INPATIENT CLASS** Functional Assessment Tool Used: clinical judgment Functional Limitation: Mobility: Walking and moving around Mobility: Walking and Moving Around Current Status [phone]): At least 20 percent but less than 40 percent impaired, limited or restricted Mobility: Walking and Moving Around Goal Status [phone]): At least 1 percent but less than 20 percent impaired, limited or restricted    Philomena Doheny 09/17/2015, 11:47 AM [phone]

## 2015-09-18 DIAGNOSIS — N390 Urinary tract infection, site not specified: Principal | ICD-10-CM

## 2015-09-18 DIAGNOSIS — G35 Multiple sclerosis: Secondary | ICD-10-CM

## 2015-09-18 LAB — URINE CULTURE

## 2015-09-18 LAB — AMMONIA: AMMONIA: 30 umol/L (ref 9–35)

## 2015-09-18 MED ORDER — CEFUROXIME AXETIL 500 MG PO TABS
500.0000 mg | ORAL_TABLET | Freq: Two times a day (BID) | ORAL | Status: DC
  Administered 2015-09-18: 500 mg via ORAL
  Filled 2015-09-18 (×2): qty 1

## 2015-09-18 MED ORDER — CEFUROXIME AXETIL 500 MG PO TABS: 500.0000 mg | ORAL_TABLET | Freq: Two times a day (BID) | ORAL | Status: DC

## 2015-09-18 MED ORDER — DOCUSATE SODIUM 100 MG PO CAPS: 100.0000 mg | ORAL_CAPSULE | Freq: Two times a day (BID) | ORAL | Status: DC | PRN

## 2015-09-18 NOTE — Progress Notes (Signed)
Discharge instructions given to pt, verbalized understanding. Left the unit in stable condition. 

## 2015-09-18 NOTE — Progress Notes (Signed)
This CM checked back in with pt for choice for HHPT. Pt chooses [place]. [facility] rep called to give referral. Will need MD order for HHPT. No other DC needs communicated. Marney Doctor RN,BSN,NCM [phone].

## 2015-09-18 NOTE — Progress Notes (Signed)
Physical Therapy Treatment Patient Details Name: Wanda [name] MRN: [id number] DOB: 08-21-1951 Today's Date: 09/18/2015    History of Present Illness 64 y.o. female with h/o multiple sclerosis with R sided weakness admitted with increased weakness and difficulty walking. Dx of UTI.    PT Comments    Pt ambulated in hallway and states she is doing better today compared to yesterday.  Pt c/o R calf pain however states this is chronic due to hx of MS and requested ace wrap (RN notified).  Follow Up Recommendations  Home health PT     Equipment Recommendations  None recommended by PT    Recommendations for Other Services       Precautions / Restrictions Precautions Precautions: Fall Precaution Comments: pt reports 3 falls in past year    Mobility  Bed Mobility Overal bed mobility: Modified Independent             General bed mobility comments: increased time and effort due to self assisting R LE, HOB was not elevated  Transfers Overall transfer level: Needs assistance Equipment used: Rolling walker (2 wheeled) Transfers: Sit to/from Stand Sit to Stand: Min assist         General transfer comment: assist to steady upon standing due to LOB, required 2 attempts to rise, wide BOS observed  Ambulation/Gait Ambulation/Gait assistance: Min assist Ambulation Distance (Feet): 120 Feet Assistive device: Rolling walker (2 wheeled) Gait Pattern/deviations: Step-to pattern;Decreased dorsiflexion - right;Wide base of support     General Gait Details: assist for balance initially however improved with distance, verbal cues for RW positioning   Stairs            Wheelchair Mobility    Modified Rankin (Stroke Patients Only)       Balance                                    Cognition Arousal/Alertness: Awake/alert Behavior During Therapy: WFL for tasks assessed/performed Overall Cognitive Status: Within Functional Limits for tasks assessed  (however easily distracted, slightly impulsive)                      Exercises      General Comments        Pertinent Vitals/Pain Pain Assessment: 0-10 Pain Score: 6  Pain Location: R calf Pain Descriptors / Indicators: Sore Pain Intervention(s): Limited activity within patient's tolerance;Monitored during session (states this is chronic, she uses ace wrap at home, RN notified)    Home Living                      Prior Function            PT Goals (current goals can now be found in the care plan section) Progress towards PT goals: Progressing toward goals    Frequency  Min 3X/week    PT Plan Current plan remains appropriate    Co-evaluation             End of Session   Activity Tolerance: Patient tolerated treatment well Patient left: in chair;with call bell/phone within reach;with chair alarm set     Time: [id number] PT Time Calculation (min) (ACUTE ONLY): 16 min  Charges:  $Gait Training: 8-22 mins                    G Codes:      Pinchos Topel,KATHrine  E 09/18/2015, 12:15 PM Carmelia Bake, PT, DPT 09/18/2015 Pager: [phone]

## 2015-09-18 NOTE — Progress Notes (Signed)
Nutrition Brief Note  Patient identified on the Malnutrition Screening Tool (MST) Report  Wt Readings from Last 15 Encounters:  08/27/15 123 lb (55.792 kg)  08/06/15 127 lb (57.607 kg)  08/05/15 122 lb (55.339 kg)  07/29/15 123 lb 3.8 oz (55.9 kg)  07/15/15 123 lb 0.6 oz (55.809 kg)  06/26/15 119 lb (53.978 kg)  03/20/15 127 lb (57.607 kg)  03/13/15 130 lb (58.968 kg)  12/25/14 124 lb (56.246 kg)  12/05/14 124 lb (56.246 kg)  11/27/14 123 lb (55.792 kg)  10/30/14 123 lb (55.792 kg)  10/02/14 127 lb (57.607 kg)  08/10/14 124 lb (56.246 kg)  07/18/14 124 lb (56.246 kg)    There is no weight on file to calculate BMI. Patient meets criteria for normal weight based on current BMI.   Current diet order is low sodium, heart healthy, patient is consuming approximately 100% of meals at this time. Labs and medications reviewed.   No nutrition interventions warranted at this time. If nutrition issues arise, please consult RD.   Satira Anis. Aairah Negrette, MS, RD LDN After Hours/Weekend Pager [phone]

## 2015-09-20 NOTE — Discharge Summary (Signed)
[facility] Discharge Summary   Patient: Wanda [name]    [id number] PCP: JEFFERY,[name], PA-C    DOB: 1951-02-03 Date of admission: 09/16/2015  Date of discharge: 09/18/2015   Discharge Diagnoses:  Principal Problem:   UTI (lower urinary tract infection) Active Problems:   Hypertension   Multiple sclerosis ([place])   Recommendations for Outpatient Follow-up:  1.  follow up with PCP as well as neurology as needed. 2. home health with [facility].   Diet recommendation: regular diet  Activity: The patient is advised to gradually reintroduce usual activities. With home health  Discharge Condition: good  History of present illness: As per the H and P dictated on admission, "Wanda [name] is a 64 y.o. female with past medical history significant for hypertension, MS, at baseline patient is able to you walk with a walker, she has chronic right-sided weakness. She presents today with worsening generalized weakness. She has not been able to ambulate since 2 days prior to admission. She denies dysuria, does relate increased urinary frequency. Denies fever or chills, no chest pain no shortness of breath no abdominal pain. She is on natalizumab for MS. ED physician discussed with neurology, generalized weakness is considered to be secondary to UTI and less likely to MS flare, due to waxing and waning symptoms."  Hospital Course:  Summary of her active problems in the hospital is as following. Principal Problem:   UTI (lower urinary tract infection) Active Problems:   Multiple sclerosis ([place])   1) generalized weakness  Uncomplicated cystitis. presented with c/o difficulty ambulating. Work up in ER was suggesting UTI. She was started on ceftriaxone and urine culture were positive for E COLI sensitive to ceftin and she was d/c home on same to finish 7 day course.  2) multiple sclerosis Her symptoms improved rapidly with treatment of UTI. Felt less likely due to MS flare up. She will  continue home medication and regular follow up with neurology for her TYSABRI injection.  All other chronic medical condition were stable during the hospitalization.  Patient was seen by physical therapy, who recommended home health, which was arranged by Education officer, museum and case Freight forwarder. On the day of the discharge the patient's ambulation improved, and no other acute medical condition were reported by patient. the patient was felt safe to be discharge at home with [facility].  Procedures and Results:  none   Consultations:  Phone consultation by neurology in ER  Discharge Exam: There were no vitals filed for this visit. Filed Vitals:   09/18/15 0543 09/18/15 1435  BP: 148/78 114/95  Pulse: 63 72  Temp: 98.2 F (36.8 C) 98.7 F (37.1 C)  Resp: 18 18    General: Appear in no distress, no Rash; Oral Mucosa moist. Cardiovascular: S1 and S2 Present, no Murmur, no JVD Respiratory: Bilateral Air entry present and Clear to Auscultation, no Crackles, no wheezes Abdomen: Bowel Sound present, Soft and non tenderness Extremities: no Pedal edema, no calf tenderness Neurology: Grossly no focal neuro deficit.  DISCHARGE MEDICATION: Discharge Instructions    Diet - low sodium heart healthy    Complete by:  As directed      Increase activity slowly    Complete by:  As directed           Discharge Medication List as of 09/18/2015  3:10 PM    START taking these medications   Details  docusate sodium (COLACE) 100 MG capsule Take 1 capsule (100 mg total) by mouth 2 (two) times  daily as needed for mild constipation., Starting 09/18/2015, Until Discontinued, Print      CONTINUE these medications which have CHANGED   Details  cefUROXime (CEFTIN) 500 MG tablet Take 1 tablet (500 mg total) by mouth 2 (two) times daily with a meal., Starting 09/18/2015, Until Discontinued, Print      CONTINUE these medications which have NOT CHANGED   Details  atorvastatin (LIPITOR) 20 MG tablet take 1  tablet by mouth once daily, Normal    Cholecalciferol (VITAMIN D3) 2000 UNITS capsule Take 2,000 Units by mouth daily., Until Discontinued, Historical Med    ibuprofen (ADVIL,MOTRIN) 200 MG tablet Take 800 mg by mouth 3 (three) times daily as needed for headache., Until Discontinued, Historical Med    Investigational - Study Medication Take 80 mg by mouth at bedtime. Additional Study Details:Patient is on a study drug with NEURO- Baclofen ER 40 MG, takes 80 mg at night, Until Discontinued, Historical Med    LamoTRIgine (LAMICTAL XR) 100 MG TB24 2 tabs po qhs, Normal    meloxicam (MOBIC) 15 MG tablet take 1 tablet by mouth once daily, Normal    modafinil (PROVIGIL) 200 MG tablet Take 1 tablet (200 mg total) by mouth daily., Starting 02/26/2015, Until Discontinued, Print    natalizumab (TYSABRI) 300 MG/15ML injection Inject 300 mg into the vein every 30 (thirty) days. 2nd of each month, Until Discontinued, Historical Med    Omega-3 Fatty Acids (FISH OIL PO) Take 1 tablet by mouth daily., Until Discontinued, Historical Med    OVER THE COUNTER MEDICATION Take 1 capsule by mouth 2 (two) times daily. Neuro Optimizer vitamins, Until Discontinued, Historical Med    VITAMIN E PO Take 1 tablet by mouth daily., Until Discontinued, Historical Med    zolpidem (AMBIEN) 5 MG tablet Take 1 tablet (5 mg total) by mouth at bedtime as needed for sleep., Starting 09/04/2015, Until Discontinued, Print       No Known Allergies Follow-up Information    Follow up with [facility].   Specialty:  Home Health Services   Contact information:   [address] [place] [address] [phone]       Follow up with JEFFERY,[name], PA-C In 1 week.   Specialty:  Family Medicine   Contact information:   [place] [id number] [phone]       Follow up with Marcial Pacas, MD. Call in 3 days.   Specialty:  Neurology   Why:  for follow up for medication    Contact  information:   [place] [address] [phone]       The results of significant diagnostics from this hospitalization (including imaging, microbiology, ancillary and laboratory) are listed below for reference.    Significant Diagnostic Studies: Dg Chest 2 View  09/16/2015  CLINICAL DATA:  Confusion and altered mental status. EXAM: CHEST  2 VIEW COMPARISON:  07/29/2015 and 03/12/2015 FINDINGS: Prominent linear density at the left lung base is suggestive for some atelectasis. Otherwise, the lungs are clear. Stable appearance of the heart and mediastinum. The thoracic aorta appears tortuous and similar to the prior examinations. No pleural effusions. Mild degenerative changes in the thoracic spine. IMPRESSION: Probable left basilar atelectasis. Otherwise, no acute chest findings. Electronically Signed   By: Markus Daft M.D.   On: 09/16/2015 12:57   Ct Head Wo Contrast  09/16/2015  CLINICAL DATA:  Confusion, altered mental status. EXAM: CT HEAD WITHOUT CONTRAST TECHNIQUE: Contiguous axial images were  obtained from the base of the skull through the vertex without intravenous contrast. COMPARISON:  07/29/2015 FINDINGS: Mild chronic small vessel disease changes throughout the deep white matter. No acute intracranial abnormality. Specifically, no hemorrhage, hydrocephalus, mass lesion, acute infarction, or significant intracranial injury. No acute calvarial abnormality. Visualized paranasal sinuses and mastoids clear. Orbital soft tissues unremarkable. IMPRESSION: No acute intracranial abnormality. Chronic small vessel disease, stable. Electronically Signed   By: Rolm Baptise M.D.   On: 09/16/2015 13:16    Microbiology: Recent Results (from the past 240 hour(s))  Urine culture     Status: None   Collection Time: 09/16/15  1:30 PM  Result Value Ref Range Status   Specimen Description URINE, CLEAN CATCH  Final   Special Requests NONE  Final   Culture   Final    >=100,000  COLONIES/mL ESCHERICHIA COLI Performed at [facility]    Report Status 09/18/2015 FINAL  Final   Organism ID, Bacteria ESCHERICHIA COLI  Final      Susceptibility   Escherichia coli - MIC*    AMPICILLIN >=32 RESISTANT Resistant     CEFAZOLIN <=4 SENSITIVE Sensitive     CEFTRIAXONE <=1 SENSITIVE Sensitive     CIPROFLOXACIN <=0.25 SENSITIVE Sensitive     GENTAMICIN <=1 SENSITIVE Sensitive     IMIPENEM <=0.25 SENSITIVE Sensitive     NITROFURANTOIN <=16 SENSITIVE Sensitive     TRIMETH/SULFA >=320 RESISTANT Resistant     AMPICILLIN/SULBACTAM 16 INTERMEDIATE Intermediate     PIP/TAZO <=4 SENSITIVE Sensitive     * >=100,000 COLONIES/mL ESCHERICHIA COLI     Labs: CBC:  Recent Labs Lab 09/16/15 1213 09/16/15 1247 09/17/15 0547  WBC 5.4  --  4.7  NEUTROABS 3.2  --   --   HGB 12.1 13.3 11.2*  HCT 37.1 39.0 34.4*  MCV 86.9  --  84.5  PLT 196  --  0000000   Basic Metabolic Panel:  Recent Labs Lab 09/16/15 1213 09/16/15 1247 09/17/15 0547  NA 141 144 139  K 4.8 4.1 3.5  CL 110 108 111  CO2 26  --  21*  GLUCOSE 99 95 96  BUN 18 18 15   CREATININE 1.09* 0.90 1.00  CALCIUM 9.1  --  8.8*   Liver Function Tests:  Recent Labs Lab 09/16/15 1213  AST 27  ALT 19  ALKPHOS 76  BILITOT 1.0  PROT 7.2  ALBUMIN 3.6   No results for input(s): LIPASE, AMYLASE in the last 168 hours.  Recent Labs Lab 09/16/15 1213 09/18/15 0530  AMMONIA 48* 30   CBG:  Recent Labs Lab 09/16/15 1229  GLUCAP 90    Time spent: 30 minutes  Signed:  Dayn Barich  [facility] 09/18/2015, 1:59 PM

## 2015-09-23 ENCOUNTER — Telehealth: Payer: Self-pay | Admitting: Neurology

## 2015-09-23 NOTE — Telephone Encounter (Signed)
Patient is calling and states that she is bed ridden now.  She recently went to the ER and can no longer use her hands and legs.  Please call.

## 2015-09-23 NOTE — Telephone Encounter (Signed)
Tried calling pt back. LVM for her to call back. Gave [facility] phone number and hours.

## 2015-09-23 NOTE — Telephone Encounter (Signed)
Pt's daughter called sts she has recently been taken off clonzepam and put on ambien by Dr Krista Blue. She was hospitalized on 09/16/15 for 3 days, she was dx with UTI and elevated levels of ammonia. Since being home daughter is attempting to get her care. Daughter sts pt is taking ambien at night trying to walk at night but has fallen 3 times, she is lethargic during the day-she is confused also. She feels like the Lorrin Mais is too strong. Daughter said last night and night before she did not put ambien in pill box and pt said "oh you gave me my sleep med, I'm so tired". Daughter thinks she does not need it, it is all in her mind that she needs a sleeping pill. Please call and advise Pt's daughter advised a [facility] DPR needs to be signed-[facility] DPR signed 07/15/15 giving auth to speak with daughter

## 2015-09-23 NOTE — Telephone Encounter (Signed)
Please advise patient that if she has a sudden change in her neurological symptoms or her consciousness level, she has to go to the emergency room, she may have to call 911 if she does not have a family member to take her.

## 2015-09-23 NOTE — Telephone Encounter (Signed)
Called daughter back. Advised she is not listed on any DPR form. We do not have one on file. She will need to have pt sign copy. I offered for them to pick one up in office to sign. She declined. She would like me to mail to pt and they will fax back. She did not want to pick copy up in office.

## 2015-09-23 NOTE — Telephone Encounter (Signed)
Patient called back, forgot she had called already.

## 2015-09-23 NOTE — Telephone Encounter (Signed)
Left message on vm with information below. I stressed the fact that she should be seen in ED if she is having symptoms below.

## 2015-09-24 NOTE — Telephone Encounter (Signed)
Spoke to [employer] on file) - she is able to use hands and legs - she does have limited mobility - her daughter is going to hold her Ambien (see other phone note) to see if it is a contributing factor - she will proceed to ER, as directed below, for any concerning neurological changes.

## 2015-09-24 NOTE — Telephone Encounter (Signed)
Spoke to [employer] POA scanned to chart on 09/17/15) - she is now aware that Ambien is not required every night but just prn when she is having problems sleeping.  She was not aware and will just keep it on hand for use when needed.  I told her she can also try half the dose the next time she gives it to her.  She expressed understanding and will call back with any further concerns.

## 2015-09-25 ENCOUNTER — Encounter (HOSPITAL_COMMUNITY): Payer: Self-pay | Admitting: Emergency Medicine

## 2015-09-25 ENCOUNTER — Emergency Department (HOSPITAL_COMMUNITY)
Admission: EM | Admit: 2015-09-25 | Discharge: 2015-09-25 | Disposition: A | Discharge status: Home / Self Care | Payer: Federal, State, Local not specified - PPO | Attending: Emergency Medicine | Admitting: Emergency Medicine

## 2015-09-25 DIAGNOSIS — I10 Essential (primary) hypertension: Secondary | ICD-10-CM | POA: Diagnosis not present

## 2015-09-25 DIAGNOSIS — F329 Major depressive disorder, single episode, unspecified: Secondary | ICD-10-CM | POA: Diagnosis not present

## 2015-09-25 DIAGNOSIS — E7800 Pure hypercholesterolemia, unspecified: Secondary | ICD-10-CM | POA: Insufficient documentation

## 2015-09-25 DIAGNOSIS — M1990 Unspecified osteoarthritis, unspecified site: Secondary | ICD-10-CM | POA: Diagnosis not present

## 2015-09-25 DIAGNOSIS — R200 Anesthesia of skin: Secondary | ICD-10-CM | POA: Diagnosis present

## 2015-09-25 DIAGNOSIS — Z792 Long term (current) use of antibiotics: Secondary | ICD-10-CM | POA: Diagnosis not present

## 2015-09-25 DIAGNOSIS — Z87891 Personal history of nicotine dependence: Secondary | ICD-10-CM | POA: Diagnosis not present

## 2015-09-25 DIAGNOSIS — Z791 Long term (current) use of non-steroidal anti-inflammatories (NSAID): Secondary | ICD-10-CM | POA: Insufficient documentation

## 2015-09-25 DIAGNOSIS — Z8742 Personal history of other diseases of the female genital tract: Secondary | ICD-10-CM | POA: Diagnosis not present

## 2015-09-25 DIAGNOSIS — R531 Weakness: Secondary | ICD-10-CM | POA: Insufficient documentation

## 2015-09-25 DIAGNOSIS — G35 Multiple sclerosis: Secondary | ICD-10-CM | POA: Diagnosis not present

## 2015-09-25 DIAGNOSIS — F419 Anxiety disorder, unspecified: Secondary | ICD-10-CM | POA: Diagnosis not present

## 2015-09-25 DIAGNOSIS — Z87440 Personal history of urinary (tract) infections: Secondary | ICD-10-CM | POA: Insufficient documentation

## 2015-09-25 DIAGNOSIS — Z79899 Other long term (current) drug therapy: Secondary | ICD-10-CM | POA: Diagnosis not present

## 2015-09-25 LAB — BASIC METABOLIC PANEL
Anion gap: 10 (ref 5–15)
BUN: 15 mg/dL (ref 6–20)
CALCIUM: 9.7 mg/dL (ref 8.9–10.3)
CO2: 25 mmol/L (ref 22–32)
CREATININE: 1.08 mg/dL — AB (ref 0.44–1.00)
Chloride: 109 mmol/L (ref 101–111)
GFR calc non Af Amer: 53 mL/min — ABNORMAL LOW (ref >60)
GLUCOSE: 96 mg/dL (ref 65–99)
Potassium: 3.7 mmol/L (ref 3.5–5.1)
Sodium: 144 mmol/L (ref 135–145)

## 2015-09-25 LAB — CBC
HEMATOCRIT: 39.6 % (ref 36.0–46.0)
Hemoglobin: 13 g/dL (ref 12.0–15.0)
MCH: 27.7 pg (ref 26.0–34.0)
MCHC: 32.8 g/dL (ref 30.0–36.0)
MCV: 84.3 fL (ref 78.0–100.0)
Platelets: 193 10*3/uL (ref 150–400)
RBC: 4.7 MIL/uL (ref 3.87–5.11)
RDW: 15.6 % — AB (ref 11.5–15.5)
WBC: 4.6 10*3/uL (ref 4.0–10.5)

## 2015-09-25 NOTE — ED Notes (Signed)
Pt transported via EMS from home for c/o numbness on R side. HX of MS, chronic paralysis to R leg, weakness to R arm. Pt states these episodes usually resolve this one has not. A & O.

## 2015-09-25 NOTE — ED Notes (Signed)
Pt states she was unable to sit up this morning in her home, pt states this has been getting progressively worse. R side leg paralysis and significant weakness to R arm noted, pt states this is at baseline. Pt demonstrated with Dr. Venora Maples how she gets herself out of bed at home and ambulates with a walker. Pt does have cell phone with her and she is able to communicate with family. Pt states daughter insisted she come to the ED.

## 2015-09-25 NOTE — ED Provider Notes (Signed)
CSN: [id number]     Arrival date & time 09/25/15  1936 History   First MD Initiated Contact with Patient 09/25/15 1948     Chief Complaint  Patient presents with  . Numbness     HPI Patient has a history of multiple sclerosis.  She ambulates with a walker.  She has chronic weakness of her right lower extremity.  She presents the emergency department today because she felt truncal weakness today and had difficulty getting out of bed.  Her family insisted that she come to the ER for evaluation.  She's had mild flares like this before.  She denies chest pain shortness breath.  No new weakness of her arms or legs.  She now reports that she is feeling better and she menstruated how she is able to get out of the bed now reports this is back to baseline.  She has no fevers or chills.  No increasing back or neck pain.   Past Medical History  Diagnosis Date  . Elevated cholesterol   . Hypertension   . Arthritis   . Cervical dysplasia   . Depression   . Anxiety   . Macular degeneration of right eye 2001  . MS (multiple sclerosis) ([place]) 08/2013  . Acute encephalopathy 03/12/2015   Past Surgical History  Procedure Laterality Date  . Colposcopy    . Knee surgery Bilateral     "had cortisone injections in my knees"  . Cholecystectomy    . Tubal ligation    . Tonsillectomy    . Colonoscopy w/ polypectomy     Family History  Problem Relation Age of Onset  . Cancer Mother     Colon  . Diabetes Mother   . Hypertension Mother   . Arthritis Mother   . Cancer Father     prostate  . Kidney disease Brother     congenital single kidney  . Arthritis Sister   . Arthritis Sister   . Hematuria Son   . Gout Brother   . Multiple sclerosis Brother   . Arthritis Brother   . HIV Brother   . Cancer Brother     spinal   Social History  Substance Use Topics  . Smoking status: Former Smoker -- 1.00 packs/day for 45 years    Types: Cigarettes    Quit date: 10/23/2013  . Smokeless tobacco: Never  Used     Comment: Quit smoking in 2014.  Marland Kitchen Alcohol Use: 0.0 oz/week    0 Standard drinks or equivalent per week     Comment: hasn't drank anything since jan 2015   OB History    Gravida Para Term Preterm AB TAB SAB Ectopic Multiple Living   6 3 3  3  3   2      Review of Systems  All other systems reviewed and are negative.     Allergies  Review of patient's allergies indicates no known allergies.  Home Medications   Prior to Admission medications   Medication Sig Start Date End Date Taking? Authorizing Provider  atorvastatin (LIPITOR) 20 MG tablet take 1 tablet by mouth once daily 05/28/14   [name] Jeffery, PA-C  cefUROXime (CEFTIN) 500 MG tablet Take 1 tablet (500 mg total) by mouth 2 (two) times daily with a meal. 09/18/15   Lavina Hamman, MD  Cholecalciferol (VITAMIN D3) 2000 UNITS capsule Take 2,000 Units by mouth daily.    Historical Provider, MD  docusate sodium (COLACE) 100 MG capsule Take 1 capsule (100 mg  total) by mouth 2 (two) times daily as needed for mild constipation. 09/18/15   Lavina Hamman, MD  ibuprofen (ADVIL,MOTRIN) 200 MG tablet Take 800 mg by mouth 3 (three) times daily as needed for headache.    Historical Provider, MD  Investigational - Study Medication Take 80 mg by mouth at bedtime. Additional Study Details:Patient is on a study drug with NEURO- Baclofen ER 40 MG, takes 80 mg at night    Historical Provider, MD  LamoTRIgine (LAMICTAL XR) 100 MG TB24 2 tabs po qhs 08/05/15   Marcial Pacas, MD  meloxicam (MOBIC) 15 MG tablet take 1 tablet by mouth once daily 06/12/15   Marcial Pacas, MD  modafinil (PROVIGIL) 200 MG tablet Take 1 tablet (200 mg total) by mouth daily. 02/26/15   Marcial Pacas, MD  natalizumab (TYSABRI) 300 MG/15ML injection Inject 300 mg into the vein every 30 (thirty) days. 2nd of each month    Historical Provider, MD  Omega-3 Fatty Acids (FISH OIL PO) Take 1 tablet by mouth daily.    Historical Provider, MD  OVER THE COUNTER MEDICATION Take 1 capsule by  mouth 2 (two) times daily. Neuro Optimizer vitamins    Historical Provider, MD  VITAMIN E PO Take 1 tablet by mouth daily.    Historical Provider, MD  zolpidem (AMBIEN) 5 MG tablet Take 1 tablet (5 mg total) by mouth at bedtime as needed for sleep. 09/04/15   Marcial Pacas, MD   BP 139/91 mmHg  Pulse 61  Temp(Src) 98.2 F (36.8 C) (Oral)  Resp 18  Ht 5' (1.524 m)  Wt 127 lb (57.607 kg)  BMI 24.80 kg/m2  SpO2 100% Physical Exam  Constitutional: She is oriented to person, place, and time. She appears well-developed and well-nourished. No distress.  HENT:  Head: Normocephalic and atraumatic.  Eyes: EOM are normal.  Neck: Normal range of motion.  Cardiovascular: Normal rate, regular rhythm and normal heart sounds.   Pulmonary/Chest: Effort normal and breath sounds normal.  Abdominal: Soft. She exhibits no distension. There is no tenderness.  Musculoskeletal: Normal range of motion.  Neurological: She is alert and oriented to person, place, and time.  Right leg weakness as compared to left (Baseline for patient).  Able to ambulate in the room with assistance  Skin: Skin is warm and dry.  Psychiatric: She has a normal mood and affect. Judgment normal.  Nursing note and vitals reviewed.   ED Course  Procedures (including critical care time) Labs Review Labs Reviewed - No data to display  Imaging Review No results found. I have personally reviewed and evaluated these images and lab results as part of my medical decision-making.   EKG Interpretation None      MDM   Final diagnoses:  None    Patient reports she feels back to normal this time.  She has a ride available to her and she would like to be discharged home at this time.  She does not want to wait for blood or urine.  Previously she had a urinary tract infection which led to a mild MS flare.  I've asked the patient follow-up with her neurologist to return to the ER for new or worsening symptoms or if she wants to continue  the workup that was initiated here.    Jola Schmidt, MD 09/25/15 2129

## 2015-09-25 NOTE — ED Notes (Signed)
Called to the room by patient, patient sitting on the edge of bed and states she needs to leave now, her neighbor is here to get her and she does not want to wait for test results.

## 2015-09-28 ENCOUNTER — Other Ambulatory Visit: Payer: Self-pay | Admitting: Physician Assistant

## 2015-09-28 ENCOUNTER — Other Ambulatory Visit: Payer: Self-pay | Admitting: Neurology

## 2015-10-01 ENCOUNTER — Other Ambulatory Visit: Payer: Self-pay

## 2015-10-01 MED ORDER — MODAFINIL 200 MG PO TABS: 200.0000 mg | ORAL_TABLET | Freq: Every day | ORAL | Status: DC

## 2015-10-01 MED ORDER — ZOLPIDEM TARTRATE 5 MG PO TABS: 5.0000 mg | ORAL_TABLET | Freq: Every evening | ORAL | Status: DC | PRN

## 2015-10-02 ENCOUNTER — Ambulatory Visit (INDEPENDENT_AMBULATORY_CARE_PROVIDER_SITE_OTHER): Payer: Federal, State, Local not specified - PPO | Admitting: Neurology

## 2015-10-02 ENCOUNTER — Emergency Department (HOSPITAL_COMMUNITY)
Admission: EM | Admit: 2015-10-02 | Discharge: 2015-10-02 | Disposition: A | Discharge status: Home / Self Care | Payer: Federal, State, Local not specified - PPO | Source: Home / Self Care | Attending: Physician Assistant | Admitting: Physician Assistant

## 2015-10-02 ENCOUNTER — Telehealth: Payer: Self-pay | Admitting: Neurology

## 2015-10-02 ENCOUNTER — Encounter (HOSPITAL_COMMUNITY): Payer: Self-pay

## 2015-10-02 DIAGNOSIS — F419 Anxiety disorder, unspecified: Secondary | ICD-10-CM | POA: Insufficient documentation

## 2015-10-02 DIAGNOSIS — F329 Major depressive disorder, single episode, unspecified: Secondary | ICD-10-CM | POA: Insufficient documentation

## 2015-10-02 DIAGNOSIS — Z792 Long term (current) use of antibiotics: Secondary | ICD-10-CM | POA: Insufficient documentation

## 2015-10-02 DIAGNOSIS — Z8742 Personal history of other diseases of the female genital tract: Secondary | ICD-10-CM

## 2015-10-02 DIAGNOSIS — I10 Essential (primary) hypertension: Secondary | ICD-10-CM | POA: Insufficient documentation

## 2015-10-02 DIAGNOSIS — Z79899 Other long term (current) drug therapy: Secondary | ICD-10-CM | POA: Insufficient documentation

## 2015-10-02 DIAGNOSIS — F05 Delirium due to known physiological condition: Secondary | ICD-10-CM

## 2015-10-02 DIAGNOSIS — Z791 Long term (current) use of non-steroidal anti-inflammatories (NSAID): Secondary | ICD-10-CM

## 2015-10-02 DIAGNOSIS — E7800 Pure hypercholesterolemia, unspecified: Secondary | ICD-10-CM | POA: Insufficient documentation

## 2015-10-02 DIAGNOSIS — M1990 Unspecified osteoarthritis, unspecified site: Secondary | ICD-10-CM

## 2015-10-02 DIAGNOSIS — Z87891 Personal history of nicotine dependence: Secondary | ICD-10-CM

## 2015-10-02 DIAGNOSIS — R402413 Glasgow coma scale score 13-15, at hospital admission: Secondary | ICD-10-CM

## 2015-10-02 DIAGNOSIS — G35 Multiple sclerosis: Secondary | ICD-10-CM | POA: Diagnosis not present

## 2015-10-02 LAB — CBC WITH DIFFERENTIAL/PLATELET
BASOS PCT: 1 %
Basophils Absolute: 0 10*3/uL (ref 0.0–0.1)
EOS ABS: 0.2 10*3/uL (ref 0.0–0.7)
EOS PCT: 4 %
HCT: 33.8 % — ABNORMAL LOW (ref 36.0–46.0)
Hemoglobin: 10.9 g/dL — ABNORMAL LOW (ref 12.0–15.0)
Lymphocytes Relative: 42 %
Lymphs Abs: 2.1 10*3/uL (ref 0.7–4.0)
MCH: 27 pg (ref 26.0–34.0)
MCHC: 32.2 g/dL (ref 30.0–36.0)
MCV: 83.9 fL (ref 78.0–100.0)
MONO ABS: 0.3 10*3/uL (ref 0.1–1.0)
MONOS PCT: 6 %
NEUTROS PCT: 47 %
Neutro Abs: 2.4 10*3/uL (ref 1.7–7.7)
Platelets: 150 10*3/uL (ref 150–400)
RBC: 4.03 MIL/uL (ref 3.87–5.11)
RDW: 15.8 % — AB (ref 11.5–15.5)
WBC: 5 10*3/uL (ref 4.0–10.5)

## 2015-10-02 LAB — COMPREHENSIVE METABOLIC PANEL
ALK PHOS: 73 U/L (ref 38–126)
ALT: 16 U/L (ref 14–54)
AST: 16 U/L (ref 15–41)
Albumin: 3.4 g/dL — ABNORMAL LOW (ref 3.5–5.0)
Anion gap: 9 (ref 5–15)
BUN: 12 mg/dL (ref 6–20)
CALCIUM: 9.1 mg/dL (ref 8.9–10.3)
CO2: 23 mmol/L (ref 22–32)
CREATININE: 0.99 mg/dL (ref 0.44–1.00)
Chloride: 115 mmol/L — ABNORMAL HIGH (ref 101–111)
GFR calc non Af Amer: 59 mL/min — ABNORMAL LOW (ref >60)
GLUCOSE: 152 mg/dL — AB (ref 65–99)
Potassium: 3.1 mmol/L — ABNORMAL LOW (ref 3.5–5.1)
SODIUM: 147 mmol/L — AB (ref 135–145)
Total Bilirubin: 0.4 mg/dL (ref 0.3–1.2)
Total Protein: 6.4 g/dL — ABNORMAL LOW (ref 6.5–8.1)

## 2015-10-02 LAB — URINE MICROSCOPIC-ADD ON

## 2015-10-02 LAB — URINALYSIS, ROUTINE W REFLEX MICROSCOPIC
BILIRUBIN URINE: NEGATIVE
Glucose, UA: NEGATIVE mg/dL
KETONES UR: NEGATIVE mg/dL
Leukocytes, UA: NEGATIVE
NITRITE: NEGATIVE
Protein, ur: NEGATIVE mg/dL
Specific Gravity, Urine: 1.022 (ref 1.005–1.030)
pH: 5.5 (ref 5.0–8.0)

## 2015-10-02 LAB — AMMONIA: AMMONIA: 24 umol/L (ref 9–35)

## 2015-10-02 LAB — RAPID URINE DRUG SCREEN, HOSP PERFORMED
AMPHETAMINES: NOT DETECTED
BARBITURATES: NOT DETECTED
Benzodiazepines: POSITIVE — AB
Cocaine: NOT DETECTED
Opiates: NOT DETECTED
TETRAHYDROCANNABINOL: NOT DETECTED

## 2015-10-02 LAB — CK: CK TOTAL: 96 U/L (ref 38–234)

## 2015-10-02 MED ORDER — SODIUM CHLORIDE 0.9 % IV BOLUS (SEPSIS): 1000.0000 mL | Freq: Once | INTRAVENOUS | Status: DC

## 2015-10-02 NOTE — Telephone Encounter (Deleted)
No chief complaint on file.      PATIENT: Wanda [name] DOB: March 09, 1951  HISTORICAL   INITIAL VISIT In FEB 2015  Wanda [name] is accompanied by her daughter, Wanda [name], she was initially evaluated by Dr Rexene Alberts in December 2nd 2014, referred by her primary care Dr.Jeffrey  Ms. Marks is a 64 years old right-handed African American female, with past medical history of hypertension, hyperlipidemia, longtime smoker  She presents with acute onset of slurred speech, vertigo, since November 7th 2014, also has gait difficulty,  CAT scan of the brain without contrast in November 2014 showed moderate for age nonspecific cerebral white matter changes  MRI showed multiple scattered , discrete and confluent lesions at periventricular, periatrial, peri-callosal brainstem, cranial medullary junction and upper cervical spine white matter hyperintensities on T2/flair.  Enhancing lesions in the left paramedian pons. Left posterior frontal and left parietal and tiny right frontal periventricular white matter which likely represent active demarcating plaques. multiple T1 black holes noted in the periventricular and subcortical white matter. There is mild general cerebral atrophy.  MRI scan of the cervical spine showed spinal cord hyperintensities at C2 and C5 likely represent demyelinating plaques. Contrast images show actively enhancing lesion at C2.  Laboratory evaluation showed normal or negative NMO antibody, A1c 5. 9, ANA, CPK, F79, folic acid, vitamin B1, ESR, TSH, RPR, CMP, , CBC,  CSF study show oligoclonal banding four,, normal meyelin basic protein. ACE was normal 8,   WBC 0, RBC 6, glucose 59, total protein 43,   JC virus titer was positive, 0.66, repeat titer 0.60 in 08/10/2014. 0.58 Sep 2016  She moved to [place] around 2010, In 2001, she had one episode of sudden right visual loss, reported extensive evaluation at that time, was diagnosed with right "eye problem"have received po steroid,  intraocular steroid injection, per patient MRI of the brain was done at that time too.  Her right vision never came back to normal, it was blurry, she can only read large print with the right eye  Before this episode, she was highly functional, she retired as a Software engineer, lives independently, no gait difficulty, exercise regularly, driving,   After discuss with patient, and her daughter, she has active form of RRMS, we decided to proceed with Dwyane Dee IV infusion started since January 29 2014,  she understands the potential risks, including PML infection, but with her low JC virus  titer, she has a less than 0.10/998 chance of getting PML in the first 24 months, we are monitoring her JC virus antibody titer every 3 months, repeating MRI of brain every to 6 months.  She also agrees to enroll in baclofen extended release trial, because she continues to have bilateral upper and lower extremity muscle spasticity.  UPDATE Oct 2nd 2015:  She was enrolled in [employer] baclofen extended release study since May 24 2014,, tolerating the medication well, on last visit in September 24th 2015, her baclofen dosage was increased from 20 mg, to 30 milligrams daily, she tolerated the medication very well, reported continued improvement, less  bilaterally hands muscle spasm, less bilateral lower extremity muscle spasm, she is able to squeeze them and again, she fell a few days ago, has right lateral thigh area proves, achy pain,  UPDATE Nov 6th 2015: A week before her most recent  Tysarbri infusion in October seventh 2015, she felt right arm, and leg weakness, increased gait difficulty, fatigue, getting worse since October seventh 2015, which has been persistent, she  had quit driving about 2 weeks ago, no confident in her right leg controlling the gas paddles.  We have reviewed repeat MRI of the brain together,there was evidence of Moderate-severe periventricular and subcortical and pontine and upper  cervical cord chronic demyelinating disease. Mild diffuse and severe corpus callosum atrophy.  No abnormal lesions are seen on post contrast views. Compared to MRI on 09/21/13 and 10/11/13, there is a new chronic plaque (96m) in the left basal ganglia (series 17, image 13). Also prior acute plaques no longer enhance.  This new lesions involving left basal ganglion, likely explaining for her acute worsening of right side weakness, could indicating a MS flareup,  UPDATE Nov 18th 2015:  She is here for some formal follow-up according to protocol, currently taking baclofen ER 40 mg every day without significant side effect, she was treated with Metro pack since last visit in August 10 2014, reported improvement of her right side weakness, but still not back to her baseline, could not drive, because of lack of control of right ankle movement  She also complains of excessive fatigue,  Anti-Tysarbri Antibody was negative in Nov 2015  UPDATE Feb 24th 2016: She started taking provigil 100 mg twice a day, which has been helpful, she is tolerating baclofen ER 40 mg every day, which does help her right upper and lower extremity spasticity, she continue have gait difficulty, no longer driving, low titer JC virus positive antibody, 0.6, negative Tysarbri antibody, she did has 1 flareups of right-sided weakness in October 2015, 6 months after taking Tysarbri April 2015,  She continue complains of right upper lower extremity muscle spasticity, will increase her baclofen ER to 50 mg daily  UPDATE March 8th 2016: Higher dose of baclofen ER 50 mg every night, works better, she has less right arm and right leg spasticity, clonazepam 1 mg as needed works well for her insomnia, she hopes to be on higher dose of baclofen, for better control of her right side spasticity.  UPDATE March 20 2015: She was admitted to hospital in March 22 2015, she has developed constipation few days prior, take over-the-counter laxatives,  had watery diarrhea in March 11 2015, early morning March 12 2015, while getting up using bathroom, she complains of lightheadedness, slumped over, transient loss of consciousness, mild confusion afterwards her daughter called ambulance, was noted to be hypotensive upon presentation, Blood pressure 88/54, pulse 58, temperature 94.5 F (34.7 C), resp. rate 13, weight 56.7 kg (125 lb), SpO2 100 %. With mild elevated BUN/creatinine, I have reviewed the repeat MRI of the brain in March 13 2015, stable MS lesions,no acute enhancing lesions.  I reviewed laboratory, normal CBC, BMP showed low potassium 3.4, mild elevated creatinine 1.08  UPDATE Aug 05 2015: She was found by her daughter in the bathroom, disoriented, sweaty, slumped over in July 30 2015, she complains weakness all over, disoriented, but no loss of consciousness, there was patchy memory of the event, she remembers she was awakened by the dog, wanted to use the bathroom, Could not remember rest of the event afterwards,  The confusion lasted for a few more hours  She was taken to the emergency room,Had extensive evaluations, I have personally reviewed MRI of the brain with and without contrast October 2016:No acute intracranial abnormality. Chronic demyelinating disease appears stable since June with no areas of acute demyelination identified. Small, unusual hemorrhagic subependymal cyst / lesion adjacent to the left atrium is unchanged since June.  EEG was normal,laboratory evaluation showed normal  TSH, CBC showed mild anemia 10 point 8, normal CMP, UA showed trace leukocytosis  This episode is very similar to her previous hospital admission in June 2016, Sudden onset acute confusion generalized weakness,  Review of system: system review of systems performed and notable only for  gait difficulty, right side weakness  ALLERGIES: No Known Allergies  HOME MEDICATIONS: Outpatient Prescriptions Prior to Visit  Medication Sig Dispense Refill  .  atorvastatin (LIPITOR) 20 MG tablet Take 1 tablet (20 mg total) by mouth daily.  90 tablet  3  . estradiol (ESTRACE) 0.5 MG tablet Take 1 tablet (0.5 mg total) by mouth daily.  90 tablet  3  . glucosamine-chondroitin 500-400 MG tablet Take 1 tablet by mouth 2 (two) times daily with a meal.      . hydrochlorothiazide (HYDRODIURIL) 25 MG tablet take 1 tablet by mouth once daily  90 tablet  1  . medroxyPROGESTERone (PROVERA) 2.5 MG tablet Take 1 tablet (2.5 mg total) by mouth daily.  90 tablet  4  . meloxicam (MOBIC) 15 MG tablet take 1 tablet by mouth once daily  90 tablet  1  . traZODone (DESYREL) 50 MG tablet TAKE 1/2 TO 1 TABLET AT BEDTIME AS NEEDED FOR SLEEP.  30 tablet  2  . varenicline (CHANTIX) 1 MG tablet Take 1 tablet (1 mg total) by mouth 2 (two) times daily.  60 tablet  5  . zolpidem (AMBIEN) 10 MG tablet take 1 tablet by mouth at bedtime if needed  30 tablet  0     PAST MEDICAL HISTORY: Past Medical History  Diagnosis Date  . Elevated cholesterol   . Hypertension   . Arthritis   . Cervical dysplasia   . Depression   . Anxiety   . Macular degeneration of right eye 2001    PAST SURGICAL HISTORY: Past Surgical History  Procedure Laterality Date  . Colposcopy    . Knee surgery    . Cholecystectomy    . Tubal ligation      FAMILY HISTORY: Family History  Problem Relation Age of Onset  . Cancer Mother     Colon  . Diabetes Mother   . Hypertension Mother   . Arthritis Mother   . Cancer Father     prostate  . Kidney disease Brother     congenital single kidney  . Arthritis Sister   . Arthritis Sister   . Hematuria Son   . Gout Brother   . Multiple sclerosis Brother   . Arthritis Brother   . HIV Brother   . Cancer Brother     spinal    SOCIAL HISTORY:  History   Social History  . Marital Status: Widowed    Spouse Name: N/A    Number of Children: 2  . Years of Education: college   Occupational History  . retired     Social History Main Topics  .  Smoking status: Current Some Day Smoker -- 1.00 packs/day for 45 years    Types: Cigarettes    Last Attempt to Quit: 01/21/2013  . Smokeless tobacco: Never Used  . Alcohol Use: 3.6 oz/week    6 Cans of beer per week  . Drug Use: No  . Sexual Activity: No   Other Topics Concern  . Not on file   Social History Narrative   [place] up in [place] area, 1 of 10 siblings - 53 still living, married for 33 years then divorced, moved to [place] to be near daughter  post retirement, Deforest Hoyles has 1 son. 2 dogs.       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     PHYSICAL EXAM   PHYSICAL EXAMNIATION:  Gen: NAD, conversant, well nourised, obese, well groomed                     Cardiovascular: Regular rate rhythm, no peripheral edema, warm, nontender. Eyes: Conjunctivae clear without exudates or hemorrhage Neck: Supple, no carotid bruise. Pulmonary: Clear to auscultation bilaterally   NEUROLOGICAL EXAM:  MENTAL STATUS: Speech:    Speech is normal; fluent and spontaneous with normal comprehension.  Cognition:    The patient is oriented to person, place, and time;     recent and remote memory intact;     language fluent;     normal attention, concentration,     fund of knowledge.  CRANIAL NERVES: CN II: Visual fields are full to confrontation. Fundoscopic exam is normal with sharp discs and no vascular changes. Pupils are 4 mm and briskly reactive to light.  CN III, IV, VI: extraocular movement are normal. No ptosis. CN V: Facial sensation is intact to pinprick in all 3 divisions bilaterally. Corneal responses are intact.  CN VII: Face is symmetric with normal eye closure and smile. CN VIII: Hearing is normal to rubbing fingers CN IX, X: Palate elevates symmetrically. Phonation is normal. CN XI: Head turning and shoulder shrug are intact CN XII: Tongue is midline with normal movements and no atrophy.  MOTOR: There is no pronator drift of out-stretched arms. Muscle  bulk and tone are normal. Muscle strength is normal.   Shoulder abduction Shoulder external rotation Elbow flexion Elbow extension Wrist flexion Wrist extension Finger abduction Hip flexion Knee flexion Knee extension Ankle dorsi flexion Ankle plantar flexion  R 4 4 4- 4 4 4 4 3 4 4  4- 4+  L 5 5 5 5 5 5 5 5 5 5 5 5     REFLEXES: Reflexes are 2+ and symmetric at the biceps, triceps, knees, and ankles. Plantar responses are flexor.  SENSORY: Light touch, pinprick, position sense, and vibration sense are intact in fingers and toes.  COORDINATION: Rapid alternating movements and fine finger movements are intact. There is no dysmetria on finger-to-nose and heel-knee-shin. There are no abnormal or extraneous movements.   GAIT/STANCE: Unsteady, right hemi-circumferential gait, dragging her right leg, Romberg is absent.  DIAGNOSTIC DATA (LABS, IMAGING, TESTING) - I reviewed patient records, labs, notes, testing and imaging myself where available.   ASSESSMENT AND PLAN   64 years old African American female  Relapsing remitting multiple sclerosis,   presented with previous optic neuritis, gait difficulty since 2014, worsening right-sided weakness since September 2015, while taking Tysarbri IV infusion,  Repeat  MRI of the brain showed new lesion at left basal ganglia/internal capsule region, in the setting of extensive lesions involving brain, and cervical.   She has been treated with Dwyane Dee infusion since January 29 2014, tolerating medication well, She is JC virus positive, with titer of  0.6 in November 2015,  FATIGUE  Keep provigil 100 mg twice a day Insomnia  Clonazepam 1 mg as needed   New onset recurrent acute Confusion,possible complex partial seizure  Starting lamotrigine xr, titrating to 100 mg, 2 tablets every night    Marcial Pacas, M.D. Ph.D.  [facility] [address] [place], [place] [address] [phone]

## 2015-10-02 NOTE — ED Notes (Signed)
Lab phlebotomist unable to obtain lab work. Another lab phlebotomist to come at 1600.

## 2015-10-02 NOTE — ED Notes (Signed)
Attempted to draw blood from pt, however no success.

## 2015-10-02 NOTE — Telephone Encounter (Signed)
I have contacted her daughter, she came seen for IV Tysarbri infusion, presented with confusion, difficulty to be aroused, after discussion, we decided to send her to paramedic to emergency room for evaluation

## 2015-10-02 NOTE — Telephone Encounter (Signed)
Dr. Thomasene Lot from [facility] ER called to discuss pt. Please call [phone]

## 2015-10-02 NOTE — ED Notes (Signed)
EDP notified that Iv Team unable to find a vein for blood or IV start.

## 2015-10-02 NOTE — ED Notes (Signed)
Lab at bedside

## 2015-10-02 NOTE — ED Notes (Signed)
Patient refuses to be stuck again for labs or IV start.

## 2015-10-02 NOTE — ED Notes (Signed)
Bed: [facility] Expected date:  Expected time:  Means of arrival:  Comments: EMS- 59s F, AMS

## 2015-10-02 NOTE — ED Provider Notes (Signed)
CSN: [id number]     Arrival date & time 10/02/15  1319 History   First MD Initiated Contact with Patient 10/02/15 1519     Chief Complaint  Patient presents with  . Fatigue  . decreased mobility   . Altered Mental Status     (Consider location/radiation/quality/duration/timing/severity/associated sxs/prior Treatment) HPI   Patient is a 64 year old female with history of multiple sclerosis. She ambulates with a walker at home. She has chronic weakness on her right side. She is presenting today from [facility] neurology clinic. Reportedly she felt a little bit weaker than usual this morning but felt at baseline after getting out of bed.  Pt went to neurology clinic to her feet receiving infusion. They sent her here because "they thought it was more lethargic than usual". Patient is awake alert on exam here. There is no confusion, she is able to give a linear story about today's events. She does not appear more sleepy than usual.  She says that her weakness that she had earlier today feels improved.    She had recent admission for UTI. An additional visit for weakness last week. Patient states that after her last hospitalization she went to go live with her daughter. However that was not a good living situation. She reports that she's planning to move to be with her son and live in an assisted living in [place] soon.    Past Medical History  Diagnosis Date  . Elevated cholesterol   . Hypertension   . Arthritis   . Cervical dysplasia   . Depression   . Anxiety   . Macular degeneration of right eye 2001  . MS (multiple sclerosis) ([place]) 08/2013  . Acute encephalopathy 03/12/2015   Past Surgical History  Procedure Laterality Date  . Colposcopy    . Knee surgery Bilateral     "had cortisone injections in my knees"  . Cholecystectomy    . Tubal ligation    . Tonsillectomy    . Colonoscopy w/ polypectomy     Family History  Problem Relation Age of Onset  . Cancer Mother     Colon  .  Diabetes Mother   . Hypertension Mother   . Arthritis Mother   . Cancer Father     prostate  . Kidney disease Brother     congenital single kidney  . Arthritis Sister   . Arthritis Sister   . Hematuria Son   . Gout Brother   . Multiple sclerosis Brother   . Arthritis Brother   . HIV Brother   . Cancer Brother     spinal   Social History  Substance Use Topics  . Smoking status: Former Smoker -- 1.00 packs/day for 45 years    Types: Cigarettes    Quit date: 10/23/2013  . Smokeless tobacco: Never Used     Comment: Quit smoking in 2014.  Marland Kitchen Alcohol Use: 0.0 oz/week    0 Standard drinks or equivalent per week     Comment: hasn't drank anything since jan 2015   OB History    Gravida Para Term Preterm AB TAB SAB Ectopic Multiple Living   6 3 3  3  3   2      Review of Systems  Constitutional: Negative for fever, activity change and fatigue.  HENT: Negative for congestion.   Respiratory: Negative for shortness of breath.   Cardiovascular: Negative for chest pain.  Gastrointestinal: Negative for abdominal pain.  Genitourinary: Negative for difficulty urinating.  Neurological: Positive for  dizziness and weakness. Negative for seizures and syncope.  Psychiatric/Behavioral: Negative for confusion and agitation.      Allergies  Review of patient's allergies indicates no known allergies.  Home Medications   Prior to Admission medications   Medication Sig Start Date End Date Taking? Authorizing Provider  atorvastatin (LIPITOR) 20 MG tablet take 1 tablet by mouth once daily 05/28/14  Yes [name] Jeffery, PA-C  meloxicam (MOBIC) 15 MG tablet take 1 tablet by mouth once daily 06/12/15  Yes Marcial Pacas, MD  traZODone (DESYREL) 50 MG tablet TAKE 1/2 TO 1 TABLET BY MOUTH AT BEDTIME IF NEEDED FOR SLEEP 10/01/15  Yes [name] Jeffery, PA-C  cefUROXime (CEFTIN) 500 MG tablet Take 1 tablet (500 mg total) by mouth 2 (two) times daily with a meal. 09/18/15   Lavina Hamman, MD  Cholecalciferol  (VITAMIN D3) 2000 UNITS capsule Take 2,000 Units by mouth daily.    Historical Provider, MD  docusate sodium (COLACE) 100 MG capsule Take 1 capsule (100 mg total) by mouth 2 (two) times daily as needed for mild constipation. 09/18/15   Lavina Hamman, MD  ibuprofen (ADVIL,MOTRIN) 200 MG tablet Take 800 mg by mouth 3 (three) times daily as needed for headache.    Historical Provider, MD  Investigational - Study Medication Take 80 mg by mouth at bedtime. Additional Study Details:Patient is on a study drug with NEURO- Baclofen ER 40 MG, takes 80 mg at night    Historical Provider, MD  LamoTRIgine (LAMICTAL XR) 100 MG TB24 2 tabs po qhs 08/05/15   Marcial Pacas, MD  modafinil (PROVIGIL) 200 MG tablet Take 1 tablet (200 mg total) by mouth daily. 10/01/15   Marcial Pacas, MD  natalizumab (TYSABRI) 300 MG/15ML injection Inject 300 mg into the vein every 30 (thirty) days. 2nd of each month    Historical Provider, MD  OVER THE COUNTER MEDICATION Take 1 capsule by mouth 2 (two) times daily. Neuro Optimizer vitamins    Historical Provider, MD  zolpidem (AMBIEN) 5 MG tablet Take 1 tablet (5 mg total) by mouth at bedtime as needed for sleep. 10/01/15   Marcial Pacas, MD   BP 138/76 mmHg  Pulse 60  Temp(Src) 98.2 F (36.8 C) (Oral)  Resp 17  SpO2 100% Physical Exam  Constitutional: She is oriented to person, place, and time. She appears well-developed and well-nourished.  HENT:  Head: Normocephalic and atraumatic.  Eyes: Conjunctivae are normal. Right eye exhibits no discharge.  Neck: Neck supple.  Cardiovascular: Normal rate, regular rhythm and normal heart sounds.   No murmur heard. Pulmonary/Chest: Effort normal and breath sounds normal. She has no wheezes. She has no rales.  Abdominal: Soft. She exhibits no distension. There is no tenderness.  Musculoskeletal: Normal range of motion. She exhibits no edema.  Neurological: She is oriented to person, place, and time. No cranial nerve deficit.  Chronic right-sided  weakness. Left side 5 out of 5 strength in upper and lower extremities  Skin: Skin is warm and dry. No rash noted. She is not diaphoretic.  Psychiatric: She has a normal mood and affect.  Nursing note and vitals reviewed.   ED Course  Procedures (including critical care time) Labs Review Labs Reviewed  COMPREHENSIVE METABOLIC PANEL  CBC WITH DIFFERENTIAL/PLATELET  URINALYSIS, ROUTINE W REFLEX MICROSCOPIC (NOT AT [facility])  CK  AMMONIA    Imaging Review No results found. I have personally reviewed and evaluated these images and lab results as part of my medical decision-making.   EKG  Interpretation None      MDM   Final diagnoses:  None   patient is a 64 year old female with history of multiple sclerosis and chronic right sided weakness. She use a walker at home. She is here because she was seen at the fNeurology office today and thought to be lethargic. She displays no lethargy on my exam. She is alert and oriented 3. She is no weakness on physical exam her left side is 5 out of 5 strength in bilateral upper and lower extremities. She has nothing focal about her exam. Her lungs are clear she got no abdominal tenderness. We will check for urinary tract infection and a pneumonia given the complaint of lethargy earlier today.  Most importantly  we'll touch base with [facility] neuro since they saw her today and had an exam that appears to be different from our exam.   4:32 PM Called [facility] neuro at 3:30. Awaited response. Called back now. To clairfy what she would like done today. Patient has normal physical exam here.   Talked to DR. Krista Blue.  She wanted a UDS because patient was so altered at her appointemtn.  She appears to have metabolized whatever was causing her kethargy. Dr. Krista Blue is not concerned otherwise.    7:30 UDS positive for benzos. Pt reports she sometimes takes these and maybe that is what happened this morning.  Patient ambulatory with walker, taking PO, no confusion.     87:45 patient's duaghter came, requested lab work. Fem stick done, patient tolerated well. Labs sent.  9:30 PM Labs show mild hypernatremia.  Told patient to increase water intake, repeat labs at neurologic infusion session next week. Pt and duaghter would like to go home.       Leroi Haque Julio Alm, MD 10/02/15 2131

## 2015-10-02 NOTE — ED Notes (Signed)
I WAS UNABLE TO COLLECT LABS AT THIS TIME I DID NOT SEE ANY WHERE TO COLLECT FROM.  I MADE THE NURSE AWARE.

## 2015-10-02 NOTE — ED Notes (Signed)
Pt cannot use restroom at this time, aware specimen is needed. 

## 2015-10-02 NOTE — ED Notes (Addendum)
Per EMS- Patient was at [facility] neuro and EMS was called because patient was lethargic, legs feeling heavy, and had decreased mobility which was worse than normal. Patient's neighbor told EMS that they found the patient this AM layhing in the floor by her bed. Patient states she was tired and laid down. Patient has residual weakness to the right side which patient states is her norm. Patient has a history of MS.

## 2015-10-02 NOTE — ED Notes (Signed)
Lab Phlebotomy called for a lab draw. EDP Notified.

## 2015-10-04 ENCOUNTER — Inpatient Hospital Stay (HOSPITAL_COMMUNITY)
Admission: EM | Admit: 2015-10-04 | Discharge: 2015-10-08 | DRG: 689 | Disposition: A | Discharge status: Skilled Nursing Facility | Payer: Federal, State, Local not specified - PPO | Source: Ambulatory Visit | Attending: Internal Medicine | Admitting: Internal Medicine

## 2015-10-04 ENCOUNTER — Encounter (HOSPITAL_COMMUNITY): Payer: Self-pay | Admitting: *Deleted

## 2015-10-04 ENCOUNTER — Emergency Department (HOSPITAL_COMMUNITY): Payer: Federal, State, Local not specified - PPO

## 2015-10-04 DIAGNOSIS — M1990 Unspecified osteoarthritis, unspecified site: Secondary | ICD-10-CM | POA: Diagnosis present

## 2015-10-04 DIAGNOSIS — R4182 Altered mental status, unspecified: Secondary | ICD-10-CM | POA: Diagnosis present

## 2015-10-04 DIAGNOSIS — F329 Major depressive disorder, single episode, unspecified: Secondary | ICD-10-CM | POA: Diagnosis present

## 2015-10-04 DIAGNOSIS — G9340 Encephalopathy, unspecified: Secondary | ICD-10-CM

## 2015-10-04 DIAGNOSIS — Z87891 Personal history of nicotine dependence: Secondary | ICD-10-CM | POA: Diagnosis not present

## 2015-10-04 DIAGNOSIS — E876 Hypokalemia: Secondary | ICD-10-CM | POA: Diagnosis not present

## 2015-10-04 DIAGNOSIS — H109 Unspecified conjunctivitis: Secondary | ICD-10-CM | POA: Diagnosis present

## 2015-10-04 DIAGNOSIS — N390 Urinary tract infection, site not specified: Secondary | ICD-10-CM | POA: Diagnosis not present

## 2015-10-04 DIAGNOSIS — R531 Weakness: Secondary | ICD-10-CM | POA: Diagnosis present

## 2015-10-04 DIAGNOSIS — F339 Major depressive disorder, recurrent, unspecified: Secondary | ICD-10-CM | POA: Diagnosis not present

## 2015-10-04 DIAGNOSIS — E870 Hyperosmolality and hypernatremia: Secondary | ICD-10-CM | POA: Diagnosis present

## 2015-10-04 DIAGNOSIS — H5712 Ocular pain, left eye: Secondary | ICD-10-CM

## 2015-10-04 DIAGNOSIS — G35 Multiple sclerosis: Secondary | ICD-10-CM | POA: Diagnosis not present

## 2015-10-04 DIAGNOSIS — H3530 Unspecified macular degeneration: Secondary | ICD-10-CM | POA: Diagnosis present

## 2015-10-04 DIAGNOSIS — I10 Essential (primary) hypertension: Secondary | ICD-10-CM | POA: Diagnosis present

## 2015-10-04 DIAGNOSIS — M6289 Other specified disorders of muscle: Secondary | ICD-10-CM | POA: Diagnosis not present

## 2015-10-04 DIAGNOSIS — B300 Keratoconjunctivitis due to adenovirus: Secondary | ICD-10-CM | POA: Diagnosis present

## 2015-10-04 DIAGNOSIS — F419 Anxiety disorder, unspecified: Secondary | ICD-10-CM | POA: Diagnosis present

## 2015-10-04 DIAGNOSIS — F250 Schizoaffective disorder, bipolar type: Secondary | ICD-10-CM | POA: Diagnosis not present

## 2015-10-04 DIAGNOSIS — E7800 Pure hypercholesterolemia, unspecified: Secondary | ICD-10-CM | POA: Diagnosis present

## 2015-10-04 DIAGNOSIS — R488 Other symbolic dysfunctions: Secondary | ICD-10-CM | POA: Diagnosis not present

## 2015-10-04 DIAGNOSIS — M6281 Muscle weakness (generalized): Secondary | ICD-10-CM | POA: Diagnosis not present

## 2015-10-04 DIAGNOSIS — G9349 Other encephalopathy: Secondary | ICD-10-CM | POA: Diagnosis present

## 2015-10-04 DIAGNOSIS — R404 Transient alteration of awareness: Secondary | ICD-10-CM | POA: Diagnosis present

## 2015-10-04 DIAGNOSIS — H269 Unspecified cataract: Secondary | ICD-10-CM | POA: Insufficient documentation

## 2015-10-04 LAB — CBC
HCT: 33.7 % — ABNORMAL LOW (ref 36.0–46.0)
HEMATOCRIT: 36.6 % (ref 36.0–46.0)
HEMOGLOBIN: 11.8 g/dL — AB (ref 12.0–15.0)
Hemoglobin: 10.9 g/dL — ABNORMAL LOW (ref 12.0–15.0)
MCH: 27.3 pg (ref 26.0–34.0)
MCH: 27.8 pg (ref 26.0–34.0)
MCHC: 32.2 g/dL (ref 30.0–36.0)
MCHC: 32.3 g/dL (ref 30.0–36.0)
MCV: 84.7 fL (ref 78.0–100.0)
MCV: 86 fL (ref 78.0–100.0)
PLATELETS: 156 10*3/uL (ref 150–400)
Platelets: 167 10*3/uL (ref 150–400)
RBC: 4.32 MIL/uL (ref 3.87–5.11)
RBC: 3.92 MIL/uL (ref 3.87–5.11)
RDW: 16.2 % — ABNORMAL HIGH (ref 11.5–15.5)
RDW: 16.6 % — AB (ref 11.5–15.5)
WBC: 5.3 10*3/uL (ref 4.0–10.5)
WBC: 6.8 10*3/uL (ref 4.0–10.5)

## 2015-10-04 LAB — I-STAT CHEM 8, ED
BUN: 10 mg/dL (ref 6–20)
CALCIUM ION: 1.25 mmol/L (ref 1.13–1.30)
CREATININE: 1 mg/dL (ref 0.44–1.00)
Chloride: 113 mmol/L — ABNORMAL HIGH (ref 101–111)
Glucose, Bld: 154 mg/dL — ABNORMAL HIGH (ref 65–99)
HEMATOCRIT: 38 % (ref 36.0–46.0)
HEMOGLOBIN: 12.9 g/dL (ref 12.0–15.0)
POTASSIUM: 3.2 mmol/L — AB (ref 3.5–5.1)
Sodium: 149 mmol/L — ABNORMAL HIGH (ref 135–145)
TCO2: 21 mmol/L (ref 0–100)

## 2015-10-04 LAB — DIFFERENTIAL
BASOS ABS: 0 10*3/uL (ref 0.0–0.1)
Basophils Relative: 1 %
EOS ABS: 0.2 10*3/uL (ref 0.0–0.7)
Eosinophils Relative: 4 %
LYMPHS ABS: 3.3 10*3/uL (ref 0.7–4.0)
LYMPHS PCT: 61 %
MONOS PCT: 5 %
Monocytes Absolute: 0.2 10*3/uL (ref 0.1–1.0)
NEUTROS ABS: 1.5 10*3/uL — AB (ref 1.7–7.7)
NEUTROS PCT: 29 %

## 2015-10-04 LAB — URINALYSIS, ROUTINE W REFLEX MICROSCOPIC
BILIRUBIN URINE: NEGATIVE
GLUCOSE, UA: NEGATIVE mg/dL
HGB URINE DIPSTICK: NEGATIVE
Ketones, ur: NEGATIVE mg/dL
Nitrite: NEGATIVE
Protein, ur: NEGATIVE mg/dL
SPECIFIC GRAVITY, URINE: 1.022 (ref 1.005–1.030)
pH: 5.5 (ref 5.0–8.0)

## 2015-10-04 LAB — URINE MICROSCOPIC-ADD ON: RBC / HPF: NONE SEEN RBC/hpf (ref 0–5)

## 2015-10-04 LAB — LACTIC ACID, PLASMA: LACTIC ACID, VENOUS: 1.9 mmol/L (ref 0.5–2.0)

## 2015-10-04 LAB — CBG MONITORING, ED: Glucose-Capillary: 148 mg/dL — ABNORMAL HIGH (ref 65–99)

## 2015-10-04 LAB — COMPREHENSIVE METABOLIC PANEL
ALBUMIN: 3.3 g/dL — AB (ref 3.5–5.0)
ALT: 15 U/L (ref 14–54)
AST: 14 U/L — AB (ref 15–41)
Alkaline Phosphatase: 73 U/L (ref 38–126)
Anion gap: 10 (ref 5–15)
BILIRUBIN TOTAL: 0.4 mg/dL (ref 0.3–1.2)
BUN: 10 mg/dL (ref 6–20)
CO2: 20 mmol/L — ABNORMAL LOW (ref 22–32)
CREATININE: 1.07 mg/dL — AB (ref 0.44–1.00)
Calcium: 9 mg/dL (ref 8.9–10.3)
Chloride: 115 mmol/L — ABNORMAL HIGH (ref 101–111)
GFR calc Af Amer: >60 mL/min (ref >60)
GFR, EST NON AFRICAN AMERICAN: 54 mL/min — AB (ref >60)
GLUCOSE: 161 mg/dL — AB (ref 65–99)
POTASSIUM: 3.2 mmol/L — AB (ref 3.5–5.1)
Sodium: 145 mmol/L (ref 135–145)
TOTAL PROTEIN: 5.8 g/dL — AB (ref 6.5–8.1)

## 2015-10-04 LAB — CREATININE, SERUM
Creatinine, Ser: 0.84 mg/dL (ref 0.44–1.00)
GFR calc Af Amer: >60 mL/min (ref >60)
GFR calc non Af Amer: >60 mL/min (ref >60)

## 2015-10-04 LAB — RAPID URINE DRUG SCREEN, HOSP PERFORMED
AMPHETAMINES: NOT DETECTED
Barbiturates: NOT DETECTED
Benzodiazepines: POSITIVE — AB
Cocaine: NOT DETECTED
Opiates: NOT DETECTED
TETRAHYDROCANNABINOL: NOT DETECTED

## 2015-10-04 LAB — PROTIME-INR
INR: 1.08 (ref 0.00–1.49)
Prothrombin Time: 14.2 seconds (ref 11.6–15.2)

## 2015-10-04 LAB — TROPONIN I
Troponin I: <0.03 ng/mL (ref <0.031)
Troponin I: <0.03 ng/mL (ref <0.031)

## 2015-10-04 LAB — I-STAT TROPONIN, ED: Troponin i, poc: 0 ng/mL (ref 0.00–0.08)

## 2015-10-04 LAB — APTT: APTT: 24 s (ref 24–37)

## 2015-10-04 LAB — TSH: TSH: 0.456 u[IU]/mL (ref 0.350–4.500)

## 2015-10-04 MED ORDER — ONDANSETRON HCL 4 MG/2ML IJ SOLN: 4.0000 mg | Freq: Four times a day (QID) | INTRAMUSCULAR | Status: DC | PRN

## 2015-10-04 MED ORDER — DEXTROSE 5 % IV SOLN
2.0000 g | Freq: Once | INTRAVENOUS | Status: AC
  Administered 2015-10-04: 2 g via INTRAVENOUS
  Filled 2015-10-04: qty 2

## 2015-10-04 MED ORDER — DEXTROSE 5 % IV SOLN
1.0000 g | INTRAVENOUS | Status: DC
  Administered 2015-10-04 – 2015-10-05 (×2): 1 g via INTRAVENOUS
  Filled 2015-10-04 (×4): qty 10

## 2015-10-04 MED ORDER — POTASSIUM CHLORIDE 10 MEQ/100ML IV SOLN
10.0000 meq | INTRAVENOUS | Status: AC
  Administered 2015-10-04: 10 meq via INTRAVENOUS
  Filled 2015-10-04: qty 100

## 2015-10-04 MED ORDER — SODIUM CHLORIDE 0.9 % IV SOLN
INTRAVENOUS | Status: DC
  Administered 2015-10-04 – 2015-10-08 (×5): via INTRAVENOUS

## 2015-10-04 MED ORDER — LORAZEPAM 2 MG/ML IJ SOLN
2.0000 mg | Freq: Once | INTRAMUSCULAR | Status: AC
  Administered 2015-10-04: 2 mg via INTRAVENOUS

## 2015-10-04 MED ORDER — ENOXAPARIN SODIUM 40 MG/0.4ML ~~LOC~~ SOLN
40.0000 mg | SUBCUTANEOUS | Status: DC
  Administered 2015-10-04 – 2015-10-07 (×3): 40 mg via SUBCUTANEOUS
  Filled 2015-10-04 (×3): qty 0.4

## 2015-10-04 MED ORDER — ONDANSETRON HCL 4 MG/2ML IJ SOLN
4.0000 mg | Freq: Once | INTRAMUSCULAR | Status: DC
  Filled 2015-10-04: qty 2

## 2015-10-04 MED ORDER — LAMOTRIGINE 100 MG PO TABS
100.0000 mg | ORAL_TABLET | Freq: Two times a day (BID) | ORAL | Status: DC
  Administered 2015-10-05 – 2015-10-08 (×6): 100 mg via ORAL
  Filled 2015-10-04: qty 4
  Filled 2015-10-04: qty 1
  Filled 2015-10-04 (×2): qty 4
  Filled 2015-10-04 (×2): qty 1
  Filled 2015-10-04 (×3): qty 4
  Filled 2015-10-04 (×2): qty 1

## 2015-10-04 MED ORDER — POTASSIUM CHLORIDE 10 MEQ/100ML IV SOLN
10.0000 meq | INTRAVENOUS | Status: AC
  Administered 2015-10-04 (×2): 10 meq via INTRAVENOUS
  Filled 2015-10-04 (×2): qty 100

## 2015-10-04 MED ORDER — LORAZEPAM 2 MG/ML IJ SOLN
2.0000 mg | Freq: Once | INTRAMUSCULAR | Status: AC
  Administered 2015-10-04: 2 mg via INTRAVENOUS
  Filled 2015-10-04: qty 1

## 2015-10-04 MED ORDER — ONDANSETRON HCL 4 MG/2ML IJ SOLN
4.0000 mg | Freq: Once | INTRAMUSCULAR | Status: AC
  Administered 2015-10-04: 4 mg via INTRAVENOUS
  Filled 2015-10-04: qty 2

## 2015-10-04 MED ORDER — SODIUM CHLORIDE 0.9 % IV SOLN
INTRAVENOUS | Status: DC
Start: 2015-10-04 — End: 2015-10-05

## 2015-10-04 MED ORDER — SODIUM CHLORIDE 0.9 % IV BOLUS (SEPSIS)
500.0000 mL | Freq: Once | INTRAVENOUS | Status: AC
  Administered 2015-10-04: 500 mL via INTRAVENOUS

## 2015-10-04 MED ORDER — SODIUM CHLORIDE 0.9 % IV SOLN
INTRAVENOUS | Status: DC
  Administered 2015-10-04: 125 mL/h via INTRAVENOUS

## 2015-10-04 MED ORDER — ONDANSETRON HCL 4 MG/2ML IJ SOLN: 4.0000 mg | Freq: Three times a day (TID) | INTRAMUSCULAR | Status: AC | PRN

## 2015-10-04 MED ORDER — LORAZEPAM 2 MG/ML IJ SOLN
INTRAMUSCULAR | Status: AC
  Filled 2015-10-04: qty 1

## 2015-10-04 MED ORDER — LAMOTRIGINE ER 100 MG PO TB24: 200.0000 mg | ORAL_TABLET | Freq: Every day | ORAL | Status: DC

## 2015-10-04 MED ORDER — ONDANSETRON HCL 4 MG PO TABS: 4.0000 mg | ORAL_TABLET | Freq: Four times a day (QID) | ORAL | Status: DC | PRN

## 2015-10-04 NOTE — H&P (Signed)
PCP:   JEFFERY,[name], PA-C   Chief Complaint:  Altered mental status  HPI: 64 year old female who   has a past medical history of Elevated cholesterol; Hypertension; Arthritis; Cervical dysplasia; Depression; Anxiety; Macular degeneration of right eye (2001); MS (multiple sclerosis) ([place]) (08/2013); and Acute encephalopathy (03/12/2015). Today was brought to the hospital by EMS for altered mental status. Patient is nonverbal at this time, no family at bedside. History obtained from the ED notes. Patient's daughter found patient in bed vomiting not moving her right arm and right leg as much as the left and so she called EMS. Patient at baseline alert and oriented 4.  No other history obtainable. In the ED patient found to have abnormal UA, CT head was negative. She was seen by neurology and MRI brain was ordered. Also patient found to have hypokalemia and hypernatremia.  Allergies:  No Known Allergies    Past Medical History  Diagnosis Date  . Elevated cholesterol   . Hypertension   . Arthritis   . Cervical dysplasia   . Depression   . Anxiety   . Macular degeneration of right eye 2001  . MS (multiple sclerosis) ([place]) 08/2013  . Acute encephalopathy 03/12/2015    Past Surgical History  Procedure Laterality Date  . Colposcopy    . Knee surgery Bilateral     "had cortisone injections in my knees"  . Cholecystectomy    . Tubal ligation    . Tonsillectomy    . Colonoscopy w/ polypectomy      Prior to Admission medications   Medication Sig Start Date End Date Taking? Authorizing Provider  baclofen (LIORESAL) 10 MG tablet Take 10 mg by mouth at bedtime.   Yes Historical Provider, MD  Cholecalciferol (VITAMIN D3) 2000 UNITS capsule Take 2,000 Units by mouth daily.   Yes Historical Provider, MD  clonazePAM (KLONOPIN) 1 MG tablet Take 1 mg by mouth daily as needed. Anxiety/sleep 09/28/15  Yes Historical Provider, MD  ibuprofen (ADVIL,MOTRIN) 200 MG tablet Take 800 mg by mouth 3  (three) times daily as needed for headache.   Yes Historical Provider, MD  Investigational - Study Medication Take 80 mg by mouth at bedtime. Additional Study Details:Patient is on a study drug with NEURO- Baclofen ER 40 MG, takes 80 mg at night   Yes Historical Provider, MD  LamoTRIgine (LAMICTAL XR) 100 MG TB24 2 tabs po qhs Patient taking differently: Take 200 mg by mouth at bedtime.  08/05/15  Yes Marcial Pacas, MD  meloxicam (MOBIC) 15 MG tablet take 1 tablet by mouth once daily Patient taking differently: take 1 tablet by mouth  daily 06/12/15  Yes Marcial Pacas, MD  modafinil (PROVIGIL) 200 MG tablet Take 1 tablet (200 mg total) by mouth daily. 10/01/15  Yes Marcial Pacas, MD  OVER THE COUNTER MEDICATION Take 1 tablet by mouth daily. Neuro optimizer   Yes Historical Provider, MD  natalizumab (TYSABRI) 300 MG/15ML injection Inject 300 mg into the vein every 30 (thirty) days.     Historical Provider, MD    Social History:  reports that she quit smoking about 1 years ago. Her smoking use included Cigarettes. She has a 45 pack-year smoking history. She has never used smokeless tobacco. She reports that she drinks alcohol. She reports that she does not use illicit drugs.  Family History  Problem Relation Age of Onset  . Cancer Mother     Colon  . Diabetes Mother   . Hypertension Mother   . Arthritis Mother   .  Cancer Father     prostate  . Kidney disease Brother     congenital single kidney  . Arthritis Sister   . Arthritis Sister   . Hematuria Son   . Gout Brother   . Multiple sclerosis Brother   . Arthritis Brother   . HIV Brother   . Cancer Brother     spinal     All the positives are listed in BOLD  Review of Systems:  Unobtainable due to patient's altered mental status  Physical Exam: Blood pressure 124/70, pulse 60, temperature 97.2 F (36.2 C), temperature source Rectal, resp. rate 10, SpO2 100 %. Constitutional:   Patient is a well-developed and well-nourished female in no  acute distress and cooperative with exam. Head: Normocephalic and atraumatic Mouth: Mucus membranes moist Neck: Supple, No Thyromegaly Cardiovascular: RRR, S1 normal, S2 normal Pulmonary/Chest: CTAB, no wheezes, rales, or rhonchi Abdominal: Soft. Non-tender, non-distended, bowel sounds are normal, no masses, organomegaly, or guarding present.  Neurological: Nonverbal, moving all extremities, does not follow any commands Extremities : No Cyanosis, Clubbing or Edema  Labs on Admission:  Basic Metabolic Panel:  Recent Labs Lab 10/02/15 2010 10/04/15 0914 10/04/15 0922  NA 147* 145 149*  K 3.1* 3.2* 3.2*  CL 115* 115* 113*  CO2 23 20*  --   GLUCOSE 152* 161* 154*  BUN 12 10 10   CREATININE 0.99 1.07* 1.00  CALCIUM 9.1 9.0  --    Liver Function Tests:  Recent Labs Lab 10/02/15 2010 10/04/15 0914  AST 16 14*  ALT 16 15  ALKPHOS 73 73  BILITOT 0.4 0.4  PROT 6.4* 5.8*  ALBUMIN 3.4* 3.3*   No results for input(s): LIPASE, AMYLASE in the last 168 hours.  Recent Labs Lab 10/02/15 2011  AMMONIA 24   CBC:  Recent Labs Lab 10/02/15 2010 10/04/15 0914 10/04/15 0922  WBC 5.0 5.3  --   NEUTROABS 2.4 1.5*  --   HGB 10.9* 11.8* 12.9  HCT 33.8* 36.6 38.0  MCV 83.9 84.7  --   PLT 150 167  --    Cardiac Enzymes:  Recent Labs Lab 10/02/15 2010  CKTOTAL 96     CBG:  Recent Labs Lab 10/04/15 0950  GLUCAP 148*    Radiological Exams on Admission: Ct Head Wo Contrast  10/04/2015  CLINICAL DATA:  Nonverbal and combative. EXAM: CT HEAD WITHOUT CONTRAST TECHNIQUE: Contiguous axial images were obtained from the base of the skull through the vertex without intravenous contrast. COMPARISON:  Head CT dated 09/16/2015 and of brain MRI dated 07/29/2015. FINDINGS: There is mild generalized brain atrophy with commensurate dilatation of the ventricles and sulci. Chronic small vessel ischemic changes again noted within the bilateral periventricular and subcortical white matter  regions. There is no mass, hemorrhage, edema or other evidence of acute parenchymal abnormality. No extra-axial hemorrhage. There are chronic calcified atherosclerotic changes of the large vessels at the skull base. No osseous abnormality seen. Paranasal sinuses and mastoid air cells are clear. Superficial soft tissues are unremarkable. IMPRESSION: 1. Atrophy and chronic ischemic changes in the white matter. 2. No acute findings.  No intracranial mass, hemorrhage or edema. Electronically Signed   By: Franki Cabot M.D.   On: 10/04/2015 10:41   Dg Chest Portable 1 View  10/04/2015  CLINICAL DATA:  Possible aspiration.  Vomiting. EXAM: PORTABLE CHEST 1 VIEW COMPARISON:  September 16, 2015. FINDINGS: Stable cardiomediastinal silhouette. Enlargement and tortuosity of descending thoracic aorta is noted which is stable. No pneumothorax or  pleural effusion is noted. No acute pulmonary disease is noted. Bony thorax is intact. IMPRESSION: No acute cardiopulmonary abnormality seen. Electronically Signed   By: Marijo Conception, M.D.   On: 10/04/2015 09:42    EKG: Independently reviewed.  Normal sinus rhythm, nonspecific ST changes   Assessment/Plan Active Problems:   Hypokalemia   Altered mental status   UTI (lower urinary tract infection)   Altered level of consciousness  Encephalopathy Patient presenting with altered mental status, abnormal UA and metabolic derangements. Start Rocephin pharmacy consultation, follow urine and blood cultures. MRI brain has been ordered, patient seen by neurology. Will order stroke workup only if MRI shows stroke, discussed with Dr. Nicole Kindred  UTI Patient started on Rocephin as above Follow urine culture results  Hypokalemia Replace potassium and check BMP in a.m.  Hypernatremia Sodium is 149, will continue with normal saline at this time. Follow BMP in a.m. and switch to half-normal saline if sodium still elevated and patient stable  History of multiple  sclerosis Patient followed by neurology as outpatient. Hold by mouth medications at this time  Code status:  Family discussion:    Time Spent on Admission: 60 min  [facility] Pager: [phone] 10/04/2015, 2:37 PM  If 7PM-7AM, please contact night-coverage  [email or web address]  Password [email or web address]

## 2015-10-04 NOTE — ED Notes (Signed)
Chaplain paged per pt family request

## 2015-10-04 NOTE — Evaluation (Signed)
Clinical/Bedside Swallow Evaluation Patient Details  Name: Wanda [name] MRN: [id number] Date of Birth: 1951/05/31  Today's Date: 10/04/2015 Time: SLP Start Time (ACUTE ONLY): [id number] SLP Stop Time (ACUTE ONLY): 1558 SLP Time Calculation (min) (ACUTE ONLY): 10 min  Past Medical History:  Past Medical History  Diagnosis Date  . Elevated cholesterol   . Hypertension   . Arthritis   . Cervical dysplasia   . Depression   . Anxiety   . Macular degeneration of right eye 2001  . MS (multiple sclerosis) ([place]) 08/2013  . Acute encephalopathy 03/12/2015   Past Surgical History:  Past Surgical History  Procedure Laterality Date  . Colposcopy    . Knee surgery Bilateral     "had cortisone injections in my knees"  . Cholecystectomy    . Tubal ligation    . Tonsillectomy    . Colonoscopy w/ polypectomy     HPI:  Wanda [name] is an 64 y.o. female history of hypertension, multiple sclerosis, macular degeneration of the right eye, depression and history of encephalopathy in June 2016, brought to the emergency room with decreased responsiveness, reduced movements of right extremities compared to baseline, and agitation. MRI negative for acute infarct.   Assessment / Plan / Recommendation Clinical Impression  Pt has fluctuating mentation throughout evaluation, varying from lethargic and not responding to multimodal stimulation, to alert and actively resisting SLP attempts to offer POs and provide oral care. As a result, pt has minimal acceptance of POs, and no active manipulation of those that she did accept.   Discussed with RN - current mentation makes any PO intake unsafe and ineffective, including medication administration. Recommend to continue NPO. Will f/u for readiness to start PO diet.    Aspiration Risk  Severe aspiration risk    Diet Recommendation  NPO   Medication Administration: Via alternative means    Other  Recommendations Oral Care Recommendations: Oral care QID   Follow  up Recommendations   (tba)    Frequency and Duration min 2x/week  2 weeks       Prognosis Prognosis for Safe Diet Advancement: Good      Swallow Study   General Date of Onset: 10/04/15 HPI: Wanda [name] is an 64 y.o. female history of hypertension, multiple sclerosis, macular degeneration of the right eye, depression and history of encephalopathy in June 2016, brought to the emergency room with decreased responsiveness, reduced movements of right extremities compared to baseline, and agitation. MRI negative for acute infarct. Type of Study: Bedside Swallow Evaluation Previous Swallow Assessment: BSE in June 2016 wtih swallowing WFL, regular diet and thin liquids recommended Diet Prior to this Study: NPO Temperature Spikes Noted: No Respiratory Status: Nasal cannula History of Recent Intubation: No Behavior/Cognition: Other (Comment) (fluctuating alertness throughout eval) Oral Cavity Assessment: Other (comment) (UTA) Oral Care Completed by SLP: Other (Comment) (attempted - difficult to complete due to mentation) Oral Cavity - Dentition: Other (Comment) (UTA) Self-Feeding Abilities: Refused PO Patient Positioning: Upright in bed Baseline Vocal Quality: Not observed Volitional Cough: Cognitively unable to elicit Volitional Swallow: Unable to elicit    Oral/Motor/Sensory Function Overall Oral Motor/Sensory Function: Other (comment) (UTA due to mentation)   Ice Chips Ice chips: Impaired Presentation: Spoon Oral Phase Impairments: Poor awareness of bolus   Thin Liquid Thin Liquid:  (no acceptance)    Nectar Thick Nectar Thick Liquid: Not tested   Honey Thick Honey Thick Liquid: Not tested   Puree Puree: Not tested   Solid  GO    Solid: Not tested      Wanda [name], M.A. CCC-SLP [phone]  Wanda [name] 10/04/2015,4:14 PM

## 2015-10-04 NOTE — Consult Note (Addendum)
Admission H&P    Chief Complaint: Altered mental status with acute encephalopathy.  HPI: Wanda [name] is an 64 y.o. female history of hypertension, multiple sclerosis, macular degeneration of the right eye, depression and history of encephalopathy in June 2016, brought to the emergency room with decreased responsiveness, reduced movements of right extremities compared to baseline, and agitation. Patient had a similar presentation June 2016 with associated urinary tract infection. CT scan of her head today showed chronic white matter changes no acute intracranial abnormality. Findings on urinalysis were indicative of recurrent tract infection. She was hypothermic on presentation with temperature of 95.1. WBC count was normal. Lactic acid was also normal.  Past Medical History  Diagnosis Date  . Elevated cholesterol   . Hypertension   . Arthritis   . Cervical dysplasia   . Depression   . Anxiety   . Macular degeneration of right eye 2001  . MS (multiple sclerosis) ([place]) 08/2013  . Acute encephalopathy 03/12/2015    Past Surgical History  Procedure Laterality Date  . Colposcopy    . Knee surgery Bilateral     "had cortisone injections in my knees"  . Cholecystectomy    . Tubal ligation    . Tonsillectomy    . Colonoscopy w/ polypectomy      Family History  Problem Relation Age of Onset  . Cancer Mother     Colon  . Diabetes Mother   . Hypertension Mother   . Arthritis Mother   . Cancer Father     prostate  . Kidney disease Brother     congenital single kidney  . Arthritis Sister   . Arthritis Sister   . Hematuria Son   . Gout Brother   . Multiple sclerosis Brother   . Arthritis Brother   . HIV Brother   . Cancer Brother     spinal   Social History:  reports that she quit smoking about 1 years ago. Her smoking use included Cigarettes. She has a 45 pack-year smoking history. She has never used smokeless tobacco. She reports that she drinks alcohol. She reports that she  does not use illicit drugs.  Allergies: No Known Allergies  Medications: Patient's preadmission medications were reviewed by me.  ROS: Unavailable due to patient's mental status.  Physical Examination: Blood pressure 124/70, pulse 55, temperature 97.2 F (36.2 C), temperature source Rectal, resp. rate 13, SpO2 100 %.  HEENT-  Normocephalic, no lesions, without obvious abnormality.  Normal external eye and conjunctiva.  Normal TM's bilaterally.  Normal auditory canals and external ears. Normal external nose, mucus membranes and septum.  Normal pharynx. Neck supple with no masses, nodes, nodules or enlargement. Cardiovascular - regular rate and rhythm, S1, S2 normal, no murmur, click, rub or gallop Lungs - chest clear, no wheezing, rales, normal symmetric air entry Abdomen - soft, non-tender; bowel sounds normal; no masses,  no organomegaly Extremities - no joint deformities, effusion, or inflammation and no edema  Neurologic Examination: Patient was obtunded, in part due to Ativan received for agitation, in addition to encephalopathic state. She had no verbal output and did not follow any simple commands area Pupils were equal and reacted normally to light. Extraocular movements were full and conjugate with oculocephalic maneuvers area Face was symmetrical. She had no speech output. Motor exam showed normal strength and muscle tone involving left extremities. She had no spontaneous movements nor withdrawal movements of left extremities to noxious stimuli. Muscle tone on the right was flaccid. Deep tendon reflexes  were trace to 1+ and symmetrical. Plantar responses were mute.  Results for orders placed or performed during the hospital encounter of 10/04/15 (from the past 48 hour(s))  Protime-INR     Status: None   Collection Time: 10/04/15  9:14 AM  Result Value Ref Range   Prothrombin Time 14.2 11.6 - 15.2 seconds   INR 1.08 0.00 - 1.49  APTT     Status: None   Collection Time:  10/04/15  9:14 AM  Result Value Ref Range   aPTT 24 24 - 37 seconds  CBC     Status: Abnormal   Collection Time: 10/04/15  9:14 AM  Result Value Ref Range   WBC 5.3 4.0 - 10.5 K/uL   RBC 4.32 3.87 - 5.11 MIL/uL   Hemoglobin 11.8 (L) 12.0 - 15.0 g/dL   HCT 36.6 36.0 - 46.0 %   MCV 84.7 78.0 - 100.0 fL   MCH 27.3 26.0 - 34.0 pg   MCHC 32.2 30.0 - 36.0 g/dL   RDW 16.2 (H) 11.5 - 15.5 %   Platelets 167 150 - 400 K/uL  Differential     Status: Abnormal   Collection Time: 10/04/15  9:14 AM  Result Value Ref Range   Neutrophils Relative % 29 %   Neutro Abs 1.5 (L) 1.7 - 7.7 K/uL   Lymphocytes Relative 61 %   Lymphs Abs 3.3 0.7 - 4.0 K/uL   Monocytes Relative 5 %   Monocytes Absolute 0.2 0.1 - 1.0 K/uL   Eosinophils Relative 4 %   Eosinophils Absolute 0.2 0.0 - 0.7 K/uL   Basophils Relative 1 %   Basophils Absolute 0.0 0.0 - 0.1 K/uL  Comprehensive metabolic panel     Status: Abnormal   Collection Time: 10/04/15  9:14 AM  Result Value Ref Range   Sodium 145 135 - 145 mmol/L   Potassium 3.2 (L) 3.5 - 5.1 mmol/L   Chloride 115 (H) 101 - 111 mmol/L   CO2 20 (L) 22 - 32 mmol/L   Glucose, Bld 161 (H) 65 - 99 mg/dL   BUN 10 6 - 20 mg/dL   Creatinine, Ser 1.07 (H) 0.44 - 1.00 mg/dL   Calcium 9.0 8.9 - 10.3 mg/dL   Total Protein 5.8 (L) 6.5 - 8.1 g/dL   Albumin 3.3 (L) 3.5 - 5.0 g/dL   AST 14 (L) 15 - 41 U/L   ALT 15 14 - 54 U/L   Alkaline Phosphatase 73 38 - 126 U/L   Total Bilirubin 0.4 0.3 - 1.2 mg/dL   GFR calc non Af Amer 54 (L) >60 mL/min   GFR calc Af Amer >60 >60 mL/min    Comment: (NOTE) The eGFR has been calculated using the CKD EPI equation. This calculation has not been validated in all clinical situations. eGFR's persistently <60 mL/min signify possible Chronic Kidney Disease.    Anion gap 10 5 - 15  I-stat troponin, ED (not at [facility], [facility])     Status: None   Collection Time: 10/04/15  9:20 AM  Result Value Ref Range   Troponin i, poc 0.00 0.00 - 0.08 ng/mL    Comment 3            Comment: Due to the release kinetics of cTnI, a negative result within the first hours of the onset of symptoms does not rule out myocardial infarction with certainty. If myocardial infarction is still suspected, repeat the test at appropriate intervals.   I-Stat Chem 8, ED  (not  at [facility])     Status: Abnormal   Collection Time: 10/04/15  9:22 AM  Result Value Ref Range   Sodium 149 (H) 135 - 145 mmol/L   Potassium 3.2 (L) 3.5 - 5.1 mmol/L   Chloride 113 (H) 101 - 111 mmol/L   BUN 10 6 - 20 mg/dL   Creatinine, Ser 1.00 0.44 - 1.00 mg/dL   Glucose, Bld 154 (H) 65 - 99 mg/dL   Calcium, Ion 1.25 1.13 - 1.30 mmol/L   TCO2 21 0 - 100 mmol/L   Hemoglobin 12.9 12.0 - 15.0 g/dL   HCT 38.0 36.0 - 46.0 %  CBG monitoring, ED     Status: Abnormal   Collection Time: 10/04/15  9:50 AM  Result Value Ref Range   Glucose-Capillary 148 (H) 65 - 99 mg/dL   Comment 1 Notify RN    Comment 2 Document in Chart   Urine rapid drug screen (hosp performed)     Status: Abnormal   Collection Time: 10/04/15 10:20 AM  Result Value Ref Range   Opiates NONE DETECTED NONE DETECTED   Cocaine NONE DETECTED NONE DETECTED   Benzodiazepines POSITIVE (A) NONE DETECTED   Amphetamines NONE DETECTED NONE DETECTED   Tetrahydrocannabinol NONE DETECTED NONE DETECTED   Barbiturates NONE DETECTED NONE DETECTED    Comment:        DRUG SCREEN FOR MEDICAL PURPOSES ONLY.  IF CONFIRMATION IS NEEDED FOR ANY PURPOSE, NOTIFY LAB WITHIN 5 DAYS.        LOWEST DETECTABLE LIMITS FOR URINE DRUG SCREEN Drug Class       Cutoff (ng/mL) Amphetamine      1000 Barbiturate      200 Benzodiazepine   161 Tricyclics       096 Opiates          300 Cocaine          300 THC              50   Urinalysis, Routine w reflex microscopic (not at [facility])     Status: Abnormal   Collection Time: 10/04/15 10:29 AM  Result Value Ref Range   Color, Urine YELLOW YELLOW   APPearance HAZY (A) CLEAR   Specific Gravity, Urine  1.022 1.005 - 1.030   pH 5.5 5.0 - 8.0   Glucose, UA NEGATIVE NEGATIVE mg/dL   Hgb urine dipstick NEGATIVE NEGATIVE   Bilirubin Urine NEGATIVE NEGATIVE   Ketones, ur NEGATIVE NEGATIVE mg/dL   Protein, ur NEGATIVE NEGATIVE mg/dL   Nitrite NEGATIVE NEGATIVE   Leukocytes, UA MODERATE (A) NEGATIVE  Urine microscopic-add on     Status: Abnormal   Collection Time: 10/04/15 10:29 AM  Result Value Ref Range   Squamous Epithelial / LPF 0-5 (A) NONE SEEN   WBC, UA TOO NUMEROUS TO COUNT 0 - 5 WBC/hpf   RBC / HPF NONE SEEN 0 - 5 RBC/hpf   Bacteria, UA RARE (A) NONE SEEN   Casts HYALINE CASTS (A) NEGATIVE  TSH     Status: None   Collection Time: 10/04/15 10:40 AM  Result Value Ref Range   TSH 0.456 0.350 - 4.500 uIU/mL  Lactic acid, plasma     Status: None   Collection Time: 10/04/15 10:40 AM  Result Value Ref Range   Lactic Acid, Venous 1.9 0.5 - 2.0 mmol/L   Ct Head Wo Contrast  10/04/2015  CLINICAL DATA:  Nonverbal and combative. EXAM: CT HEAD WITHOUT CONTRAST TECHNIQUE: Contiguous axial images were obtained from  the base of the skull through the vertex without intravenous contrast. COMPARISON:  Head CT dated 09/16/2015 and of brain MRI dated 07/29/2015. FINDINGS: There is mild generalized brain atrophy with commensurate dilatation of the ventricles and sulci. Chronic small vessel ischemic changes again noted within the bilateral periventricular and subcortical white matter regions. There is no mass, hemorrhage, edema or other evidence of acute parenchymal abnormality. No extra-axial hemorrhage. There are chronic calcified atherosclerotic changes of the large vessels at the skull base. No osseous abnormality seen. Paranasal sinuses and mastoid air cells are clear. Superficial soft tissues are unremarkable. IMPRESSION: 1. Atrophy and chronic ischemic changes in the white matter. 2. No acute findings.  No intracranial mass, hemorrhage or edema. Electronically Signed   By: Franki Cabot M.D.   On:  10/04/2015 10:41   Dg Chest Portable 1 View  10/04/2015  CLINICAL DATA:  Possible aspiration.  Vomiting. EXAM: PORTABLE CHEST 1 VIEW COMPARISON:  September 16, 2015. FINDINGS: Stable cardiomediastinal silhouette. Enlargement and tortuosity of descending thoracic aorta is noted which is stable. No pneumothorax or pleural effusion is noted. No acute pulmonary disease is noted. Bony thorax is intact. IMPRESSION: No acute cardiopulmonary abnormality seen. Electronically Signed   By: Marijo Conception, M.D.   On: 10/04/2015 09:42    Assessment/Plan 64 year old lady with history of multiple sclerosis with baseline right-sided weakness, as well as history of encephalopathy associated with urinary tract infection presenting with recurrent acute encephalopathic state with associated acute urinary tract infection, as well as reduced movements of right extremities. Significance of worsening of right-sided weakness is unclear. Acute stroke is unlikely but cannot be ruled out area, as well acute relapsing multiple sclerosis manifested as worsening of right-sided weakness cannot be ruled out.  Recommendations: 1. MRI of the brain without contrast recurrent stroke or acute demyelinating lesion. 2. No indication for workup if acute stroke is not demonstrated on MRI study. 3. No change in current meds for management of MS. 4. Antibiotic management per primary team. 5. Physical therapy and occupational therapy consults when the patient is more stable.  C.R. Nicole Kindred, [place] [facility] [phone]  10/04/2015, 2:31 PM

## 2015-10-04 NOTE — ED Notes (Signed)
Nicole Kindred, Neurologist at bedside

## 2015-10-04 NOTE — ED Notes (Signed)
This RN with pt in MRI for monitoring of vital signs

## 2015-10-04 NOTE — ED Notes (Signed)
Pt in from home via [facility], per report pt was LSN by daughter yesterday & 19:00, pt found today in bed, supine vomiting by daughter, pt noted to not moving her R arm & R leg as much as her Left, pt noted to wince on her L side, pt has MS, pt reported to have more weakness on R leg at baseline, pt normally A&O x4, follows commands, speaks in complete sentences, pt noted to have fell yesterday without LOC & hitting head per daughter,pt non verbal upon arrival to ED

## 2015-10-04 NOTE — ED Notes (Signed)
Attempted report 

## 2015-10-04 NOTE — ED Provider Notes (Addendum)
CSN: [id number]     Arrival date & time    History   First MD Initiated Contact with Patient 10/04/15 0910     Chief Complaint  Patient presents with  . Cerebrovascular Accident     HPI Pt in from home via [facility], per report pt was LSN by daughter yesterday & 19:00, pt found today in bed, supine vomiting by daughter, pt noted to not moving her R arm & R leg as much as her Left, pt noted to wince on her L side, pt has MS, pt reported to have more weakness on R leg at baseline, pt normally A&O x4, follows commands, speaks in complete sentences, pt noted to have fell yesterday without LOC & hitting head per daughter,pt non verbal upon arrival to ED Past Medical History  Diagnosis Date  . Elevated cholesterol   . Hypertension   . Arthritis   . Cervical dysplasia   . Depression   . Anxiety   . Macular degeneration of right eye 2001  . MS (multiple sclerosis) ([place]) 08/2013  . Acute encephalopathy 03/12/2015   Past Surgical History  Procedure Laterality Date  . Colposcopy    . Knee surgery Bilateral     "had cortisone injections in my knees"  . Cholecystectomy    . Tubal ligation    . Tonsillectomy    . Colonoscopy w/ polypectomy     Family History  Problem Relation Age of Onset  . Cancer Mother     Colon  . Diabetes Mother   . Hypertension Mother   . Arthritis Mother   . Cancer Father     prostate  . Kidney disease Brother     congenital single kidney  . Arthritis Sister   . Arthritis Sister   . Hematuria Son   . Gout Brother   . Multiple sclerosis Brother   . Arthritis Brother   . HIV Brother   . Cancer Brother     spinal   Social History  Substance Use Topics  . Smoking status: Former Smoker -- 1.00 packs/day for 45 years    Types: Cigarettes    Quit date: 10/23/2013  . Smokeless tobacco: Never Used     Comment: Quit smoking in 2014.  Marland Kitchen Alcohol Use: 0.0 oz/week    0 Standard drinks or equivalent per week     Comment: hasn't drank anything since jan 2015   OB  History    Gravida Para Term Preterm AB TAB SAB Ectopic Multiple Living   6 3 3  3  3   2      Review of Systems  Unable to perform ROS: Acuity of condition      Allergies  Review of patient's allergies indicates no known allergies.  Home Medications   Prior to Admission medications   Medication Sig Start Date End Date Taking? Authorizing Provider  baclofen (LIORESAL) 10 MG tablet Take 10 mg by mouth at bedtime.   Yes Historical Provider, MD  Cholecalciferol (VITAMIN D3) 2000 UNITS capsule Take 2,000 Units by mouth daily.   Yes Historical Provider, MD  clonazePAM (KLONOPIN) 1 MG tablet Take 1 mg by mouth daily as needed. Anxiety/sleep 09/28/15  Yes Historical Provider, MD  ibuprofen (ADVIL,MOTRIN) 200 MG tablet Take 800 mg by mouth 3 (three) times daily as needed for headache.   Yes Historical Provider, MD  Investigational - Study Medication Take 80 mg by mouth at bedtime. Additional Study Details:Patient is on a study drug with NEURO- Baclofen  ER 40 MG, takes 80 mg at night   Yes Historical Provider, MD  LamoTRIgine (LAMICTAL XR) 100 MG TB24 2 tabs po qhs Patient taking differently: Take 200 mg by mouth at bedtime.  08/05/15  Yes Marcial Pacas, MD  meloxicam (MOBIC) 15 MG tablet take 1 tablet by mouth once daily Patient taking differently: take 1 tablet by mouth  daily 06/12/15  Yes Marcial Pacas, MD  modafinil (PROVIGIL) 200 MG tablet Take 1 tablet (200 mg total) by mouth daily. 10/01/15  Yes Marcial Pacas, MD  OVER THE COUNTER MEDICATION Take 1 tablet by mouth daily. Neuro optimizer   Yes Historical Provider, MD  natalizumab (TYSABRI) 300 MG/15ML injection Inject 300 mg into the vein every 30 (thirty) days.     Historical Provider, MD   BP 124/70 mmHg  Pulse 4  Temp(Src) 96.3 F (35.7 C) (Rectal)  Resp 12  SpO2 100% Physical Exam  Constitutional: She appears well-developed and well-nourished. She appears distressed.  HENT:  Head: Normocephalic and atraumatic.  Eyes: Pupils are equal,  round, and reactive to light.  Neck: Normal range of motion.  Cardiovascular: Normal rate and intact distal pulses.   Pulmonary/Chest: No respiratory distress.  Abdominal: Normal appearance and bowel sounds are normal. She exhibits no distension. There is no tenderness. There is no rebound.  Musculoskeletal: Normal range of motion.  Neurological: No cranial nerve deficit. GCS eye subscore is 3. GCS verbal subscore is 4. GCS motor subscore is 5.  Skin: Skin is warm and dry. No rash noted.  Nursing note and vitals reviewed.   ED Course  Procedures (including critical care time) Medications  ondansetron (ZOFRAN) injection 4 mg (4 mg Intravenous Not Given 10/04/15 1039)  0.9 %  sodium chloride infusion (125 mL/hr Intravenous New Bag/Given 10/04/15 1220)  cefTRIAXone (ROCEPHIN) 2 g in dextrose 5 % 50 mL IVPB (not administered)  ondansetron (ZOFRAN) injection 4 mg (4 mg Intravenous Given 10/04/15 0931)  LORazepam (ATIVAN) injection 2 mg (2 mg Intravenous Given 10/04/15 0955)  sodium chloride 0.9 % bolus 500 mL (0 mLs Intravenous Stopped 10/04/15 1220)  LORazepam (ATIVAN) injection 2 mg (2 mg Intravenous Given 10/04/15 1234)    CRITICAL CARE Performed by: Leonard Schwartz L Total critical care time: 30  minutes Critical care time was exclusive of separately billable procedures and treating other patients. Critical care was necessary to treat or prevent imminent or life-threatening deterioration. Critical care was time spent personally by me on the following activities: development of treatment plan with patient and/or surrogate as well as nursing, discussions with consultants, evaluation of patient's response to treatment, examination of patient, obtaining history from patient or surrogate, ordering and performing treatments and interventions, ordering and review of laboratory studies, ordering and review of radiographic studies, pulse oximetry and re-evaluation of patient's condition.  Labs  Review Labs Reviewed  CBC - Abnormal; Notable for the following:    Hemoglobin 11.8 (*)    RDW 16.2 (*)    All other components within normal limits  DIFFERENTIAL - Abnormal; Notable for the following:    Neutro Abs 1.5 (*)    All other components within normal limits  COMPREHENSIVE METABOLIC PANEL - Abnormal; Notable for the following:    Potassium 3.2 (*)    Chloride 115 (*)    CO2 20 (*)    Glucose, Bld 161 (*)    Creatinine, Ser 1.07 (*)    Total Protein 5.8 (*)    Albumin 3.3 (*)    AST 14 (*)  GFR calc non Af Amer 54 (*)    All other components within normal limits  URINALYSIS, ROUTINE W REFLEX MICROSCOPIC (NOT AT [facility]) - Abnormal; Notable for the following:    APPearance HAZY (*)    Leukocytes, UA MODERATE (*)    All other components within normal limits  URINE RAPID DRUG SCREEN, HOSP PERFORMED - Abnormal; Notable for the following:    Benzodiazepines POSITIVE (*)    All other components within normal limits  URINE MICROSCOPIC-ADD ON - Abnormal; Notable for the following:    Squamous Epithelial / LPF 0-5 (*)    Bacteria, UA RARE (*)    Casts HYALINE CASTS (*)    All other components within normal limits  CBG MONITORING, ED - Abnormal; Notable for the following:    Glucose-Capillary 148 (*)    All other components within normal limits  I-STAT CHEM 8, ED - Abnormal; Notable for the following:    Sodium 149 (*)    Potassium 3.2 (*)    Chloride 113 (*)    Glucose, Bld 154 (*)    All other components within normal limits  URINE CULTURE  PROTIME-INR  APTT  TSH  LACTIC ACID, PLASMA  I-STAT TROPOININ, ED    Imaging Review Ct Head Wo Contrast  10/04/2015  CLINICAL DATA:  Nonverbal and combative. EXAM: CT HEAD WITHOUT CONTRAST TECHNIQUE: Contiguous axial images were obtained from the base of the skull through the vertex without intravenous contrast. COMPARISON:  Head CT dated 09/16/2015 and of brain MRI dated 07/29/2015. FINDINGS: There is mild generalized brain  atrophy with commensurate dilatation of the ventricles and sulci. Chronic small vessel ischemic changes again noted within the bilateral periventricular and subcortical white matter regions. There is no mass, hemorrhage, edema or other evidence of acute parenchymal abnormality. No extra-axial hemorrhage. There are chronic calcified atherosclerotic changes of the large vessels at the skull base. No osseous abnormality seen. Paranasal sinuses and mastoid air cells are clear. Superficial soft tissues are unremarkable. IMPRESSION: 1. Atrophy and chronic ischemic changes in the white matter. 2. No acute findings.  No intracranial mass, hemorrhage or edema. Electronically Signed   By: Franki Cabot M.D.   On: 10/04/2015 10:41   Dg Chest Portable 1 View  10/04/2015  CLINICAL DATA:  Possible aspiration.  Vomiting. EXAM: PORTABLE CHEST 1 VIEW COMPARISON:  September 16, 2015. FINDINGS: Stable cardiomediastinal silhouette. Enlargement and tortuosity of descending thoracic aorta is noted which is stable. No pneumothorax or pleural effusion is noted. No acute pulmonary disease is noted. Bony thorax is intact. IMPRESSION: No acute cardiopulmonary abnormality seen. Electronically Signed   By: Marijo Conception, M.D.   On: 10/04/2015 09:42   I have personally reviewed and evaluated these images and lab results as part of my medical decision-making.   EKG Interpretation   Date/Time:  Friday October 04 2015 09:05:47 EST Ventricular Rate:  71 PR Interval:  159 QRS Duration: 87 QT Interval:  398 QTC Calculation: 432 R Axis:   46 Text Interpretation:  Sinus rhythm Borderline ST elevation, lateral leads  No significant change since last tracing Confirmed by Barbra Miner  MD, Elray Dains  ([id number]) on 10/04/2015 9:14:12 AM      MDM   Final diagnoses:  UTI (lower urinary tract infection)        Leonard Schwartz, MD 10/04/15 [place], MD 11/02/15 [phone]

## 2015-10-05 DIAGNOSIS — G35 Multiple sclerosis: Secondary | ICD-10-CM

## 2015-10-05 DIAGNOSIS — M6289 Other specified disorders of muscle: Secondary | ICD-10-CM

## 2015-10-05 DIAGNOSIS — R531 Weakness: Secondary | ICD-10-CM | POA: Insufficient documentation

## 2015-10-05 LAB — COMPREHENSIVE METABOLIC PANEL
ALK PHOS: 71 U/L (ref 38–126)
ALT: 16 U/L (ref 14–54)
AST: 21 U/L (ref 15–41)
Albumin: 3.3 g/dL — ABNORMAL LOW (ref 3.5–5.0)
Anion gap: 10 (ref 5–15)
BILIRUBIN TOTAL: 0.4 mg/dL (ref 0.3–1.2)
BUN: <5 mg/dL — ABNORMAL LOW (ref 6–20)
CALCIUM: 9 mg/dL (ref 8.9–10.3)
CO2: 23 mmol/L (ref 22–32)
CREATININE: 0.86 mg/dL (ref 0.44–1.00)
Chloride: 115 mmol/L — ABNORMAL HIGH (ref 101–111)
GFR calc non Af Amer: >60 mL/min (ref >60)
Glucose, Bld: 92 mg/dL (ref 65–99)
Potassium: 3.5 mmol/L (ref 3.5–5.1)
SODIUM: 148 mmol/L — AB (ref 135–145)
TOTAL PROTEIN: 6.1 g/dL — AB (ref 6.5–8.1)

## 2015-10-05 LAB — CBC
HEMATOCRIT: 33.1 % — AB (ref 36.0–46.0)
HEMOGLOBIN: 11 g/dL — AB (ref 12.0–15.0)
MCH: 27.9 pg (ref 26.0–34.0)
MCHC: 33.2 g/dL (ref 30.0–36.0)
MCV: 84 fL (ref 78.0–100.0)
Platelets: 159 10*3/uL (ref 150–400)
RBC: 3.94 MIL/uL (ref 3.87–5.11)
RDW: 16.2 % — ABNORMAL HIGH (ref 11.5–15.5)
WBC: 6.8 10*3/uL (ref 4.0–10.5)

## 2015-10-05 LAB — URINE CULTURE: CULTURE: NO GROWTH

## 2015-10-05 LAB — TROPONIN I: Troponin I: <0.03 ng/mL (ref <0.031)

## 2015-10-05 MED ORDER — SODIUM CHLORIDE 0.45 % IV SOLN
INTRAVENOUS | Status: DC
  Administered 2015-10-05: 06:00:00 via INTRAVENOUS

## 2015-10-05 MED ORDER — ERYTHROMYCIN 5 MG/GM OP OINT
TOPICAL_OINTMENT | Freq: Every day | OPHTHALMIC | Status: DC
  Administered 2015-10-05 – 2015-10-06 (×2): via OPHTHALMIC
  Filled 2015-10-05: qty 3.5

## 2015-10-05 MED ORDER — ARTIFICIAL TEARS OP OINT
TOPICAL_OINTMENT | OPHTHALMIC | Status: DC | PRN
  Filled 2015-10-05: qty 3.5

## 2015-10-05 MED ORDER — DIPHENHYDRAMINE HCL 25 MG PO CAPS
25.0000 mg | ORAL_CAPSULE | Freq: Once | ORAL | Status: AC
  Administered 2015-10-05: 25 mg via ORAL
  Filled 2015-10-05: qty 1

## 2015-10-05 MED ORDER — VANCOMYCIN HCL IN DEXTROSE 1-5 GM/200ML-% IV SOLN
1000.0000 mg | INTRAVENOUS | Status: DC
  Administered 2015-10-05 – 2015-10-06 (×2): 1000 mg via INTRAVENOUS
  Filled 2015-10-05 (×3): qty 200

## 2015-10-05 NOTE — Progress Notes (Signed)
Pt transferred from [facility] to 5W-07. Pt is Alert and Oriented to person & place. Pt disoriented to time & situation. Pt very impulsive. Pt is high fall risk. Bed alarm on, bed remains in lowest position and call bell is within reach. Will continue to monitor pt throughout day.

## 2015-10-05 NOTE — Progress Notes (Signed)
Speech Language Pathology Treatment: Dysphagia  Patient Details Name: MIETTE MOLENDA MRN: [id number] DOB: 1951-04-20 Today's Date: 10/05/2015 Time: 8016-5537 SLP Time Calculation (min) (ACUTE ONLY): 10 min  Assessment / Plan / Recommendation Clinical Impression  F/u diagnostic treatment complete. Patient remains confused with very poor sustained attention requiring max clinician assist for task completion however with improved alertness as compared to 12/30. Patient able to consume clinician provided po trials with seemingly normal oropharyngeal swallowing function and no overt indication of aspiration. Max tactile cueing provided for safe swallowing precautions due to impulsivity with AMS which will likely continue until mentation improves. No f/u SLP needs indicated at this time. RN to provided full supervision with meals to direct focus to task and control impulsivity (bite and sip size) until mentation improves.    HPI HPI: ADISYN RUSCITTI is an 64 y.o. female history of hypertension, multiple sclerosis, macular degeneration of the right eye, depression and history of encephalopathy in June 2016, brought to the emergency room with decreased responsiveness, reduced movements of right extremities compared to baseline, and agitation. MRI negative for acute infarct.      SLP Plan  All goals met     Recommendations  Diet recommendations: Regular;Thin liquid Liquids provided via: Cup;Straw Medication Administration: Whole meds with liquid Supervision: Patient able to self feed;Full supervision/cueing for compensatory strategies Compensations: Slow rate;Small sips/bites;Minimize environmental distractions Postural Changes and/or Swallow Maneuvers: Seated upright 90 degrees              Oral Care Recommendations: Oral care BID Follow up Recommendations: None Plan: All goals met  Gabriel Rainwater MA, CCC-SLP [phone]  Kharisma Glasner Meryl 10/05/2015, 11:42 AM

## 2015-10-05 NOTE — Plan of Care (Signed)
Problem: SLP Dysphagia Goals Goal: Patient will demonstrate readiness for PO's Patient will demonstrate readiness for PO's and/or instrumental swallow study as evidenced by:  Outcome: Completed/Met Date Met:  10/05/15 Patient will continue to require max assist due to AMS which can be provided by nursing however demonstrates a normal swallow

## 2015-10-05 NOTE — Progress Notes (Signed)
ANTIBIOTIC CONSULT NOTE - INITIAL  Pharmacy Consult for vancomycin Indication: Possible periorbital cellulitis / blepharitis  Allergies  Allergen Reactions  . Baclofen     Confusion, muscle weakness, lethargy    Patient Measurements: Height: 5' (152.4 cm) Weight: 120 lb 2.4 oz (54.5 kg) IBW/kg (Calculated) : 45.5  Vital Signs: Temp: 98.4 F (36.9 C) (12/31 0905) Temp Source: Oral (12/31 0905) BP: 152/92 mmHg (12/31 1503) Pulse Rate: 91 (12/31 1503) Intake/Output from previous day: 12/30 0701 - 12/31 0700 In: 2449.6 [I.V.:2049.6; IV Piggyback:400] Out: 2300 [Urine:2300] Intake/Output from this shift:    Labs:  Recent Labs  10/04/15 0914 10/04/15 0922 10/04/15 1725 10/05/15 0447  WBC 5.3  --  6.8 6.8  HGB 11.8* 12.9 10.9* 11.0*  PLT 167  --  156 159  CREATININE 1.07* 1.00 0.84 0.86   Estimated Creatinine Clearance: 47.5 mL/min (by C-G formula based on Cr of 0.86). No results for input(s): VANCOTROUGH, VANCOPEAK, VANCORANDOM, GENTTROUGH, GENTPEAK, GENTRANDOM, TOBRATROUGH, TOBRAPEAK, TOBRARND, AMIKACINPEAK, AMIKACINTROU, AMIKACIN in the last 72 hours.   Microbiology: Recent Results (from the past 720 hour(s))  Urine culture     Status: None   Collection Time: 09/16/15  1:30 PM  Result Value Ref Range Status   Specimen Description URINE, CLEAN CATCH  Final   Special Requests NONE  Final   Culture   Final    >=100,000 COLONIES/mL ESCHERICHIA COLI Performed at [facility]    Report Status 09/18/2015 FINAL  Final   Organism ID, Bacteria ESCHERICHIA COLI  Final      Susceptibility   Escherichia coli - MIC*    AMPICILLIN >=32 RESISTANT Resistant     CEFAZOLIN <=4 SENSITIVE Sensitive     CEFTRIAXONE <=1 SENSITIVE Sensitive     CIPROFLOXACIN <=0.25 SENSITIVE Sensitive     GENTAMICIN <=1 SENSITIVE Sensitive     IMIPENEM <=0.25 SENSITIVE Sensitive     NITROFURANTOIN <=16 SENSITIVE Sensitive     TRIMETH/SULFA >=320 RESISTANT Resistant    AMPICILLIN/SULBACTAM 16 INTERMEDIATE Intermediate     PIP/TAZO <=4 SENSITIVE Sensitive     * >=100,000 COLONIES/mL ESCHERICHIA COLI  Urine culture     Status: None   Collection Time: 10/04/15 10:20 AM  Result Value Ref Range Status   Specimen Description URINE, CATHETERIZED  Final   Special Requests NONE  Final   Culture NO GROWTH 1 DAY  Final   Report Status 10/05/2015 FINAL  Final  Culture, blood (Routine X 2) w Reflex to ID Panel     Status: None (Preliminary result)   Collection Time: 10/04/15  5:25 PM  Result Value Ref Range Status   Specimen Description BLOOD LEFT ANTECUBITAL  Final   Special Requests BOTTLES DRAWN AEROBIC AND ANAEROBIC 5CC  Final   Culture NO GROWTH < 24 HOURS  Final   Report Status PENDING  Incomplete  Culture, blood (Routine X 2) w Reflex to ID Panel     Status: None (Preliminary result)   Collection Time: 10/04/15  5:30 PM  Result Value Ref Range Status   Specimen Description BLOOD LEFT HAND  Final   Special Requests IN PEDIATRIC BOTTLE 0.5CC  Final   Culture NO GROWTH < 24 HOURS  Final   Report Status PENDING  Incomplete    Medical History: Past Medical History  Diagnosis Date  . Elevated cholesterol   . Hypertension   . Arthritis   . Cervical dysplasia   . Depression   . Anxiety   . Macular degeneration of right eye  2001  . MS (multiple sclerosis) ([place]) 08/2013  . Acute encephalopathy 03/12/2015   Assessment: 64 yo F presents on 12/30 with AMS. Started on Rocephin for possible UTI. Now has swelling and inflammation of her upper eyelid. Pharmacy to dose vancomycin for possible cellulitis. Afebrile, WBC wnl. SCr stable, CrCl ~34ml/min.   Goal of Therapy:  Resolution of infection Vancomycin trough 10-15 mcg/mL  Plan:  Start vancomycin 1g IV Q24 Monitor clinical picture, renal function, VT prn F/U C&S, abx deescalation / LOT  Elenor Quinones, PharmD, BCPS Clinical Pharmacist Pager [phone] 10/05/2015 7:06 PM

## 2015-10-05 NOTE — Progress Notes (Signed)
Follow-up note Patient was noted to have swelling/inflammation of her upper eyelid this morning on my examination. Initially I felt this could represent blepharitis and recommended placing warm compresses. I was called by RN notified of worsening of swelling. I came to reassess patient this evening there appears to be increasing swelling of upper eyelid with now involvement of lower eyelid. Unsure of this represents blepharitis or developing periorbital cellulitis. She is currently receiving ceftriaxone. Will expand coverage by adding IV vancomycin. See how she does overnight and reassess in a.m.

## 2015-10-05 NOTE — Progress Notes (Signed)
Subjective: Improved mental status with increased level of responsiveness. Patient is confused however and agitated.  Objective: Current vital signs: BP 159/82 mmHg  Pulse 76  Temp(Src) 98.4 F (36.9 C) (Oral)  Resp 19  Ht 5' (1.524 m)  Wt 54.5 kg (120 lb 2.4 oz)  BMI 23.47 kg/m2  SpO2 100%  Neurologic Exam: Patient was alert and disoriented to time as well as place. She also confabulated rather freely. She was in no acute distress. Extraocular movements were full and conjugate. Face was symmetrical Speech was normal  MRI on 10/04/2015 showed chronic demyelinating changes with no acute intracranial abnormality, including signs of acute stroke.  Medications: I have reviewed the patient's current medications.  Assessment/Plan: 64 year old lady admitted with encephalopathy which has improved. Patient is alert at this point, although she is still quite confused. MRI showed no acute intracranial abnormality. Acute status changes most likely secondary to patient's current infectious process.  No changes in current management recommended. I will continue to follow this patient with you.  C.R. Nicole Kindred, MD [facility] [phone]  10/05/2015  12:43 PM

## 2015-10-05 NOTE — Progress Notes (Signed)
[facility] PROGRESS NOTE  Wanda [name] [id number] DOB: 07/28/1951 DOA: 10/04/2015 PCP: JEFFERY,[name], PA-C  Assessment/Plan: 1. Acute encephalopathy. -Wanda [name] presents with decreased responsiveness. Initial workup included CT scan of brain which was negative for acute intracranial abnormality. She was further worked up with MRI of the brain which did not show evidence of acute CVA. -Neurology does not recommend CVA workup if MRI is negative. -Ammonia level checked on 10/02/2015, back negative at 24. -Urinalysis did reveal the presence of pyuria with rare bacteria.  -On this mornings evaluation she is more responsive however remains confused. She was unable to tell me where she was, what month this is, day of the week, or what season it is. -Continue IV fluids and IV antimicrobial therapy -Given improvement will transfer to [place].  2.  Question urinary tract infection. -Patient presenting with acute encephalopathy, with urinalysis showing pyuria. -Plan to continue ceftriaxone until urine cultures come back.  3.  History of multiple sclerosis. -MRI of brain for non-admission showing advanced multiple sclerosis without significant change from previous study on 07/29/2015. -Neurology following.  4.  History of hypertension. -Initially presented with low blood pressures. -Will monitor off of antihypertensive agents for now.  Code Status: Full code Family Communication: Family not present Disposition Plan: Plan to transfer to Med[facility]urg  Antibiotics:  Ceftriaxone  HPI/Subjective: Wanda [name] is a 64 year old female with a past medical history of multiple sclerosis, presented to the emergency room yesterday with decreased responsiveness. She has had previous hospitalizations for mental status changes in setting of UTI and possibly psychotropic medications. Initial workup included a CT scan of brain which was negative for acute intracranial abnormality. Urinalysis was positive  for urinary tract infection. Neurology was consulted  Objective: Filed Vitals:   10/05/15 0600 10/05/15 0700  BP: 135/99 144/83  Pulse: 69 71  Temp: 98.8 F (37.1 C) 99 F (37.2 C)  Resp: 13 15    Intake/Output Summary (Last 24 hours) at 10/05/15 [id number] Last data filed at 10/05/15 [id number]  Gross per 24 hour  Intake 2449.58 ml  Output   2300 ml  Net 149.58 ml   Filed Weights   10/04/15 1532  Weight: 54.5 kg (120 lb 2.4 oz)    Exam:   General:  She is awake and alert however confused, disoriented, cannot tell me where she is. She is able to follow commands.  Cardiovascular: Regular rate rhythm normal S1-S2  Respiratory: Normal respiratory effort, lungs are clear to auscultation bilaterally  Abdomen: Soft nontender nondistended  Musculoskeletal: No edema  Neurological: She has 2-5 muscle strength to right lower extremity, 5 of 5 muscle strength to left lower extremity, 5 out of 5 muscle strength to bilateral upper extremities.  Data Reviewed: Basic Metabolic Panel:  Recent Labs Lab 10/02/15 2010 10/04/15 0914 10/04/15 0922 10/04/15 1725 10/05/15 0447  NA 147* 145 149*  --  148*  K 3.1* 3.2* 3.2*  --  3.5  CL 115* 115* 113*  --  115*  CO2 23 20*  --   --  23  GLUCOSE 152* 161* 154*  --  92  BUN 12 10 10   --  <5*  CREATININE 0.99 1.07* 1.00 0.84 0.86  CALCIUM 9.1 9.0  --   --  9.0   Liver Function Tests:  Recent Labs Lab 10/02/15 2010 10/04/15 0914 10/05/15 0447  AST 16 14* 21  ALT 16 15 16   ALKPHOS 73 73 71  BILITOT 0.4 0.4 0.4  PROT 6.4* 5.8* 6.1*  ALBUMIN  3.4* 3.3* 3.3*   No results for input(s): LIPASE, AMYLASE in the last 168 hours.  Recent Labs Lab 10/02/15 2011  AMMONIA 24   CBC:  Recent Labs Lab 10/02/15 2010 10/04/15 0914 10/04/15 0922 10/04/15 1725 10/05/15 0447  WBC 5.0 5.3  --  6.8 6.8  NEUTROABS 2.4 1.5*  --   --   --   HGB 10.9* 11.8* 12.9 10.9* 11.0*  HCT 33.8* 36.6 38.0 33.7* 33.1*  MCV 83.9 84.7  --  86.0 84.0  PLT  150 167  --  156 159   Cardiac Enzymes:  Recent Labs Lab 10/02/15 2010 10/04/15 1725 10/04/15 2221 10/05/15 0447  CKTOTAL 96  --   --   --   TROPONINI  --  <0.03 <0.03 <0.03   BNP (last 3 results) No results for input(s): BNP in the last 8760 hours.  ProBNP (last 3 results) No results for input(s): PROBNP in the last 8760 hours.  CBG:  Recent Labs Lab 10/04/15 0950  GLUCAP 148*    No results found for this or any previous visit (from the past 240 hour(s)).   Studies: Ct Head Wo Contrast  10/04/2015  CLINICAL DATA:  Nonverbal and combative. EXAM: CT HEAD WITHOUT CONTRAST TECHNIQUE: Contiguous axial images were obtained from the base of the skull through the vertex without intravenous contrast. COMPARISON:  Head CT dated 09/16/2015 and of brain MRI dated 07/29/2015. FINDINGS: There is mild generalized brain atrophy with commensurate dilatation of the ventricles and sulci. Chronic small vessel ischemic changes again noted within the bilateral periventricular and subcortical white matter regions. There is no mass, hemorrhage, edema or other evidence of acute parenchymal abnormality. No extra-axial hemorrhage. There are chronic calcified atherosclerotic changes of the large vessels at the skull base. No osseous abnormality seen. Paranasal sinuses and mastoid air cells are clear. Superficial soft tissues are unremarkable. IMPRESSION: 1. Atrophy and chronic ischemic changes in the white matter. 2. No acute findings.  No intracranial mass, hemorrhage or edema. Electronically Signed   By: Franki Cabot M.D.   On: 10/04/2015 10:41   Mr Brain Wo Contrast  10/04/2015  CLINICAL DATA:  Right arm and leg weakness. Altered mental status. Nonverbal. Vomiting. Fall yesterday. History of multiple sclerosis. EXAM: MRI HEAD WITHOUT CONTRAST TECHNIQUE: Multiplanar, multiecho pulse sequences of the brain and surrounding structures were obtained without intravenous contrast. COMPARISON:  Head CT  10/04/2015 and MRI 07/29/2015 FINDINGS: Mild diffusion weighted signal hyperintensity in the left greater than right corona radiata and internal capsules is similar to the prior MRI and and without restricted diffusion. No restricted diffusion is seen elsewhere to indicate acute infarct. 6 mm subependymal lesion with blood products and fluid fluid level in the atrium of the left lateral ventricle is unchanged in size although demonstrates less diffusion-weighted signal abnormality than on the prior MRI. Confluent T2 hyperintensities in the subcortical and deep cerebral white matter including prominent periventricular involvement as well as patchy T2 hyperintensity in the pons are unchanged. Multiple black holes are noted. There is prominent atrophy of the corpus callosum. No cerebellar lesions are identified. There is moderate cerebral atrophy. There may be mild bilateral optic nerve atrophy, however evaluation is limited on this nondedicated study. Minimal mucosal thickening is noted in the ethmoid air cells. Mastoid air cells are clear. Major intracranial vascular flow voids are preserved. IMPRESSION: 1. No acute intracranial abnormality. 2. Advanced multiple sclerosis without significant change from the prior MRI. 3. Unchanged 6 mm hemorrhagic subependymal cyst/nodule in the  left lateral ventricle. Electronically Signed   By: Logan Bores M.D.   On: 10/04/2015 15:15   Dg Chest Portable 1 View  10/04/2015  CLINICAL DATA:  Possible aspiration.  Vomiting. EXAM: PORTABLE CHEST 1 VIEW COMPARISON:  September 16, 2015. FINDINGS: Stable cardiomediastinal silhouette. Enlargement and tortuosity of descending thoracic aorta is noted which is stable. No pneumothorax or pleural effusion is noted. No acute pulmonary disease is noted. Bony thorax is intact. IMPRESSION: No acute cardiopulmonary abnormality seen. Electronically Signed   By: Marijo Conception, M.D.   On: 10/04/2015 09:42    Scheduled Meds: . cefTRIAXone  (ROCEPHIN)  IV  1 g Intravenous Q24H  . enoxaparin (LOVENOX) injection  40 mg Subcutaneous Q24H  . lamoTRIgine  100 mg Oral BID   Continuous Infusions: . sodium chloride 100 mL/hr at 10/05/15 0603  . sodium chloride Stopped (10/05/15 0602)    Active Problems:   Hypokalemia   Altered mental status   UTI (lower urinary tract infection)   Altered level of consciousness    Time spent: 35 min    Azell Bill, [facility] Pager [phone]. If 7PM-7AM, please contact night-coverage at [email or web address], password [facility] 10/05/2015, 7:39 AM  LOS: 1 day

## 2015-10-05 NOTE — Plan of Care (Signed)
Problem: Pain Managment: Goal: General experience of comfort will improve Outcome: Progressing Discussed pain with patient

## 2015-10-05 NOTE — Progress Notes (Addendum)
Pt c/o left eye burning/hurting. Left eye swollen. Paged Dr Coralyn Pear. Waiting to hear back from dr. Dr Coralyn Pear ordered to apply warm compresses. Will apply and continue to monitor pt. Bed remains in lowest position and call bell is within reach.

## 2015-10-05 NOTE — Progress Notes (Signed)
Swelling noted to be more now than earlier today. Paged Dr Coralyn Pear. Waiting to hear back from dr.

## 2015-10-06 ENCOUNTER — Inpatient Hospital Stay (HOSPITAL_COMMUNITY): Payer: Federal, State, Local not specified - PPO

## 2015-10-06 DIAGNOSIS — H5712 Ocular pain, left eye: Secondary | ICD-10-CM

## 2015-10-06 LAB — CBC
HCT: 35.4 % — ABNORMAL LOW (ref 36.0–46.0)
HEMATOCRIT: 36.4 % (ref 36.0–46.0)
HEMOGLOBIN: 12.2 g/dL (ref 12.0–15.0)
Hemoglobin: 11.8 g/dL — ABNORMAL LOW (ref 12.0–15.0)
MCH: 27.8 pg (ref 26.0–34.0)
MCH: 27.6 pg (ref 26.0–34.0)
MCHC: 33.3 g/dL (ref 30.0–36.0)
MCHC: 33.5 g/dL (ref 30.0–36.0)
MCV: 83.3 fL (ref 78.0–100.0)
MCV: 82.4 fL (ref 78.0–100.0)
PLATELETS: 160 10*3/uL (ref 150–400)
Platelets: 170 10*3/uL (ref 150–400)
RBC: 4.25 MIL/uL (ref 3.87–5.11)
RBC: 4.42 MIL/uL (ref 3.87–5.11)
RDW: 15.6 % — ABNORMAL HIGH (ref 11.5–15.5)
RDW: 15.4 % (ref 11.5–15.5)
WBC: 7 10*3/uL (ref 4.0–10.5)
WBC: 7.4 10*3/uL (ref 4.0–10.5)

## 2015-10-06 LAB — BASIC METABOLIC PANEL
Anion gap: 11 (ref 5–15)
BUN: <5 mg/dL — ABNORMAL LOW (ref 6–20)
CHLORIDE: 106 mmol/L (ref 101–111)
CO2: 25 mmol/L (ref 22–32)
CREATININE: 0.95 mg/dL (ref 0.44–1.00)
Calcium: 9.4 mg/dL (ref 8.9–10.3)
Glucose, Bld: 108 mg/dL — ABNORMAL HIGH (ref 65–99)
POTASSIUM: 2.9 mmol/L — AB (ref 3.5–5.1)
SODIUM: 142 mmol/L (ref 135–145)

## 2015-10-06 MED ORDER — POTASSIUM CHLORIDE 10 MEQ/100ML IV SOLN
10.0000 meq | INTRAVENOUS | Status: AC
  Administered 2015-10-06 (×2): 10 meq via INTRAVENOUS
  Filled 2015-10-06 (×2): qty 100

## 2015-10-06 MED ORDER — HYDRALAZINE HCL 20 MG/ML IJ SOLN
10.0000 mg | Freq: Four times a day (QID) | INTRAMUSCULAR | Status: DC | PRN
  Administered 2015-10-07 – 2015-10-08 (×3): 10 mg via INTRAVENOUS
  Filled 2015-10-06 (×3): qty 1

## 2015-10-06 MED ORDER — HALOPERIDOL LACTATE 5 MG/ML IJ SOLN
2.0000 mg | Freq: Once | INTRAMUSCULAR | Status: AC
  Administered 2015-10-06: 2 mg via INTRAVENOUS
  Filled 2015-10-06: qty 1

## 2015-10-06 MED ORDER — POTASSIUM CHLORIDE CRYS ER 20 MEQ PO TBCR
40.0000 meq | EXTENDED_RELEASE_TABLET | ORAL | Status: AC
  Administered 2015-10-06 (×2): 40 meq via ORAL
  Filled 2015-10-06: qty 2

## 2015-10-06 MED ORDER — MAGNESIUM SULFATE IN D5W 10-5 MG/ML-% IV SOLN
1.0000 g | Freq: Once | INTRAVENOUS | Status: AC
  Administered 2015-10-06: 1 g via INTRAVENOUS
  Filled 2015-10-06: qty 100

## 2015-10-06 MED ORDER — IOHEXOL 300 MG/ML  SOLN
75.0000 mL | Freq: Once | INTRAMUSCULAR | Status: AC | PRN
  Administered 2015-10-06: 75 mL via INTRAVENOUS

## 2015-10-06 MED ORDER — PIPERACILLIN-TAZOBACTAM 3.375 G IVPB
3.3750 g | Freq: Three times a day (TID) | INTRAVENOUS | Status: DC
  Administered 2015-10-06 – 2015-10-07 (×3): 3.375 g via INTRAVENOUS
  Filled 2015-10-06 (×5): qty 50

## 2015-10-06 MED ORDER — LORAZEPAM 2 MG/ML IJ SOLN
1.0000 mg | Freq: Once | INTRAMUSCULAR | Status: AC
  Administered 2015-10-06: 1 mg via INTRAVENOUS
  Filled 2015-10-06: qty 1

## 2015-10-06 NOTE — Consult Note (Signed)
Chief Complaint  Patient presents with  . Cerebrovascular Accident  :   HPI     Ophthalmology HPI:   CC: R/O Oribital Cellulitis LEFT EYE   This is a 65 y.o.  female with a past ocular history listed below that presents with nonspecific complaints. She states that her vision in the right eye is poor and has been poor for some time. She states her left eye is tender. She appears to be photophobic. She states her left eye is a little scratchy. She is poorly responsive on exam and does not give a clear answer to questions asked. She is oriented to person and place. She states her right eye has been bad "for years"      Past Ocular History: Multiple Sclerosis, Unknown if Optic neuropathy Macular Degeneration "Right Eye"    Last Eye Exam: "1 month ago"    Primary Eye Care:  Dr. Krista Blue    Past Medical History  Diagnosis Date  . Elevated cholesterol   . Hypertension   . Arthritis   . Cervical dysplasia   . Depression   . Anxiety   . Macular degeneration of right eye 2001  . MS (multiple sclerosis) ([place]) 08/2013  . Acute encephalopathy 03/12/2015     Past Surgical History  Procedure Laterality Date  . Colposcopy    . Knee surgery Bilateral     "had cortisone injections in my knees"  . Cholecystectomy    . Tubal ligation    . Tonsillectomy    . Colonoscopy w/ polypectomy       Social History   Social History  . Marital Status: Widowed    Spouse Name: N/A  . Number of Children: 2  . Years of Education: college   Occupational History  . retired     Social History Main Topics  . Smoking status: Former Smoker -- 1.00 packs/day for 45 years    Types: Cigarettes    Quit date: 10/23/2013  . Smokeless tobacco: Never Used     Comment: Quit smoking in 2014.  Marland Kitchen Alcohol Use: 0.0 oz/week    0 Standard drinks or equivalent per week     Comment: hasn't drank anything since jan 2015  . Drug Use: No  . Sexual Activity: No   Other Topics Concern  . Not on file    Social History Narrative   Grew up in [place] area, 1 of 10 siblings - 18 still living, married for 33 years then divorced, moved to [place] to be near daughter post retirement, als has 1 son. 2 dogs.       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      Allergies  Allergen Reactions  . Baclofen     Confusion, muscle weakness, lethargy     No current facility-administered medications on file prior to encounter.   Current Outpatient Prescriptions on File Prior to Encounter  Medication Sig Dispense Refill  . baclofen (LIORESAL) 10 MG tablet Take 10 mg by mouth at bedtime.    . Cholecalciferol (VITAMIN D3) 2000 UNITS capsule Take 2,000 Units by mouth daily.    . clonazePAM (KLONOPIN) 1 MG tablet Take 1 mg by mouth daily as needed. Anxiety/sleep  0  . ibuprofen (ADVIL,MOTRIN) 200 MG tablet Take 800 mg by mouth 3 (three) times daily as needed for headache.    . Investigational - Study Medication Take 80 mg by mouth at bedtime. Additional Study Details:Patient is  on a study drug with NEURO- Baclofen ER 40 MG, takes 80 mg at night    . LamoTRIgine (LAMICTAL XR) 100 MG TB24 2 tabs po qhs (Patient taking differently: Take 200 mg by mouth at bedtime. ) 60 tablet 11  . meloxicam (MOBIC) 15 MG tablet take 1 tablet by mouth once daily (Patient taking differently: take 1 tablet by mouth  daily) 90 tablet 3  . modafinil (PROVIGIL) 200 MG tablet Take 1 tablet (200 mg total) by mouth daily. 30 tablet 5  . natalizumab (TYSABRI) 300 MG/15ML injection Inject 300 mg into the vein every 30 (thirty) days.        ROS    Exam:  General: Awake, and oriented to person and place She is inconsistent in ansers. She has restraints on her right hand.   Vision (near): without correction     OD: 26 point   OS:  HM (Poor compliance)   Confrontational Field:   Full to count fingers OD, Responds to stimuli in four quadrants OS.   Extraocular Motility:  Full ductions OD,  Possible  limitation in up gaze OS,  [place]:   Unable to complete.   External:   Erythema and lid edema OS.    V1-3 intact, infraorbital nerve appears intact.   Hertel:   Unable due to patient compliance.   Pupils  OD: 20mm to 57mm reactive without afferent pupillary defect (APD)  OS:4.5 to 3.5 mm minimally reactive without afferent pupillary defect (APD)    Pen lgiht  Exam:  Lids/Lashes  OD: Normal Lids and lashes, No lesion or injury  OS: Normal lids and lashes, nor lesion or injury  Conjucntiva/Sclera  OD: White and quiet  OS: 3+ Chemosis, 2+ Injection  Cornea  OD: Clear without abrasion or defect  OS: Mild Haze, No infiltrate,   Anterior Chamber  OD: Deep and quiet  OS: Deep. Unable to assess for cell  Iris  OD: Normal iris architecture  OS: Normal Iris Architecture   Lens  OD:2+ Hurley   OS: 2+ NSC    POSTERIOR POLE EXAM - Unable to complete due to patient refusal. Discussed that she could go blind without a complete exam. Patient again refused. Will attempt at a later time.     Assessment and Plan:   This is 65 y.o.  female with chemosis, mild proptosis, and minimal motility disturbance.  Patient with altered mental status and did not allow me to complete my exam. It does appear that she has orbital cellulitis.   ORBITAL CELLULITIS Recommend:  - CT Orbits w/ Contrast (Ordered/pending results) - IV Vancomycin and meropenem,  - Continue Lacrilube and Emycin ointment.   Will continue to follow.  Please do not hesitate to call me with new or worsening symptoms.    Julian Reil

## 2015-10-06 NOTE — Progress Notes (Signed)
Subjective: Mental status has improved. Patient had no new complaints other than pain involving her left eye.  Objective: Current vital signs: BP 135/70 mmHg  Pulse 88  Temp(Src) 99.5 F (37.5 C) (Oral)  Resp 18  Ht 5' (1.524 m)  Wt 54.5 kg (120 lb 2.4 oz)  BMI 23.47 kg/m2  SpO2 95%  Neurologic Exam: Patient was alert and in no acute distress. She was well-oriented to time as well as place. Swelling and conjunctivitis as well as somewhat purulent appearing discharge noted involving left eye. Face was asymmetrical with mild right lower facial weakness. Mild to moderate right upper and lower extremity weakness noted (old and likely back to baseline).  Medications: I have reviewed the patient's current medications.  Assessment/Plan: 65 year old lady with history of multiple sclerosis and right hemiparesis admitted with obtundation and apparent worsening of right-sided weakness which was most likely due to patient's acute infectious process he had MRI showed no acute stroke. There are no indications of an acute MS relapse as well. Patient has markedly improved and may well be back to baseline including residual right-sided weakness and mental status.  No further acute neurological intervention is indicated at this point. We will see this patient in follow-up on an as-needed basis following this visit.  C.R. Nicole Kindred, MD [facility] [phone]  10/06/2015  9:42 AM

## 2015-10-06 NOTE — Evaluation (Signed)
Physical Therapy Evaluation Patient Details Name: NATURELLE NEVIUS MRN: [id number] DOB: 19-Dec-1950 Today's Date: 10/06/2015   History of Present Illness  pt presents with Encephalopathy, UTI, and Periorbital Cellulitis.  pt with hx of MS, R Macular Degeneration, HTN, Depression, and Anxiety.  Clinical Impression  Pt generally restless and trying to wiggle OOB, but becomes agitated at lines while attempting to A pt with coming to sitting and begins to push PT away.  Pt confused throughout session and gets angry with attempts to orient and just wants PT to get scissors out of "that cupboard" (the window) to cut her mitt off her R hand.  Unclear amount of A daughter can provide at home and pt's baseline level of cognition.  Feel pt will need SNF level of care unless confusion clears and daughter is able to manage pt at home.  Will continue to follow.      Follow Up Recommendations SNF    Equipment Recommendations  None recommended by PT    Recommendations for Other Services       Precautions / Restrictions Precautions Precautions: Fall Restrictions Weight Bearing Restrictions: No      Mobility  Bed Mobility Overal bed mobility: Needs Assistance             General bed mobility comments: pt brought to long sitting in bed and was about to A pt to sitting EOB when she became agitated by lines and started pushing PT.  pt returned to supine in bed.    Transfers                    Ambulation/Gait                Stairs            Wheelchair Mobility    Modified Rankin (Stroke Patients Only)       Balance Overall balance assessment: Needs assistance;History of Falls Sitting-balance support: Bilateral upper extremity supported;Feet supported Sitting balance-Leahy Scale: Poor Sitting balance - Comments: Restlessness limits ability to balance.                                       Pertinent Vitals/Pain Pain Assessment: Faces Faces Pain  Scale: No hurt    Home Living Family/patient expects to be discharged to:: Unsure                 Additional Comments: Per chart lives with a daughter, but pt not able to answer questions on eval.      Prior Function Level of Independence: Needs assistance   Gait / Transfers Assistance Needed: walks with RW independently, daughter assists with getting RLE into bed as needed  ADL's / Homemaking Assistance Needed: daughter assists with bathing and dressing; uses BSC in shower  Comments: PLOF info taken from PT eval on 09/17/15     Hand Dominance        Extremity/Trunk Assessment   Upper Extremity Assessment: Difficult to assess due to impaired cognition           Lower Extremity Assessment: Difficult to assess due to impaired cognition (pt moving Bil LEs, but not to command.)         Communication   Communication: No difficulties  Cognition Arousal/Alertness: Awake/alert Behavior During Therapy: Restless;Anxious;Impulsive Overall Cognitive Status: No family/caregiver present to determine baseline cognitive functioning Area of Impairment: Orientation;Attention;Memory;Following commands;Safety/judgement;Awareness;Problem solving Orientation Level:  Disoriented to;Place;Time;Situation Current Attention Level: Focused Memory: Decreased recall of precautions;Decreased short-term memory Following Commands: Follows one step commands inconsistently Safety/Judgement: Decreased awareness of safety;Decreased awareness of deficits Awareness: Intellectual Problem Solving: Slow processing;Difficulty sequencing;Requires verbal cues;Requires tactile cues General Comments: pt thought she was at home and became angry with attempt to orient her to location stating "This isn't [place].  I know what [facility] looks like, and this ain't it."    General Comments      Exercises        Assessment/Plan    PT Assessment Patient needs continued PT services  PT Diagnosis  Difficulty walking;Generalized weakness;Altered mental status   PT Problem List Decreased strength;Decreased activity tolerance;Decreased balance;Decreased mobility;Decreased coordination;Decreased cognition;Decreased knowledge of use of DME;Decreased safety awareness  PT Treatment Interventions DME instruction;Gait training;Functional mobility training;Therapeutic activities;Stair training;Therapeutic exercise;Balance training;Neuromuscular re-education;Cognitive remediation;Patient/family education   PT Goals (Current goals can be found in the Care Plan section) Acute Rehab PT Goals Patient Stated Goal: pt not stating PT Goal Formulation: Patient unable to participate in goal setting Time For Goal Achievement: 10/20/15 Potential to Achieve Goals: Fair    Frequency Min 3X/week   Barriers to discharge        Co-evaluation               End of Session   Activity Tolerance: Treatment limited secondary to agitation Patient left: in bed;with call bell/phone within reach;with bed alarm set Nurse Communication: Mobility status;Other (comment) (Agitation)         Time: [id number] PT Time Calculation (min) (ACUTE ONLY): 10 min   Charges:   PT Evaluation $Initial PT Evaluation Tier I: 1 Procedure     PT G CodesCatarina Hartshorn, [place] 10/06/2015, 2:36 PM

## 2015-10-06 NOTE — Progress Notes (Signed)
ANTIBIOTIC CONSULT NOTE - INITIAL  Pharmacy Consult for vancomycin / zosyn  Indication: periorbital cellulitis / UTI  Allergies  Allergen Reactions  . Baclofen     Confusion, muscle weakness, lethargy    Patient Measurements: Height: 5' (152.4 cm) Weight: 120 lb 2.4 oz (54.5 kg) IBW/kg (Calculated) : 45.5  Vital Signs: Temp: 99.5 F (37.5 C) (01/01 0527) Temp Source: Oral (01/01 0527) BP: 135/70 mmHg (01/01 0527) Pulse Rate: 88 (01/01 0527) Intake/Output from previous day: 12/31 0701 - 01/01 0700 In: 1428.3 [P.O.:120; I.V.:1058.3; IV Piggyback:250] Out: 3300 [Urine:3300] Intake/Output from this shift: Total I/O In: -  Out: 1200 [Urine:1200]  Labs:  Recent Labs  10/04/15 1725 10/05/15 0447 10/06/15 0420 10/06/15 0845  WBC 6.8 6.8 7.0 7.4  HGB 10.9* 11.0* 11.8* 12.2  PLT 156 159 160 170  CREATININE 0.84 0.86 0.95  --    Estimated Creatinine Clearance: 43 mL/min (by C-G formula based on Cr of 0.95). No results for input(s): VANCOTROUGH, VANCOPEAK, VANCORANDOM, GENTTROUGH, GENTPEAK, GENTRANDOM, TOBRATROUGH, TOBRAPEAK, TOBRARND, AMIKACINPEAK, AMIKACINTROU, AMIKACIN in the last 72 hours.   Microbiology: Recent Results (from the past 720 hour(s))  Urine culture     Status: None   Collection Time: 09/16/15  1:30 PM  Result Value Ref Range Status   Specimen Description URINE, CLEAN CATCH  Final   Special Requests NONE  Final   Culture   Final    >=100,000 COLONIES/mL ESCHERICHIA COLI Performed at [facility]    Report Status 09/18/2015 FINAL  Final   Organism ID, Bacteria ESCHERICHIA COLI  Final      Susceptibility   Escherichia coli - MIC*    AMPICILLIN >=32 RESISTANT Resistant     CEFAZOLIN <=4 SENSITIVE Sensitive     CEFTRIAXONE <=1 SENSITIVE Sensitive     CIPROFLOXACIN <=0.25 SENSITIVE Sensitive     GENTAMICIN <=1 SENSITIVE Sensitive     IMIPENEM <=0.25 SENSITIVE Sensitive     NITROFURANTOIN <=16 SENSITIVE Sensitive     TRIMETH/SULFA >=320  RESISTANT Resistant     AMPICILLIN/SULBACTAM 16 INTERMEDIATE Intermediate     PIP/TAZO <=4 SENSITIVE Sensitive     * >=100,000 COLONIES/mL ESCHERICHIA COLI  Urine culture     Status: None   Collection Time: 10/04/15 10:20 AM  Result Value Ref Range Status   Specimen Description URINE, CATHETERIZED  Final   Special Requests NONE  Final   Culture NO GROWTH 1 DAY  Final   Report Status 10/05/2015 FINAL  Final  Culture, blood (Routine X 2) w Reflex to ID Panel     Status: None (Preliminary result)   Collection Time: 10/04/15  5:25 PM  Result Value Ref Range Status   Specimen Description BLOOD LEFT ANTECUBITAL  Final   Special Requests BOTTLES DRAWN AEROBIC AND ANAEROBIC 5CC  Final   Culture NO GROWTH < 24 HOURS  Final   Report Status PENDING  Incomplete  Culture, blood (Routine X 2) w Reflex to ID Panel     Status: None (Preliminary result)   Collection Time: 10/04/15  5:30 PM  Result Value Ref Range Status   Specimen Description BLOOD LEFT HAND  Final   Special Requests IN PEDIATRIC BOTTLE 0.5CC  Final   Culture NO GROWTH < 24 HOURS  Final   Report Status PENDING  Incomplete    Medical History: Past Medical History  Diagnosis Date  . Elevated cholesterol   . Hypertension   . Arthritis   . Cervical dysplasia   . Depression   .  Anxiety   . Macular degeneration of right eye 2001  . MS (multiple sclerosis) ([place]) 08/2013  . Acute encephalopathy 03/12/2015   Assessment: 65 yo F presents on 12/30 with AMS. Started on Rocephin for possible UTI. Now has swelling and inflammation of her upper eyelid.  WBC 7.4, afeb, Scr 0.86>0.95, CrCl ~43 ml/min    Vanc 12/31>> Zosyn 1/1>> Ceftriaxone 12/30>12/30  12/30 UC NG1D 12/30 BC NG24H   Goal of Therapy:  Resolution of infection Vancomycin trough 10-15 mcg/mL  Plan:  vancomycin 1g IV Q24 Zosyn 3.375 g q8h  Monitor clinical picture, renal function, VT prn F/U C&S, abx deescalation / [place], PharmD Pharmacy  Resident [phone] 10/06/2015 11:11 AM

## 2015-10-06 NOTE — Progress Notes (Signed)
Patient Demographics:    Wanda [name], is a 65 y.o. female, DOB - 08-12-1951, [id number]  Admit date - 10/04/2015   Admitting Physician Oswald Hillock, MD  Outpatient Primary MD for the patient is JEFFERY,[name], PA-C  LOS - 2   Chief Complaint  Patient presents with  . Cerebrovascular Accident        Subjective:    Wanda [name] today has, No headache, No chest pain, No abdominal pain - No Nausea, No new weakness tingling or numbness, No Cough - SOB. Is having left eye pain.   Assessment  & Plan :     1. Acute encephalopathy likely due to left sided periorbital cellulitis also she did have UTI - cultures pending, placed on vancomycin along with Zosyn. Ophthalmology on board. Getting CT scan of the orbits. Continue to monitor. Currently not toxic looking. According to the neurologist who saw the patient on admission her encephalopathy actually has improved.   2. History of MS with generalized weakness. Supportive care and no acute flare. MRI brain nonacute. Unchanged 6 mm hemorrhagic subependymal cyst/nodule , outpatient follow-up with primary neurologist and PCP.   3. HTN - as needed hydralazine.    Code Status : Full  Family Communication  : None   Disposition Plan  : Likely SNF  Consults  :   Opthalmology Neuro  Procedures  :   MRI brain nonacute. Unchanged 6 mm hemorrhagic subependymal cyst/nodule   MRI C-spine ordered    DVT Prophylaxis  :  Lovenox   Lab Results  Component Value Date   PLT 170 10/06/2015    Inpatient Medications  Scheduled Meds: . cefTRIAXone (ROCEPHIN)  IV  1 g Intravenous Q24H  . enoxaparin (LOVENOX) injection  40 mg Subcutaneous Q24H  . erythromycin   Left Eye QHS  . haloperidol lactate  2 mg Intravenous Once  . lamoTRIgine  100 mg Oral BID   . LORazepam  1 mg Intravenous Once  . potassium chloride  40 mEq Oral Q4H  . vancomycin  1,000 mg Intravenous Q24H   Continuous Infusions: . sodium chloride 100 mL/hr at 10/06/15 0556   PRN Meds:.artificial tears, ondansetron **OR** ondansetron (ZOFRAN) IV  Antibiotics  :     Anti-infectives    Start     Dose/Rate Route Frequency Ordered Stop   10/05/15 1915  vancomycin (VANCOCIN) IVPB 1000 mg/200 mL premix     1,000 mg 200 mL/hr over 60 Minutes Intravenous Every 24 hours 10/05/15 1908     10/04/15 1800  cefTRIAXone (ROCEPHIN) 1 g in dextrose 5 % 50 mL IVPB     1 g 100 mL/hr over 30 Minutes Intravenous Every 24 hours 10/04/15 1646     10/04/15 1245  cefTRIAXone (ROCEPHIN) 2 g in dextrose 5 % 50 mL IVPB     2 g 100 mL/hr over 30 Minutes Intravenous  Once 10/04/15 1236 10/04/15 1456        Objective:   Filed Vitals:   10/05/15 0905 10/05/15 1503 10/05/15 2121 10/06/15 0527  BP: 159/82 152/92 159/94 135/70  Pulse: 76 91 90 88  Temp: 98.4 F (36.9 C)  99.2 F (37.3 C) 99.5 F (37.5 C)  TempSrc: Oral  Oral Oral  Resp: 19 19 18  18  Height:      Weight:      SpO2: 100% 98% 98% 95%    Wt Readings from Last 3 Encounters:  10/04/15 54.5 kg (120 lb 2.4 oz)  09/25/15 57.607 kg (127 lb)  08/27/15 55.792 kg (123 lb)     Intake/Output Summary (Last 24 hours) at 10/06/15 1050 Last data filed at 10/06/15 1034  Gross per 24 hour  Intake 1428.33 ml  Output   3150 ml  Net -1721.67 ml     Physical Exam  Awake but mildly confused, No new F.N deficits, chronic generalized weakness due to MS, Normal affect [place].AT, left thigh has mild proptosis and conjunctival swelling, conjunctiva is red, there is thick secretion with matting of the eyelids Supple Neck,No JVD, No cervical lymphadenopathy appriciated.  Symmetrical Chest wall movement, Good air movement bilaterally, CTAB RRR,No Gallops,Rubs or new Murmurs, No Parasternal Heave +ve B.Sounds, Abd Soft, No tenderness, No  organomegaly appriciated, No rebound - guarding or rigidity. No Cyanosis, Clubbing or edema, No new Rash or bruise       Data Review:   Micro Results Recent Results (from the past 240 hour(s))  Urine culture     Status: None   Collection Time: 10/04/15 10:20 AM  Result Value Ref Range Status   Specimen Description URINE, CATHETERIZED  Final   Special Requests NONE  Final   Culture NO GROWTH 1 DAY  Final   Report Status 10/05/2015 FINAL  Final  Culture, blood (Routine X 2) w Reflex to ID Panel     Status: None (Preliminary result)   Collection Time: 10/04/15  5:25 PM  Result Value Ref Range Status   Specimen Description BLOOD LEFT ANTECUBITAL  Final   Special Requests BOTTLES DRAWN AEROBIC AND ANAEROBIC 5CC  Final   Culture NO GROWTH < 24 HOURS  Final   Report Status PENDING  Incomplete  Culture, blood (Routine X 2) w Reflex to ID Panel     Status: None (Preliminary result)   Collection Time: 10/04/15  5:30 PM  Result Value Ref Range Status   Specimen Description BLOOD LEFT HAND  Final   Special Requests IN PEDIATRIC BOTTLE 0.5CC  Final   Culture NO GROWTH < 24 HOURS  Final   Report Status PENDING  Incomplete    Radiology Reports Dg Chest 2 View  09/16/2015  CLINICAL DATA:  Confusion and altered mental status. EXAM: CHEST  2 VIEW COMPARISON:  07/29/2015 and 03/12/2015 FINDINGS: Prominent linear density at the left lung base is suggestive for some atelectasis. Otherwise, the lungs are clear. Stable appearance of the heart and mediastinum. The thoracic aorta appears tortuous and similar to the prior examinations. No pleural effusions. Mild degenerative changes in the thoracic spine. IMPRESSION: Probable left basilar atelectasis. Otherwise, no acute chest findings. Electronically Signed   By: Markus Daft M.D.   On: 09/16/2015 12:57   Ct Head Wo Contrast  10/04/2015  CLINICAL DATA:  Nonverbal and combative. EXAM: CT HEAD WITHOUT CONTRAST TECHNIQUE: Contiguous axial images were  obtained from the base of the skull through the vertex without intravenous contrast. COMPARISON:  Head CT dated 09/16/2015 and of brain MRI dated 07/29/2015. FINDINGS: There is mild generalized brain atrophy with commensurate dilatation of the ventricles and sulci. Chronic small vessel ischemic changes again noted within the bilateral periventricular and subcortical white matter regions. There is no mass, hemorrhage, edema or other evidence of acute parenchymal abnormality. No extra-axial hemorrhage. There are chronic calcified atherosclerotic changes of the large vessels  at the skull base. No osseous abnormality seen. Paranasal sinuses and mastoid air cells are clear. Superficial soft tissues are unremarkable. IMPRESSION: 1. Atrophy and chronic ischemic changes in the white matter. 2. No acute findings.  No intracranial mass, hemorrhage or edema. Electronically Signed   By: Franki Cabot M.D.   On: 10/04/2015 10:41   Ct Head Wo Contrast  09/16/2015  CLINICAL DATA:  Confusion, altered mental status. EXAM: CT HEAD WITHOUT CONTRAST TECHNIQUE: Contiguous axial images were obtained from the base of the skull through the vertex without intravenous contrast. COMPARISON:  07/29/2015 FINDINGS: Mild chronic small vessel disease changes throughout the deep white matter. No acute intracranial abnormality. Specifically, no hemorrhage, hydrocephalus, mass lesion, acute infarction, or significant intracranial injury. No acute calvarial abnormality. Visualized paranasal sinuses and mastoids clear. Orbital soft tissues unremarkable. IMPRESSION: No acute intracranial abnormality. Chronic small vessel disease, stable. Electronically Signed   By: Rolm Baptise M.D.   On: 09/16/2015 13:16   Mr Brain Wo Contrast  10/04/2015  CLINICAL DATA:  Right arm and leg weakness. Altered mental status. Nonverbal. Vomiting. Fall yesterday. History of multiple sclerosis. EXAM: MRI HEAD WITHOUT CONTRAST TECHNIQUE: Multiplanar, multiecho pulse  sequences of the brain and surrounding structures were obtained without intravenous contrast. COMPARISON:  Head CT 10/04/2015 and MRI 07/29/2015 FINDINGS: Mild diffusion weighted signal hyperintensity in the left greater than right corona radiata and internal capsules is similar to the prior MRI and and without restricted diffusion. No restricted diffusion is seen elsewhere to indicate acute infarct. 6 mm subependymal lesion with blood products and fluid fluid level in the atrium of the left lateral ventricle is unchanged in size although demonstrates less diffusion-weighted signal abnormality than on the prior MRI. Confluent T2 hyperintensities in the subcortical and deep cerebral white matter including prominent periventricular involvement as well as patchy T2 hyperintensity in the pons are unchanged. Multiple black holes are noted. There is prominent atrophy of the corpus callosum. No cerebellar lesions are identified. There is moderate cerebral atrophy. There may be mild bilateral optic nerve atrophy, however evaluation is limited on this nondedicated study. Minimal mucosal thickening is noted in the ethmoid air cells. Mastoid air cells are clear. Major intracranial vascular flow voids are preserved. IMPRESSION: 1. No acute intracranial abnormality. 2. Advanced multiple sclerosis without significant change from the prior MRI. 3. Unchanged 6 mm hemorrhagic subependymal cyst/nodule in the left lateral ventricle. Electronically Signed   By: Logan Bores M.D.   On: 10/04/2015 15:15   Dg Chest Portable 1 View  10/04/2015  CLINICAL DATA:  Possible aspiration.  Vomiting. EXAM: PORTABLE CHEST 1 VIEW COMPARISON:  September 16, 2015. FINDINGS: Stable cardiomediastinal silhouette. Enlargement and tortuosity of descending thoracic aorta is noted which is stable. No pneumothorax or pleural effusion is noted. No acute pulmonary disease is noted. Bony thorax is intact. IMPRESSION: No acute cardiopulmonary abnormality seen.  Electronically Signed   By: Marijo Conception, M.D.   On: 10/04/2015 09:42     CBC  Recent Labs Lab 10/02/15 2010 10/04/15 0914 10/04/15 0922 10/04/15 1725 10/05/15 0447 10/06/15 0420 10/06/15 0845  WBC 5.0 5.3  --  6.8 6.8 7.0 7.4  HGB 10.9* 11.8* 12.9 10.9* 11.0* 11.8* 12.2  HCT 33.8* 36.6 38.0 33.7* 33.1* 35.4* 36.4  PLT 150 167  --  156 159 160 170  MCV 83.9 84.7  --  86.0 84.0 83.3 82.4  MCH 27.0 27.3  --  27.8 27.9 27.8 27.6  MCHC 32.2 32.2  --  32.3  33.2 33.3 33.5  RDW 15.8* 16.2*  --  16.6* 16.2* 15.6* 15.4  LYMPHSABS 2.1 3.3  --   --   --   --   --   MONOABS 0.3 0.2  --   --   --   --   --   EOSABS 0.2 0.2  --   --   --   --   --   BASOSABS 0.0 0.0  --   --   --   --   --     Chemistries   Recent Labs Lab 10/02/15 2010 10/04/15 0914 10/04/15 0922 10/04/15 1725 10/05/15 0447 10/06/15 0420  NA 147* 145 149*  --  148* 142  K 3.1* 3.2* 3.2*  --  3.5 2.9*  CL 115* 115* 113*  --  115* 106  CO2 23 20*  --   --  23 25  GLUCOSE 152* 161* 154*  --  92 108*  BUN 12 10 10   --  <5* <5*  CREATININE 0.99 1.07* 1.00 0.84 0.86 0.95  CALCIUM 9.1 9.0  --   --  9.0 9.4  AST 16 14*  --   --  21  --   ALT 16 15  --   --  16  --   ALKPHOS 73 73  --   --  71  --   BILITOT 0.4 0.4  --   --  0.4  --    ------------------------------------------------------------------------------------------------------------------ estimated creatinine clearance is 43 mL/min (by C-G formula based on Cr of 0.95). ------------------------------------------------------------------------------------------------------------------ No results for input(s): HGBA1C in the last 72 hours. ------------------------------------------------------------------------------------------------------------------ No results for input(s): CHOL, HDL, LDLCALC, TRIG, CHOLHDL, LDLDIRECT in the last 72  hours. ------------------------------------------------------------------------------------------------------------------  Recent Labs  10/04/15 1040  TSH 0.456   ------------------------------------------------------------------------------------------------------------------ No results for input(s): VITAMINB12, FOLATE, FERRITIN, TIBC, IRON, RETICCTPCT in the last 72 hours.  Coagulation profile  Recent Labs Lab 10/04/15 0914  INR 1.08    No results for input(s): DDIMER in the last 72 hours.  Cardiac Enzymes  Recent Labs Lab 10/04/15 1725 10/04/15 2221 10/05/15 0447  TROPONINI <0.03 <0.03 <0.03   ------------------------------------------------------------------------------------------------------------------ Invalid input(s): POCBNP   Time Spent in minutes 35   SINGH,PRASHANT K M.D on 10/06/2015 at 10:50 AM  Between 7am to 7pm - Pager - [phone]  After 7pm go to [email or web address] - password [facility]  [facility] -  Office  [phone]

## 2015-10-07 DIAGNOSIS — H269 Unspecified cataract: Secondary | ICD-10-CM | POA: Insufficient documentation

## 2015-10-07 LAB — BASIC METABOLIC PANEL
ANION GAP: 12 (ref 5–15)
BUN: <5 mg/dL — ABNORMAL LOW (ref 6–20)
CALCIUM: 9.6 mg/dL (ref 8.9–10.3)
CO2: 25 mmol/L (ref 22–32)
CREATININE: 0.93 mg/dL (ref 0.44–1.00)
Chloride: 106 mmol/L (ref 101–111)
GFR calc Af Amer: >60 mL/min (ref >60)
GLUCOSE: 109 mg/dL — AB (ref 65–99)
Potassium: 3.3 mmol/L — ABNORMAL LOW (ref 3.5–5.1)
Sodium: 143 mmol/L (ref 135–145)

## 2015-10-07 LAB — MAGNESIUM: MAGNESIUM: 2.4 mg/dL (ref 1.7–2.4)

## 2015-10-07 LAB — CBC
HCT: 38.7 % (ref 36.0–46.0)
HEMOGLOBIN: 12.7 g/dL (ref 12.0–15.0)
MCH: 27.1 pg (ref 26.0–34.0)
MCHC: 32.8 g/dL (ref 30.0–36.0)
MCV: 82.7 fL (ref 78.0–100.0)
PLATELETS: 176 10*3/uL (ref 150–400)
RBC: 4.68 MIL/uL (ref 3.87–5.11)
RDW: 15.6 % — AB (ref 11.5–15.5)
WBC: 6.4 10*3/uL (ref 4.0–10.5)

## 2015-10-07 MED ORDER — PREDNISOLONE ACETATE 1 % OP SUSP
1.0000 [drp] | Freq: Four times a day (QID) | OPHTHALMIC | Status: DC
  Administered 2015-10-07 – 2015-10-08 (×5): 1 [drp] via OPHTHALMIC
  Filled 2015-10-07 (×2): qty 1

## 2015-10-07 MED ORDER — CLINDAMYCIN HCL 300 MG PO CAPS
300.0000 mg | ORAL_CAPSULE | Freq: Four times a day (QID) | ORAL | Status: DC
  Administered 2015-10-07 – 2015-10-08 (×5): 300 mg via ORAL
  Filled 2015-10-07 (×5): qty 1

## 2015-10-07 MED ORDER — DOXYCYCLINE HYCLATE 100 MG PO TABS: 100.0000 mg | ORAL_TABLET | Freq: Two times a day (BID) | ORAL | Status: DC

## 2015-10-07 MED ORDER — SACCHAROMYCES BOULARDII 250 MG PO CAPS
250.0000 mg | ORAL_CAPSULE | Freq: Two times a day (BID) | ORAL | Status: DC
Start: 2015-10-07 — End: 2015-10-08
  Administered 2015-10-07 – 2015-10-08 (×3): 250 mg via ORAL
  Filled 2015-10-07 (×3): qty 1

## 2015-10-07 MED ORDER — GATIFLOXACIN 0.5 % OP SOLN
1.0000 [drp] | Freq: Four times a day (QID) | OPHTHALMIC | Status: DC
  Administered 2015-10-07 – 2015-10-08 (×5): 1 [drp] via OPHTHALMIC
  Filled 2015-10-07 (×2): qty 2.5

## 2015-10-07 MED ORDER — POTASSIUM CHLORIDE CRYS ER 20 MEQ PO TBCR
40.0000 meq | EXTENDED_RELEASE_TABLET | Freq: Once | ORAL | Status: AC
  Administered 2015-10-07: 40 meq via ORAL
  Filled 2015-10-07: qty 2

## 2015-10-07 MED ORDER — DEXTROSE 5 % IV SOLN
1.0000 g | INTRAVENOUS | Status: AC
  Administered 2015-10-07 – 2015-10-08 (×2): 1 g via INTRAVENOUS
  Filled 2015-10-07 (×2): qty 10

## 2015-10-07 NOTE — Consult Note (Signed)
OPHTHALMOLOGY FOLLOW UP Note S:  Still having irritation and discomfort. FBS.  Swelling about the same  O:  Ext: Stable erythema, and lid edema PF: 8/6 LF: 14/13 PenLight Exam LL: Edema OS C/S: 3+Injection, Trace Chemosis.  K: Trace Peripheral infiltrate AC: Appears deep and quiet I: unremarkable  Lens: 2+NSC   CT: Pre-septal edema without orbital inflammation  A/P:  No CT evidence of Orbital Cellulitis. Preseptal cellulitis with likley epidemic keratoconjunctivitis.   Recommend - Transition to PO Abx for total 7 days - Stop Erythromycin - Start Vigamox QID Left Eye - Start Prednisolone Acetate TID Left Eye - Culture- aerobic w/ Chlamydia and gonnorrhea.   Will continue to follow.    Please do not hesitate to call for new or worsening symptoms Julian Reil

## 2015-10-07 NOTE — Progress Notes (Signed)
Patient Demographics:    Wanda [name], is a 65 y.o. female, DOB - 1951/02/19, [id number]  Admit date - 10/04/2015   Admitting Physician Oswald Hillock, MD  Outpatient Primary MD for the patient is JEFFERY,[name], PA-C  LOS - 3   Chief Complaint  Patient presents with  . Cerebrovascular Accident        Subjective:    Wanda [name] today has, No headache, No chest pain, No abdominal pain - No Nausea, No new weakness tingling or numbness, No Cough - SOB. Is having left eye pain.   Assessment  & Plan :     1. Acute encephalopathy likely due to left conjunctivitis and UTI - cultures pending, was placed on vancomycin along with Zosyn. Ophthalmology on board. CT of the orbits reviewed, discussed with ophthalmology again on 10/06/2014, tapered down antibiotics to gatifloxacin eyedrops per ophthalmology, by mouth Clinda for eye infection in case she has bacterial component to her conjunctivitis.   Continue on Rocephin for UTI stop date 10/09/2015.   She was also seen by neurology upon admission, per neurologist she was back to her baseline with much improved encephalopathy on 10/06/2015.   2. History of MS with generalized weakness. Supportive care and no acute flare. MRI brain nonacute. Unchanged 6 mm hemorrhagic subependymal cyst/nodule , outpatient follow-up with primary neurologist and PCP.   3. HTN - as needed hydralazine.    Code Status : Full  Family Communication  : None   Disposition Plan  : Likely SNF  Consults  :   Opthalmology Neuro  Procedures  :   MRI brain nonacute. Unchanged 6 mm hemorrhagic subependymal cyst/nodule   CT Orbits-  L.Eye preseptal inflammation  DVT Prophylaxis  :  Lovenox   Lab Results  Component Value Date   PLT 176 10/07/2015    Inpatient  Medications  Scheduled Meds: . enoxaparin (LOVENOX) injection  40 mg Subcutaneous Q24H  . gatifloxacin  1 drop Left Eye QID  . lamoTRIgine  100 mg Oral BID  . piperacillin-tazobactam (ZOSYN)  IV  3.375 g Intravenous Q8H  . potassium chloride  40 mEq Oral Once  . prednisoLONE acetate  1 drop Left Eye QID  . vancomycin  1,000 mg Intravenous Q24H   Continuous Infusions: . sodium chloride 100 mL/hr at 10/07/15 0324   PRN Meds:.artificial tears, hydrALAZINE, ondansetron **OR** ondansetron (ZOFRAN) IV  Antibiotics  :     Anti-infectives    Start     Dose/Rate Route Frequency Ordered Stop   10/06/15 1130  piperacillin-tazobactam (ZOSYN) IVPB 3.375 g     3.375 g 12.5 mL/hr over 240 Minutes Intravenous Every 8 hours 10/06/15 1122     10/05/15 1915  vancomycin (VANCOCIN) IVPB 1000 mg/200 mL premix     1,000 mg 200 mL/hr over 60 Minutes Intravenous Every 24 hours 10/05/15 1908     10/04/15 1800  cefTRIAXone (ROCEPHIN) 1 g in dextrose 5 % 50 mL IVPB  Status:  Discontinued     1 g 100 mL/hr over 30 Minutes Intravenous Every 24 hours 10/04/15 1646 10/06/15 1055   10/04/15 1245  cefTRIAXone (ROCEPHIN) 2 g in dextrose 5 % 50 mL IVPB     2 g 100 mL/hr over 30 Minutes Intravenous  Once  10/04/15 1236 10/04/15 1456        Objective:   Filed Vitals:   10/06/15 0527 10/06/15 2125 10/07/15 0534 10/07/15 0805  BP: 135/70 151/86 167/98 146/88  Pulse: 88 113 100 98  Temp: 99.5 F (37.5 C) 99 F (37.2 C) 97.6 F (36.4 C)   TempSrc: Oral Oral Oral   Resp: 18 18 18    Height:      Weight:      SpO2: 95% 99% 99%     Wt Readings from Last 3 Encounters:  10/04/15 54.5 kg (120 lb 2.4 oz)  09/25/15 57.607 kg (127 lb)  08/27/15 55.792 kg (123 lb)     Intake/Output Summary (Last 24 hours) at 10/07/15 0939 Last data filed at 10/06/15 1829  Gross per 24 hour  Intake    320 ml  Output   3000 ml  Net  -2680 ml     Physical Exam  Awake but mildly confused, No new F.N deficits, chronic  generalized weakness due to MS, Normal affect [place].AT, left eye has upper lid swelling, conjunctiva is red, minimal secretion Supple Neck,No JVD, No cervical lymphadenopathy appriciated.  Symmetrical Chest wall movement, Good air movement bilaterally, CTAB RRR,No Gallops,Rubs or new Murmurs, No Parasternal Heave +ve B.Sounds, Abd Soft, No tenderness, No organomegaly appriciated, No rebound - guarding or rigidity. No Cyanosis, Clubbing or edema, No new Rash or bruise       Data Review:   Micro Results Recent Results (from the past 240 hour(s))  Urine culture     Status: None   Collection Time: 10/04/15 10:20 AM  Result Value Ref Range Status   Specimen Description URINE, CATHETERIZED  Final   Special Requests NONE  Final   Culture NO GROWTH 1 DAY  Final   Report Status 10/05/2015 FINAL  Final  Culture, blood (Routine X 2) w Reflex to ID Panel     Status: None (Preliminary result)   Collection Time: 10/04/15  5:25 PM  Result Value Ref Range Status   Specimen Description BLOOD LEFT ANTECUBITAL  Final   Special Requests BOTTLES DRAWN AEROBIC AND ANAEROBIC 5CC  Final   Culture NO GROWTH 2 DAYS  Final   Report Status PENDING  Incomplete  Culture, blood (Routine X 2) w Reflex to ID Panel     Status: None (Preliminary result)   Collection Time: 10/04/15  5:30 PM  Result Value Ref Range Status   Specimen Description BLOOD LEFT HAND  Final   Special Requests IN PEDIATRIC BOTTLE 0.5CC  Final   Culture NO GROWTH 2 DAYS  Final   Report Status PENDING  Incomplete    Radiology Reports Dg Chest 2 View  09/16/2015  CLINICAL DATA:  Confusion and altered mental status. EXAM: CHEST  2 VIEW COMPARISON:  07/29/2015 and 03/12/2015 FINDINGS: Prominent linear density at the left lung base is suggestive for some atelectasis. Otherwise, the lungs are clear. Stable appearance of the heart and mediastinum. The thoracic aorta appears tortuous and similar to the prior examinations. No pleural effusions. Mild  degenerative changes in the thoracic spine. IMPRESSION: Probable left basilar atelectasis. Otherwise, no acute chest findings. Electronically Signed   By: Markus Daft M.D.   On: 09/16/2015 12:57   Ct Head Wo Contrast  10/04/2015  CLINICAL DATA:  Nonverbal and combative. EXAM: CT HEAD WITHOUT CONTRAST TECHNIQUE: Contiguous axial images were obtained from the base of the skull through the vertex without intravenous contrast. COMPARISON:  Head CT dated 09/16/2015 and of brain MRI  dated 07/29/2015. FINDINGS: There is mild generalized brain atrophy with commensurate dilatation of the ventricles and sulci. Chronic small vessel ischemic changes again noted within the bilateral periventricular and subcortical white matter regions. There is no mass, hemorrhage, edema or other evidence of acute parenchymal abnormality. No extra-axial hemorrhage. There are chronic calcified atherosclerotic changes of the large vessels at the skull base. No osseous abnormality seen. Paranasal sinuses and mastoid air cells are clear. Superficial soft tissues are unremarkable. IMPRESSION: 1. Atrophy and chronic ischemic changes in the white matter. 2. No acute findings.  No intracranial mass, hemorrhage or edema. Electronically Signed   By: Franki Cabot M.D.   On: 10/04/2015 10:41   Ct Head Wo Contrast  09/16/2015  CLINICAL DATA:  Confusion, altered mental status. EXAM: CT HEAD WITHOUT CONTRAST TECHNIQUE: Contiguous axial images were obtained from the base of the skull through the vertex without intravenous contrast. COMPARISON:  07/29/2015 FINDINGS: Mild chronic small vessel disease changes throughout the deep white matter. No acute intracranial abnormality. Specifically, no hemorrhage, hydrocephalus, mass lesion, acute infarction, or significant intracranial injury. No acute calvarial abnormality. Visualized paranasal sinuses and mastoids clear. Orbital soft tissues unremarkable. IMPRESSION: No acute intracranial abnormality. Chronic  small vessel disease, stable. Electronically Signed   By: Rolm Baptise M.D.   On: 09/16/2015 13:16   Mr Brain Wo Contrast  10/04/2015  CLINICAL DATA:  Right arm and leg weakness. Altered mental status. Nonverbal. Vomiting. Fall yesterday. History of multiple sclerosis. EXAM: MRI HEAD WITHOUT CONTRAST TECHNIQUE: Multiplanar, multiecho pulse sequences of the brain and surrounding structures were obtained without intravenous contrast. COMPARISON:  Head CT 10/04/2015 and MRI 07/29/2015 FINDINGS: Mild diffusion weighted signal hyperintensity in the left greater than right corona radiata and internal capsules is similar to the prior MRI and and without restricted diffusion. No restricted diffusion is seen elsewhere to indicate acute infarct. 6 mm subependymal lesion with blood products and fluid fluid level in the atrium of the left lateral ventricle is unchanged in size although demonstrates less diffusion-weighted signal abnormality than on the prior MRI. Confluent T2 hyperintensities in the subcortical and deep cerebral white matter including prominent periventricular involvement as well as patchy T2 hyperintensity in the pons are unchanged. Multiple black holes are noted. There is prominent atrophy of the corpus callosum. No cerebellar lesions are identified. There is moderate cerebral atrophy. There may be mild bilateral optic nerve atrophy, however evaluation is limited on this nondedicated study. Minimal mucosal thickening is noted in the ethmoid air cells. Mastoid air cells are clear. Major intracranial vascular flow voids are preserved. IMPRESSION: 1. No acute intracranial abnormality. 2. Advanced multiple sclerosis without significant change from the prior MRI. 3. Unchanged 6 mm hemorrhagic subependymal cyst/nodule in the left lateral ventricle. Electronically Signed   By: Logan Bores M.D.   On: 10/04/2015 15:15   Dg Chest Portable 1 View  10/04/2015  CLINICAL DATA:  Possible aspiration.  Vomiting.  EXAM: PORTABLE CHEST 1 VIEW COMPARISON:  September 16, 2015. FINDINGS: Stable cardiomediastinal silhouette. Enlargement and tortuosity of descending thoracic aorta is noted which is stable. No pneumothorax or pleural effusion is noted. No acute pulmonary disease is noted. Bony thorax is intact. IMPRESSION: No acute cardiopulmonary abnormality seen. Electronically Signed   By: Marijo Conception, M.D.   On: 10/04/2015 09:42   Ct Orbits W/cm  10/06/2015  CLINICAL DATA:  Left eye pain and swelling with drainage. Conjunctivitis. Subsequent encounter. EXAM: CT ORBITS WITH CONTRAST TECHNIQUE: Multidetector CT imaging of the orbits was  performed following the bolus administration of intravenous contrast. CONTRAST:  48mL OMNIPAQUE IOHEXOL 300 MG/ML  SOLN COMPARISON:  10/04/2015 brain MR. FINDINGS: Left preseptal soft tissue swelling most notable between the left globe and the left lacrimal gland (with fluid collection 2.5 mm maximal thickness), consistent with preseptal inflammation without evidence of postseptal extension. Symmetric normal appearance of the superior ophthalmic veins. Symmetric appearance of the cavernous sinus region. Carotid calcifications incidentally noted. Intracranial atrophy and white matter changes better demonstrated on recent MR. Mastoid air cells, middle ear cavities and paranasal sinuses are clear. IMPRESSION: Left preseptal soft tissue swelling most notable between the left globe and the left lacrimal gland (with fluid collection 2.5 mm maximal thickness), consistent with preseptal inflammation without evidence of postseptal extension. Electronically Signed   By: Genia Del M.D.   On: 10/06/2015 13:03     CBC  Recent Labs Lab 10/02/15 2010 10/04/15 0914  10/04/15 1725 10/05/15 0447 10/06/15 0420 10/06/15 0845 10/07/15 0450  WBC 5.0 5.3  --  6.8 6.8 7.0 7.4 6.4  HGB 10.9* 11.8*  < > 10.9* 11.0* 11.8* 12.2 12.7  HCT 33.8* 36.6  < > 33.7* 33.1* 35.4* 36.4 38.7  PLT 150 167  --   156 159 160 170 176  MCV 83.9 84.7  --  86.0 84.0 83.3 82.4 82.7  MCH 27.0 27.3  --  27.8 27.9 27.8 27.6 27.1  MCHC 32.2 32.2  --  32.3 33.2 33.3 33.5 32.8  RDW 15.8* 16.2*  --  16.6* 16.2* 15.6* 15.4 15.6*  LYMPHSABS 2.1 3.3  --   --   --   --   --   --   MONOABS 0.3 0.2  --   --   --   --   --   --   EOSABS 0.2 0.2  --   --   --   --   --   --   BASOSABS 0.0 0.0  --   --   --   --   --   --   < > = values in this interval not displayed.  Chemistries   Recent Labs Lab 10/02/15 2010 10/04/15 0914 10/04/15 0922 10/04/15 1725 10/05/15 0447 10/06/15 0420 10/07/15 0450  NA 147* 145 149*  --  148* 142 143  K 3.1* 3.2* 3.2*  --  3.5 2.9* 3.3*  CL 115* 115* 113*  --  115* 106 106  CO2 23 20*  --   --  23 25 25   GLUCOSE 152* 161* 154*  --  92 108* 109*  BUN 12 10 10   --  <5* <5* <5*  CREATININE 0.99 1.07* 1.00 0.84 0.86 0.95 0.93  CALCIUM 9.1 9.0  --   --  9.0 9.4 9.6  MG  --   --   --   --   --   --  2.4  AST 16 14*  --   --  21  --   --   ALT 16 15  --   --  16  --   --   ALKPHOS 73 73  --   --  71  --   --   BILITOT 0.4 0.4  --   --  0.4  --   --    ------------------------------------------------------------------------------------------------------------------ estimated creatinine clearance is 43.9 mL/min (by C-G formula based on Cr of 0.93). ------------------------------------------------------------------------------------------------------------------ No results for input(s): HGBA1C in the last 72 hours. ------------------------------------------------------------------------------------------------------------------ No results for input(s): CHOL, HDL, LDLCALC, TRIG, CHOLHDL,  LDLDIRECT in the last 72 hours. ------------------------------------------------------------------------------------------------------------------  Recent Labs  10/04/15 1040  TSH 0.456    ------------------------------------------------------------------------------------------------------------------ No results for input(s): VITAMINB12, FOLATE, FERRITIN, TIBC, IRON, RETICCTPCT in the last 72 hours.  Coagulation profile  Recent Labs Lab 10/04/15 0914  INR 1.08    No results for input(s): DDIMER in the last 72 hours.  Cardiac Enzymes  Recent Labs Lab 10/04/15 1725 10/04/15 2221 10/05/15 0447  TROPONINI <0.03 <0.03 <0.03   ------------------------------------------------------------------------------------------------------------------ Invalid input(s): POCBNP   Time Spent in minutes 35   Oluwademilade Mckiver K M.D on 10/07/2015 at 9:39 AM  Between 7am to 7pm - Pager - [phone]  After 7pm go to [email or web address] - password [facility]  [facility] -  Office  [phone]

## 2015-10-07 NOTE — NC FL2 (Signed)
[place] MEDICAID FL2 LEVEL OF CARE SCREENING TOOL     IDENTIFICATION  Patient Name: Wanda [name] Birthdate: May 14, 1951 Sex: female Admission Date (Current Location): 10/04/2015  [facility] and [place] Number:  Herbalist and Address:  The . [facility], [place] [address] [place], [place] [address]      Provider Number: [id number]  Attending Physician Name and Address:  Thurnell Lose, MD  Relative Name and Phone Number:       Current Level of Care: Hospital Recommended Level of Care: [place] Prior Approval Number:    Date Approved/Denied:   PASRR Number: [id number]  Discharge Plan: SNF    Current Diagnoses: Patient Active Problem List   Diagnosis Date Noted  . Eye pain   . Right sided weakness   . Altered level of consciousness 10/04/2015  . UTI (lower urinary tract infection) 09/16/2015  . Acute confusional state 08/08/2015  . Normocytic anemia 07/30/2015  . Altered mental status 07/29/2015  . Hypokalemia   . Gait difficulty 08/10/2014  . Multiple sclerosis ([place]) 10/25/2013  . Insomnia 03/23/2013  . Fibroids 12/29/2012  . Elevated cholesterol   . Hypertension   . Arthritis   . Cervical dysplasia     Orientation RESPIRATION BLADDER Height & Weight    Self, Place  Normal Continent, Indwelling catheter (Urinary Catheter) 5' (152.4 cm) 120 lbs.  BEHAVIORAL SYMPTOMS/MOOD NEUROLOGICAL BOWEL NUTRITION STATUS   (N/A)   Incontinent  (Please see DC Summary)  AMBULATORY STATUS COMMUNICATION OF NEEDS Skin   Extensive Assist Verbally Normal                       Personal Care Assistance Level of Assistance  Bathing, Feeding, Dressing Bathing Assistance: Maximum assistance Feeding assistance: Limited assistance (Able to feed self) Dressing Assistance: Maximum assistance     Functional Limitations Info             SPECIAL CARE FACTORS FREQUENCY  PT (By licensed PT)     PT Frequency: 5x/week              Contractures      Additional Factors Info  Code Status, Allergies Code Status Info: Full Allergies Info: Baclofen           Current Medications (10/07/2015):  This is the current hospital active medication list Current Facility-Administered Medications  Medication Dose Route Frequency Provider Last Rate Last Dose  . 0.9 %  sodium chloride infusion   Intravenous Continuous Oswald Hillock, MD 100 mL/hr at 10/07/15 0324    . artificial tears (LACRILUBE) ophthalmic ointment   Left Eye Q4H PRN Kelvin Cellar, MD      . enoxaparin (LOVENOX) injection 40 mg  40 mg Subcutaneous Q24H Oswald Hillock, MD   40 mg at 10/06/15 1647  . gatifloxacin (ZYMAXID) 0.5 % ophthalmic drops 1 drop  1 drop Left Eye QID Thurnell Lose, MD      . hydrALAZINE (APRESOLINE) injection 10 mg  10 mg Intravenous Q6H PRN Thurnell Lose, MD      . lamoTRIgine (LAMICTAL) tablet 100 mg  100 mg Oral BID Oswald Hillock, MD   100 mg at 10/06/15 2219  . ondansetron (ZOFRAN) tablet 4 mg  4 mg Oral Q6H PRN Oswald Hillock, MD       Or  . ondansetron (ZOFRAN) injection 4 mg  4 mg Intravenous Q6H PRN Oswald Hillock, MD      .  piperacillin-tazobactam (ZOSYN) IVPB 3.375 g  3.375 g Intravenous Q8H Kelsy E Combs, RPH   3.375 g at 10/07/15 0324  . potassium chloride SA (K-DUR,KLOR-CON) CR tablet 40 mEq  40 mEq Oral Once Thurnell Lose, MD      . prednisoLONE acetate (PRED FORTE) 1 % ophthalmic suspension 1 drop  1 drop Left Eye QID Thurnell Lose, MD      . vancomycin (VANCOCIN) IVPB 1000 mg/200 mL premix  1,000 mg Intravenous Q24H Cecilio Asper Batchelder, RPH   1,000 mg at 10/06/15 [id number]     Discharge Medications: Please see discharge summary for a list of discharge medications.  Relevant Imaging Results:  Relevant Lab Results:   Additional Information SSN: [id number]  Benard Halsted, LCSWA

## 2015-10-07 NOTE — Clinical Social Work Note (Signed)
Clinical Social Work Assessment  Patient Details  Name: Wanda [name] MRN: [id number] Date of Birth: [birth year]  Date of referral:  10/07/15               Reason for consult:  Facility Placement                Permission sought to share information with:  Facility Sport and exercise psychologist, Family Supports Permission granted to share information::  Yes, Verbal Permission Granted (Patient disoriented; Completed assessment w/daugter.)  Name::     Wanda [name]  Agency::  [facility] SNFs  Relationship::  Daughter  Contact Information:  [phone]  Housing/Transportation Living arrangements for the past 2 months:  [place] of Information:  Adult Children Patient Interpreter Needed:  None Criminal Activity/Legal Involvement Pertinent to Current Situation/Hospitalization:  No - Comment as needed Significant Relationships:  Adult Children Lives with:  Adult Children Do you feel safe going back to the place where you live?  No Need for family participation in patient care:  Yes (Comment)  Care giving concerns:  CSW received referral for possible SNF placement at time of discharge. Patient disoriented so CSW spoke w/ patient's daughter, [name], regarding PT recommendation of SNF placement at time of discharge. Per patient's daughter, patient would benefit from SNF. Patient's daughter expressed understanding of PT recommendation and is agreeable to SNF placement at time of discharge. CSW to continue to follow and assist with discharge planning needs.   Social Worker assessment / plan:  CSW spoke with patient's daughter concerning possibility of rehab at [facility] before returning home.  Employment status:  Retired Forensic scientist:  Other (Comment Required) ([employer]) PT Recommendations:  [place] / Referral to community resources:  [place]  Patient/Family's Response to care:  Patient's daughter recognizes need for rehab  before returning home and is agreeable to a SNF in [place].   Patient/Family's Understanding of and Emotional Response to Diagnosis, Current Treatment, and Prognosis:  Patient's daughter is realistic regarding therapy needs. No questions/concerns about plan or treatment.    Emotional Assessment Appearance:  Appears stated age Attitude/Demeanor/Rapport:    Affect (typically observed):  Unable to Assess (Patient disoriented) Orientation:  Oriented to Self, Oriented to Place Alcohol / Substance use:  Not Applicable Psych involvement (Current and /or in the community):  No (Comment)  Discharge Needs  Concerns to be addressed:  Care Coordination Readmission within the last 30 days:  Yes Current discharge risk:  None Barriers to Discharge:  Continued Medical Work up   [employer], [place] 10/07/2015, 10:25 AM

## 2015-10-08 ENCOUNTER — Telehealth: Payer: Self-pay | Admitting: Neurology

## 2015-10-08 ENCOUNTER — Ambulatory Visit: Payer: Federal, State, Local not specified - PPO | Admitting: Neurology

## 2015-10-08 ENCOUNTER — Telehealth: Payer: Self-pay

## 2015-10-08 DIAGNOSIS — G3184 Mild cognitive impairment, so stated: Secondary | ICD-10-CM | POA: Diagnosis not present

## 2015-10-08 DIAGNOSIS — H1032 Unspecified acute conjunctivitis, left eye: Secondary | ICD-10-CM | POA: Diagnosis not present

## 2015-10-08 DIAGNOSIS — F329 Major depressive disorder, single episode, unspecified: Secondary | ICD-10-CM | POA: Diagnosis not present

## 2015-10-08 DIAGNOSIS — M6289 Other specified disorders of muscle: Secondary | ICD-10-CM | POA: Diagnosis not present

## 2015-10-08 DIAGNOSIS — F250 Schizoaffective disorder, bipolar type: Secondary | ICD-10-CM | POA: Diagnosis not present

## 2015-10-08 DIAGNOSIS — I10 Essential (primary) hypertension: Secondary | ICD-10-CM | POA: Diagnosis not present

## 2015-10-08 DIAGNOSIS — M6281 Muscle weakness (generalized): Secondary | ICD-10-CM | POA: Diagnosis not present

## 2015-10-08 DIAGNOSIS — R278 Other lack of coordination: Secondary | ICD-10-CM | POA: Diagnosis not present

## 2015-10-08 DIAGNOSIS — H3530 Unspecified macular degeneration: Secondary | ICD-10-CM | POA: Diagnosis not present

## 2015-10-08 DIAGNOSIS — B300 Keratoconjunctivitis due to adenovirus: Secondary | ICD-10-CM | POA: Diagnosis not present

## 2015-10-08 DIAGNOSIS — G35 Multiple sclerosis: Secondary | ICD-10-CM | POA: Diagnosis not present

## 2015-10-08 DIAGNOSIS — F339 Major depressive disorder, recurrent, unspecified: Secondary | ICD-10-CM | POA: Diagnosis not present

## 2015-10-08 DIAGNOSIS — R488 Other symbolic dysfunctions: Secondary | ICD-10-CM | POA: Diagnosis not present

## 2015-10-08 DIAGNOSIS — N390 Urinary tract infection, site not specified: Secondary | ICD-10-CM | POA: Diagnosis not present

## 2015-10-08 DIAGNOSIS — F419 Anxiety disorder, unspecified: Secondary | ICD-10-CM | POA: Diagnosis not present

## 2015-10-08 DIAGNOSIS — G9340 Encephalopathy, unspecified: Secondary | ICD-10-CM | POA: Diagnosis not present

## 2015-10-08 DIAGNOSIS — H179 Unspecified corneal scar and opacity: Secondary | ICD-10-CM | POA: Diagnosis not present

## 2015-10-08 DIAGNOSIS — H5712 Ocular pain, left eye: Secondary | ICD-10-CM | POA: Diagnosis not present

## 2015-10-08 DIAGNOSIS — R5381 Other malaise: Secondary | ICD-10-CM | POA: Diagnosis not present

## 2015-10-08 MED ORDER — BACLOFEN 10 MG PO TABS: 10.0000 mg | ORAL_TABLET | Freq: Every day | ORAL | Status: DC

## 2015-10-08 MED ORDER — SACCHAROMYCES BOULARDII 250 MG PO CAPS: 250.0000 mg | ORAL_CAPSULE | Freq: Two times a day (BID) | ORAL | Status: DC

## 2015-10-08 MED ORDER — CLONAZEPAM 1 MG PO TABS: 1.0000 mg | ORAL_TABLET | Freq: Every day | ORAL | Status: DC | PRN

## 2015-10-08 MED ORDER — HALOPERIDOL LACTATE 5 MG/ML IJ SOLN
2.0000 mg | Freq: Four times a day (QID) | INTRAMUSCULAR | Status: DC | PRN
  Filled 2015-10-08: qty 1

## 2015-10-08 MED ORDER — POTASSIUM CHLORIDE CRYS ER 20 MEQ PO TBCR
40.0000 meq | EXTENDED_RELEASE_TABLET | Freq: Once | ORAL | Status: AC
  Administered 2015-10-08: 40 meq via ORAL
  Filled 2015-10-08: qty 2

## 2015-10-08 MED ORDER — GATIFLOXACIN 0.5 % OP SOLN: 1.0000 [drp] | Freq: Four times a day (QID) | OPHTHALMIC | Status: DC

## 2015-10-08 MED ORDER — CLINDAMYCIN HCL 300 MG PO CAPS
300.0000 mg | ORAL_CAPSULE | Freq: Four times a day (QID) | ORAL | Status: DC
Start: 2015-10-08 — End: 2015-11-10

## 2015-10-08 MED ORDER — PREDNISOLONE ACETATE 1 % OP SUSP: 1.0000 [drp] | Freq: Four times a day (QID) | OPHTHALMIC | Status: DC

## 2015-10-08 NOTE — Telephone Encounter (Signed)
I have called her daughter [name], she is concerned about that her mother's presentation of sudden worsening confusion is due to medication side effect. She has is now taking baclofen ER 40mg  bid.  Chart reviewed, patient UDS was positive for benzodiazepine, and during examination, she was intermittent for ringing and out of sleep, consistent with clinical presentation after taking clonazepam  In addition, there was evidence of UTI, epidemic keratoconjunctivitis, she was treated with antibiotics, eyedrops, her symptoms overall has much improved,

## 2015-10-08 NOTE — Telephone Encounter (Signed)
Called Crystal back and she no longer needed any information.

## 2015-10-08 NOTE — Telephone Encounter (Signed)
Crystal from [facility] is calling to get a verification on Baclofen dosage. Please call [phone]

## 2015-10-08 NOTE — Clinical Social Work Placement (Signed)
   CLINICAL SOCIAL WORK PLACEMENT  NOTE  Date:  10/08/2015  Patient Details  Name: Wanda [name] MRN: [id number] Date of Birth: 1951-04-10  Clinical Social Work is seeking post-discharge placement for this patient at the [place] level of care (*CSW will initial, date and re-position this form in  chart as items are completed):  Yes   Patient/family provided with [facility] Work Department's list of facilities offering this level of care within the geographic area requested by the patient (or if unable, by the patient's family).  Yes   Patient/family informed of their freedom to choose among providers that offer the needed level of care, that participate in Medicare, Medicaid or managed care program needed by the patient, have an available bed and are willing to accept the patient.  Yes   Patient/family informed of [facility]'s ownership interest in [facility] and [facility], as well as of the fact that they are under no obligation to receive care at these facilities.  PASRR submitted to EDS on 10/07/15     PASRR number received on 10/07/15     Existing PASRR number confirmed on       FL2 transmitted to all facilities in geographic area requested by pt/family on 10/07/15     FL2 transmitted to all facilities within larger geographic area on       Patient informed that his/her managed care company has contracts with or will negotiate with certain facilities, including the following:        Yes   Patient/family informed of bed offers received.  Patient chooses bed at [facility]     Physician recommends and patient chooses bed at      Patient to be transferred to [facility] on 10/08/15.  Patient to be transferred to facility by PTAR     Patient family notified on 10/08/15 of transfer.  Name of family member notified:  Daughter, [name]     PHYSICIAN Please prepare prescriptions     Additional Comment:     _______________________________________________ Benard Halsted, [place] 10/08/2015, 10:37 AM

## 2015-10-08 NOTE — Progress Notes (Signed)
Report given to [facility].  PIV removed.  Pt taken via PTAR.  Daughter to follow pt.

## 2015-10-08 NOTE — Discharge Instructions (Signed)
Follow with Primary MD Wanda [name],[name], PA-C in 2-3 days   Get CBC, CMP, 2 view Chest X ray, final eye culture results checked  by SNF MD in 2-3 days.    Activity: As tolerated with Full fall precautions use walker/cane & assistance as needed   Disposition Home     Diet:   Heart Healthy with feeding assistance and aspiration precautions.  For Heart failure patients - Check your Weight same time everyday, if you gain over 2 pounds, or you develop in leg swelling, experience more shortness of breath or chest pain, call your Primary MD immediately. Follow Cardiac Low Salt Diet and 1.5 lit/day fluid restriction.   On your next visit with your primary care physician please Get Medicines reviewed and adjusted.   Please request your Prim.MD to go over all Hospital Tests and Procedure/Radiological results at the follow up, please get all Hospital records sent to your Prim MD by signing hospital release before you go home.   If you experience worsening of your admission symptoms, develop shortness of breath, life threatening emergency, suicidal or homicidal thoughts you must seek medical attention immediately by calling 911 or calling your MD immediately  if symptoms less severe.  You Must read complete instructions/literature along with all the possible adverse reactions/side effects for all the Medicines you take and that have been prescribed to you. Take any new Medicines after you have completely understood and accpet all the possible adverse reactions/side effects.   Do not drive, operating heavy machinery, perform activities at heights, swimming or participation in water activities or provide baby sitting services if your were admitted for syncope or siezures until you have seen by Primary MD or a Neurologist and advised to do so again.  Do not drive when taking Pain medications.    Do not take more than prescribed Pain, Sleep and Anxiety Medications  Special Instructions: If you have  smoked or chewed Tobacco  in the last 2 yrs please stop smoking, stop any regular Alcohol  and or any Recreational drug use.  Wear Seat belts while driving.   Please note  You were cared for by a hospitalist during your hospital stay. If you have any questions about your discharge medications or the care you received while you were in the hospital after you are discharged, you can call the unit and asked to speak with the hospitalist on call if the hospitalist that took care of you is not available. Once you are discharged, your primary care physician will handle any further medical issues. Please note that NO REFILLS for any discharge medications will be authorized once you are discharged, as it is imperative that you return to your primary care physician (or establish a relationship with a primary care physician if you do not have one) for your aftercare needs so that they can reassess your need for medications and monitor your lab values.

## 2015-10-08 NOTE — Care Management Note (Signed)
Case Management Note  Patient Details  Name: Wanda [name] MRN: [id number] Date of Birth: 02/02/1951  Subjective/Objective:                 Patient admitted from home with daughter providing help to her, with AMS.    Action/Plan:  Discharge to SNF today as facilitated through [place].  Expected Discharge Date:                  Expected Discharge Plan:  Skilled Nursing Facility  In-House Referral:  Clinical Social Work  Discharge planning Services     Post Acute Care Choice:    Choice offered to:     DME Arranged:    DME Agency:     HH Arranged:    [place] Agency:     Status of Service:  Completed, signed off  Medicare Important Message Given:    Date Medicare IM Given:    Medicare IM give by:    Date Additional Medicare IM Given:    Additional Medicare Important Message give by:     If discussed at [place] of Stay Meetings, dates discussed:    Additional Comments:  Carles Collet, RN 10/08/2015, 10:31 AM

## 2015-10-08 NOTE — Consult Note (Signed)
   Ophthalmology follow up note.   S:  'Eye Feels Better"   O:  Patient oriented to person. Patient combative and non-cooperative with exam. Refuses dilation or complete eye exam.   Ext: Iimprovederythema, and lid edema PF: 8/7 LF: 14/14 PenLight Exam LL: Trace [place] OS C/S: 2+Injection, resolved   K: Peripheral infiltrate AC: Appears deep and quiet I: unremarkable  Lens: 2+NSC   CT: Pre-septal edema without orbital inflammation   Eye Culture: Pending:   A/P:   epidemic keratoconjunctivitis [facility]) with corneal infiltrate.  Patient with altered mental status and refusing complete exam.   Recommend - Transition to PO Abx for total 7 days - Continue Vigamox QID Left Eye - Continue Prednisolone Acetate TID Left Eye - Culture- aerobic w/ Chlamydia and gonnorrhea.   Will continue to follow.

## 2015-10-08 NOTE — Progress Notes (Signed)
Physical Therapy Treatment Patient Details Name: Wanda [name] MRN: [id number] DOB: 1951-02-17 Today's Date: 10/08/2015    History of Present Illness pt presents with Encephalopathy, UTI, and Periorbital Cellulitis.  pt with hx of MS, R Macular Degeneration, HTN, Depression, and Anxiety.    PT Comments    Pt making slow, steady progress. Continues to need significant assist and continue to recommend ST-SNF.  Follow Up Recommendations  SNF     Equipment Recommendations  None recommended by PT    Recommendations for Other Services       Precautions / Restrictions Precautions Precautions: Fall Restrictions Weight Bearing Restrictions: No    Mobility  Bed Mobility Overal bed mobility: Needs Assistance Bed Mobility: Supine to Sit     Supine to sit: Mod assist     General bed mobility comments: Assist to bring legs off bed and to elevate trunk.  Transfers Overall transfer level: Needs assistance Equipment used: Rolling walker (2 wheeled) Transfers: Sit to/from [facility] Sit to Stand: +2 physical assistance;Mod assist Stand pivot transfers: +2 physical assistance;Mod assist       General transfer comment: Pt with heavy posterior lean and assist to bring hips up and shift weight forward.  Ambulation/Gait Ambulation/Gait assistance: +2 physical assistance;Mod assist;+2 safety/equipment Ambulation Distance (Feet): 15 Feet Assistive device: Rolling walker (2 wheeled) Gait Pattern/deviations: Step-through pattern;Decreased step length - right;Decreased dorsiflexion - right;Shuffle;Leaning posteriorly Gait velocity: decr Gait velocity interpretation: Below normal speed for age/gender General Gait Details: Assist for heavy posterior lean. Verbal/tactile cues to stay close to the walker.   Stairs            Wheelchair Mobility    Modified Rankin (Stroke Patients Only)       Balance Overall balance assessment: Needs  assistance Sitting-balance support: Bilateral upper extremity supported;Feet supported Sitting balance-Leahy Scale: Poor   Postural control: Posterior lean Standing balance support: Bilateral upper extremity supported Standing balance-Leahy Scale: Poor Standing balance comment: walker and mod A to maintain static standing. Posterior lean.                    Cognition Arousal/Alertness: Awake/alert Behavior During Therapy: Impulsive Overall Cognitive Status: Impaired/Different from baseline Area of Impairment: Attention;Memory;Following commands;Safety/judgement;Awareness;Problem solving   Current Attention Level: Sustained Memory: Decreased recall of precautions;Decreased short-term memory Following Commands: Follows one step commands consistently Safety/Judgement: Decreased awareness of safety;Decreased awareness of deficits Awareness: Intellectual Problem Solving: Slow processing;Requires verbal cues;Requires tactile cues      Exercises      General Comments        Pertinent Vitals/Pain Pain Assessment: No/denies pain    Home Living                      Prior Function            PT Goals (current goals can now be found in the care plan section) Progress towards PT goals: Progressing toward goals    Frequency  Min 2X/week    PT Plan Current plan remains appropriate;Frequency needs to be updated    Co-evaluation             End of Session Equipment Utilized During Treatment: Gait belt Activity Tolerance: Patient tolerated treatment well Patient left: with call bell/phone within reach;in chair;with chair alarm set;with family/visitor present     Time: 1041-1105 PT Time Calculation (min) (ACUTE ONLY): 24 min  Charges:  $Gait Training: 23-37 mins  G Codes:      Salima Rumer 10/08/2015, 12:08 PM [facility] PT [phone]

## 2015-10-08 NOTE — Progress Notes (Signed)
Patient will DC to: [facility] Anticipated DC date: 10/08/15 Family notified: Daughter, Media planner by: PTAR  CSW signing off.  Cedric Fishman, [place] Social Worker [phone]

## 2015-10-08 NOTE — Telephone Encounter (Signed)
I spoke to the patient's daugther, [name], in regards to the patient's hospitalization. [name] stated that she thinks the hospitalization is due to the study medication (Baclofen ER 40mg  BID), and she also thinks that this could be a side effect from the study medication. I told [name] that the Principal Investigator, Dr. Krista Blue, will review the case and determine this. [name] also stated that she would like for the patient to come off the medication if possible, as "my mom no longer has spasticity." Finally, [name] stated that her mother is still in the hospital, has not been given Baclofen during her stay, and will be discharged directly into a nursing facility. I told her that the patient can be brought in for an early discontinuation visit and could come off the study at any point. I also told her that I would ask Dr. Krista Blue to give her a call, as [name] would like to discuss the side effects she thinks her mom is having from Baclofen.

## 2015-10-08 NOTE — Discharge Summary (Addendum)
Wanda [name], is a 65 y.o. female  DOB 11/04/1950  MRN [id number].  Admission date:  10/04/2015  Admitting Physician  Oswald Hillock, MD  Discharge Date:  10/08/2015   Primary MD  JEFFERY,[name], PA-C  Recommendations for primary care physician for things to follow:   Check CBC, BMP, magnesium levels and final eye culture results in 2-3 days.  Must follow with below mentioned ophthalmology just within a week   Admission Diagnosis  UTI (lower urinary tract infection) [N39.0] Right sided weakness [M62.89]   Discharge Diagnosis  UTI (lower urinary tract infection) [N39.0] Right sided weakness [M62.89]    Active Problems:   Hypokalemia   Altered mental status   UTI (lower urinary tract infection)   Altered level of consciousness   Right sided weakness   Eye pain      Past Medical History  Diagnosis Date  . Elevated cholesterol   . Hypertension   . Arthritis   . Cervical dysplasia   . Depression   . Anxiety   . Macular degeneration of right eye 2001  . MS (multiple sclerosis) ([place]) 08/2013  . Acute encephalopathy 03/12/2015    Past Surgical History  Procedure Laterality Date  . Colposcopy    . Knee surgery Bilateral     "had cortisone injections in my knees"  . Cholecystectomy    . Tubal ligation    . Tonsillectomy    . Colonoscopy w/ polypectomy         HPI  from the history and physical done on the day of admission:    65 year old female who  has a past medical history of Elevated cholesterol; Hypertension; Arthritis; Cervical dysplasia; Depression; Anxiety; Macular degeneration of right eye (2001); MS (multiple sclerosis) ([place]) (08/2013); and Acute encephalopathy (03/12/2015). Today was brought to the hospital by EMS for altered mental status. Patient is nonverbal at this time, no family at  bedside. History obtained from the ED notes. Patient's daughter found patient in bed vomiting not moving her right arm and right leg as much as the left and so she called EMS. Patient at baseline alert and oriented 4.  No other history obtainable. In the ED patient found to have abnormal UA, CT head was negative. She was seen by neurology and MRI brain was ordered.        Hospital Course:     1. Acute encephalopathy likely due to left conjunctivitis and UTI - cultures pending, was placed on vancomycin along with Zosyn. Ophthalmology on board. CT of the orbits reviewed, discussed with ophthalmology again on 10/06/2014, tapered down antibiotics to gatifloxacin eyedrops per ophthalmology, steroid eyedrops along with by mouth Clinda for eye infection in case she has bacterial component to her conjunctivitis. Eye cultures for chlamydia were obtained by ophthalmologist and they are pending kindly follow final eye culture results. She must follow with the below mentioned ophthalmologist within a week.  Clindamycin to be continued for 5 more days, eyedrops for 1 more week.  She was also seen by neurology  upon admission, per neurologist she was back to her baseline with much improved encephalopathy on 10/06/2015.   2. History of MS with generalized weakness. Supportive care and no acute flare. MRI brain nonacute. Unchanged 6 mm hemorrhagic subependymal cyst/nodule , outpatient follow-up with primary neurologist and PCP.  Note daughter informs that patient take 80mg  of Baclofen QHS. Will recommend keep her at 40mg  PO QHS and gradually titrate up if needed.   3. HTN - as needed hydralazine.   4. UTI. Clinically treated. Was given Rocephin here.    5. Hypokalemia. Replaced. Recheck BMP and magnesium in 2-3 days.   6. Mild off-and-on delirium. Supportive care. Minimize benzodiazepines and narcotic use.   Discharge Condition: stable  Follow UP  Follow-up Information    Follow up with  JEFFERY,[name], PA-C. Schedule an appointment as soon as possible for a visit in 3 days.   Specialty:  Family Medicine   Contact information:   [place] [id number] [phone]       Follow up with [name], MD. Schedule an appointment as soon as possible for a visit in 3 days.   Specialty:  Ophthalmology   Contact information:   [place] [id number] [phone]       Follow up with [facility] .   Specialty:  [place] information:   [address] [place] [phone]       Consults obtained -   Opthalmology Neuro  Diet and Activity recommendation: See Discharge Instructions below  Discharge Instructions         Discharge Instructions    Diet - low sodium heart healthy    Complete by:  As directed      Discharge instructions    Complete by:  As directed   Follow with Primary MD JEFFERY,[name], PA-C in 2-3 days   Get CBC, CMP, 2 view Chest X ray, final eye culture results checked  by SNF MD in 2-3 days.    Activity: As tolerated with Full fall precautions use walker/cane & assistance as needed   Disposition Home     Diet:   Heart Healthy with feeding assistance and aspiration precautions.  For Heart failure patients - Check your Weight same time everyday, if you gain over 2 pounds, or you develop in leg swelling, experience more shortness of breath or chest pain, call your Primary MD immediately. Follow Cardiac Low Salt Diet and 1.5 lit/day fluid restriction.   On your next visit with your primary care physician please Get Medicines reviewed and adjusted.   Please request your Prim.MD to go over all Hospital Tests and Procedure/Radiological results at the follow up, please get all Hospital records sent to your Prim MD by signing hospital release before you go home.   If you experience worsening of your admission symptoms, develop  shortness of breath, life threatening emergency, suicidal or homicidal thoughts you must seek medical attention immediately by calling 911 or calling your MD immediately  if symptoms less severe.  You Must read complete instructions/literature along with all the possible adverse reactions/side effects for all the Medicines you take and that have been prescribed to you. Take any new Medicines after you have completely understood and accpet all the possible adverse reactions/side effects.   Do not drive, operating heavy machinery, perform activities at heights, swimming or participation in water activities or provide baby sitting services if your were admitted for syncope or siezures  until you have seen by Primary MD or a Neurologist and advised to do so again.  Do not drive when taking Pain medications.    Do not take more than prescribed Pain, Sleep and Anxiety Medications  Special Instructions: If you have smoked or chewed Tobacco  in the last 2 yrs please stop smoking, stop any regular Alcohol  and or any Recreational drug use.  Wear Seat belts while driving.   Please note  You were cared for by a hospitalist during your hospital stay. If you have any questions about your discharge medications or the care you received while you were in the hospital after you are discharged, you can call the unit and asked to speak with the hospitalist on call if the hospitalist that took care of you is not available. Once you are discharged, your primary care physician will handle any further medical issues. Please note that NO REFILLS for any discharge medications will be authorized once you are discharged, as it is imperative that you return to your primary care physician (or establish a relationship with a primary care physician if you do not have one) for your aftercare needs so that they can reassess your need for medications and monitor your lab values.     Increase activity slowly    Complete by:  As  directed              Discharge Medications       Medication List    STOP taking these medications        ibuprofen 200 MG tablet  Commonly known as:  ADVIL,MOTRIN     meloxicam 15 MG tablet  Commonly known as:  MOBIC      TAKE these medications        baclofen 10 MG tablet  Commonly known as:  LIORESAL  Take 1 tablet (10 mg total) by mouth at bedtime. daughter confirms 80mg  PO QHS, recommend starting 40mg  PO qhs for now and monitor.     clindamycin 300 MG capsule  Commonly known as:  CLEOCIN  Take 1 capsule (300 mg total) by mouth every 6 (six) hours. For 5 more days     clonazePAM 1 MG tablet  Commonly known as:  KLONOPIN  Take 1 tablet (1 mg total) by mouth daily as needed for anxiety. Anxiety/sleep     gatifloxacin 0.5 % Soln  Commonly known as:  ZYMAXID  Place 1 drop into the left eye 4 (four) times daily. For one more week     Investigational - Study Medication  Take 80 mg by mouth at bedtime. Additional Study Details:Patient is on a study drug with NEURO- Baclofen ER 40 MG, takes 80 mg at night     LamoTRIgine 100 MG Tb24  Commonly known as:  LAMICTAL XR  2 tabs po qhs     modafinil 200 MG tablet  Commonly known as:  PROVIGIL  Take 1 tablet (200 mg total) by mouth daily.     OVER THE COUNTER MEDICATION  Take 1 tablet by mouth daily. Neuro optimizer     prednisoLONE acetate 1 % ophthalmic suspension  Commonly known as:  PRED FORTE  Place 1 drop into the left eye 4 (four) times daily. For one more week     saccharomyces boulardii 250 MG capsule  Commonly known as:  FLORASTOR  Take 1 capsule (250 mg total) by mouth 2 (two) times daily.     TYSABRI 300 MG/15ML injection  Generic drug:  natalizumab  Inject 300 mg into the vein every 30 (thirty) days.     Vitamin D3 2000 units capsule  Take 2,000 Units by mouth daily.        Major procedures and Radiology Reports - PLEASE review detailed and final reports for all details, in brief -   MRI  brain nonacute. Unchanged 6 mm hemorrhagic subependymal cyst/nodule   CT Orbits- L.Eye preseptal inflammation   Dg Chest 2 View  09/16/2015  CLINICAL DATA:  Confusion and altered mental status. EXAM: CHEST  2 VIEW COMPARISON:  07/29/2015 and 03/12/2015 FINDINGS: Prominent linear density at the left lung base is suggestive for some atelectasis. Otherwise, the lungs are clear. Stable appearance of the heart and mediastinum. The thoracic aorta appears tortuous and similar to the prior examinations. No pleural effusions. Mild degenerative changes in the thoracic spine. IMPRESSION: Probable left basilar atelectasis. Otherwise, no acute chest findings. Electronically Signed   By: Markus Daft M.D.   On: 09/16/2015 12:57   Ct Head Wo Contrast  10/04/2015  CLINICAL DATA:  Nonverbal and combative. EXAM: CT HEAD WITHOUT CONTRAST TECHNIQUE: Contiguous axial images were obtained from the base of the skull through the vertex without intravenous contrast. COMPARISON:  Head CT dated 09/16/2015 and of brain MRI dated 07/29/2015. FINDINGS: There is mild generalized brain atrophy with commensurate dilatation of the ventricles and sulci. Chronic small vessel ischemic changes again noted within the bilateral periventricular and subcortical white matter regions. There is no mass, hemorrhage, edema or other evidence of acute parenchymal abnormality. No extra-axial hemorrhage. There are chronic calcified atherosclerotic changes of the large vessels at the skull base. No osseous abnormality seen. Paranasal sinuses and mastoid air cells are clear. Superficial soft tissues are unremarkable. IMPRESSION: 1. Atrophy and chronic ischemic changes in the white matter. 2. No acute findings.  No intracranial mass, hemorrhage or edema. Electronically Signed   By: Franki Cabot M.D.   On: 10/04/2015 10:41   Ct Head Wo Contrast  09/16/2015  CLINICAL DATA:  Confusion, altered mental status. EXAM: CT HEAD WITHOUT CONTRAST TECHNIQUE:  Contiguous axial images were obtained from the base of the skull through the vertex without intravenous contrast. COMPARISON:  07/29/2015 FINDINGS: Mild chronic small vessel disease changes throughout the deep white matter. No acute intracranial abnormality. Specifically, no hemorrhage, hydrocephalus, mass lesion, acute infarction, or significant intracranial injury. No acute calvarial abnormality. Visualized paranasal sinuses and mastoids clear. Orbital soft tissues unremarkable. IMPRESSION: No acute intracranial abnormality. Chronic small vessel disease, stable. Electronically Signed   By: Rolm Baptise M.D.   On: 09/16/2015 13:16   Mr Brain Wo Contrast  10/04/2015  CLINICAL DATA:  Right arm and leg weakness. Altered mental status. Nonverbal. Vomiting. Fall yesterday. History of multiple sclerosis. EXAM: MRI HEAD WITHOUT CONTRAST TECHNIQUE: Multiplanar, multiecho pulse sequences of the brain and surrounding structures were obtained without intravenous contrast. COMPARISON:  Head CT 10/04/2015 and MRI 07/29/2015 FINDINGS: Mild diffusion weighted signal hyperintensity in the left greater than right corona radiata and internal capsules is similar to the prior MRI and and without restricted diffusion. No restricted diffusion is seen elsewhere to indicate acute infarct. 6 mm subependymal lesion with blood products and fluid fluid level in the atrium of the left lateral ventricle is unchanged in size although demonstrates less diffusion-weighted signal abnormality than on the prior MRI. Confluent T2 hyperintensities in the subcortical and deep cerebral white matter including prominent periventricular involvement as well as patchy T2 hyperintensity in the pons are unchanged. Multiple black  holes are noted. There is prominent atrophy of the corpus callosum. No cerebellar lesions are identified. There is moderate cerebral atrophy. There may be mild bilateral optic nerve atrophy, however evaluation is limited on this  nondedicated study. Minimal mucosal thickening is noted in the ethmoid air cells. Mastoid air cells are clear. Major intracranial vascular flow voids are preserved. IMPRESSION: 1. No acute intracranial abnormality. 2. Advanced multiple sclerosis without significant change from the prior MRI. 3. Unchanged 6 mm hemorrhagic subependymal cyst/nodule in the left lateral ventricle. Electronically Signed   By: Logan Bores M.D.   On: 10/04/2015 15:15   Dg Chest Portable 1 View  10/04/2015  CLINICAL DATA:  Possible aspiration.  Vomiting. EXAM: PORTABLE CHEST 1 VIEW COMPARISON:  September 16, 2015. FINDINGS: Stable cardiomediastinal silhouette. Enlargement and tortuosity of descending thoracic aorta is noted which is stable. No pneumothorax or pleural effusion is noted. No acute pulmonary disease is noted. Bony thorax is intact. IMPRESSION: No acute cardiopulmonary abnormality seen. Electronically Signed   By: Marijo Conception, M.D.   On: 10/04/2015 09:42   Ct Orbits W/cm  10/06/2015  CLINICAL DATA:  Left eye pain and swelling with drainage. Conjunctivitis. Subsequent encounter. EXAM: CT ORBITS WITH CONTRAST TECHNIQUE: Multidetector CT imaging of the orbits was performed following the bolus administration of intravenous contrast. CONTRAST:  22mL OMNIPAQUE IOHEXOL 300 MG/ML  SOLN COMPARISON:  10/04/2015 brain MR. FINDINGS: Left preseptal soft tissue swelling most notable between the left globe and the left lacrimal gland (with fluid collection 2.5 mm maximal thickness), consistent with preseptal inflammation without evidence of postseptal extension. Symmetric normal appearance of the superior ophthalmic veins. Symmetric appearance of the cavernous sinus region. Carotid calcifications incidentally noted. Intracranial atrophy and white matter changes better demonstrated on recent MR. Mastoid air cells, middle ear cavities and paranasal sinuses are clear. IMPRESSION: Left preseptal soft tissue swelling most notable between  the left globe and the left lacrimal gland (with fluid collection 2.5 mm maximal thickness), consistent with preseptal inflammation without evidence of postseptal extension. Electronically Signed   By: Genia Del M.D.   On: 10/06/2015 13:03    Micro Results    Recent Results (from the past 240 hour(s))  Urine culture     Status: None   Collection Time: 10/04/15 10:20 AM  Result Value Ref Range Status   Specimen Description URINE, CATHETERIZED  Final   Special Requests NONE  Final   Culture NO GROWTH 1 DAY  Final   Report Status 10/05/2015 FINAL  Final  Culture, blood (Routine X 2) w Reflex to ID Panel     Status: None (Preliminary result)   Collection Time: 10/04/15  5:25 PM  Result Value Ref Range Status   Specimen Description BLOOD LEFT ANTECUBITAL  Final   Special Requests BOTTLES DRAWN AEROBIC AND ANAEROBIC 5CC  Final   Culture NO GROWTH 3 DAYS  Final   Report Status PENDING  Incomplete  Culture, blood (Routine X 2) w Reflex to ID Panel     Status: None (Preliminary result)   Collection Time: 10/04/15  5:30 PM  Result Value Ref Range Status   Specimen Description BLOOD LEFT HAND  Final   Special Requests IN PEDIATRIC BOTTLE 0.5CC  Final   Culture NO GROWTH 3 DAYS  Final   Report Status PENDING  Incomplete  Wound culture     Status: None (Preliminary result)   Collection Time: 10/07/15  8:51 AM  Result Value Ref Range Status   Specimen Description WOUND EYE  LEFT  Final   Special Requests NONE  Final   Gram Stain   Final    FEW WBC PRESENT,BOTH PMN AND MONONUCLEAR NO SQUAMOUS EPITHELIAL CELLS SEEN NO ORGANISMS SEEN Performed at [facility]    Culture NO GROWTH Performed at [facility]   Final   Report Status PENDING  Incomplete    Today   Subjective    Wanda [name] today has no headache,no chest abdominal pain,no new weakness tingling or numbness, feels much better.   Objective   Blood pressure 150/85, pulse 100, temperature 98.4 F (36.9  C), temperature source Oral, resp. rate 16, height 5' (1.524 m), weight 54.5 kg (120 lb 2.4 oz), SpO2 100 %.   Intake/Output Summary (Last 24 hours) at 10/08/15 1304 Last data filed at 10/08/15 0933  Gross per 24 hour  Intake   2320 ml  Output    634 ml  Net   1686 ml    Exam Awake Alert, Oriented x 3, No new F.N deficits, Normal affect [place].AT,L eye conjunctivitis with improved upper eyelid swelling Supple Neck,No JVD, No cervical lymphadenopathy appriciated.  Symmetrical Chest wall movement, Good air movement bilaterally, CTAB RRR,No Gallops,Rubs or new Murmurs, No Parasternal Heave +ve B.Sounds, Abd Soft, Non tender, No organomegaly appriciated, No rebound -guarding or rigidity. No Cyanosis, Clubbing or edema, No new Rash or bruise   Data Review   CBC w Diff:  Lab Results  Component Value Date   WBC 6.4 10/07/2015   WBC 6.8 06/26/2015   WBC 6.1 06/12/2015   HGB 12.7 10/07/2015   HGB 10.6* 06/26/2015   HCT 38.7 10/07/2015   HCT 34.1* 06/26/2015   HCT 32.9* 06/12/2015   PLT 176 10/07/2015   LYMPHOPCT 61 10/04/2015   MONOPCT 5 10/04/2015   EOSPCT 4 10/04/2015   BASOPCT 1 10/04/2015    CMP:  Lab Results  Component Value Date   NA 143 10/07/2015   NA 136 08/10/2014   K 3.3* 10/07/2015   CL 106 10/07/2015   CO2 25 10/07/2015   BUN <5* 10/07/2015   BUN 15 08/10/2014   CREATININE 0.93 10/07/2015   CREATININE 0.89 06/29/2013   PROT 6.1* 10/05/2015   PROT 7.2 08/10/2014   ALBUMIN 3.3* 10/05/2015   ALBUMIN 4.5 08/10/2014   BILITOT 0.4 10/05/2015   ALKPHOS 71 10/05/2015   AST 21 10/05/2015   ALT 16 10/05/2015  .   Total Time in preparing paper work, data evaluation and todays exam - 35 minutes  Thurnell Lose M.D on 10/08/2015 at 1:04 PM  [facility]   Office  [phone]

## 2015-10-09 ENCOUNTER — Telehealth: Payer: Self-pay | Admitting: Neurology

## 2015-10-09 LAB — CULTURE, BLOOD (ROUTINE X 2)
Culture: NO GROWTH
Culture: NO GROWTH

## 2015-10-09 NOTE — Telephone Encounter (Signed)
Dr. Krista Blue is going to speak her daughter, [name], to determine appropriate medications.  I will call [place] once her treatment has been decided.

## 2015-10-09 NOTE — Telephone Encounter (Signed)
[facility] called and states that the daughter does not want the pt to take the experimental drug any longer. The daughter has health care power of attorney.  Wanda [name] needs orders/ clarification about what medications the pt should be taking. Please call and advise [phone] Wanda [name], pt in room 114

## 2015-10-10 ENCOUNTER — Encounter: Payer: Self-pay | Admitting: *Deleted

## 2015-10-10 ENCOUNTER — Telehealth: Payer: Self-pay | Admitting: Neurology

## 2015-10-10 DIAGNOSIS — N390 Urinary tract infection, site not specified: Secondary | ICD-10-CM | POA: Diagnosis not present

## 2015-10-10 DIAGNOSIS — M6281 Muscle weakness (generalized): Secondary | ICD-10-CM | POA: Diagnosis not present

## 2015-10-10 DIAGNOSIS — R278 Other lack of coordination: Secondary | ICD-10-CM | POA: Diagnosis not present

## 2015-10-10 DIAGNOSIS — I10 Essential (primary) hypertension: Secondary | ICD-10-CM | POA: Diagnosis not present

## 2015-10-10 DIAGNOSIS — G3184 Mild cognitive impairment, so stated: Secondary | ICD-10-CM | POA: Diagnosis not present

## 2015-10-10 DIAGNOSIS — H1032 Unspecified acute conjunctivitis, left eye: Secondary | ICD-10-CM | POA: Diagnosis not present

## 2015-10-10 DIAGNOSIS — R5381 Other malaise: Secondary | ICD-10-CM | POA: Diagnosis not present

## 2015-10-10 DIAGNOSIS — G35 Multiple sclerosis: Secondary | ICD-10-CM | POA: Diagnosis not present

## 2015-10-10 LAB — WOUND CULTURE: Culture: NO GROWTH

## 2015-10-10 LAB — CHLAMYDIA CULTURE: CHLAMYDIA TRACHOMATIS CULTURE: NEGATIVE

## 2015-10-10 NOTE — Progress Notes (Signed)
No chief complaint on file.      PATIENT: Wanda [name] DOB: 1951/03/19  HISTORICAL   INITIAL VISIT In FEB 2015  WILLA BROCKS is accompanied by her daughter, [name], she was initially evaluated by Dr Rexene Alberts in December 2nd 2014, referred by her primary care Dr.Jeffrey  Ms. Brinker is a 65 years old right-handed African American female, with past medical history of hypertension, hyperlipidemia, longtime smoker  She presents with acute onset of slurred speech, vertigo, since November 7th 2014, also has gait difficulty,  CAT scan of the brain without contrast in November 2014 showed moderate for age nonspecific cerebral white matter changes  MRI showed multiple scattered , discrete and confluent lesions at periventricular, periatrial, peri-callosal brainstem, cranial medullary junction and upper cervical spine white matter hyperintensities on T2/flair.  Enhancing lesions in the left paramedian pons. Left posterior frontal and left parietal and tiny right frontal periventricular white matter which likely represent active demarcating plaques. multiple T1 black holes noted in the periventricular and subcortical white matter. There is mild general cerebral atrophy.  MRI scan of the cervical spine showed spinal cord hyperintensities at C2 and C5 likely represent demyelinating plaques. Contrast images show actively enhancing lesion at C2.  Laboratory evaluation showed normal or negative NMO antibody, A1c 5. 9, ANA, CPK, A41, folic acid, vitamin B1, ESR, TSH, RPR, CMP, , CBC,  CSF study show oligoclonal banding four,, normal meyelin basic protein. ACE was normal 8,   WBC 0, RBC 6, glucose 59, total protein 43,   JC virus titer was positive, 0.66, repeat titer 0.60 in 08/10/2014. 0.58 Sep 2016  She moved to [place] around 2010, In 2001, she had one episode of sudden right visual loss, reported extensive evaluation at that time, was diagnosed with right "eye problem"have received po steroid,  intraocular steroid injection, per patient MRI of the brain was done at that time too.  Her right vision never came back to normal, it was blurry, she can only read large print with the right eye  Before this episode, she was highly functional, she retired as a Software engineer, lives independently, no gait difficulty, exercise regularly, driving,   After discuss with patient, and her daughter, she has active form of RRMS, we decided to proceed with [facility] IV infusion started since January 29 2014,  she understands the potential risks, including PML infection, but with her low JC virus  titer, she has a less than 0.10/998 chance of getting PML in the first 24 months, we are monitoring her JC virus antibody titer every 3 months, repeating MRI of brain every to 6 months.  She also agrees to enroll in baclofen extended release trial, because she continues to have bilateral upper and lower extremity muscle spasticity.  UPDATE Oct 2nd 2015:  She was enrolled in [employer] baclofen extended release study since May 24 2014,, tolerating the medication well, on last visit in September 24th 2015, her baclofen dosage was increased from 20 mg, to 30 milligrams daily, she tolerated the medication very well, reported continued improvement, less  bilaterally hands muscle spasm, less bilateral lower extremity muscle spasm, she is able to squeeze them and again, she fell a few days ago, has right lateral thigh area proves, achy pain,  UPDATE Nov 6th 2015: A week before her most recent  Tysarbri infusion in October seventh 2015, she felt right arm, and leg weakness, increased gait difficulty, fatigue, getting worse since October seventh 2015, which has been persistent, she  had quit driving about 2 weeks ago, no confident in her right leg controlling the gas paddles.  We have reviewed repeat MRI of the brain together,there was evidence of Moderate-severe periventricular and subcortical and pontine and upper  cervical cord chronic demyelinating disease. Mild diffuse and severe corpus callosum atrophy.  No abnormal lesions are seen on post contrast views. Compared to MRI on 09/21/13 and 10/11/13, there is a new chronic plaque (70m) in the left basal ganglia (series 17, image 13). Also prior acute plaques no longer enhance.  This new lesions involving left basal ganglion, likely explaining for her acute worsening of right side weakness, could indicating a MS flareup,  UPDATE Nov 18th 2015:  She is here for some formal follow-up according to protocol, currently taking baclofen ER 40 mg every day without significant side effect, she was treated with Metro pack since last visit in August 10 2014, reported improvement of her right side weakness, but still not back to her baseline, could not drive, because of lack of control of right ankle movement  She also complains of excessive fatigue,  Anti-Tysarbri Antibody was negative in Nov 2015  UPDATE Feb 24th 2016: She started taking provigil 100 mg twice a day, which has been helpful, she is tolerating baclofen ER 40 mg every day, which does help her right upper and lower extremity spasticity, she continue have gait difficulty, no longer driving, low titer JC virus positive antibody, 0.6, negative Tysarbri antibody, she did has 1 flareups of right-sided weakness in October 2015, 6 months after taking Tysarbri April 2015,  She continue complains of right upper lower extremity muscle spasticity, will increase her baclofen ER to 50 mg daily  UPDATE March 8th 2016: Higher dose of baclofen ER 50 mg every night, works better, she has less right arm and right leg spasticity, clonazepam 1 mg as needed works well for her insomnia, she hopes to be on higher dose of baclofen, for better control of her right side spasticity.  UPDATE March 20 2015: She was admitted to hospital in March 22 2015, she has developed constipation few days prior, take over-the-counter laxatives,  had watery diarrhea in March 11 2015, early morning March 12 2015, while getting up using bathroom, she complains of lightheadedness, slumped over, transient loss of consciousness, mild confusion afterwards her daughter called ambulance, was noted to be hypotensive upon presentation, Blood pressure 88/54, pulse 58, temperature 94.5 F (34.7 C), resp. rate 13, weight 56.7 kg (125 lb), SpO2 100 %. With mild elevated BUN/creatinine, I have reviewed the repeat MRI of the brain in March 13 2015, stable MS lesions,no acute enhancing lesions.  I reviewed laboratory, normal CBC, BMP showed low potassium 3.4, mild elevated creatinine 1.08  UPDATE Aug 05 2015: She was found by her daughter in the bathroom, disoriented, sweaty, slumped over in July 30 2015, she complains weakness all over, disoriented, but no loss of consciousness, there was patchy memory of the event, she remembers she was awakened by the dog, wanted to use the bathroom, Could not remember rest of the event afterwards,  The confusion lasted for a few more hours  She was taken to the emergency room,Had extensive evaluations, I have personally reviewed MRI of the brain with and without contrast October 2016:No acute intracranial abnormality. Chronic demyelinating disease appears stable since June with no areas of acute demyelination identified. Small, unusual hemorrhagic subependymal cyst / lesion adjacent to the left atrium is unchanged since June.  EEG was normal,laboratory evaluation showed normal  TSH, CBC showed mild anemia 10 point 8, normal CMP, UA showed trace leukocytosis  This episode is very similar to her previous hospital admission in June 2016, Sudden onset acute confusion generalized weakness  Update October 02 2015:  She came in for IV Tysarbri infusion today, was brought in by her neighbor, she has moved out of her daughter's house, during more than 1 hour interaction before during and after infusion, she was noted to be drowsy,  drift into sleep easily, difficulty to be awakening, with slurred speech, generalized weakness, difficult to provide history   Review of system: system review of systems performed and notable only for  gait difficulty, right side weakness  ALLERGIES: Allergies  Allergen Reactions  . Baclofen     Confusion, muscle weakness, lethargy    HOME MEDICATIONS: Outpatient Prescriptions Prior to Visit  Medication Sig Dispense Refill  . atorvastatin (LIPITOR) 20 MG tablet Take 1 tablet (20 mg total) by mouth daily.  90 tablet  3  . estradiol (ESTRACE) 0.5 MG tablet Take 1 tablet (0.5 mg total) by mouth daily.  90 tablet  3  . glucosamine-chondroitin 500-400 MG tablet Take 1 tablet by mouth 2 (two) times daily with a meal.      . hydrochlorothiazide (HYDRODIURIL) 25 MG tablet take 1 tablet by mouth once daily  90 tablet  1  . medroxyPROGESTERone (PROVERA) 2.5 MG tablet Take 1 tablet (2.5 mg total) by mouth daily.  90 tablet  4  . meloxicam (MOBIC) 15 MG tablet take 1 tablet by mouth once daily  90 tablet  1  . traZODone (DESYREL) 50 MG tablet TAKE 1/2 TO 1 TABLET AT BEDTIME AS NEEDED FOR SLEEP.  30 tablet  2  . varenicline (CHANTIX) 1 MG tablet Take 1 tablet (1 mg total) by mouth 2 (two) times daily.  60 tablet  5  . zolpidem (AMBIEN) 10 MG tablet take 1 tablet by mouth at bedtime if needed  30 tablet  0     PAST MEDICAL HISTORY: Past Medical History  Diagnosis Date  . Elevated cholesterol   . Hypertension   . Arthritis   . Cervical dysplasia   . Depression   . Anxiety   . Macular degeneration of right eye 2001    PAST SURGICAL HISTORY: Past Surgical History  Procedure Laterality Date  . Colposcopy    . Knee surgery    . Cholecystectomy    . Tubal ligation      FAMILY HISTORY: Family History  Problem Relation Age of Onset  . Cancer Mother     Colon  . Diabetes Mother   . Hypertension Mother   . Arthritis Mother   . Cancer Father     prostate  . Kidney disease Brother      congenital single kidney  . Arthritis Sister   . Arthritis Sister   . Hematuria Son   . Gout Brother   . Multiple sclerosis Brother   . Arthritis Brother   . HIV Brother   . Cancer Brother     spinal    SOCIAL HISTORY:  History   Social History  . Marital Status: Widowed    Spouse Name: N/A    Number of Children: 2  . Years of Education: college   Occupational History  . retired     Social History Main Topics  . Smoking status: Current Some Day Smoker -- 1.00 packs/day for 45 years    Types: Cigarettes    Last Attempt  to Quit: 01/21/2013  . Smokeless tobacco: Never Used  . Alcohol Use: 3.6 oz/week    6 Cans of beer per week  . Drug Use: No  . Sexual Activity: No   Other Topics Concern  . Not on file   Social History Narrative   [place] up in [place] area, 1 of 10 siblings - 23 still living, married for 33 years then divorced, moved to [place] to be near daughter post retirement, als has 1 son. 2 dogs.       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     PHYSICAL EXAM   PHYSICAL EXAMNIATION:  Gen: NAD, conversant, well nourised, obese, well groomed                     Cardiovascular: Regular rate rhythm, no peripheral edema, warm, nontender. Eyes: Conjunctivae clear without exudates or hemorrhage Neck: Supple, no carotid bruise. Pulmonary: Clear to auscultation bilaterally   NEUROLOGICAL EXAM:  MENTAL STATUS: Speech/cognition:  Slurred speech  CRANIAL NERVES: CN II: Pupils were equal round reactive to light.  CN III, IV, VI: extraocular movement are normal. No ptosis. CN V: Facial sensation is intact to pinprick in all 3 divisions bilaterally. Corneal responses are intact.  CN VII: Face is symmetric with normal eye closure and smile. CN VIII: Hearing is normal to rubbing fingers CN IX, X: Palate elevates symmetrically. Phonation is normal. CN XI: Head turning and shoulder shrug are intact CN XII: Tongue is midline with normal  movements and no atrophy.  MOTOR: Not cooperative on examination  REFLEXES: Hypoactive and symmetric   SENSORY: Deferred  COORDINATION: Deferred   GAIT/STANCE: Deferred  DIAGNOSTIC DATA (LABS, IMAGING, TESTING) - I reviewed patient records, labs, notes, testing and imaging myself where available.   ASSESSMENT AND PLAN   65 years old African American female   Acute mental status change  Differentiation diagnosis medicine side effect, versus postictal from seizure versus superimposed infection  I have discussed with her daughter [name] over the phone, call [facility] to take her to hospital    Marcial Pacas, M.D. Ph.D.  [facility] [address], [place], [place] [address] [phone]

## 2015-10-10 NOTE — Telephone Encounter (Signed)
Left message for [name] to return our call.

## 2015-10-10 NOTE — Telephone Encounter (Signed)
I have discussed with her daughter [name], patient has not taking baclofen ER for almost a week now, she decided to withdraw from baclofen extended release research trial  Will schedule her exit visit

## 2015-10-10 NOTE — Telephone Encounter (Signed)
I spoke with [facility].  I told her that I received a message from [place] to speak with her to schedule her Mom, Wanda [name], for an Premature Treatment Visit.  [name] said that her mother is currently in an assisted living facility.  She asked if she could sign any documents or come to the visit on her Mom's behalf.  I transferred her to [place]. She stated that she would speak with Narda Bonds therapist and would call us back about when her mother would be able to travel.  Christian asked that whenever she called, to please schedule the patient.

## 2015-10-15 DIAGNOSIS — G3184 Mild cognitive impairment, so stated: Secondary | ICD-10-CM | POA: Diagnosis not present

## 2015-10-15 DIAGNOSIS — R5381 Other malaise: Secondary | ICD-10-CM | POA: Diagnosis not present

## 2015-10-15 DIAGNOSIS — R278 Other lack of coordination: Secondary | ICD-10-CM | POA: Diagnosis not present

## 2015-10-15 DIAGNOSIS — M6281 Muscle weakness (generalized): Secondary | ICD-10-CM | POA: Diagnosis not present

## 2015-10-18 DIAGNOSIS — B300 Keratoconjunctivitis due to adenovirus: Secondary | ICD-10-CM | POA: Diagnosis not present

## 2015-10-22 DIAGNOSIS — R5381 Other malaise: Secondary | ICD-10-CM | POA: Diagnosis not present

## 2015-10-22 DIAGNOSIS — M6281 Muscle weakness (generalized): Secondary | ICD-10-CM | POA: Diagnosis not present

## 2015-10-22 DIAGNOSIS — R278 Other lack of coordination: Secondary | ICD-10-CM | POA: Diagnosis not present

## 2015-10-22 DIAGNOSIS — G3184 Mild cognitive impairment, so stated: Secondary | ICD-10-CM | POA: Diagnosis not present

## 2015-10-25 DIAGNOSIS — H179 Unspecified corneal scar and opacity: Secondary | ICD-10-CM | POA: Diagnosis not present

## 2015-10-25 DIAGNOSIS — B300 Keratoconjunctivitis due to adenovirus: Secondary | ICD-10-CM | POA: Diagnosis not present

## 2015-10-28 DIAGNOSIS — F250 Schizoaffective disorder, bipolar type: Secondary | ICD-10-CM | POA: Diagnosis not present

## 2015-10-28 DIAGNOSIS — I10 Essential (primary) hypertension: Secondary | ICD-10-CM | POA: Diagnosis not present

## 2015-10-28 DIAGNOSIS — G35 Multiple sclerosis: Secondary | ICD-10-CM | POA: Diagnosis not present

## 2015-10-28 DIAGNOSIS — H3530 Unspecified macular degeneration: Secondary | ICD-10-CM | POA: Diagnosis not present

## 2015-10-28 DIAGNOSIS — G8191 Hemiplegia, unspecified affecting right dominant side: Secondary | ICD-10-CM | POA: Diagnosis not present

## 2015-10-28 DIAGNOSIS — F339 Major depressive disorder, recurrent, unspecified: Secondary | ICD-10-CM | POA: Diagnosis not present

## 2015-10-29 ENCOUNTER — Telehealth: Payer: Self-pay

## 2015-10-29 ENCOUNTER — Ambulatory Visit (INDEPENDENT_AMBULATORY_CARE_PROVIDER_SITE_OTHER): Payer: Medicare Other | Admitting: Physician Assistant

## 2015-10-29 ENCOUNTER — Encounter: Payer: Self-pay | Admitting: Physician Assistant

## 2015-10-29 VITALS — BP 120/70 | HR 80 | Temp 97.9°F | Resp 16 | Ht 60.0 in | Wt 117.4 lb

## 2015-10-29 DIAGNOSIS — F339 Major depressive disorder, recurrent, unspecified: Secondary | ICD-10-CM | POA: Diagnosis not present

## 2015-10-29 DIAGNOSIS — I10 Essential (primary) hypertension: Secondary | ICD-10-CM | POA: Diagnosis not present

## 2015-10-29 DIAGNOSIS — R404 Transient alteration of awareness: Secondary | ICD-10-CM

## 2015-10-29 DIAGNOSIS — G8191 Hemiplegia, unspecified affecting right dominant side: Secondary | ICD-10-CM | POA: Diagnosis not present

## 2015-10-29 DIAGNOSIS — G35 Multiple sclerosis: Secondary | ICD-10-CM | POA: Diagnosis not present

## 2015-10-29 DIAGNOSIS — H3530 Unspecified macular degeneration: Secondary | ICD-10-CM | POA: Diagnosis not present

## 2015-10-29 DIAGNOSIS — F250 Schizoaffective disorder, bipolar type: Secondary | ICD-10-CM | POA: Diagnosis not present

## 2015-10-29 LAB — POCT URINALYSIS DIP (MANUAL ENTRY)
BILIRUBIN UA: NEGATIVE
BILIRUBIN UA: NEGATIVE
Glucose, UA: NEGATIVE
Leukocytes, UA: NEGATIVE
Nitrite, UA: NEGATIVE
PH UA: 5.5
Protein Ur, POC: NEGATIVE
RBC UA: NEGATIVE
Spec Grav, UA: 1.02
Urobilinogen, UA: 0.2

## 2015-10-29 LAB — POC MICROSCOPIC URINALYSIS (UMFC): Mucus: ABSENT

## 2015-10-29 NOTE — Telephone Encounter (Signed)
Wanda [name] hunt from [facility] wanted to let dr Tamala Julian know that the order for social worker  Evaluation, -pt declined house visit stated that she only wanted meals on wheels, which was done  [employer] [phone]

## 2015-10-29 NOTE — Telephone Encounter (Signed)
Will forward to [place], patient's PCP.
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
## 2015-10-30 ENCOUNTER — Telehealth: Payer: Self-pay

## 2015-10-30 DIAGNOSIS — G35 Multiple sclerosis: Secondary | ICD-10-CM | POA: Diagnosis not present

## 2015-10-30 LAB — URINE CULTURE
COLONY COUNT: NO GROWTH
Organism ID, Bacteria: NO GROWTH

## 2015-10-30 NOTE — Telephone Encounter (Signed)
Today had an infusion and tomorrow eye appointment. She would like to reschedule her occupational therapy evaluation for next week instead.  Please advise Santiago Glad (Warden/ranger) for any additional questions  [phone]

## 2015-10-30 NOTE — Telephone Encounter (Signed)
Left detailed verbal orders for patient on voicemail.  

## 2015-10-30 NOTE — Telephone Encounter (Signed)
verbal order needed for treatment of giat and facial strengthening - 2 x week for 6 weeks    Champ, [place]

## 2015-10-30 NOTE — Telephone Encounter (Signed)
Please proceed with gait and ?facial? Strengthening, twice weekly x 6 weeks.

## 2015-10-30 NOTE — Telephone Encounter (Signed)
Just FYI.

## 2015-10-31 NOTE — Telephone Encounter (Signed)
Patient called, patient is upset, states when she was in the hospital recently, she made her daughter Nadara [place], states daughter has withdrawn her from the Baclofen research program for MS, related Baclofen to illness, when illness was due to UTI, daughter has overstepped her bounds in every area of her life, states she has revoked her POA. Please call [phone].

## 2015-10-31 NOTE — Telephone Encounter (Signed)
I spoke to the patient in re the [employer] study (Baclofen Study). Patient stated that she would like to withdraw from the study. A Premature Treatment Visit has been scheduled for [id number].

## 2015-10-31 NOTE — Telephone Encounter (Signed)
Noted. Also received this information electronically and by fax.

## 2015-11-01 DIAGNOSIS — H3530 Unspecified macular degeneration: Secondary | ICD-10-CM | POA: Diagnosis not present

## 2015-11-01 DIAGNOSIS — G35 Multiple sclerosis: Secondary | ICD-10-CM | POA: Diagnosis not present

## 2015-11-01 DIAGNOSIS — G8191 Hemiplegia, unspecified affecting right dominant side: Secondary | ICD-10-CM | POA: Diagnosis not present

## 2015-11-01 DIAGNOSIS — F250 Schizoaffective disorder, bipolar type: Secondary | ICD-10-CM | POA: Diagnosis not present

## 2015-11-01 DIAGNOSIS — F339 Major depressive disorder, recurrent, unspecified: Secondary | ICD-10-CM | POA: Diagnosis not present

## 2015-11-01 DIAGNOSIS — I10 Essential (primary) hypertension: Secondary | ICD-10-CM | POA: Diagnosis not present

## 2015-11-07 ENCOUNTER — Telehealth: Payer: Self-pay

## 2015-11-07 DIAGNOSIS — F250 Schizoaffective disorder, bipolar type: Secondary | ICD-10-CM | POA: Diagnosis not present

## 2015-11-07 DIAGNOSIS — I10 Essential (primary) hypertension: Secondary | ICD-10-CM | POA: Diagnosis not present

## 2015-11-07 DIAGNOSIS — F339 Major depressive disorder, recurrent, unspecified: Secondary | ICD-10-CM | POA: Diagnosis not present

## 2015-11-07 DIAGNOSIS — G8191 Hemiplegia, unspecified affecting right dominant side: Secondary | ICD-10-CM | POA: Diagnosis not present

## 2015-11-07 DIAGNOSIS — H3530 Unspecified macular degeneration: Secondary | ICD-10-CM | POA: Diagnosis not present

## 2015-11-07 DIAGNOSIS — G35 Multiple sclerosis: Secondary | ICD-10-CM | POA: Diagnosis not present

## 2015-11-07 NOTE — Telephone Encounter (Signed)
Heather from [facility] called to update [name] on Mrs. Oppermann current condition. Mrs. Service has been experiencing Flu-like symptoms since Sunday. Nira Conn is also concerned about Mrs Lemanski's safety; she has fallen a few times and is also unable to bend over to pick up items that have dropped. Mrs. Mulgrew is also NOT taking all of her prescribed medication and she is not willing to be managed. Heather informed Mrs. Ebersol daughter about this situation and was told that her daughter would take her to the emergency room when she got off of work today.  Not taking medicaton/not willing to be managed Taken to hospital to be evaluated

## 2015-11-08 DIAGNOSIS — H3530 Unspecified macular degeneration: Secondary | ICD-10-CM | POA: Diagnosis not present

## 2015-11-08 DIAGNOSIS — G8191 Hemiplegia, unspecified affecting right dominant side: Secondary | ICD-10-CM | POA: Diagnosis not present

## 2015-11-08 DIAGNOSIS — G35 Multiple sclerosis: Secondary | ICD-10-CM | POA: Diagnosis not present

## 2015-11-08 DIAGNOSIS — F250 Schizoaffective disorder, bipolar type: Secondary | ICD-10-CM | POA: Diagnosis not present

## 2015-11-08 DIAGNOSIS — I10 Essential (primary) hypertension: Secondary | ICD-10-CM | POA: Diagnosis not present

## 2015-11-08 DIAGNOSIS — F339 Major depressive disorder, recurrent, unspecified: Secondary | ICD-10-CM | POA: Diagnosis not present

## 2015-11-10 ENCOUNTER — Emergency Department (HOSPITAL_COMMUNITY): Payer: Medicare Other

## 2015-11-10 ENCOUNTER — Encounter (HOSPITAL_COMMUNITY): Payer: Self-pay | Admitting: Emergency Medicine

## 2015-11-10 ENCOUNTER — Observation Stay (HOSPITAL_COMMUNITY)
Admission: EM | Admit: 2015-11-10 | Discharge: 2015-11-14 | Disposition: A | Discharge status: Skilled Nursing Facility | Payer: Medicare Other | Attending: Internal Medicine | Admitting: Internal Medicine

## 2015-11-10 DIAGNOSIS — Z888 Allergy status to other drugs, medicaments and biological substances status: Secondary | ICD-10-CM | POA: Diagnosis not present

## 2015-11-10 DIAGNOSIS — R4182 Altered mental status, unspecified: Secondary | ICD-10-CM

## 2015-11-10 DIAGNOSIS — G35 Multiple sclerosis: Secondary | ICD-10-CM | POA: Diagnosis not present

## 2015-11-10 DIAGNOSIS — G9340 Encephalopathy, unspecified: Secondary | ICD-10-CM | POA: Diagnosis not present

## 2015-11-10 DIAGNOSIS — G4089 Other seizures: Secondary | ICD-10-CM | POA: Diagnosis not present

## 2015-11-10 DIAGNOSIS — Z9181 History of falling: Secondary | ICD-10-CM | POA: Diagnosis not present

## 2015-11-10 DIAGNOSIS — E876 Hypokalemia: Secondary | ICD-10-CM | POA: Diagnosis not present

## 2015-11-10 DIAGNOSIS — Z800 Family history of malignant neoplasm of digestive organs: Secondary | ICD-10-CM | POA: Insufficient documentation

## 2015-11-10 DIAGNOSIS — M545 Low back pain: Secondary | ICD-10-CM | POA: Diagnosis not present

## 2015-11-10 DIAGNOSIS — E785 Hyperlipidemia, unspecified: Secondary | ICD-10-CM | POA: Diagnosis not present

## 2015-11-10 DIAGNOSIS — H3530 Unspecified macular degeneration: Secondary | ICD-10-CM | POA: Diagnosis not present

## 2015-11-10 DIAGNOSIS — M1990 Unspecified osteoarthritis, unspecified site: Secondary | ICD-10-CM | POA: Insufficient documentation

## 2015-11-10 DIAGNOSIS — I10 Essential (primary) hypertension: Secondary | ICD-10-CM | POA: Insufficient documentation

## 2015-11-10 DIAGNOSIS — R410 Disorientation, unspecified: Secondary | ICD-10-CM | POA: Diagnosis not present

## 2015-11-10 DIAGNOSIS — Z87891 Personal history of nicotine dependence: Secondary | ICD-10-CM | POA: Insufficient documentation

## 2015-11-10 DIAGNOSIS — F329 Major depressive disorder, single episode, unspecified: Secondary | ICD-10-CM | POA: Insufficient documentation

## 2015-11-10 DIAGNOSIS — F05 Delirium due to known physiological condition: Principal | ICD-10-CM | POA: Insufficient documentation

## 2015-11-10 HISTORY — DX: Anemia, unspecified: D64.9

## 2015-11-10 LAB — RAPID URINE DRUG SCREEN, HOSP PERFORMED
Amphetamines: NOT DETECTED
BARBITURATES: NOT DETECTED
Benzodiazepines: NOT DETECTED
Cocaine: NOT DETECTED
OPIATES: NOT DETECTED
TETRAHYDROCANNABINOL: NOT DETECTED

## 2015-11-10 LAB — COMPREHENSIVE METABOLIC PANEL
ALT: 11 U/L — ABNORMAL LOW (ref 14–54)
AST: 14 U/L — AB (ref 15–41)
Albumin: 3.5 g/dL (ref 3.5–5.0)
Alkaline Phosphatase: 69 U/L (ref 38–126)
Anion gap: 13 (ref 5–15)
BILIRUBIN TOTAL: 0.8 mg/dL (ref 0.3–1.2)
BUN: 10 mg/dL (ref 6–20)
CO2: 24 mmol/L (ref 22–32)
CREATININE: 1 mg/dL (ref 0.44–1.00)
Calcium: 9.3 mg/dL (ref 8.9–10.3)
Chloride: 107 mmol/L (ref 101–111)
GFR, EST NON AFRICAN AMERICAN: 58 mL/min — AB (ref >60)
Glucose, Bld: 68 mg/dL (ref 65–99)
POTASSIUM: 3.3 mmol/L — AB (ref 3.5–5.1)
Sodium: 144 mmol/L (ref 135–145)
TOTAL PROTEIN: 6.6 g/dL (ref 6.5–8.1)

## 2015-11-10 LAB — CBC
HEMATOCRIT: 38.4 % (ref 36.0–46.0)
Hemoglobin: 12.8 g/dL (ref 12.0–15.0)
MCH: 27.9 pg (ref 26.0–34.0)
MCHC: 33.3 g/dL (ref 30.0–36.0)
MCV: 83.8 fL (ref 78.0–100.0)
PLATELETS: 182 10*3/uL (ref 150–400)
RBC: 4.58 MIL/uL (ref 3.87–5.11)
RDW: 15.7 % — AB (ref 11.5–15.5)
WBC: 6.6 10*3/uL (ref 4.0–10.5)

## 2015-11-10 LAB — URINALYSIS, ROUTINE W REFLEX MICROSCOPIC
GLUCOSE, UA: NEGATIVE mg/dL
HGB URINE DIPSTICK: NEGATIVE
KETONES UR: 15 mg/dL — AB
Leukocytes, UA: NEGATIVE
Nitrite: NEGATIVE
PROTEIN: NEGATIVE mg/dL
Specific Gravity, Urine: 1.02 (ref 1.005–1.030)
pH: 5.5 (ref 5.0–8.0)

## 2015-11-10 LAB — I-STAT CG4 LACTIC ACID, ED: Lactic Acid, Venous: 1.86 mmol/L (ref 0.5–2.0)

## 2015-11-10 LAB — PROTIME-INR
INR: 1.03 (ref 0.00–1.49)
Prothrombin Time: 13.7 seconds (ref 11.6–15.2)

## 2015-11-10 LAB — LIPASE, BLOOD: LIPASE: 17 U/L (ref 11–51)

## 2015-11-10 LAB — CBG MONITORING, ED: Glucose-Capillary: 72 mg/dL (ref 65–99)

## 2015-11-10 LAB — AMMONIA: AMMONIA: 42 umol/L — AB (ref 9–35)

## 2015-11-10 MED ORDER — ACETAMINOPHEN 650 MG RE SUPP: 650.0000 mg | RECTAL | Status: DC | PRN

## 2015-11-10 MED ORDER — SENNOSIDES-DOCUSATE SODIUM 8.6-50 MG PO TABS
1.0000 | ORAL_TABLET | Freq: Every evening | ORAL | Status: DC | PRN
  Filled 2015-11-10 (×2): qty 1

## 2015-11-10 MED ORDER — ACETAMINOPHEN 325 MG PO TABS: 650.0000 mg | ORAL_TABLET | ORAL | Status: DC | PRN

## 2015-11-10 MED ORDER — LAMOTRIGINE ER 100 MG PO TB24: 200.0000 mg | ORAL_TABLET | Freq: Every day | ORAL | Status: DC

## 2015-11-10 MED ORDER — ENOXAPARIN SODIUM 40 MG/0.4ML ~~LOC~~ SOLN
40.0000 mg | Freq: Every day | SUBCUTANEOUS | Status: DC
  Administered 2015-11-11 – 2015-11-14 (×3): 40 mg via SUBCUTANEOUS
  Filled 2015-11-10 (×5): qty 0.4

## 2015-11-10 MED ORDER — PREDNISOLONE ACETATE 1 % OP SUSP: 1.0000 [drp] | Freq: Four times a day (QID) | OPHTHALMIC | Status: DC

## 2015-11-10 MED ORDER — CLONAZEPAM 0.5 MG PO TABS
0.5000 mg | ORAL_TABLET | Freq: Every evening | ORAL | Status: DC | PRN
  Administered 2015-11-11: 0.5 mg via ORAL
  Filled 2015-11-10: qty 1

## 2015-11-10 MED ORDER — GATIFLOXACIN 0.5 % OP SOLN: 1.0000 [drp] | Freq: Four times a day (QID) | OPHTHALMIC | Status: DC

## 2015-11-10 MED ORDER — TRAZODONE HCL 50 MG PO TABS
50.0000 mg | ORAL_TABLET | Freq: Every day | ORAL | Status: DC
  Administered 2015-11-11 – 2015-11-13 (×3): 50 mg via ORAL
  Filled 2015-11-10 (×3): qty 1

## 2015-11-10 MED ORDER — VITAMIN D 1000 UNITS PO TABS
2000.0000 [IU] | ORAL_TABLET | Freq: Every day | ORAL | Status: DC
Start: 2015-11-11 — End: 2015-11-14
  Administered 2015-11-11 – 2015-11-14 (×4): 2000 [IU] via ORAL
  Filled 2015-11-10 (×4): qty 2

## 2015-11-10 NOTE — ED Notes (Signed)
Daughter states that pt has MS-- normally is up and about-- pt fell last night, daughter went to house to pick pt up off floor, no loss of consciousness, no head injury. Daughter went to check on pt this am, had to help pt out of bed, help get dressed, pt not talking, pt sighing, having to use bathroom to urinate more than normal.
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
## 2015-11-10 NOTE — ED Provider Notes (Signed)
CSN: [id number]     Arrival date & time 11/10/15  1357 History   First MD Initiated Contact with Patient 11/10/15 1607     Chief Complaint  Patient presents with  . Altered Mental Status  . Fall     (Consider location/radiation/quality/duration/timing/severity/associated sxs/prior Treatment) HPI   Patient is 65 year old female with history of multiple sclerosis and multiple admissions for altered mental status. She is presenting today with altered mental status. Patient had recent admission at the end of December leading into a four-week stay at rehabilitation. Patient was discharged home less than a week and half ago. Patient become increasingly altered over the last 24 hours. She's had acute encephalopathy similar to this previously.  Patient's caretaker, daughter reports no fever. In her normal state of health until yesterday. Patient's no longer on any medications causing sedation or confusion such as benzos or baclofen.  Level V caveat altered mental status.  Past Medical History  Diagnosis Date  . Elevated cholesterol   . Hypertension   . Arthritis   . Cervical dysplasia   . Depression   . Anxiety   . Macular degeneration of right eye 2001  . MS (multiple sclerosis) ([place]) 08/2013  . Acute encephalopathy 03/12/2015   Past Surgical History  Procedure Laterality Date  . Colposcopy    . Knee surgery Bilateral     "had cortisone injections in my knees"  . Cholecystectomy    . Tubal ligation    . Tonsillectomy    . Colonoscopy w/ polypectomy     Family History  Problem Relation Age of Onset  . Cancer Mother     Colon  . Diabetes Mother   . Hypertension Mother   . Arthritis Mother   . Cancer Father     prostate  . Kidney disease Brother     congenital single kidney  . Arthritis Sister   . Arthritis Sister   . Hematuria Son   . Gout Brother   . Multiple sclerosis Brother   . Arthritis Brother   . HIV Brother   . Cancer Brother     spinal   Social History   Substance Use Topics  . Smoking status: Former Smoker -- 1.00 packs/day for 45 years    Types: Cigarettes    Quit date: 10/23/2013  . Smokeless tobacco: Never Used     Comment: Quit smoking in 2014.  Marland Kitchen Alcohol Use: 0.0 oz/week    0 Standard drinks or equivalent per week     Comment: hasn't drank anything since jan 2015   OB History    Gravida Para Term Preterm AB TAB SAB Ectopic Multiple Living   6 3 3  3  3   2      Review of Systems  Constitutional: Negative for fever and activity change.  HENT: Negative for congestion.   Respiratory: Negative for cough and shortness of breath.   Cardiovascular: Negative for chest pain.  Gastrointestinal: Negative for abdominal pain.  Genitourinary: Positive for frequency.  Neurological: Negative for dizziness.  Psychiatric/Behavioral: Positive for behavioral problems and confusion.      Allergies  Baclofen  Home Medications   Prior to Admission medications   Medication Sig Start Date End Date Taking? Authorizing Provider  Cholecalciferol (VITAMIN D3) 2000 UNITS capsule Take 2,000 Units by mouth daily.   Yes Historical Provider, MD  clonazePAM (KLONOPIN) 0.5 MG tablet Take 1 tablet by mouth as needed. For sleep 10/26/15  Yes Historical Provider, MD  clonazePAM (KLONOPIN) 1 MG  tablet Take 1 tablet (1 mg total) by mouth daily as needed for anxiety. Anxiety/sleep 10/08/15  Yes Thurnell Lose, MD  gatifloxacin (ZYMAXID) 0.5 % SOLN Place 1 drop into the left eye 4 (four) times daily. For one more week 10/08/15  Yes Thurnell Lose, MD  LamoTRIgine (LAMICTAL XR) 100 MG TB24 2 tabs po qhs Patient taking differently: Take 200 mg by mouth at bedtime.  08/05/15  Yes Marcial Pacas, MD  natalizumab (TYSABRI) 300 MG/15ML injection Inject 300 mg into the vein every 30 (thirty) days.    Yes Historical Provider, MD  prednisoLONE acetate (PRED FORTE) 1 % ophthalmic suspension Place 1 drop into the left eye 4 (four) times daily. For one more week 10/08/15  Yes  Thurnell Lose, MD  traZODone (DESYREL) 50 MG tablet Take 1 tablet by mouth at bedtime. 10/24/15  Yes Historical Provider, MD  baclofen (LIORESAL) 10 MG tablet Take 1 tablet (10 mg total) by mouth at bedtime. daughter confirms 80mg  PO QHS, recommend starting 40mg  PO qhs for now and monitor. Patient not taking: Reported on 10/29/2015 10/08/15   Thurnell Lose, MD  clindamycin (CLEOCIN) 300 MG capsule Take 1 capsule (300 mg total) by mouth every 6 (six) hours. For 5 more days Patient not taking: Reported on 10/29/2015 10/08/15   Thurnell Lose, MD  modafinil (PROVIGIL) 200 MG tablet Take 1 tablet (200 mg total) by mouth daily. 10/01/15   Marcial Pacas, MD  saccharomyces boulardii (FLORASTOR) 250 MG capsule Take 1 capsule (250 mg total) by mouth 2 (two) times daily. 10/08/15   Thurnell Lose, MD   BP 129/91 mmHg  Pulse 63  Temp(Src) 97.8 F (36.6 C) (Oral)  Resp 16  Ht 4\' 11"  (1.499 m)  Wt 113 lb (51.256 kg)  BMI 22.81 kg/m2  SpO2 100% Physical Exam  Constitutional: She appears well-developed and well-nourished.  HENT:  Head: Normocephalic and atraumatic.  Eyes: Conjunctivae are normal. Right eye exhibits no discharge.  Neck: Neck supple.  Cardiovascular: Normal rate, regular rhythm and normal heart sounds.   No murmur heard. Pulmonary/Chest: Effort normal and breath sounds normal. She has no wheezes. She has no rales.  Abdominal: Soft. She exhibits no distension. There is no tenderness.  Musculoskeletal: Normal range of motion. She exhibits no edema.  Neurological: No cranial nerve deficit.  Patient with mild confusion. Unable to state name or place.  Skin: Skin is warm and dry. No rash noted. She is not diaphoretic.  Nursing note and vitals reviewed.   ED Course  Procedures (including critical care time) Labs Review Labs Reviewed  COMPREHENSIVE METABOLIC PANEL - Abnormal; Notable for the following:    Potassium 3.3 (*)    AST 14 (*)    ALT 11 (*)    GFR calc non Af Amer 58 (*)     All other components within normal limits  CBC - Abnormal; Notable for the following:    RDW 15.7 (*)    All other components within normal limits  URINALYSIS, ROUTINE W REFLEX MICROSCOPIC (NOT AT [facility]) - Abnormal; Notable for the following:    Bilirubin Urine SMALL (*)    Ketones, ur 15 (*)    All other components within normal limits  AMMONIA - Abnormal; Notable for the following:    Ammonia 42 (*)    All other components within normal limits  URINE CULTURE  PROTIME-INR  LIPASE, BLOOD  LAMOTRIGINE LEVEL  CBG MONITORING, ED  I-STAT CG4 LACTIC ACID, ED  Imaging Dg Chest 2 View  11/10/2015  CLINICAL DATA:  Altered mental status, history of MS EXAM: CHEST  2 VIEW COMPARISON:  10/04/2015 FINDINGS: Lungs are clear.  No pleural effusion or pneumothorax. The heart is top-normal in size. Mild degenerative changes of the visualized thoracolumbar spine. Cholecystectomy clips. IMPRESSION: No evidence of acute cardiopulmonary disease. Electronically Signed   By: Julian Hy M.D.   On: 11/10/2015 18:03   Ct Head Wo Contrast  11/10/2015  CLINICAL DATA:  Confusion EXAM: CT HEAD WITHOUT CONTRAST TECHNIQUE: Contiguous axial images were obtained from the base of the skull through the vertex without intravenous contrast. COMPARISON:  None. FINDINGS: There is no evidence of mass effect, midline shift, or extra-axial fluid collections. There is no evidence of a space-occupying lesion or intracranial hemorrhage. There is no evidence of a cortical-based area of acute infarction. There is generalized cerebral atrophy. There is periventricular white matter low attenuation likely secondary to microangiopathy. The ventricles and sulci are appropriate for the patient's age. The basal cisterns are patent. Visualized portions of the orbits are unremarkable. The visualized portions of the paranasal sinuses and mastoid air cells are unremarkable. Cerebrovascular atherosclerotic calcifications are noted. The  osseous structures are unremarkable. IMPRESSION: 1. No acute intracranial pathology. 2. Chronic microvascular disease and cerebral atrophy. Electronically Signed   By: Kathreen Devoid   On: 11/10/2015 17:51   I have personally reviewed and evaluated these images and lab results as part of my medical decision-making.   EKG Interpretation   Date/Time:  Sunday November 10 2015 16:20:33 EST Ventricular Rate:  61 PR Interval:  143 QRS Duration: 94 QT Interval:  425 QTC Calculation: 428 R Axis:   60 Text Interpretation:  Sinus rhythm Borderline ST elevation, inferior leads  Baseline wander in lead(s) V2 no acute ischemia No significant change  since last tracing Confirmed by Gerald Leitz ([id number]) on 11/10/2015  4:27:31 PM      MDM   Final diagnoses:  None      Patient is a 65 year old female with history of altered mental status presenting today with altered mental status. Patient has history of MS, previously on multiple sedating drugs sutch as baclofen and benzodiazepines. Patient recently discharged from rehabilitation. Patient be.came confused yesterday, increasing confusion today. On arrival to ED patient  patient unable to participate in exam     Patietn responding to external stimuli but unable to state her name  at this time. We'll do broad-spectrum workup. Patient has no fever. Otherwise we'll do urine, chest x-ray, CT brain, lab work. Patient will require admission for altered mental status.   8:19 PM Patient's AMS has resolved. She is now back to baseline.  Unsure what caused this abrupt change in mental status.  Will admit for further work up, MRI to rule out stroke.  CT normal. Ammonia only mildly elevated, no sign of acute infection.   Followed by Dr. Krista Blue by Neurology.   Haden Cavenaugh Julio Alm, MD 11/10/15 2019

## 2015-11-10 NOTE — ED Notes (Signed)
Pt given [place] sandwich and a diet ginger ale after passing swallow screen.

## 2015-11-11 ENCOUNTER — Encounter (HOSPITAL_COMMUNITY): Payer: Self-pay | Admitting: General Practice

## 2015-11-11 ENCOUNTER — Observation Stay (HOSPITAL_COMMUNITY): Payer: Medicare Other

## 2015-11-11 ENCOUNTER — Telehealth: Payer: Self-pay

## 2015-11-11 DIAGNOSIS — E876 Hypokalemia: Secondary | ICD-10-CM | POA: Diagnosis not present

## 2015-11-11 DIAGNOSIS — F05 Delirium due to known physiological condition: Secondary | ICD-10-CM

## 2015-11-11 DIAGNOSIS — R4182 Altered mental status, unspecified: Secondary | ICD-10-CM | POA: Diagnosis not present

## 2015-11-11 DIAGNOSIS — G35 Multiple sclerosis: Secondary | ICD-10-CM | POA: Diagnosis not present

## 2015-11-11 DIAGNOSIS — I10 Essential (primary) hypertension: Secondary | ICD-10-CM | POA: Diagnosis not present

## 2015-11-11 LAB — LIPID PANEL
CHOLESTEROL: 186 mg/dL (ref 0–200)
HDL: 58 mg/dL (ref >40)
LDL Cholesterol: 115 mg/dL — ABNORMAL HIGH (ref 0–99)
TRIGLYCERIDES: 65 mg/dL (ref <150)
Total CHOL/HDL Ratio: 3.2 RATIO
VLDL: 13 mg/dL (ref 0–40)

## 2015-11-11 LAB — TROPONIN I: Troponin I: <0.03 ng/mL (ref <0.031)

## 2015-11-11 LAB — URINE CULTURE: CULTURE: NO GROWTH

## 2015-11-11 LAB — TSH: TSH: 0.23 u[IU]/mL — ABNORMAL LOW (ref 0.350–4.500)

## 2015-11-11 LAB — RPR: RPR: NONREACTIVE

## 2015-11-11 LAB — T4, FREE: FREE T4: 0.86 ng/dL (ref 0.61–1.12)

## 2015-11-11 LAB — HIV ANTIBODY (ROUTINE TESTING W REFLEX): HIV SCREEN 4TH GENERATION: NONREACTIVE

## 2015-11-11 LAB — MAGNESIUM: Magnesium: 2.1 mg/dL (ref 1.7–2.4)

## 2015-11-11 LAB — VITAMIN B12: VITAMIN B 12: 2060 pg/mL — AB (ref 180–914)

## 2015-11-11 MED ORDER — POTASSIUM CHLORIDE 10 MEQ/100ML IV SOLN
10.0000 meq | INTRAVENOUS | Status: DC
  Administered 2015-11-11: 10 meq via INTRAVENOUS
  Filled 2015-11-11: qty 100

## 2015-11-11 MED ORDER — PREDNISOLONE ACETATE 1 % OP SUSP
1.0000 [drp] | Freq: Four times a day (QID) | OPHTHALMIC | Status: DC
  Administered 2015-11-11 – 2015-11-14 (×11): 1 [drp] via OPHTHALMIC
  Filled 2015-11-11: qty 1

## 2015-11-11 MED ORDER — GATIFLOXACIN 0.5 % OP SOLN
1.0000 [drp] | Freq: Four times a day (QID) | OPHTHALMIC | Status: DC
  Administered 2015-11-11 – 2015-11-14 (×11): 1 [drp] via OPHTHALMIC
  Filled 2015-11-11: qty 2.5

## 2015-11-11 MED ORDER — LAMOTRIGINE 25 MG PO TABS
100.0000 mg | ORAL_TABLET | Freq: Two times a day (BID) | ORAL | Status: DC
  Filled 2015-11-11: qty 4

## 2015-11-11 MED ORDER — LAMOTRIGINE 25 MG PO TABS
125.0000 mg | ORAL_TABLET | Freq: Two times a day (BID) | ORAL | Status: DC
Start: 2015-11-11 — End: 2015-11-14
  Administered 2015-11-11 – 2015-11-14 (×7): 125 mg via ORAL
  Filled 2015-11-11 (×6): qty 5

## 2015-11-11 NOTE — Care Management Obs Status (Signed)
[place] NOTIFICATION   Patient Details  Name: Wanda [name] MRN: [id number] Date of Birth: 1951-06-22   Medicare Observation Status Notification Given:  Yes    Carles Collet, RN 11/11/2015, 2:38 PM

## 2015-11-11 NOTE — Telephone Encounter (Signed)
Telephone call from patient's occupational therapist for home health therapy.  Santiago Glad)   She needs an order for 1xwk for 2-3 weeks then 2xwk. Karen's phone number is [phone]

## 2015-11-11 NOTE — Progress Notes (Signed)
Occupational Therapy Evaluation Patient Details Name: Wanda [name] MRN: [id number] DOB: [birth year] Today's Date: 11/11/2015    History of Present Illness Wanda [name] is an 65 y.o. female with PMH of hypertension, hyperlipidemia, and relapsing remitting type multiple sclerosis who presents to the ED after EMS was activated by her daughter for recurrent falls and confusional state. Per chart review - Patient had been admitted here in December 2016 under similar circumstances and was eventually discharged to SNF where she rehabilitated for 4 weeks prior to returning home approximately 10 days ago. Initially, upon her return home from rehabilitation, she was ambulating on her own and performing most of her ADLs independently. Over the ensuing week and a half however, her daughter reports multiple falls, stating that this is typical for the patient. The day prior to her presentation, the patient called her daughter, reporting that she had fallen and needed help getting up. When her daughter arrived, the smoke alarm was going off and chicken was burning in the oven, with the patient lying on the floor unable to get up on her own. This morning, when the patient's daughter went to check on her, the patient was essentially nonverbal and unable to get out of bed on her own. There has been no evidence of head injury or significant trauma per report of the patient's daughter and there is been no recent fevers, chills, cough, nausea, vomiting, or diarrhea. The last time that the patient had a similar presentation, she is noted to have a UTI and this was thought to account for her symptoms. Her daughter assumed that this was the case again and activated EMS for transport to the hospital.    Clinical Impression   PTA, pt living at home as stated above with daughter intermittently checking on her. At this time, pt requires Min A for mobility @ RW level and max A with hygiene after toileting. Pt with apparent  cognitive deficits. Confused at times throughout eval. Pt is NOT safe to D/C home without 24/7 S. Per PT note, daughter is not able to provide 24/7 S. Feel pt is appropriate for rehab at SNF and long term more appropriate for ALF. Will follow acutely to address established goals.     Follow Up Recommendations  SNF;Supervision/Assistance - 24 hour    Equipment Recommendations  None recommended by OT    Recommendations for Other Services       Precautions / Restrictions Precautions Precautions: Fall Restrictions Weight Bearing Restrictions: No      Mobility Bed Mobility Overal bed mobility: Needs Assistance Bed Mobility: Supine to Sit     Supine to sit: Min guard     General bed mobility comments: heavy use of rails  Transfers Overall transfer level: Needs assistance Equipment used: Rolling walker (2 wheeled) Transfers: Sit to/from [facility] Sit to Stand: Min guard Stand pivot transfers: Min assist       General transfer comment: unsafe during mobility. Reaching for moving table to stabilize self.     Balance Overall balance assessment: Needs assistance;History of Falls Sitting-balance support: No upper extremity supported;Feet supported Sitting balance-Leahy Scale: Fair     Standing balance support: Bilateral upper extremity supported;During functional activity Standing balance-Leahy Scale: Poor Standing balance comment: relies on Bil UE support for static stability.                            ADL Overall ADL's : Needs assistance/impaired Eating/Feeding: Set  up;Supervision/ safety Eating/Feeding Details (indicate cue type and reason): needs built up tubing for utensils Grooming: Set up   Upper Body Bathing: Set up   Lower Body Bathing: Minimal assistance;Sit to/from stand   Upper Body Dressing : Set up   Lower Body Dressing: Minimal assistance;Sit to/from stand   Toilet Transfer: Minimal assistance   Toileting- Clothing  Manipulation and Hygiene: Maximal assistance Toileting - Clothing Manipulation Details (indicate cue type and reason): inconrinenet of BM. Max A to clean up. States she ususally wears depends     Functional mobility during ADLs: Minimal assistance;Rolling walker General ADL Comments: refused to get to [facility]. Incontinent in bed. Had participated with PT earlier with no recollection of session. Pt unsafe wtih ADL. High fall risk. Confused at times during session. Unable to problem solve how to use call bell to turn off TV. Kept trying to use telephone to call nurse by pressing phone on/off button  Reports story of falling in the kitchen when she was trying to cook chicken because her legs were so weak. States she was unable to get up and had to crawl to the couch in order to get up but was unable to do so. Tangential conversation about her black dog being afraid of her and unable to help her stand.  States her daughter has stopped her Baclofen because it makes her sleepy. Pt states she will start it back herself.      Vision Additional Comments: need to further assess   Perception     Praxis      Pertinent Vitals/Pain Pain Assessment: No/denies pain     Hand Dominance Right   Extremity/Trunk Assessment Upper Extremity Assessment Upper Extremity Assessment: RUE deficits/detail;LUE deficits/detail RUE Deficits / Details: gross grasp/release. difficulty with in hand manipulation skills RUE Sensation: decreased light touch RUE Coordination: decreased fine motor LUE Deficits / Details: Decreaed coordination/ gross grasp/release. Better control with L hand. Difficulty maintaining grasp on utensils LUE Sensation: decreased light touch LUE Coordination: decreased fine motor    increased spasticity  RLE. Cervical / Trunk Assessment Cervical / Trunk Assessment: Kyphotic   Communication Communication Communication: No difficulties   Cognition Arousal/Alertness: Awake/alert Behavior During  Therapy: Flat affect Overall Cognitive Status: History of cognitive impairments - at baseline Area of Impairment: Safety/judgement;Problem solving;Following commands     Memory: Decreased short-term memory;Decreased recall of precautions Following Commands: Follows one step commands with increased time Safety/Judgement: Decreased awareness of safety;Decreased awareness of deficits   Problem Solving: Slow processing;Requires verbal cues;Requires tactile cues     General Comments       Exercises Exercises: General Lower Extremity     Shoulder Instructions      Home Living Family/patient expects to be discharged to:: Private residence Living Arrangements: Alone Available Help at Discharge: Family;Available PRN/intermittently Type of Home: House Home Access: Level entry     Home Layout: One level     Bathroom Shower/Tub: Teacher, early years/pre: Standard     Home Equipment: Tub bench;Bedside commode;Walker - 2 wheels;Walker - standard;Cane - quad;Transport chair   Additional Comments: Pt was living alone.  At first pt states she can go stay with daughter but later in conversation states she isnt' going to go live with daughter.  Pt asked to call daughter and PT talked to pt daughter.  Daughter initially said that she would take pt home but then as we talked she stated that pt will not have 24 hour care and that pt is so impaired  that she really thinks a SNf would be best.  She said pt is so confused she doesnt' feel she can deal with this at home.        Prior Functioning/Environment Level of Independence: Needs assistance  Gait / Transfers Assistance Needed: walks with RW independently, daughter assists with getting RLE into bed as needed ADL's / Homemaking Assistance Needed: daughter assists with bathing and dressing; uses BSC in shower   Comments: Pt reports she falls often.  Recently fell backward wihile vacuming as she stepped wrong per pt.  Also she fell out  of bed.  Pt has all falls as she is not using her equipment as instructed.      OT Diagnosis: Generalized weakness;Cognitive deficits   OT Problem List: Decreased strength;Decreased range of motion;Decreased activity tolerance;Impaired balance (sitting and/or standing);Decreased coordination;Decreased cognition;Decreased safety awareness;Decreased knowledge of use of DME or AE;Impaired sensation;Impaired tone;Impaired UE functional use   OT Treatment/Interventions: Self-care/ADL training;Therapeutic exercise;Neuromuscular education;DME and/or AE instruction;Therapeutic activities;Cognitive remediation/compensation;Patient/family education;Balance training    OT Goals(Current goals can be found in the care plan section) Acute Rehab OT Goals Patient Stated Goal: to go home OT Goal Formulation: Patient unable to participate in goal setting Time For Goal Achievement: 11/18/15 Potential to Achieve Goals: Good ADL Goals Pt Will Perform Grooming: with modified independence;standing Pt Will Perform Lower Body Bathing: with modified independence;sit to/from stand Pt Will Transfer to Toilet: with modified independence;bedside commode;stand pivot transfer Pt Will Perform Toileting - Clothing Manipulation and hygiene: with modified independence;sitting/lateral leans  OT Frequency: Min 2X/week   Barriers to D/C: Decreased caregiver support  Daughter states 24/7 S is not available.       Co-evaluation              End of Session Equipment Utilized During Treatment: Gait belt;Rolling walker Nurse Communication: Mobility status  Activity Tolerance: Patient tolerated treatment well Patient left: in chair;with call bell/phone within reach;with chair alarm set   Time: [id number] OT Time Calculation (min): 33 min Charges:  OT General Charges $OT Visit: 1 Procedure OT Evaluation $OT Eval Moderate Complexity: 1 Procedure OT Treatments $Self Care/Home Management : 8-22 mins G-Codes: OT  G-codes **NOT FOR INPATIENT CLASS** Functional Assessment Tool Used: clinical judgement Functional Limitation: Self care Self Care Current Status [id number]): At least 60 percent but less than 80 percent impaired, limited or restricted Self Care Goal Status [id number]): At least 1 percent but less than 20 percent impaired, limited or restricted  Makoto Sellitto,HILLARY 11/11/2015, 2:09 PM   [facility], OTR/L  [phone] 11/11/2015

## 2015-11-11 NOTE — Evaluation (Signed)
Physical Therapy Evaluation Patient Details Name: Wanda [name] MRN: [id number] DOB: Aug 31, 1951 Today's Date: 11/11/2015   History of Present Illness  Wanda [name] is an 65 y.o. female with PMH of hypertension, hyperlipidemia, and relapsing remitting type multiple sclerosis who presents to the ED after EMS was activated by her daughter for recurrent falls and confusional state. Patient had been admitted here in December 2016 under similar circumstances and was eventually discharged to SNF where she rehabilitated for 4 weeks prior to returning home approximately 10 days ago. Initially, upon her return home from rehabilitation, she was ambulating on her own and performing most of her ADLs independently. Over the ensuing week and a half however, her daughter reports multiple falls, stating that this is typical for the patient. The day prior to her presentation, the patient called her daughter, reporting that she had fallen and needed help getting up. When her daughter arrived, the smoke alarm was going off and chicken was burning in the oven, with the patient lying on the floor unable to get up on her own. This morning, when the patient's daughter went to check on her, the patient was essentially nonverbal and unable to get out of bed on her own. There has been no evidence of head injury or significant trauma per report of the patient's daughter and there is been no recent fevers, chills, cough, nausea, vomiting, or diarrhea. The last time that the patient had a similar presentation, she is noted to have a UTI and this was thought to account for her symptoms. Her daughter assumed that this was the case again and activated EMS for transport to the hospital.   Clinical Impression  Pt admitted with above diagnosis. Pt currently with functional limitations due to the deficits listed below (see PT Problem List). Pt still with confusion at times as well as poor safety awareness with right LE weakness limiting safe  function.  Pt wants to go home but even stated initially she would go with to daughters home.  Then later in conversation, pt stated she was going home no matter what.  Spoke with daughter on the phone who states she cannot provide 24 hour care.  Daughter concerned as pt status varies with intermittent confusion as well as poor safety awareness. Daughter in agreement with SNF.  Feel that pt may benefit from A living after a SNF stay as she will most likely continue to need some assistance for safety.  Will follow acutely.   Updated SW regarding d/c planning.  Pt will benefit from skilled PT to increase their independence and safety with mobility to allow discharge to the venue listed below.    Follow Up Recommendations SNF;Supervision/Assistance - 24 hour    Equipment Recommendations  None recommended by PT    Recommendations for Other Services       Precautions / Restrictions Precautions Precautions: Fall Restrictions Weight Bearing Restrictions: No      Mobility  Bed Mobility Overal bed mobility: Needs Assistance Bed Mobility: Supine to Sit     Supine to sit: Min guard     General bed mobility comments: Pt was able to get to EOb without assist.  Pt stated she needed to use bathroom bad as she slid out to EOB and then stated,"Oh It is coming."  Assisted pt to EOB and she did go some in bed but got her to the 3N1 to finish her BM.   Transfers Overall transfer level: Needs assistance Equipment used: Rolling walker (2 wheeled)  Transfers: Sit to/from [employer] to Stand: Min guard Stand pivot transfers: Min assist       General transfer comment: Pt was able to step stand pivot using RW to get onto the 3N1.  Needed total assist to clean pt as she needed bil UE support to stand statically.   Ambulation/Gait Ambulation/Gait assistance: Min assist Ambulation Distance (Feet): 75 Feet Assistive device: Rolling walker (2 wheeled) Gait Pattern/deviations: Step-to  pattern;Decreased step length - right;Decreased dorsiflexion - right;Decreased stance time - right;Staggering left;Staggering right;Drifts right/left;Wide base of support   Gait velocity interpretation: <1.8 ft/sec, indicative of risk for recurrent falls General Gait Details: Pt was able to ambulate with RW with cues for sequencing steps and RW.  Pt constantly needed cues to step right LE forward as she was dragging it at times.  Pt did not take equal step lengths and needed cues to stay close to RW.  Pt with poor safety awareness overall with ambulation.    Stairs            Wheelchair Mobility    Modified Rankin (Stroke Patients Only)       Balance Overall balance assessment: Needs assistance;History of Falls Sitting-balance support: No upper extremity supported;Feet supported Sitting balance-Leahy Scale: Fair     Standing balance support: Bilateral upper extremity supported;During functional activity Standing balance-Leahy Scale: Poor Standing balance comment: relies on Bil UE support for static stability.                             Pertinent Vitals/Pain Pain Assessment: No/denies pain  VSS    Home Living Family/patient expects to be discharged to:: Private residence Living Arrangements: Alone Available Help at Discharge: Family;Available PRN/intermittently Type of Home: House Home Access: Level entry     Home Layout: One level Home Equipment: Tub bench;Bedside commode;Walker - 2 wheels;Walker - standard;Cane - quad;Transport chair Additional Comments: Pt was living alone.  At first pt states she can go stay with daughter but later in conversation states she isnt' going to go live with daughter.  Pt asked to call daughter and PT talked to pt daughter.  Daughter initially said that she would take pt home but then as we talked she stated that pt will not have 24 hour care and that pt is so impaired that she really thinks a SNf would be best.  She said pt is  so confused she doesnt' feel she can deal with this at home.      Prior Function Level of Independence: Needs assistance   Gait / Transfers Assistance Needed: walks with RW independently, daughter assists with getting RLE into bed as needed  ADL's / Homemaking Assistance Needed: daughter assists with bathing and dressing; uses BSC in shower  Comments: Pt reports she falls often.  Recently fell backward wihile vacuming as she stepped wrong per pt.  Also she fell out of bed.  Pt has all falls as she is not using her equipment as instructed.       Hand Dominance   Dominant Hand: Right    Extremity/Trunk Assessment   Upper Extremity Assessment: Defer to OT evaluation           Lower Extremity Assessment: RLE deficits/detail;LLE deficits/detail RLE Deficits / Details: hip 2/5, knee 3/5, ankle 3/5 LLE Deficits / Details: hip 3-/5, knee 4/5, ankle 3+/5  Cervical / Trunk Assessment: Kyphotic  Communication   Communication: No difficulties  Cognition Arousal/Alertness: Awake/alert  Behavior During Therapy: Flat affect Overall Cognitive Status: History of cognitive impairments - at baseline Area of Impairment: Safety/judgement;Problem solving;Following commands     Memory: Decreased short-term memory;Decreased recall of precautions Following Commands: Follows one step commands with increased time Safety/Judgement: Decreased awareness of safety;Decreased awareness of deficits   Problem Solving: Slow processing;Requires verbal cues;Requires tactile cues      General Comments General comments (skin integrity, edema, etc.): Pt c/o soreness in right calf limiting function.  States if someone would just rub her leg all the time she could do better.  Pt instructed that she can rub her own leg and pt tried and she is able to reach it.      Exercises General Exercises - Lower Extremity Quad Sets: AROM;Both;10 reps;Supine Heel Slides: AROM;Both;10 reps;Supine      Assessment/Plan     PT Assessment Patient needs continued PT services  PT Diagnosis Generalized weakness   PT Problem List Decreased activity tolerance;Decreased balance;Decreased mobility;Decreased knowledge of use of DME;Decreased safety awareness;Decreased strength;Decreased cognition;Decreased knowledge of precautions  PT Treatment Interventions DME instruction;Gait training;Stair training;Functional mobility training;Therapeutic activities;Therapeutic exercise;Balance training;Patient/family education;Cognitive remediation   PT Goals (Current goals can be found in the Care Plan section) Acute Rehab PT Goals Patient Stated Goal: to get better PT Goal Formulation: With patient Time For Goal Achievement: 11/25/15 Potential to Achieve Goals: Good    Frequency Min 3X/week   Barriers to discharge Decreased caregiver support (daughter works and cannot provide 24 hour care)      Co-evaluation               End of Session Equipment Utilized During Treatment: Gait belt Activity Tolerance: Patient limited by fatigue Patient left: in chair;with call bell/phone within reach;with chair alarm set Nurse Communication: Mobility status    Functional Assessment Tool Used: clinical judgment Functional Limitation: Mobility: Walking and moving around Mobility: Walking and Moving Around Current Status [phone]): At least 20 percent but less than 40 percent impaired, limited or restricted Mobility: Walking and Moving Around Goal Status [phone]): At least 1 percent but less than 20 percent impaired, limited or restricted    Time: 0950-1030 PT Time Calculation (min) (ACUTE ONLY): 40 min   Charges:   PT Evaluation $PT Eval Moderate Complexity: 1 Procedure PT Treatments $Gait Training: 8-22 mins $Self Care/Home Management: 8-22   PT G Codes:   PT G-Codes **NOT FOR INPATIENT CLASS** Functional Assessment Tool Used: clinical judgment Functional Limitation: Mobility: Walking and moving around Mobility: Walking  and Moving Around Current Status [id number]): At least 20 percent but less than 40 percent impaired, limited or restricted Mobility: Walking and Moving Around Goal Status [phone]): At least 1 percent but less than 20 percent impaired, limited or restricted    Denice Paradise 11/11/2015, 12:28 PM Charmel Pronovost,PT Acute Rehabilitation [phone] (pager)

## 2015-11-11 NOTE — Telephone Encounter (Signed)
Yes; please give verbal to proceed with home health therapy.

## 2015-11-11 NOTE — Progress Notes (Signed)
VIANY KAMAI [id number] Admission Data: 11/11/2015 9:02 AM Attending Provider: Vianne Bulls, MD  [id number], PA-C Consults/ Treatment Team:    Wanda [name] is a 65 y.o. female patient admitted from ED awake, alert  & orientated  X 3,  Full Code, VSS - Blood pressure 146/83, pulse 75, temperature 98.3 F (36.8 C), temperature source Oral, resp. rate 16, height 4\' 11"  (1.499 m), weight 51.256 kg (113 lb), SpO2 100 %., room air, no c/o shortness of breath, no c/o chest pain, no distress noted.   IV site WDL:  Right hand with a transparent dsg that's clean dry and intact. running IVF at [place]:   Allergies  Allergen Reactions  . Baclofen     Confusion, muscle weakness, lethargy     Past Medical History  Diagnosis Date  . Elevated cholesterol   . Hypertension   . Arthritis   . Cervical dysplasia   . Depression   . Anxiety   . Macular degeneration of right eye 2001  . MS (multiple sclerosis) ([place]) 08/2013  . Acute encephalopathy 03/12/2015  . Anemia    Pt orientation to unit, room and routine. Information packet given to patient/family and safety video watched.  Admission INP armband ID verified with patient/family, and in place. SR up x 2, fall risk assessment complete with Patient and family verbalizing understanding of risks associated with falls. Pt verbalizes an understanding of how to use the call bell and to call for help before getting out of bed.  Skin, clean-dry- intact without evidence of bruising, or skin tears.  No evidence of skin break down noted on exam.Skin assessment done with Zenon Mayo RN.   Will cont to monitor and assist as needed.  Dorita Fray, RN 11/11/2015 11:19 AM

## 2015-11-11 NOTE — Progress Notes (Signed)
PROGRESS NOTE  Wanda [name] [id number] DOB: March 08, 1951 DOA: 11/10/2015 PCP: JEFFERY,[name], PA-C  Assessment/Plan: Acute encephalopathy, unknown etiology  - Uncertain etiology; had very similar presentation here recently from which she recovered spontaneously without clear diagnosis  - No suggestion of infectious process; UA reassuring, CXR clear  - MRI brain- no new CVa  RPR pending thyroid studies- Ft4 ok, TSH low  B12 ok folate levels pending  ammonia slightly hight - Possibly a psychosocial component vs seizures  Multiple sclerosis  - Relapsing-remitting type, followed by Dr. Krista Blue outpt  - Has been undergoing treatment with Tysarbi infusion qMonth  - Previously on baclofen, but discontinued since last admission  - MRI brain as below -PT eval   Hypokalemia  - Mild, uncertain etiology  - check Mg -BMp in AM  Hypertension  - Normotensive here so far  - Watching off medications for now, will treat prn   Code Status: full Family Communication:patient Disposition Plan:    Consultants:  neuro  Procedures:      HPI/Subjective: C/o some back pain  Objective: Filed Vitals:   11/11/15 0818 11/11/15 0852  BP: 140/61 146/83  Pulse: 65 75  Temp: 98.3 F (36.8 C)   Resp: 16     Intake/Output Summary (Last 24 hours) at 11/11/15 1026 Last data filed at 11/10/15 1645  Gross per 24 hour  Intake      0 ml  Output     20 ml  Net    -20 ml   Filed Weights   11/10/15 1455  Weight: 51.256 kg (113 lb)    Exam:   General:  Awake, NAD  Cardiovascular: rrr  Respiratory: clear  Abdomen: +BS, soft  Musculoskeletal: weak b/l   Data Reviewed: Basic Metabolic Panel:  Recent Labs Lab 11/10/15 1901  NA 144  K 3.3*  CL 107  CO2 24  GLUCOSE 68  BUN 10  CREATININE 1.00  CALCIUM 9.3   Liver Function Tests:  Recent Labs Lab 11/10/15 1901  AST 14*  ALT 11*  ALKPHOS 69  BILITOT 0.8  PROT 6.6  ALBUMIN 3.5    Recent Labs Lab  11/10/15 1901  LIPASE 17    Recent Labs Lab 11/10/15 1710  AMMONIA 42*   CBC:  Recent Labs Lab 11/10/15 1710  WBC 6.6  HGB 12.8  HCT 38.4  MCV 83.8  PLT 182   Cardiac Enzymes:  Recent Labs Lab 11/11/15 0020  TROPONINI <0.03   BNP (last 3 results) No results for input(s): BNP in the last 8760 hours.  ProBNP (last 3 results) No results for input(s): PROBNP in the last 8760 hours.  CBG:  Recent Labs Lab 11/10/15 1511  GLUCAP 72    No results found for this or any previous visit (from the past 240 hour(s)).   Studies: Dg Chest 2 View  11/10/2015  CLINICAL DATA:  Altered mental status, history of MS EXAM: CHEST  2 VIEW COMPARISON:  10/04/2015 FINDINGS: Lungs are clear.  No pleural effusion or pneumothorax. The heart is top-normal in size. Mild degenerative changes of the visualized thoracolumbar spine. Cholecystectomy clips. IMPRESSION: No evidence of acute cardiopulmonary disease. Electronically Signed   By: Julian Hy M.D.   On: 11/10/2015 18:03   Ct Head Wo Contrast  11/10/2015  CLINICAL DATA:  Confusion EXAM: CT HEAD WITHOUT CONTRAST TECHNIQUE: Contiguous axial images were obtained from the base of the skull through the vertex without intravenous contrast. COMPARISON:  None. FINDINGS: There is no evidence of mass  effect, midline shift, or extra-axial fluid collections. There is no evidence of a space-occupying lesion or intracranial hemorrhage. There is no evidence of a cortical-based area of acute infarction. There is generalized cerebral atrophy. There is periventricular white matter low attenuation likely secondary to microangiopathy. The ventricles and sulci are appropriate for the patient's age. The basal cisterns are patent. Visualized portions of the orbits are unremarkable. The visualized portions of the paranasal sinuses and mastoid air cells are unremarkable. Cerebrovascular atherosclerotic calcifications are noted. The osseous structures are  unremarkable. IMPRESSION: 1. No acute intracranial pathology. 2. Chronic microvascular disease and cerebral atrophy. Electronically Signed   By: Kathreen Devoid   On: 11/10/2015 17:51   Mr Brain Wo Contrast  11/11/2015  CLINICAL DATA:  Altered mental status, discharged from rehabilitation less than 10 days ago. Acute encephalopathy, similar prior episodes. History of multiple sclerosis. EXAM: MRI HEAD WITHOUT CONTRAST TECHNIQUE: Multiplanar, multiecho pulse sequences of the brain and surrounding structures were obtained without intravenous contrast. COMPARISON:  CT head September 08, 2016 and MRI head October 04, 2015 FINDINGS: Moderate ventriculomegaly on the basis of global parenchymal brain volume loss, advanced for age of stable from prior imaging. Patchy pontine and confluent supratentorial white matter T2 hyperintensities in addition to cystic periventricular lesions with low T1 signal consistent with black holes of demyelination. Stable lesion RIGHT basal ganglia. Subcentimeter LEFT subependymal lesion with crescentic susceptibility artifact is unchanged, nonspecific. Few additional punctate foci of susceptibility artifact, nonspecific. No abnormal extra-axial fluid collections. Normal major intracranial vascular flow voids present at the skull base. Ocular globes and orbital contents appear normal though, not tailored for assessment of optic neuritis. Paranasal sinuses and mastoid air cells are well aerated. No abnormal sellar expansion. No cerebellar tonsillar ectopia. No abnormal calvarial bone marrow signal. No abnormal mass lesions, mass effect. No reduced diffusion to suggest acute ischemia nor hyperacute demyelination. IMPRESSION: No acute intracranial process. Stable appearance of the brain: Advanced white matter changes compatible with chronic demyelination and moderate global parenchymal brain volume loss, advanced for age. Stable subcentimeter hemorrhagic periatrial subependymal nodule.  Electronically Signed   By: Elon Alas M.D.   On: 11/11/2015 01:46    Scheduled Meds: . cholecalciferol  2,000 Units Oral Daily  . enoxaparin (LOVENOX) injection  40 mg Subcutaneous Daily  . lamoTRIgine  100 mg Oral BID  . traZODone  50 mg Oral QHS   Continuous Infusions:  Antibiotics Given (last 72 hours)    None      Active Problems:   Hypertension   Multiple sclerosis ([place])   Hypokalemia   Acute confusional state   Acute encephalopathy    Time spent: 25 min    [facility] Pager [phone]. If 7PM-7AM, please contact night-coverage at [email or web address], password [facility] 11/11/2015, 10:26 AM

## 2015-11-11 NOTE — ED Notes (Signed)
Attempted report 

## 2015-11-11 NOTE — Consult Note (Signed)
NEURO HOSPITALIST CONSULT NOTE   Requestig physician: Dr. Myna Hidalgo   Reason for Consult: AMS  HPI:                                                                                                                                          Wanda [name] is an 65 y.o. female with PMH of hypertension, hyperlipidemia, and relapsing remitting type multiple sclerosis who presents to the ED after EMS was activated by her daughter for recurrent falls and confusional state. Patient had been admitted here in December 2016 under similar circumstances and was eventually discharged to SNF where she rehabilitated for 4 weeks prior to returning home approximately 10 days ago. Initially, upon her return home from rehabilitation, she was ambulating on her own and performing most of her ADLs independently. Over the ensuing week and a half however, her daughter reports multiple falls, stating that this is typical for the patient. The day prior to her presentation, the patient called her daughter, reporting that she had fallen and needed help getting up. When her daughter arrived, the smoke alarm was going off and chicken was burning in the oven, with the patient lying on the floor unable to get up on her own. This morning, when the patient's daughter went to check on her, the patient was essentially nonverbal and unable to get out of bed on her own. There has been no evidence of head injury or significant trauma per report of the patient's daughter and there is been no recent fevers, chills, cough, nausea, vomiting, or diarrhea. The last time that the patient had a similar presentation, she is noted to have a UTI and this was thought to account for her symptoms. Her daughter assumed that this was the case again and activated EMS for transport to the hospital.   In ED, patient was found to be afebrile, saturating well on room air, and with vital signs stable. ED personnel report choreiform movements upon her  arrival, mainly involving the face and trunk, and upper extremities to a lesser degree. She was reportedly sighing and appeared alert, but not verbally communicative. EKG featured a sinus rhythm without changes from prior studies. Noncontrast head CT was negative for acute intracranial abnormality. There is no acute cardiopulmonary disease noted on chest x-ray. Urinalysis does not suggest infection, CBC is within the normal limits, and chem panel is notable only for a mild hypokalemia. Ammonia is mildly elevated to a value of 42.   In discussing with patient she was on Baclofen and was doing really well but recently stopped this due to daughter not wanting her to be on this. Patient states she is drowsy when on Baclofen and will intermittently take the Baclofen at home even though she is not to be taking  it. Question if the confusion is due to intermittent administration of Baclofen. She admits to taking Baclofen when daughter is not around.  This would make sense with the confusion of patient after daughter returned home.   Past Medical History  Diagnosis Date  . Elevated cholesterol   . Hypertension   . Arthritis   . Cervical dysplasia   . Depression   . Anxiety   . Macular degeneration of right eye 2001  . MS (multiple sclerosis) ([place]) 08/2013  . Acute encephalopathy 03/12/2015  . Anemia     Past Surgical History  Procedure Laterality Date  . Colposcopy    . Knee surgery Bilateral     "had cortisone injections in my knees"  . Cholecystectomy    . Tubal ligation    . Tonsillectomy    . Colonoscopy w/ polypectomy      Family History  Problem Relation Age of Onset  . Cancer Mother     Colon  . Diabetes Mother   . Hypertension Mother   . Arthritis Mother   . Cancer Father     prostate  . Kidney disease Brother     congenital single kidney  . Arthritis Sister   . Arthritis Sister   . Hematuria Son   . Gout Brother   . Multiple sclerosis Brother   . Arthritis Brother   . HIV  Brother   . Cancer Brother     spinal     Social History:  reports that she quit smoking about 2 years ago. Her smoking use included Cigarettes. She has a 45 pack-year smoking history. She has never used smokeless tobacco. She reports that she drinks alcohol. She reports that she does not use illicit drugs.  Allergies  Allergen Reactions  . Baclofen     Confusion, muscle weakness, lethargy    MEDICATIONS:                                                                                                                     Scheduled: . cholecalciferol  2,000 Units Oral Daily  . enoxaparin (LOVENOX) injection  40 mg Subcutaneous Daily  . lamoTRIgine  125 mg Oral BID  . traZODone  50 mg Oral QHS     ROS:  History obtained from the patient  General ROS: negative for - chills, fatigue, fever, night sweats, weight gain or weight loss Psychological ROS: negative for - behavioral disorder, hallucinations, memory difficulties, mood swings or suicidal ideation Ophthalmic ROS: negative for - blurry vision, double vision, eye pain or loss of vision ENT ROS: negative for - epistaxis, nasal discharge, oral lesions, sore throat, tinnitus or vertigo Allergy and Immunology ROS: negative for - hives or itchy/watery eyes Hematological and Lymphatic ROS: negative for - bleeding problems, bruising or swollen lymph nodes Endocrine ROS: negative for - galactorrhea, hair pattern changes, polydipsia/polyuria or temperature intolerance Respiratory ROS: negative for - cough, hemoptysis, shortness of breath or wheezing Cardiovascular ROS: negative for - chest pain, dyspnea on exertion, edema or irregular heartbeat Gastrointestinal ROS: negative for - abdominal pain, diarrhea, hematemesis, nausea/vomiting or stool incontinence Genito-Urinary ROS: negative for - dysuria, hematuria,  incontinence or urinary frequency/urgency Musculoskeletal ROS: negative for - joint swelling or muscular weakness Neurological ROS: as noted in HPI Dermatological ROS: negative for rash and skin lesion changes   Blood pressure 146/83, pulse 75, temperature 98.3 F (36.8 C), temperature source Oral, resp. rate 16, height 4\' 11"  (1.499 m), weight 51.256 kg (113 lb), SpO2 100 %.   Neurologic Examination:                                                                                                      HEENT-  Normocephalic, no lesions, without obvious abnormality.  Normal external eye and conjunctiva.  Normal TM's bilaterally.  Normal auditory canals and external ears. Normal external nose, mucus membranes and septum.  Normal pharynx. Cardiovascular- S1, S2 normal, pulses palpable throughout   Lungs- chest clear, no wheezing, rales, normal symmetric air entry Abdomen- normal findings: no bruits heard Extremities- less then 2 second capillary refill Lymph-no adenopathy palpable Musculoskeletal-no joint tenderness, deformity or swelling Skin-warm and dry, no hyperpigmentation, vitiligo, or suspicious lesions  Neurological Examination Mental Status: Alert, oriented, thought content appropriate. Mild dysarthria. Speech fluent without evidence of aphasia.  Able to follow 3 step commands without difficulty. Cranial Nerves: II: pupils equal, round, reactive to light and accommodation III,IV, VI: ptosis not present, extra-ocular motions intact bilaterally V,VII: smile symmetric, facial light touch sensation normal bilaterally VIII: hearing normal bilaterally IX,X: uvula rises symmetrically XI: bilateral shoulder shrug XII: midline tongue extension Motor: Refuses formal testing, appears to move RUE less briskly compared to left Tone and bulk:normal tone throughout; no atrophy noted Sensory: Pinprick and light touch intact throughout, bilaterally Deep Tendon Reflexes: 2+ and symmetric  throughout Plantars: Right: downgoing   Left: downgoing Cerebellar: refuses Gait: refuses      Lab Results: Basic Metabolic Panel:  Recent Labs Lab 11/10/15 1901  NA 144  K 3.3*  CL 107  CO2 24  GLUCOSE 68  BUN 10  CREATININE 1.00  CALCIUM 9.3    Liver Function Tests:  Recent Labs Lab 11/10/15 1901  AST 14*  ALT 11*  ALKPHOS 69  BILITOT 0.8  PROT 6.6  ALBUMIN 3.5    Recent Labs Lab 11/10/15 1901  LIPASE 17    Recent Labs Lab 11/10/15 1710  AMMONIA 42*    CBC:  Recent Labs Lab 11/10/15 1710  WBC 6.6  HGB 12.8  HCT 38.4  MCV 83.8  PLT 182    Cardiac Enzymes:  Recent Labs Lab 11/11/15 0020  TROPONINI <0.03    Lipid Panel:  Recent Labs Lab 11/11/15 0601  CHOL 186  TRIG 65  HDL 58  CHOLHDL 3.2  VLDL 13  LDLCALC 115*    CBG:  Recent Labs Lab 11/10/15 1511  GLUCAP 72    Microbiology: Results for orders placed or performed in visit on 10/29/15  Urine culture     Status: None   Collection Time: 10/29/15 10:03 AM  Result Value Ref Range Status   Colony Count NO GROWTH  Final   Organism ID, Bacteria NO GROWTH  Final    Coagulation Studies:  Recent Labs  11/10/15 1901  LABPROT 13.7  INR 1.03    Imaging: Dg Chest 2 View  11/10/2015  CLINICAL DATA:  Altered mental status, history of MS EXAM: CHEST  2 VIEW COMPARISON:  10/04/2015 FINDINGS: Lungs are clear.  No pleural effusion or pneumothorax. The heart is top-normal in size. Mild degenerative changes of the visualized thoracolumbar spine. Cholecystectomy clips. IMPRESSION: No evidence of acute cardiopulmonary disease. Electronically Signed   By: Julian Hy M.D.   On: 11/10/2015 18:03   Ct Head Wo Contrast  11/10/2015  CLINICAL DATA:  Confusion EXAM: CT HEAD WITHOUT CONTRAST TECHNIQUE: Contiguous axial images were obtained from the base of the skull through the vertex without intravenous contrast. COMPARISON:  None. FINDINGS: There is no evidence of mass effect,  midline shift, or extra-axial fluid collections. There is no evidence of a space-occupying lesion or intracranial hemorrhage. There is no evidence of a cortical-based area of acute infarction. There is generalized cerebral atrophy. There is periventricular white matter low attenuation likely secondary to microangiopathy. The ventricles and sulci are appropriate for the patient's age. The basal cisterns are patent. Visualized portions of the orbits are unremarkable. The visualized portions of the paranasal sinuses and mastoid air cells are unremarkable. Cerebrovascular atherosclerotic calcifications are noted. The osseous structures are unremarkable. IMPRESSION: 1. No acute intracranial pathology. 2. Chronic microvascular disease and cerebral atrophy. Electronically Signed   By: Kathreen Devoid   On: 11/10/2015 17:51   Mr Brain Wo Contrast  11/11/2015  CLINICAL DATA:  Altered mental status, discharged from rehabilitation less than 10 days ago. Acute encephalopathy, similar prior episodes. History of multiple sclerosis. EXAM: MRI HEAD WITHOUT CONTRAST TECHNIQUE: Multiplanar, multiecho pulse sequences of the brain and surrounding structures were obtained without intravenous contrast. COMPARISON:  CT head September 08, 2016 and MRI head October 04, 2015 FINDINGS: Moderate ventriculomegaly on the basis of global parenchymal brain volume loss, advanced for age of stable from prior imaging. Patchy pontine and confluent supratentorial white matter T2 hyperintensities in addition to cystic periventricular lesions with low T1 signal consistent with black holes of demyelination. Stable lesion RIGHT basal ganglia. Subcentimeter LEFT subependymal lesion with crescentic susceptibility artifact is unchanged, nonspecific. Few additional punctate foci of susceptibility artifact, nonspecific. No abnormal extra-axial fluid collections. Normal major intracranial vascular flow voids present at the skull base. Ocular globes and orbital  contents appear normal though, not tailored for assessment of optic neuritis. Paranasal sinuses and mastoid air cells are well aerated. No abnormal sellar expansion. No cerebellar tonsillar ectopia. No abnormal calvarial bone marrow signal. No abnormal mass lesions, mass effect. No reduced  diffusion to suggest acute ischemia nor hyperacute demyelination. IMPRESSION: No acute intracranial process. Stable appearance of the brain: Advanced white matter changes compatible with chronic demyelination and moderate global parenchymal brain volume loss, advanced for age. Stable subcentimeter hemorrhagic periatrial subependymal nodule. Electronically Signed   By: Elon Alas M.D.   On: 11/11/2015 01:46       Assessment and plan per attending neurologist  Wanda Quill PA-C [facility] [phone]  11/11/2015, 10:48 AM   Assessment/Plan:  65y/o woman with hx of MS, on Tysbabri (JC virus +) presenting with transient episode of confusion, currently back to her baseline. Unclear etiology. Episode concerning for possible seizure with post-ictal confusion. Patient does note taking baclofen at night, she is unclear what dose she takes as she will take random tablets. Question if episodes related to baclofen overdose or withdrawal. With known JC + status in patient taking Tysabri we need to consider possible PML but this is unlikely based on the complete return to baseline at this time.  -increase lamictal to 125mg  BID for question of seizure activity -hold all baclofen -follow up with outpatient neurology  Wanda Like, DO [facility] [phone]  If 7pm- 7am, please page neurology on call as listed in [place].

## 2015-11-12 ENCOUNTER — Telehealth: Payer: Self-pay

## 2015-11-12 ENCOUNTER — Ambulatory Visit: Payer: Federal, State, Local not specified - PPO | Admitting: Physician Assistant

## 2015-11-12 DIAGNOSIS — E876 Hypokalemia: Secondary | ICD-10-CM | POA: Diagnosis not present

## 2015-11-12 DIAGNOSIS — G9340 Encephalopathy, unspecified: Secondary | ICD-10-CM

## 2015-11-12 DIAGNOSIS — I10 Essential (primary) hypertension: Secondary | ICD-10-CM | POA: Diagnosis not present

## 2015-11-12 DIAGNOSIS — F05 Delirium due to known physiological condition: Secondary | ICD-10-CM | POA: Diagnosis not present

## 2015-11-12 LAB — CBC
HCT: 31.1 % — ABNORMAL LOW (ref 36.0–46.0)
Hemoglobin: 10.5 g/dL — ABNORMAL LOW (ref 12.0–15.0)
MCH: 28.1 pg (ref 26.0–34.0)
MCHC: 33.8 g/dL (ref 30.0–36.0)
MCV: 83.2 fL (ref 78.0–100.0)
PLATELETS: 179 10*3/uL (ref 150–400)
RBC: 3.74 MIL/uL — AB (ref 3.87–5.11)
RDW: 15.7 % — ABNORMAL HIGH (ref 11.5–15.5)
WBC: 5.6 10*3/uL (ref 4.0–10.5)

## 2015-11-12 LAB — BASIC METABOLIC PANEL
ANION GAP: 10 (ref 5–15)
BUN: 9 mg/dL (ref 6–20)
CO2: 23 mmol/L (ref 22–32)
Calcium: 8.8 mg/dL — ABNORMAL LOW (ref 8.9–10.3)
Chloride: 109 mmol/L (ref 101–111)
Creatinine, Ser: 1.02 mg/dL — ABNORMAL HIGH (ref 0.44–1.00)
GFR calc Af Amer: >60 mL/min (ref >60)
GFR, EST NON AFRICAN AMERICAN: 56 mL/min — AB (ref >60)
Glucose, Bld: 106 mg/dL — ABNORMAL HIGH (ref 65–99)
POTASSIUM: 3.2 mmol/L — AB (ref 3.5–5.1)
SODIUM: 142 mmol/L (ref 135–145)

## 2015-11-12 LAB — FOLATE RBC
FOLATE, HEMOLYSATE: 404.4 ng/mL
Folate, RBC: 1330 ng/mL (ref >498)
Hematocrit: 30.4 % — ABNORMAL LOW (ref 34.0–46.6)

## 2015-11-12 LAB — HEMOGLOBIN A1C
Hgb A1c MFr Bld: 5.3 % (ref 4.8–5.6)
Mean Plasma Glucose: 105 mg/dL

## 2015-11-12 LAB — LAMOTRIGINE LEVEL: Lamotrigine Lvl: 11.8 ug/mL (ref 2.0–20.0)

## 2015-11-12 MED ORDER — BACLOFEN 10 MG PO TABS: 10.0000 mg | ORAL_TABLET | Freq: Every day | ORAL | Status: DC

## 2015-11-12 MED ORDER — POTASSIUM CHLORIDE CRYS ER 20 MEQ PO TBCR
40.0000 meq | EXTENDED_RELEASE_TABLET | Freq: Two times a day (BID) | ORAL | Status: AC
  Administered 2015-11-12 (×2): 40 meq via ORAL
  Filled 2015-11-12 (×3): qty 2

## 2015-11-12 MED ORDER — BACLOFEN 20 MG PO TABS
40.0000 mg | ORAL_TABLET | Freq: Two times a day (BID) | ORAL | Status: DC
  Administered 2015-11-12 – 2015-11-14 (×5): 40 mg via ORAL
  Filled 2015-11-12 (×5): qty 2

## 2015-11-12 NOTE — Telephone Encounter (Signed)
Pt is in hospital and they will contact us back when she gets out  Dr. Tamala Julian just Juluis Rainier

## 2015-11-12 NOTE — Telephone Encounter (Signed)
Was confirming that we had got message about pt.  She declined social work.  Message was received 10/29/15

## 2015-11-12 NOTE — Telephone Encounter (Signed)
Wanda [name] from home health service  [phone] is needing to talk with someone about declined social work and she wants to make sure that the messages have gotten to the physician  [name] [phone]

## 2015-11-12 NOTE — NC FL2 (Signed)
[place] MEDICAID FL2 LEVEL OF CARE SCREENING TOOL     IDENTIFICATION  Patient Name: Wanda [name] Birthdate: Oct 05, 1951 Sex: female Admission Date (Current Location): 11/10/2015  [facility] and [place] Number:  Herbalist and Address:  [facility] [place], [place] [address] [place], [place] [address]      Provider Number: [id number]  Attending Physician Name and Address:  Charlynne Cousins, MD  Relative Name and Phone Number:       Current Level of Care: Hospital Recommended Level of Care: [place] Prior Approval Number:    Date Approved/Denied:   PASRR Number: [id number]  Discharge Plan: SNF    Current Diagnoses: Patient Active Problem List   Diagnosis Date Noted  . Acute encephalopathy 11/10/2015  . Eye pain   . Right sided weakness   . Altered level of consciousness 10/04/2015  . UTI (lower urinary tract infection) 09/16/2015  . Acute confusional state 08/08/2015  . Normocytic anemia 07/30/2015  . Altered mental status 07/29/2015  . Hypokalemia   . Gait difficulty 08/10/2014  . Multiple sclerosis ([place]) 10/25/2013  . Insomnia 03/23/2013  . Fibroids 12/29/2012  . Elevated cholesterol   . Hypertension   . Arthritis   . Cervical dysplasia     Orientation RESPIRATION BLADDER Height & Weight     Self, Time, Situation, Place  Normal Continent Weight: 113 lb (51.256 kg) Height:  4\' 11"  (149.9 cm)  BEHAVIORAL SYMPTOMS/MOOD NEUROLOGICAL BOWEL NUTRITION STATUS      Continent Diet (see DC summary)  AMBULATORY STATUS COMMUNICATION OF NEEDS Skin   Limited Assist Verbally Normal                       Personal Care Assistance Level of Assistance  Bathing, Dressing Bathing Assistance: Limited assistance   Dressing Assistance: Limited assistance     Functional Limitations Info  Sight Sight Info: Impaired        SPECIAL CARE FACTORS FREQUENCY  PT (By licensed PT)     PT Frequency: 5/wk               Contractures      Additional Factors Info  Code Status, Allergies Code Status Info: FULL Allergies Info: Baclofen           Current Medications (11/12/2015):  This is the current hospital active medication list Current Facility-Administered Medications  Medication Dose Route Frequency Provider Last Rate Last Dose  . acetaminophen (TYLENOL) tablet 650 mg  650 mg Oral Q4H PRN Vianne Bulls, MD       Or  . acetaminophen (TYLENOL) suppository 650 mg  650 mg Rectal Q4H PRN Vianne Bulls, MD      . baclofen (LIORESAL) tablet 40 mg  40 mg Oral BID Charlynne Cousins, MD   40 mg at 11/12/15 [id number]  . cholecalciferol (VITAMIN D) tablet 2,000 Units  2,000 Units Oral Daily Vianne Bulls, MD   2,000 Units at 11/12/15 0827  . clonazePAM (KLONOPIN) tablet 0.5 mg  0.5 mg Oral QHS PRN Vianne Bulls, MD   0.5 mg at 11/11/15 2230  . enoxaparin (LOVENOX) injection 40 mg  40 mg Subcutaneous Daily Vianne Bulls, MD   40 mg at 11/11/15 1411  . gatifloxacin (ZYMAXID) 0.5 % ophthalmic drops 1 drop  1 drop Left Eye QID Geradine Girt, DO   1 drop at 11/12/15 0800  . lamoTRIgine (LAMICTAL) tablet 125 mg  125 mg  Oral BID Marliss Coots, PA-C   125 mg at 11/12/15 [id number]  . potassium chloride SA (K-DUR,KLOR-CON) CR tablet 40 mEq  40 mEq Oral BID Charlynne Cousins, MD   40 mEq at 11/12/15 0924  . prednisoLONE acetate (PRED FORTE) 1 % ophthalmic suspension 1 drop  1 drop Left Eye QID Geradine Girt, DO   1 drop at 11/12/15 0800  . senna-docusate (Senokot-S) tablet 1 tablet  1 tablet Oral QHS PRN Vianne Bulls, MD      . traZODone (DESYREL) tablet 50 mg  50 mg Oral QHS Vianne Bulls, MD   50 mg at 11/11/15 2230     Discharge Medications: Please see discharge summary for a list of discharge medications.  Relevant Imaging Results:  Relevant Lab Results:   Additional Information SSN: [id number]  Cranford Mon, [place]

## 2015-11-12 NOTE — Progress Notes (Signed)
Patient is alert and oriented and good historian. Patient with history of MS who lives on her own in an apartment with her 2 dogs. Patient in observation for medication adjustments 2/2 MS.Spoke with patient about disposition. Patient is refusing to go to SNF.Patient has DME walker cane and WC for transport if needed. Patient declines further DME. Patient currently is being followed by MS clinic/ study. Patient states that her sister is coming from [place] and she able to assist her with what she needs once home. Sister is planning on arriving tonight and staying "a while." CM asked spefically how long, and patient stated, "at least a week." CM informed patient that her daughter is not in agreement with discharge plan to home with home health, and that she was yelling at MD over the phone earlier today. Patient stated that she is the POA, but "not my boss. I am going home." Patient chose [facility] for home health, referral made.

## 2015-11-12 NOTE — Progress Notes (Addendum)
10:30 am CSW spoke with dtr and patient [employer] RNCM on phone.  They are very adamant about pt not being safe to return home and [employer] RNCM was insisting that we work within patients insurance and change patient to inpatient status- CSW explaiend that inpatient status is determined based on medical needs/interventions and that even if MD were to change status it gets reviewed and would be changed back to observation  RNCM and dtr continued to argue that inpatient status was best choice because patient is reportedly very confused and altered from baseline.  Dtr stated that she wanted a family meeting with MD- CSW paged MD to ask for him to contact dtr.  MD called and spoke with dtr and Advice worker- MD states that dtr and Advice worker were belligerent over the phone and being very forceful with MD about changing patient status to inpatient.   RNCM went back in to speak with patient who is now adamant about returning home with help from her sister.  No concerns about capacity from medical team- plan is for patient to return home with home health services  CSW signing off for now- if disposition changes please reconsult CSW.  9:45 am CSW spoke with pt who states plan is to go home with home health services- pt gave permission for CSW to speak with dtr to discuss plan.  CSW discussed with dtr- dtr states that pt is NOT safe to return home and feels very strongly that pt needs to go to a facility.  Pt dtr is not willing to take patient home at this time.  CSW discussed MD note which states pt sister will be coming in to assist at home.  Dtr states this is not for sure and that if sister came to help it would only be for a few days.  CSW explained that Medicare would not covers stay since pt does not have 3 night qualifying stay but that pt could go to a facility under Medicaid with Medicare still being billed for other medical costs- Dtr would like to pursue this option.  CSW spoke with patient who is agreeable  to whatever her daughter thinks is best.  CSW will continue to follow  Domenica Reamer, [place] Social Worker [phone]

## 2015-11-12 NOTE — Telephone Encounter (Signed)
Yes, I received it several times. Thank you, and I am sorry-I was not aware of anything I was to send back in acknowedgment.  Patient has been re-admitted to the hospital.

## 2015-11-12 NOTE — Progress Notes (Addendum)
[facility] PROGRESS NOTE    Progress Note   Wanda [name] [id number] DOB: [birth year] DOA: 11/10/2015 PCP: JEFFERY,[name], PA-C   Brief Narrative:   Wanda [name] is an 65 y.o. female with MS that comes in for encephalopathy.  Assessment/Plan:   Acute confusional state/  Acute encephalopathy: Uncertain etiology questionably due to baclofen she is taking 90 mg in a day we'll reduce her dose. UA reassuring, chest x-ray clear, MRI no CVA or RR negative TSH is borderline low recommend repeating in 6 weeks. B-12 greater than thousand. Neurology was consulted and recommended to increase. Increase Lamictal 225 mg twice a day by neurology. Physical therapy was consulted to recommend this skilled nursing facility versus 24 hour supervision at home. Spoke with the patient her sister is coming to stay with her and she'll be here tomorrow or arrange discharge for tomorrow.  Daughter is interfering with mother discharge, as the daughter wants her to go to SNF and pt wants to home. I spoke to the daughter over the phone and she started and she was being overbearing about her mom's situation trying to force me to change her from observation to inpatient status, when I try to explain to her that, that determination is made by her insurance, she started screaming and insulting me and being belligerent. She never allowed for me to explain her mom's condition test, diagnosis and recommendations. Patient is awake alert and oriented and able to make all of her decisions.  Essential Hypertension Normotensive continue monitor off antihypertensive medication.  Multiple sclerosis ([place]): Relapsing remitting follow-up with Dr. Krista Blue as an outpatient. She is undergoing Tysarbi  infusion every monthly. He was taking baclofen at home it was never discontinued. She was taking 10 mg in the morning and 80 mg in the evening. The maximum dose is 80 mg daily total. This is probably contributing to her  seizures as she was overdosing on baclofen.  Hypokalemia: Replete orally recheck in the morning.     DVT Prophylaxis - Lovenox ordered.  Family Communication: none Disposition Plan: Home in am Code Status:     Code Status Orders        Start     Ordered   11/10/15 2126  Full code   Continuous     11/10/15 2129    Code Status History    Date Active Date Inactive Code Status Order ID Comments User Context   10/04/2015  4:25 PM 10/08/2015  4:31 PM Full Code [id number]  Oswald Hillock, MD Inpatient   09/16/2015  4:57 PM 09/18/2015  8:46 PM Full Code [id number]  Elmarie Shiley, MD Inpatient   07/29/2015  2:14 PM 07/30/2015  9:48 PM Full Code [id number]  Veatrice Bourbon, MD Inpatient   03/12/2015  1:40 PM 03/13/2015 10:37 PM Full Code [id number]  Juluis Mire, MD ED        IV Access:    Peripheral IV   Procedures and diagnostic studies:   Dg Chest 2 View  11/10/2015  CLINICAL DATA:  Altered mental status, history of MS EXAM: CHEST  2 VIEW COMPARISON:  10/04/2015 FINDINGS: Lungs are clear.  No pleural effusion or pneumothorax. The heart is top-normal in size. Mild degenerative changes of the visualized thoracolumbar spine. Cholecystectomy clips. IMPRESSION: No evidence of acute cardiopulmonary disease. Electronically Signed   By: Julian Hy M.D.   On: 11/10/2015 18:03   Ct Head Wo Contrast  11/10/2015  CLINICAL DATA:  Confusion EXAM: CT HEAD WITHOUT CONTRAST TECHNIQUE:  Contiguous axial images were obtained from the base of the skull through the vertex without intravenous contrast. COMPARISON:  None. FINDINGS: There is no evidence of mass effect, midline shift, or extra-axial fluid collections. There is no evidence of a space-occupying lesion or intracranial hemorrhage. There is no evidence of a cortical-based area of acute infarction. There is generalized cerebral atrophy. There is periventricular white matter low attenuation likely secondary to microangiopathy. The ventricles and  sulci are appropriate for the patient's age. The basal cisterns are patent. Visualized portions of the orbits are unremarkable. The visualized portions of the paranasal sinuses and mastoid air cells are unremarkable. Cerebrovascular atherosclerotic calcifications are noted. The osseous structures are unremarkable. IMPRESSION: 1. No acute intracranial pathology. 2. Chronic microvascular disease and cerebral atrophy. Electronically Signed   By: Kathreen Devoid   On: 11/10/2015 17:51   Mr Brain Wo Contrast  11/11/2015  CLINICAL DATA:  Altered mental status, discharged from rehabilitation less than 10 days ago. Acute encephalopathy, similar prior episodes. History of multiple sclerosis. EXAM: MRI HEAD WITHOUT CONTRAST TECHNIQUE: Multiplanar, multiecho pulse sequences of the brain and surrounding structures were obtained without intravenous contrast. COMPARISON:  CT head September 08, 2016 and MRI head October 04, 2015 FINDINGS: Moderate ventriculomegaly on the basis of global parenchymal brain volume loss, advanced for age of stable from prior imaging. Patchy pontine and confluent supratentorial white matter T2 hyperintensities in addition to cystic periventricular lesions with low T1 signal consistent with black holes of demyelination. Stable lesion RIGHT basal ganglia. Subcentimeter LEFT subependymal lesion with crescentic susceptibility artifact is unchanged, nonspecific. Few additional punctate foci of susceptibility artifact, nonspecific. No abnormal extra-axial fluid collections. Normal major intracranial vascular flow voids present at the skull base. Ocular globes and orbital contents appear normal though, not tailored for assessment of optic neuritis. Paranasal sinuses and mastoid air cells are well aerated. No abnormal sellar expansion. No cerebellar tonsillar ectopia. No abnormal calvarial bone marrow signal. No abnormal mass lesions, mass effect. No reduced diffusion to suggest acute ischemia nor hyperacute  demyelination. IMPRESSION: No acute intracranial process. Stable appearance of the brain: Advanced white matter changes compatible with chronic demyelination and moderate global parenchymal brain volume loss, advanced for age. Stable subcentimeter hemorrhagic periatrial subependymal nodule. Electronically Signed   By: Elon Alas M.D.   On: 11/11/2015 01:46     Medical Consultants:    None.  Anti-Infectives:   Anti-infectives    None      Subjective:    Wanda [name] complaining of diffuse pain, all over her joints.  Objective:    Filed Vitals:   11/11/15 0852 11/11/15 1248 11/11/15 2059 11/12/15 0532  BP: 146/83 170/87 148/87 134/81  Pulse: 75 86 85 73  Temp:  97.7 F (36.5 C) 98.3 F (36.8 C) 98.4 F (36.9 C)  TempSrc:  Oral Oral Oral  Resp:  18 18 18   Height:      Weight:      SpO2: 100% 99% 98% 98%    Intake/Output Summary (Last 24 hours) at 11/12/15 0832 Last data filed at 11/12/15 0316  Gross per 24 hour  Intake    120 ml  Output    850 ml  Net   -730 ml   Filed Weights   11/10/15 1455  Weight: 51.256 kg (113 lb)    Exam: Gen:  NAD, patient is awake alert and oriented 3 Cardiovascular:  RRR. Chest and lungs:   Good air movement and clear to auscultation. Abdomen:  Abdomen soft,  NT/ND, + BS Extremities:  No edema   Data Reviewed:    Labs: Basic Metabolic Panel:  Recent Labs Lab 11/10/15 1901 11/11/15 1033 11/11/15 2340  NA 144  --  142  K 3.3*  --  3.2*  CL 107  --  109  CO2 24  --  23  GLUCOSE 68  --  106*  BUN 10  --  9  CREATININE 1.00  --  1.02*  CALCIUM 9.3  --  8.8*  MG  --  2.1  --    GFR Estimated Creatinine Clearance: 37.5 mL/min (by C-G formula based on Cr of 1.02). Liver Function Tests:  Recent Labs Lab 11/10/15 1901  AST 14*  ALT 11*  ALKPHOS 69  BILITOT 0.8  PROT 6.6  ALBUMIN 3.5    Recent Labs Lab 11/10/15 1901  LIPASE 17    Recent Labs Lab 11/10/15 1710  AMMONIA 42*   Coagulation  profile  Recent Labs Lab 11/10/15 1901  INR 1.03    CBC:  Recent Labs Lab 11/10/15 1710 11/11/15 2340  WBC 6.6 5.6  HGB 12.8 10.5*  HCT 38.4 31.1*  MCV 83.8 83.2  PLT 182 179   Cardiac Enzymes:  Recent Labs Lab 11/11/15 0020  TROPONINI <0.03   BNP (last 3 results) No results for input(s): PROBNP in the last 8760 hours. CBG:  Recent Labs Lab 11/10/15 1511  GLUCAP 72   D-Dimer: No results for input(s): DDIMER in the last 72 hours. Hgb A1c:  Recent Labs  11/11/15 0601  HGBA1C 5.3   Lipid Profile:  Recent Labs  11/11/15 0601  CHOL 186  HDL 58  LDLCALC 115*  TRIG 65  CHOLHDL 3.2   Thyroid function studies:  Recent Labs  11/11/15 0020  TSH 0.230*   Anemia work up:  Recent Labs  11/11/15 0020  VITAMINB12 2060*   Sepsis Labs:  Recent Labs Lab 11/10/15 1710 11/10/15 1732 11/11/15 2340  WBC 6.6  --  5.6  LATICACIDVEN  --  1.86  --    Microbiology Recent Results (from the past 240 hour(s))  Urine culture     Status: None   Collection Time: 11/10/15  4:40 PM  Result Value Ref Range Status   Specimen Description URINE, CATHETERIZED  Final   Special Requests NONE  Final   Culture NO GROWTH 1 DAY  Final   Report Status 11/11/2015 FINAL  Final     Medications:   . cholecalciferol  2,000 Units Oral Daily  . enoxaparin (LOVENOX) injection  40 mg Subcutaneous Daily  . gatifloxacin  1 drop Left Eye QID  . lamoTRIgine  125 mg Oral BID  . prednisoLONE acetate  1 drop Left Eye QID  . traZODone  50 mg Oral QHS   Continuous Infusions:   Time spent: 15 min     Charlynne Cousins  [facility] Pager [phone]  *Please refer to [email or web address], password [email or web address] to get updated schedule on who will round on this patient, as hospitalists switch teams weekly. If 7PM-7AM, please contact night-coverage at [email or web address], password [email or web address] for any overnight needs.  11/12/2015, 8:32 AM

## 2015-11-12 NOTE — Telephone Encounter (Signed)
Noted  

## 2015-11-13 DIAGNOSIS — G9340 Encephalopathy, unspecified: Secondary | ICD-10-CM | POA: Diagnosis not present

## 2015-11-13 DIAGNOSIS — F05 Delirium due to known physiological condition: Secondary | ICD-10-CM | POA: Diagnosis not present

## 2015-11-13 DIAGNOSIS — E876 Hypokalemia: Secondary | ICD-10-CM | POA: Diagnosis not present

## 2015-11-13 DIAGNOSIS — G35 Multiple sclerosis: Secondary | ICD-10-CM | POA: Diagnosis not present

## 2015-11-13 MED ORDER — LOPERAMIDE HCL 2 MG PO CAPS
2.0000 mg | ORAL_CAPSULE | Freq: Once | ORAL | Status: AC
  Administered 2015-11-13: 2 mg via ORAL
  Filled 2015-11-13: qty 1

## 2015-11-13 MED ORDER — HYDROCODONE-ACETAMINOPHEN 5-325 MG PO TABS
1.0000 | ORAL_TABLET | ORAL | Status: DC | PRN
  Administered 2015-11-13 (×2): 1 via ORAL
  Filled 2015-11-13 (×2): qty 1

## 2015-11-13 MED ORDER — POTASSIUM CHLORIDE CRYS ER 20 MEQ PO TBCR
40.0000 meq | EXTENDED_RELEASE_TABLET | Freq: Two times a day (BID) | ORAL | Status: AC
Start: 2015-11-13 — End: 2015-11-13
  Administered 2015-11-13 (×2): 40 meq via ORAL
  Filled 2015-11-13 (×2): qty 2

## 2015-11-13 MED ORDER — LIDOCAINE 5 % EX PTCH
1.0000 | MEDICATED_PATCH | CUTANEOUS | Status: DC
  Administered 2015-11-13 – 2015-11-14 (×2): 1 via TRANSDERMAL
  Filled 2015-11-13 (×2): qty 1

## 2015-11-13 NOTE — Progress Notes (Signed)
Physical Therapy Treatment Patient Details Name: Wanda [name] MRN: [id number] DOB: [birth year] Today's Date: 11/13/2015    History of Present Illness Wanda [name] is an 65 y.o. female with PMH of hypertension, hyperlipidemia, and relapsing remitting type multiple sclerosis who presents to the ED after EMS was activated by her daughter for recurrent falls and confusional state. Patient had been admitted here in December 2016 under similar circumstances and was eventually discharged to SNF where she rehabilitated for 4 weeks prior to returning home approximately 10 days ago. Initially, upon her return home from rehabilitation, she was ambulating on her own and performing most of her ADLs independently. Over the ensuing week and a half however, her daughter reports multiple falls, stating that this is typical for the patient. The day prior to her presentation, the patient called her daughter, reporting that she had fallen and needed help getting up. When her daughter arrived, the smoke alarm was going off and chicken was burning in the oven, with the patient lying on the floor unable to get up on her own. This morning, when the patient's daughter went to check on her, the patient was essentially nonverbal and unable to get out of bed on her own. There has been no evidence of head injury or significant trauma per report of the patient's daughter and there is been no recent fevers, chills, cough, nausea, vomiting, or diarrhea. The last time that the patient had a similar presentation, she is noted to have a UTI and this was thought to account for her symptoms. Her daughter assumed that this was the case again and activated EMS for transport to the hospital.     PT Comments    Pt performed increased activity with poor safety awareness.  Pt required max VCs for RW safety, 120 ft, min assist.  Pt inconsistently follows commands to correct technique to improve safety.  Follow Up Recommendations  SNF;Supervision/Assistance - 24 hour     Equipment Recommendations  None recommended by PT    Recommendations for Other Services       Precautions / Restrictions Precautions Precautions: Fall Restrictions Weight Bearing Restrictions: No    Mobility  Bed Mobility Overal bed mobility: Needs Assistance Bed Mobility: Rolling;Sidelying to Sit Rolling: Min assist   Supine to sit: Min assist     General bed mobility comments: Pt required assist with upper trunk to ascend to edge of bed for sit to supine pt required assist to lift B LEs back into bed.    Transfers Overall transfer level: Needs assistance Equipment used: Rolling walker (2 wheeled) Transfers: Sit to/from Stand Sit to Stand: Min guard         General transfer comment: Cues for safety to push from seated surface before ascending.  Pt also required cues for backing completely to seated surface before attempting to sit.    Ambulation/Gait Ambulation/Gait assistance: Min assist Ambulation Distance (Feet): 120 Feet Assistive device: Rolling walker (2 wheeled) Gait Pattern/deviations: Step-to pattern;Decreased step length - right;Decreased step length - left;Staggering right;Staggering left;Drifts right/left;Wide base of support     General Gait Details: Pt required cues to maintain close proximity to RW to improve safety and promote functional independence.  Pt remains to flex forward at hip which limits safety with use of RW.     Stairs            Wheelchair Mobility    Modified Rankin (Stroke Patients Only)       Balance  Sitting balance-Leahy Scale: Fair       Standing balance-Leahy Scale: Poor                      Cognition Arousal/Alertness: Awake/alert Behavior During Therapy: Flat affect Overall Cognitive Status: History of cognitive impairments - at baseline Area of Impairment: Safety/judgement;Problem solving;Following commands     Memory: Decreased short-term  memory;Decreased recall of precautions Following Commands: Follows one step commands with increased time Safety/Judgement: Decreased awareness of safety;Decreased awareness of deficits   Problem Solving: Slow processing;Requires verbal cues;Requires tactile cues General Comments: Pt intermittently following one step commands.      Exercises      General Comments        Pertinent Vitals/Pain Faces Pain Scale: Hurts even more Pain Location: shoulders  Pain Descriptors / Indicators: Grimacing;Guarding;Sore Pain Intervention(s): Limited activity within patient's tolerance;Monitored during session;Repositioned    Home Living                      Prior Function            PT Goals (current goals can now be found in the care plan section) Acute Rehab PT Goals Potential to Achieve Goals: Fair Progress towards PT goals: Progressing toward goals    Frequency  Min 3X/week    PT Plan      Co-evaluation             End of Session Equipment Utilized During Treatment: Gait belt Activity Tolerance: Patient limited by fatigue Patient left: in chair;with call bell/phone within reach;with chair alarm set     Time: 1401-1416 PT Time Calculation (min) (ACUTE ONLY): 15 min  Charges:  $Gait Training: 8-22 mins                    G Codes:      Cristela Blue 2015/12/02, 3:58 PM  Governor Rooks, PTA pager [phone]

## 2015-11-13 NOTE — Progress Notes (Signed)
Received request to call [facility] with discharge plan, patient did not grant permission to call [place]. Reviewed DC plan with patient and she verbalized understanding.

## 2015-11-13 NOTE — Progress Notes (Signed)
Occupational Therapy Treatment Patient Details Name: Wanda [name] MRN: [id number] DOB: 05/30/1951 Today's Date: 11/13/2015    History of present illness Wanda [name] is an 65 y.o. female with PMH of hypertension, hyperlipidemia, and relapsing remitting type multiple sclerosis who presents to the ED after EMS was activated by her daughter for recurrent falls and confusional state. Patient had been admitted here in December 2016 under similar circumstances and was eventually discharged to SNF where she rehabilitated for 4 weeks prior to returning home approximately 10 days ago. Initially, upon her return home from rehabilitation, she was ambulating on her own and performing most of her ADLs independently. Over the ensuing week and a half however, her daughter reports multiple falls, stating that this is typical for the patient. The day prior to her presentation, the patient called her daughter, reporting that she had fallen and needed help getting up. When her daughter arrived, the smoke alarm was going off and chicken was burning in the oven, with the patient lying on the floor unable to get up on her own. This morning, when the patient's daughter went to check on her, the patient was essentially nonverbal and unable to get out of bed on her own. There has been no evidence of head injury or significant trauma per report of the patient's daughter and there is been no recent fevers, chills, cough, nausea, vomiting, or diarrhea. The last time that the patient had a similar presentation, she is noted to have a UTI and this was thought to account for her symptoms. Her daughter assumed that this was the case again and activated EMS for transport to the hospital.    OT comments  Pt continues to demonstrate impaired cognition and safety awareness. Requiring min guard to min assist for toileting and grooming this visit. Mod assist for bed mobility with back pain. Difficulty with sequencing.  Unable to place phone  call independently. Pt called daughter and left her a message on her voicemail about her desire to go to SNF for ST rehab. Pt is not safe to return home alone.  Follow Up Recommendations  SNF;Supervision/Assistance - 24 hour    Equipment Recommendations  None recommended by OT    Recommendations for Other Services      Precautions / Restrictions Precautions Precautions: Fall Restrictions Weight Bearing Restrictions: No       Mobility Bed Mobility Overal bed mobility: Needs Assistance Bed Mobility: Rolling;Sidelying to Sit Rolling: Min assist Sidelying to sit: Mod assist       General bed mobility comments: mod assist to raise trunk, used log roll technique as pt complaining of back pain  Transfers   Equipment used: Rolling walker (2 wheeled) Transfers: Sit to/from Stand Sit to Stand: Min guard         General transfer comment: from bed and from 3 in 1    Balance Overall balance assessment: Needs assistance Sitting-balance support: Feet supported Sitting balance-Leahy Scale: Fair       Standing balance-Leahy Scale: Poor                     ADL Overall ADL's : Needs assistance/impaired     Grooming: Wash/dry hands;Wash/dry face;Oral care;Sitting;Minimal assistance Grooming Details (indicate cue type and reason): assist for set up and sequence with oral care                 Toilet Transfer: Min guard;Ambulation;BSC;RW   Toileting- Clothing Manipulation and Hygiene: Sit to/from stand;Minimal assistance Toileting -  Clothing Manipulation Details (indicate cue type and reason): pt performed pericare, min assist to pull up Depends     Functional mobility during ADLs: Minimal assistance;Rolling walker (assist to navigate around obstacles) General ADL Comments: Pt attempting to release walker and ambulate holding furniture.      Vision                     Perception     Praxis      Cognition   Behavior During Therapy: Flat  affect Overall Cognitive Status: History of cognitive impairments - at baseline Area of Impairment: Safety/judgement;Problem solving;Following commands     Memory: Decreased short-term memory;Decreased recall of precautions  Following Commands: Follows one step commands with increased time Safety/Judgement: Decreased awareness of safety;Decreased awareness of deficits   Problem Solving: Slow processing;Requires verbal cues;Requires tactile cues General Comments: pt needing assist to place a call to her daughter, attempts to use call button    Extremity/Trunk Assessment               Exercises     Shoulder Instructions       General Comments      Pertinent Vitals/ Pain       Pain Assessment: Faces Faces Pain Scale: Hurts even more Pain Location: back Pain Descriptors / Indicators: Guarding;Grimacing Pain Intervention(s): Limited activity within patient's tolerance;Patient requesting pain meds-RN notified;Heat applied  Home Living                                          Prior Functioning/Environment              Frequency Min 2X/week     Progress Toward Goals  OT Goals(current goals can now be found in the care plan section)  Progress towards OT goals: Not progressing toward goals - comment (back pain, impaired cognition)  Acute Rehab OT Goals Patient Stated Goal: agreeable to rehab for 10 days prior to return home  Plan Discharge plan remains appropriate    Co-evaluation                 End of Session Equipment Utilized During Treatment: Gait belt;Rolling walker   Activity Tolerance Patient limited by pain   Patient Left in chair;with call bell/phone within reach;with chair alarm set   Nurse Communication Other (comment) (pt now wants to go to rehab)        Time: 1130-1157 OT Time Calculation (min): 27 min  Charges: OT General Charges $OT Visit: 1 Procedure OT Treatments $Self Care/Home Management : 23-37  mins  Malka So 11/13/2015, 12:05 PM  [phone]

## 2015-11-13 NOTE — Progress Notes (Signed)
[facility] PROGRESS NOTE    Progress Note   DORATHY BODENHAMER [id number] DOB: 11/08/1950 DOA: 11/10/2015 PCP: JEFFERY,[name], PA-C   Brief Narrative:   Wanda [name] is an 65 y.o. female with MS that comes in for encephalopathy.  Assessment/Plan:   Acute confusional state/  Acute encephalopathy: Uncertain etiology, possibly high dose baclofen contributing, she was taking 90 mg in a day hence reduced her dose, cpomlex partial seizure also possibility. UA reassuring, chest x-ray clear, MRI no CVA or RR negative TSH is borderline low recommend repeating in 6 weeks. B-12 WNL Neurology was consulted and recommended to increase Lamictal 225 mg twice a day, due to question of Seizure activity -H/o  Positive JC virus, no evidence of PML on MRI, and unlikely to return to baseline so quickly if this was the case per Neurology -Physical therapy was consulted to recommend this skilled nursing facility versus 24 hour supervision at home. -Pt has been wanting to go home and declining Rehab, now with Low back pain, she is undecided  Low back pain -due to MS most likely -trial of lidocaine patch, low dose vicodin if doesn't improve  -PT  Essential Hypertension -Normotensive , stable off antihypertensive medication.  Multiple sclerosis ([place]): Relapsing remitting follow-up with Dr. Krista Blue as an outpatient. She is undergoing Tysarbi  infusion every monthly,  Hypokalemia: Replete     DVT Prophylaxis - Lovenox ordered.  FUll Code Family Communication: none  At bedside Disposition Plan: Home vs Rehab when symptoms better, likely tomorrow     IV Access:    Peripheral IV   Procedures and diagnostic studies:   No results found.   Medical Consultants:    None.  Anti-Infectives:   Anti-infectives    None      Subjective:    Rayne Du complaining of low back pain and R shoulder pain  Objective:    Filed Vitals:   11/12/15 1759 11/12/15 2241 11/13/15 0610  11/13/15 1425  BP: 158/87 147/84 134/78 116/72  Pulse: 77 67 71 74  Temp: 98.1 F (36.7 C) 98.2 F (36.8 C) 97.7 F (36.5 C) 99.1 F (37.3 C)  TempSrc: Oral Oral Oral Oral  Resp: 20 18 20 18   Height:      Weight:      SpO2: 100% 98% 99% 99%    Intake/Output Summary (Last 24 hours) at 11/13/15 1437 Last data filed at 11/13/15 1029  Gross per 24 hour  Intake    360 ml  Output    201 ml  Net    159 ml   Filed Weights   11/10/15 1455  Weight: 51.256 kg (113 lb)    Exam: Gen:  NAD, patient is awake alert and oriented 3 Cardiovascular:  RRR. Chest and lungs:   Good air movement and clear to auscultation. Abdomen:  Abdomen soft, NT/ND, + BS Extremities:  No edema NEuro: chronic R sided weakness   Data Reviewed:    Labs: Basic Metabolic Panel:  Recent Labs Lab 11/10/15 1901 11/11/15 1033 11/11/15 2340  NA 144  --  142  K 3.3*  --  3.2*  CL 107  --  109  CO2 24  --  23  GLUCOSE 68  --  106*  BUN 10  --  9  CREATININE 1.00  --  1.02*  CALCIUM 9.3  --  8.8*  MG  --  2.1  --    GFR Estimated Creatinine Clearance: 37.5 mL/min (by C-G formula based on Cr  of 1.02). Liver Function Tests:  Recent Labs Lab 11/10/15 1901  AST 14*  ALT 11*  ALKPHOS 69  BILITOT 0.8  PROT 6.6  ALBUMIN 3.5    Recent Labs Lab 11/10/15 1901  LIPASE 17    Recent Labs Lab 11/10/15 1710  AMMONIA 42*   Coagulation profile  Recent Labs Lab 11/10/15 1901  INR 1.03    CBC:  Recent Labs Lab 11/10/15 1710 11/11/15 0200 11/11/15 2340  WBC 6.6  --  5.6  HGB 12.8  --  10.5*  HCT 38.4 30.4* 31.1*  MCV 83.8  --  83.2  PLT 182  --  179   Cardiac Enzymes:  Recent Labs Lab 11/11/15 0020  TROPONINI <0.03   BNP (last 3 results) No results for input(s): PROBNP in the last 8760 hours. CBG:  Recent Labs Lab 11/10/15 1511  GLUCAP 72   D-Dimer: No results for input(s): DDIMER in the last 72 hours. Hgb A1c:  Recent Labs  11/11/15 0601  HGBA1C 5.3   Lipid  Profile:  Recent Labs  11/11/15 0601  CHOL 186  HDL 58  LDLCALC 115*  TRIG 65  CHOLHDL 3.2   Thyroid function studies:  Recent Labs  11/11/15 0020  TSH 0.230*   Anemia work up:  Recent Labs  11/11/15 0020  VITAMINB12 2060*   Sepsis Labs:  Recent Labs Lab 11/10/15 1710 11/10/15 1732 11/11/15 2340  WBC 6.6  --  5.6  LATICACIDVEN  --  1.86  --    Microbiology Recent Results (from the past 240 hour(s))  Urine culture     Status: None   Collection Time: 11/10/15  4:40 PM  Result Value Ref Range Status   Specimen Description URINE, CATHETERIZED  Final   Special Requests NONE  Final   Culture NO GROWTH 1 DAY  Final   Report Status 11/11/2015 FINAL  Final     Medications:   . baclofen  40 mg Oral BID  . cholecalciferol  2,000 Units Oral Daily  . enoxaparin (LOVENOX) injection  40 mg Subcutaneous Daily  . gatifloxacin  1 drop Left Eye QID  . lamoTRIgine  125 mg Oral BID  . lidocaine  1 patch Transdermal Q24H  . prednisoLONE acetate  1 drop Left Eye QID  . traZODone  50 mg Oral QHS   Continuous Infusions:   Time spent: 35 min     Amal Renbarger  [facility] Pager [phone]  *Please refer to [email or web address], password [email or web address] to get updated schedule on who will round on this patient, as hospitalists switch teams weekly. If 7PM-7AM, please contact night-coverage at [email or web address], password [email or web address] for any overnight needs.  11/13/2015, 2:37 PM

## 2015-11-14 DIAGNOSIS — F05 Delirium due to known physiological condition: Secondary | ICD-10-CM | POA: Diagnosis not present

## 2015-11-14 DIAGNOSIS — R4182 Altered mental status, unspecified: Secondary | ICD-10-CM | POA: Diagnosis not present

## 2015-11-14 DIAGNOSIS — E876 Hypokalemia: Secondary | ICD-10-CM | POA: Diagnosis not present

## 2015-11-14 DIAGNOSIS — G9340 Encephalopathy, unspecified: Secondary | ICD-10-CM | POA: Diagnosis not present

## 2015-11-14 DIAGNOSIS — G35 Multiple sclerosis: Secondary | ICD-10-CM | POA: Diagnosis not present

## 2015-11-14 LAB — CBC
HCT: 35.5 % — ABNORMAL LOW (ref 36.0–46.0)
Hemoglobin: 11.5 g/dL — ABNORMAL LOW (ref 12.0–15.0)
MCH: 27.2 pg (ref 26.0–34.0)
MCHC: 32.4 g/dL (ref 30.0–36.0)
MCV: 83.9 fL (ref 78.0–100.0)
PLATELETS: 197 10*3/uL (ref 150–400)
RBC: 4.23 MIL/uL (ref 3.87–5.11)
RDW: 15.7 % — AB (ref 11.5–15.5)
WBC: 6.4 10*3/uL (ref 4.0–10.5)

## 2015-11-14 LAB — BASIC METABOLIC PANEL
Anion gap: 11 (ref 5–15)
BUN: 9 mg/dL (ref 6–20)
CALCIUM: 9.4 mg/dL (ref 8.9–10.3)
CO2: 22 mmol/L (ref 22–32)
CREATININE: 0.85 mg/dL (ref 0.44–1.00)
Chloride: 109 mmol/L (ref 101–111)
GFR calc Af Amer: >60 mL/min (ref >60)
GFR calc non Af Amer: >60 mL/min (ref >60)
GLUCOSE: 87 mg/dL (ref 65–99)
Potassium: 4.6 mmol/L (ref 3.5–5.1)
Sodium: 142 mmol/L (ref 135–145)

## 2015-11-14 MED ORDER — SENNOSIDES-DOCUSATE SODIUM 8.6-50 MG PO TABS: 1.0000 | ORAL_TABLET | Freq: Every evening | ORAL | Status: DC | PRN

## 2015-11-14 MED ORDER — CLONAZEPAM 0.5 MG PO TABS: 0.5000 mg | ORAL_TABLET | Freq: Every evening | ORAL | Status: DC | PRN

## 2015-11-14 MED ORDER — LIDOCAINE 5 % EX PTCH: 1.0000 | MEDICATED_PATCH | CUTANEOUS | Status: DC

## 2015-11-14 MED ORDER — BACLOFEN 20 MG PO TABS: 40.0000 mg | ORAL_TABLET | Freq: Two times a day (BID) | ORAL | Status: DC

## 2015-11-14 MED ORDER — LAMOTRIGINE 25 MG PO TABS: 125.0000 mg | ORAL_TABLET | Freq: Two times a day (BID) | ORAL | Status: DC

## 2015-11-14 MED ORDER — HYDROCODONE-ACETAMINOPHEN 5-325 MG PO TABS: 1.0000 | ORAL_TABLET | ORAL | Status: DC | PRN

## 2015-11-14 NOTE — Care Management Note (Signed)
Case Management Note  Patient Details  Name: Wanda [name] MRN: [id number] Date of Birth: 08-26-1951  Subjective/Objective:                 Patient has decided to discharge to SNF.  Action/Plan:  Will DC to SNF as facilitated through [place].  Expected Discharge Date:                  Expected Discharge Plan:  Skilled Nursing Facility  In-House Referral:  Clinical Social Work  Discharge planning Services  CM Consult  Post Acute Care Choice:    Choice offered to:  Patient  DME Arranged:    DME Agency:     HH Arranged:    [place] Agency:     Status of Service:  Completed, signed off  Medicare Important Message Given:    Date Medicare IM Given:    Medicare IM give by:    Date Additional Medicare IM Given:    Additional Medicare Important Message give by:     If discussed at [place] of Stay Meetings, dates discussed:    Additional Comments:  Carles Collet, RN 11/14/2015, 11:53 AM

## 2015-11-14 NOTE — Progress Notes (Signed)
Patient will DC to: [facility] Anticipated DC date: 11/14/15 Family notified: Daughter Transport by: PTAR 2:30pm  CSW signing off.  Cedric Fishman, [place] Social Worker [phone]

## 2015-11-14 NOTE — Discharge Summary (Addendum)
Physician Discharge Summary  Wanda [name] [id number] DOB: May 17, 1951 DOA: 11/10/2015  PCP: JEFFERY,[name], PA-C  Admit date: 11/10/2015 Discharge date: 11/14/2015  Time spent: 45 minutes  Recommendations for Outpatient Follow-up:  1. PCP in 1 week 2. FU with Dr.Yan in 2 weeks for Relapsing and Remitting type MS/Tysabri injections   Discharge Diagnoses:      Acute confusional state   Acute encephalopathy   Hypertension   Multiple sclerosis ([place])   Hypokalemia   Low back pain   Shoulder pain   Discharge Condition: stable  Diet recommendation: heart healthy  Filed Weights   11/10/15 1455  Weight: 51.256 kg (113 lb)    History of present illness:  Chief Complaint: Confusion, falls HPI: Wanda [name] is a 65 y.o. female with PMH of hypertension, hyperlipidemia, and relapsing remitting type multiple sclerosis who presents to the ED after EMS was activated by her daughter for recurrent falls and confusional state. Patient had been admitted here in December 2016 under similar circumstances and was eventually discharged to SNF where she rehabilitated for 4 weeks prior to returning home approximately 10 days ago. Initially, upon her return home from rehabilitation, she was ambulating on her own and performing most of her ADLs independently. Over the ensuing week and a half however, her daughter reports multiple falls, this was typical for the patient. The day prior to her presentation, the patient called her daughter, reporting that she had fallen and needed help getting up. When her daughter arrived, the smoke alarm was going off and chicken was burning in the oven, with the patient lying on the floor unable to get up on her own  Hospital Course:  Acute confusional state/ Acute encephalopathy: Uncertain etiology, possibly high dose baclofen/Polypharmacy contributing, she was taking 90 mg in a day hence reduced her dose, complex partial seizure also suspected to be a possibility by  Neurology -improved back to baseline UA reassuring, chest x-ray clear, MRI no acute findings, it showed advanced white matter changes, compatible with chronic demyelination  RPR negative, B12 WNL,  TSH is borderline low recommend repeating in 6 weeks. -Neurology was consulted and recommended to increase Lamictal 225 mg twice a day, due to question of Seizure activity -H/o Positive JC virus, no evidence of PML on MRI, and unlikely to return to baseline so quickly if this was the case per Neurology -Physical therapy was consulted to recommend this skilled nursing facility for Rehab  Low back pain -due to MS most likely -improved with lidocaine patch, use low dose vicodin as needed for shoulder/back pains -SNF for Rehab  Essential Hypertension -Normotensive , stable off antihypertensive medication.  Multiple sclerosis ([place]): Relapsing remitting follow-up with Dr. Krista Blue as outpatient. She is undergoing Tysarbi infusion every monthly Has chronic R leg weakness  Hypokalemia: Repleted   Consultations:  Neurology  Discharge Exam: Filed Vitals:   11/13/15 2116 11/14/15 0458  BP: 137/73 122/70  Pulse: 64 64  Temp: 98.6 F (37 C) 98.3 F (36.8 C)  Resp: 18     General: AAOx3 Cardiovascular: S1S2/RRR Respiratory: CTAB  Discharge Instructions   Discharge Instructions    Diet - low sodium heart healthy    Complete by:  As directed      Increase activity slowly    Complete by:  As directed           Current Discharge Medication List    START taking these medications   Details  HYDROcodone-acetaminophen (NORCO/VICODIN) 5-325 MG tablet Take 1 tablet by  mouth every 4 (four) hours as needed for moderate pain. Qty: 30 tablet, Refills: 0    lamoTRIgine (LAMICTAL) 25 MG tablet Take 5 tablets (125 mg total) by mouth 2 (two) times daily.    lidocaine (LIDODERM) 5 % Place 1 patch onto the skin daily. Remove & Discard patch within 12 hours or as directed by MD Qty: 30 patch,  Refills: 0    senna-docusate (SENOKOT-S) 8.6-50 MG tablet Take 1 tablet by mouth at bedtime as needed for mild constipation or moderate constipation.      CONTINUE these medications which have CHANGED   Details  baclofen (LIORESAL) 20 MG tablet Take 2 tablets (40 mg total) by mouth 2 (two) times daily. Refills: 0    clonazePAM (KLONOPIN) 0.5 MG tablet Take 1 tablet (0.5 mg total) by mouth at bedtime as needed. For sleep Qty: 15 tablet, Refills: 0      CONTINUE these medications which have NOT CHANGED   Details  Cholecalciferol (VITAMIN D3) 2000 UNITS capsule Take 2,000 Units by mouth daily.    gatifloxacin (ZYMAXID) 0.5 % SOLN Place 1 drop into the left eye 4 (four) times daily. For one more week    natalizumab (TYSABRI) 300 MG/15ML injection Inject 300 mg into the vein every 30 (thirty) days.     prednisoLONE acetate (PRED FORTE) 1 % ophthalmic suspension Place 1 drop into the left eye 4 (four) times daily. For one more week Qty: 5 mL, Refills: 0    traZODone (DESYREL) 50 MG tablet Take 1 tablet by mouth at bedtime. Refills: 0    modafinil (PROVIGIL) 200 MG tablet Take 1 tablet (200 mg total) by mouth daily. Qty: 30 tablet, Refills: 5      STOP taking these medications     LamoTRIgine (LAMICTAL XR) 100 MG TB24      zolpidem (AMBIEN) 5 MG tablet        Allergies  Allergen Reactions  . Baclofen     Confusion, muscle weakness, lethargy   Follow-up Information    Follow up with [facility].   Why:  Will follow for RN HHA PT OT SW at discharge. If you need anything call Malachy Mood at [phone]   Contact information:   [place] [address] [phone]       Follow up with Marcial Pacas, MD. Schedule an appointment as soon as possible for a visit in 2 weeks.   Specialty:  Neurology   Contact information:   [place] [address] [phone]        The results of significant diagnostics from this  hospitalization (including imaging, microbiology, ancillary and laboratory) are listed below for reference.    Significant Diagnostic Studies: Dg Chest 2 View  11/10/2015  CLINICAL DATA:  Altered mental status, history of MS EXAM: CHEST  2 VIEW COMPARISON:  10/04/2015 FINDINGS: Lungs are clear.  No pleural effusion or pneumothorax. The heart is top-normal in size. Mild degenerative changes of the visualized thoracolumbar spine. Cholecystectomy clips. IMPRESSION: No evidence of acute cardiopulmonary disease. Electronically Signed   By: Julian Hy M.D.   On: 11/10/2015 18:03   Ct Head Wo Contrast  11/10/2015  CLINICAL DATA:  Confusion EXAM: CT HEAD WITHOUT CONTRAST TECHNIQUE: Contiguous axial images were obtained from the base of the skull through the vertex without intravenous contrast. COMPARISON:  None. FINDINGS: There is no evidence of mass effect, midline shift, or extra-axial fluid collections. There is no evidence of a space-occupying  lesion or intracranial hemorrhage. There is no evidence of a cortical-based area of acute infarction. There is generalized cerebral atrophy. There is periventricular white matter low attenuation likely secondary to microangiopathy. The ventricles and sulci are appropriate for the patient's age. The basal cisterns are patent. Visualized portions of the orbits are unremarkable. The visualized portions of the paranasal sinuses and mastoid air cells are unremarkable. Cerebrovascular atherosclerotic calcifications are noted. The osseous structures are unremarkable. IMPRESSION: 1. No acute intracranial pathology. 2. Chronic microvascular disease and cerebral atrophy. Electronically Signed   By: Kathreen Devoid   On: 11/10/2015 17:51   Mr Brain Wo Contrast  11/11/2015  CLINICAL DATA:  Altered mental status, discharged from rehabilitation less than 10 days ago. Acute encephalopathy, similar prior episodes. History of multiple sclerosis. EXAM: MRI HEAD WITHOUT CONTRAST  TECHNIQUE: Multiplanar, multiecho pulse sequences of the brain and surrounding structures were obtained without intravenous contrast. COMPARISON:  CT head September 08, 2016 and MRI head October 04, 2015 FINDINGS: Moderate ventriculomegaly on the basis of global parenchymal brain volume loss, advanced for age of stable from prior imaging. Patchy pontine and confluent supratentorial white matter T2 hyperintensities in addition to cystic periventricular lesions with low T1 signal consistent with black holes of demyelination. Stable lesion RIGHT basal ganglia. Subcentimeter LEFT subependymal lesion with crescentic susceptibility artifact is unchanged, nonspecific. Few additional punctate foci of susceptibility artifact, nonspecific. No abnormal extra-axial fluid collections. Normal major intracranial vascular flow voids present at the skull base. Ocular globes and orbital contents appear normal though, not tailored for assessment of optic neuritis. Paranasal sinuses and mastoid air cells are well aerated. No abnormal sellar expansion. No cerebellar tonsillar ectopia. No abnormal calvarial bone marrow signal. No abnormal mass lesions, mass effect. No reduced diffusion to suggest acute ischemia nor hyperacute demyelination. IMPRESSION: No acute intracranial process. Stable appearance of the brain: Advanced white matter changes compatible with chronic demyelination and moderate global parenchymal brain volume loss, advanced for age. Stable subcentimeter hemorrhagic periatrial subependymal nodule. Electronically Signed   By: Elon Alas M.D.   On: 11/11/2015 01:46    Microbiology: Recent Results (from the past 240 hour(s))  Urine culture     Status: None   Collection Time: 11/10/15  4:40 PM  Result Value Ref Range Status   Specimen Description URINE, CATHETERIZED  Final   Special Requests NONE  Final   Culture NO GROWTH 1 DAY  Final   Report Status 11/11/2015 FINAL  Final     Labs: Basic Metabolic  Panel:  Recent Labs Lab 11/10/15 1901 11/11/15 1033 11/11/15 2340 11/14/15 0717  NA 144  --  142 142  K 3.3*  --  3.2* 4.6  CL 107  --  109 109  CO2 24  --  23 22  GLUCOSE 68  --  106* 87  BUN 10  --  9 9  CREATININE 1.00  --  1.02* 0.85  CALCIUM 9.3  --  8.8* 9.4  MG  --  2.1  --   --    Liver Function Tests:  Recent Labs Lab 11/10/15 1901  AST 14*  ALT 11*  ALKPHOS 69  BILITOT 0.8  PROT 6.6  ALBUMIN 3.5    Recent Labs Lab 11/10/15 1901  LIPASE 17    Recent Labs Lab 11/10/15 1710  AMMONIA 42*   CBC:  Recent Labs Lab 11/10/15 1710 11/11/15 0200 11/11/15 2340 11/14/15 0717  WBC 6.6  --  5.6 6.4  HGB 12.8  --  10.5*  11.5*  HCT 38.4 30.4* 31.1* 35.5*  MCV 83.8  --  83.2 83.9  PLT 182  --  179 197   Cardiac Enzymes:  Recent Labs Lab 11/11/15 0020  TROPONINI <0.03   BNP: BNP (last 3 results) No results for input(s): BNP in the last 8760 hours.  ProBNP (last 3 results) No results for input(s): PROBNP in the last 8760 hours.  CBG:  Recent Labs Lab 11/10/15 1511  GLUCAP 72       Signed:  Sanjna Haskew MD.  [facility] 11/14/2015, 11:10 AM

## 2015-11-14 NOTE — Progress Notes (Signed)
Nsg Discharge Note  Admit Date:  11/10/2015 Discharge date: 11/14/2015   Rayne Du to be D/C'd SNFper MD order.  AVS completed.  Copy for chart, and copy for patient signed, and dated. Patient/caregiver able to verbalize understanding.  Discharge Medication:   Medication List    STOP taking these medications        LamoTRIgine 100 MG Tb24  Commonly known as:  LAMICTAL XR  Replaced by:  lamoTRIgine 25 MG tablet     zolpidem 5 MG tablet  Commonly known as:  AMBIEN      TAKE these medications        baclofen 20 MG tablet  Commonly known as:  LIORESAL  Take 2 tablets (40 mg total) by mouth 2 (two) times daily.     clonazePAM 0.5 MG tablet  Commonly known as:  KLONOPIN  Take 1 tablet (0.5 mg total) by mouth at bedtime as needed. For sleep     gatifloxacin 0.5 % Soln  Commonly known as:  ZYMAXID  Place 1 drop into the left eye 4 (four) times daily. For one more week     HYDROcodone-acetaminophen 5-325 MG tablet  Commonly known as:  NORCO/VICODIN  Take 1 tablet by mouth every 4 (four) hours as needed for moderate pain.     lamoTRIgine 25 MG tablet  Commonly known as:  LAMICTAL  Take 5 tablets (125 mg total) by mouth 2 (two) times daily.     lidocaine 5 %  Commonly known as:  LIDODERM  Place 1 patch onto the skin daily. Remove & Discard patch within 12 hours or as directed by MD     modafinil 200 MG tablet  Commonly known as:  PROVIGIL  Take 1 tablet (200 mg total) by mouth daily.     prednisoLONE acetate 1 % ophthalmic suspension  Commonly known as:  PRED FORTE  Place 1 drop into the left eye 4 (four) times daily. For one more week     senna-docusate 8.6-50 MG tablet  Commonly known as:  Senokot-S  Take 1 tablet by mouth at bedtime as needed for mild constipation or moderate constipation.     traZODone 50 MG tablet  Commonly known as:  DESYREL  Take 1 tablet by mouth at bedtime.     TYSABRI 300 MG/15ML injection  Generic drug:  natalizumab  Inject 300 mg  into the vein every 30 (thirty) days.     Vitamin D3 2000 units capsule  Take 2,000 Units by mouth daily.        Discharge Assessment: Filed Vitals:   11/14/15 0458 11/14/15 1349  BP: 122/70 135/78  Pulse: 64 73  Temp: 98.3 F (36.8 C) 98.3 F (36.8 C)  Resp:  20   Skin clean, dry and intact without evidence of skin break down, no evidence of skin tears noted. IV catheter discontinued intact. Site without signs and symptoms of complications - no redness or edema noted at insertion site, patient denies c/o pain - only slight tenderness at site.  Dressing with slight pressure applied.  D/c Instructions-Education: Discharge instructions given to patient/family with verbalized understanding. D/c education completed with patient/family including follow up instructions, medication list, d/c activities limitations if indicated, with other d/c instructions as indicated by MD - patient able to verbalize understanding, all questions fully answered. Patient instructed to return to ED, call 911, or call MD for any changes in condition.  Patient escorted via EMS, report called into [facility].   Saydi Kobel L,  RN 11/14/2015 3:13 PM

## 2015-11-15 DIAGNOSIS — H179 Unspecified corneal scar and opacity: Secondary | ICD-10-CM | POA: Diagnosis not present

## 2015-11-15 DIAGNOSIS — B300 Keratoconjunctivitis due to adenovirus: Secondary | ICD-10-CM | POA: Diagnosis not present

## 2015-11-18 ENCOUNTER — Telehealth: Payer: Self-pay

## 2015-11-18 NOTE — Telephone Encounter (Signed)
Pt's daughter ayana dropped off paperwork for independent assessment for PCS and disability placard. Paperwork placed in [name]'s box. Please fax "request for independent assessment for PC"S to [phone] and call when the "disability parking placard" paperwork is ready for pickup.  [phone]

## 2015-11-18 NOTE — Telephone Encounter (Signed)
Dr Everlene Farrier   Patient is at home - she fell yesterday - unable to care for her self.   Wanda [name] with home health care services wanted you to know she cannot take care of herself, they are trying to find a personal care assistant to help with her needs.   Also, daughter is going to asking you for a script for [employer].   Wanda [name]  [phone]

## 2015-11-19 ENCOUNTER — Emergency Department (HOSPITAL_COMMUNITY): Payer: Medicare Other

## 2015-11-19 ENCOUNTER — Emergency Department (HOSPITAL_COMMUNITY)
Admission: EM | Admit: 2015-11-19 | Discharge: 2015-11-20 | Disposition: A | Discharge status: Home / Self Care | Payer: Medicare Other | Attending: Emergency Medicine | Admitting: Emergency Medicine

## 2015-11-19 ENCOUNTER — Encounter (HOSPITAL_COMMUNITY): Payer: Self-pay

## 2015-11-19 DIAGNOSIS — F329 Major depressive disorder, single episode, unspecified: Secondary | ICD-10-CM | POA: Insufficient documentation

## 2015-11-19 DIAGNOSIS — M1990 Unspecified osteoarthritis, unspecified site: Secondary | ICD-10-CM | POA: Diagnosis not present

## 2015-11-19 DIAGNOSIS — T148 Other injury of unspecified body region: Secondary | ICD-10-CM | POA: Diagnosis not present

## 2015-11-19 DIAGNOSIS — R079 Chest pain, unspecified: Secondary | ICD-10-CM | POA: Diagnosis not present

## 2015-11-19 DIAGNOSIS — M545 Low back pain: Secondary | ICD-10-CM | POA: Diagnosis not present

## 2015-11-19 DIAGNOSIS — M546 Pain in thoracic spine: Secondary | ICD-10-CM | POA: Diagnosis not present

## 2015-11-19 DIAGNOSIS — S299XXA Unspecified injury of thorax, initial encounter: Secondary | ICD-10-CM | POA: Diagnosis not present

## 2015-11-19 DIAGNOSIS — Y9289 Other specified places as the place of occurrence of the external cause: Secondary | ICD-10-CM | POA: Insufficient documentation

## 2015-11-19 DIAGNOSIS — W010XXA Fall on same level from slipping, tripping and stumbling without subsequent striking against object, initial encounter: Secondary | ICD-10-CM | POA: Diagnosis not present

## 2015-11-19 DIAGNOSIS — S3992XA Unspecified injury of lower back, initial encounter: Secondary | ICD-10-CM | POA: Diagnosis present

## 2015-11-19 DIAGNOSIS — S199XXA Unspecified injury of neck, initial encounter: Secondary | ICD-10-CM | POA: Insufficient documentation

## 2015-11-19 DIAGNOSIS — S4992XA Unspecified injury of left shoulder and upper arm, initial encounter: Secondary | ICD-10-CM | POA: Diagnosis not present

## 2015-11-19 DIAGNOSIS — Z862 Personal history of diseases of the blood and blood-forming organs and certain disorders involving the immune mechanism: Secondary | ICD-10-CM | POA: Diagnosis not present

## 2015-11-19 DIAGNOSIS — Y9389 Activity, other specified: Secondary | ICD-10-CM | POA: Diagnosis not present

## 2015-11-19 DIAGNOSIS — I10 Essential (primary) hypertension: Secondary | ICD-10-CM | POA: Insufficient documentation

## 2015-11-19 DIAGNOSIS — F419 Anxiety disorder, unspecified: Secondary | ICD-10-CM | POA: Diagnosis not present

## 2015-11-19 DIAGNOSIS — M549 Dorsalgia, unspecified: Secondary | ICD-10-CM | POA: Diagnosis not present

## 2015-11-19 DIAGNOSIS — G35 Multiple sclerosis: Secondary | ICD-10-CM | POA: Diagnosis not present

## 2015-11-19 DIAGNOSIS — Z87891 Personal history of nicotine dependence: Secondary | ICD-10-CM | POA: Diagnosis not present

## 2015-11-19 DIAGNOSIS — T148XXA Other injury of unspecified body region, initial encounter: Secondary | ICD-10-CM

## 2015-11-19 DIAGNOSIS — Z79899 Other long term (current) drug therapy: Secondary | ICD-10-CM | POA: Diagnosis not present

## 2015-11-19 DIAGNOSIS — Z8742 Personal history of other diseases of the female genital tract: Secondary | ICD-10-CM | POA: Insufficient documentation

## 2015-11-19 DIAGNOSIS — Y998 Other external cause status: Secondary | ICD-10-CM | POA: Insufficient documentation

## 2015-11-19 DIAGNOSIS — M25512 Pain in left shoulder: Secondary | ICD-10-CM | POA: Diagnosis not present

## 2015-11-19 DIAGNOSIS — S29002A Unspecified injury of muscle and tendon of back wall of thorax, initial encounter: Secondary | ICD-10-CM | POA: Diagnosis not present

## 2015-11-19 DIAGNOSIS — S40012A Contusion of left shoulder, initial encounter: Secondary | ICD-10-CM | POA: Diagnosis not present

## 2015-11-19 DIAGNOSIS — Z8639 Personal history of other endocrine, nutritional and metabolic disease: Secondary | ICD-10-CM | POA: Insufficient documentation

## 2015-11-19 DIAGNOSIS — S300XXA Contusion of lower back and pelvis, initial encounter: Secondary | ICD-10-CM | POA: Diagnosis not present

## 2015-11-19 MED ORDER — KETOROLAC TROMETHAMINE 60 MG/2ML IM SOLN
60.0000 mg | Freq: Once | INTRAMUSCULAR | Status: AC
  Administered 2015-11-19: 60 mg via INTRAMUSCULAR
  Filled 2015-11-19: qty 2

## 2015-11-19 MED ORDER — LIDOCAINE 5 % EX PTCH: 1.0000 | MEDICATED_PATCH | CUTANEOUS | Status: DC

## 2015-11-19 MED ORDER — CYCLOBENZAPRINE HCL 10 MG PO TABS
10.0000 mg | ORAL_TABLET | Freq: Once | ORAL | Status: AC
  Administered 2015-11-19: 10 mg via ORAL
  Filled 2015-11-19: qty 1

## 2015-11-19 MED ORDER — CYCLOBENZAPRINE HCL 10 MG PO TABS: 10.0000 mg | ORAL_TABLET | Freq: Two times a day (BID) | ORAL | Status: DC | PRN

## 2015-11-19 MED ORDER — HYDROMORPHONE HCL 1 MG/ML IJ SOLN
1.0000 mg | Freq: Once | INTRAMUSCULAR | Status: AC
  Administered 2015-11-19: 1 mg via INTRAMUSCULAR
  Filled 2015-11-19: qty 1

## 2015-11-19 MED ORDER — NAPROXEN 500 MG PO TABS: 500.0000 mg | ORAL_TABLET | Freq: Two times a day (BID) | ORAL | Status: DC

## 2015-11-19 NOTE — Discharge Instructions (Signed)

## 2015-11-19 NOTE — Telephone Encounter (Signed)
Please call patient

## 2015-11-19 NOTE — Progress Notes (Signed)
CSW was consulted by EDP to speak with patient about facility information. Patient was not present. CSW spoke with nurse who states that patient has been transported for a scan.  CSW will continue to follow up.   Willette Brace [id number] ED CSW 11/19/2015 7:50 PM

## 2015-11-19 NOTE — Progress Notes (Signed)
CSW met with patient at bedside. No family was present. Patient states that she presents to [facility] due to back pain.   Patient informed CSW that she lives home alone in [place]. Patient states that her primary support is her daughter who lives 10 minutes away from her.  Patient states that she walks with walker. According to patient, she receives home health services. However, she was not able to inform CSW of which agency. She states she believes it may be advanced home health.  Patient gave CSW permission to speak with daughter. CSW called daughter. Daughter states she is interested in patient going to a SNF to assist with patient's care due to her falling recently.  CSW provided patient with a list of SNF. CSW consulted with Nurse CM to inquire if it is possible for patient to get a social worker added to her home  Health care plan ,if a plan does exist.    , LCSWA [phone] ED CSW 11/19/2015 8:54 PM    

## 2015-11-19 NOTE — ED Provider Notes (Signed)
CSN: [id number]     Arrival date & time 11/19/15  1111 History   First MD Initiated Contact with Patient 11/19/15 1842     Chief Complaint  Patient presents with  . Back Pain     (Consider location/radiation/quality/duration/timing/severity/associated sxs/prior Treatment) HPI Comments: Can't sit up, fell a few weeks ago, tripped over vacuum cleaner and fell on left side Went to [facility], admitted (for acute confusional state) Got lidocaine patch, felt better and was discharged Has not taken any medications for pain since return home Both shoulders hurt, left worse than right Left rib pain Lower back pain Pain came back yesterday (after being present weeks ago) Bruise left shoulder No new trauma No new numbness/weaknesss No urinary retention/leaking. Can't make it to bathroom in time. No loss control bowel Daughter concerned pt can't care for self at home. She was discharged to SNF from hospital however could not stay given was hosp observation stay.  Pt reports using walker to get around, however severe pain with sitting up since yesterday.  Pt and daughte rinterested in rehab/SNF placement.   Patient is a 65 y.o. female presenting with back pain.  Back Pain Associated symptoms: no abdominal pain, no chest pain, no dysuria, no fever and no headaches     Past Medical History  Diagnosis Date  . Elevated cholesterol   . Hypertension   . Arthritis   . Cervical dysplasia   . Depression   . Anxiety   . Macular degeneration of right eye 2001  . MS (multiple sclerosis) ([place]) 08/2013  . Acute encephalopathy 03/12/2015  . Anemia    Past Surgical History  Procedure Laterality Date  . Colposcopy    . Knee surgery Bilateral     "had cortisone injections in my knees"  . Cholecystectomy    . Tubal ligation    . Tonsillectomy    . Colonoscopy w/ polypectomy     Family History  Problem Relation Age of Onset  . Cancer Mother     Colon  . Diabetes Mother   . Hypertension Mother    . Arthritis Mother   . Cancer Father     prostate  . Kidney disease Brother     congenital single kidney  . Arthritis Sister   . Arthritis Sister   . Hematuria Son   . Gout Brother   . Multiple sclerosis Brother   . Arthritis Brother   . HIV Brother   . Cancer Brother     spinal   Social History  Substance Use Topics  . Smoking status: Former Smoker -- 1.00 packs/day for 45 years    Types: Cigarettes    Quit date: 10/23/2013  . Smokeless tobacco: Never Used     Comment: Quit smoking in 2014.  Marland Kitchen Alcohol Use: 0.0 oz/week    0 Standard drinks or equivalent per week     Comment: hasn't drank anything since jan 2015   OB History    Gravida Para Term Preterm AB TAB SAB Ectopic Multiple Living   6 3 3  3  3   2      Review of Systems  Constitutional: Negative for fever.  HENT: Negative for sore throat.   Eyes: Negative for visual disturbance.  Respiratory: Negative for cough and shortness of breath.   Cardiovascular: Negative for chest pain.  Gastrointestinal: Negative for nausea, vomiting and abdominal pain.  Genitourinary: Negative for dysuria and difficulty urinating.  Musculoskeletal: Positive for back pain. Negative for neck pain.  Skin: Negative for rash.  Neurological: Negative for syncope and headaches.      Allergies  Baclofen  Home Medications   Prior to Admission medications   Medication Sig Start Date End Date Taking? Authorizing Provider  baclofen (LIORESAL) 20 MG tablet Take 2 tablets (40 mg total) by mouth 2 (two) times daily. 11/14/15  Yes Domenic Polite, MD  Cholecalciferol (VITAMIN D3) 2000 UNITS capsule Take 2,000 Units by mouth daily.   Yes Historical Provider, MD  gatifloxacin (ZYMAXID) 0.5 % SOLN Place 1 drop into the left eye 4 (four) times daily. For one more week 10/08/15  Yes Thurnell Lose, MD  lamoTRIgine (LAMICTAL) 25 MG tablet Take 5 tablets (125 mg total) by mouth 2 (two) times daily. 11/14/15  Yes Domenic Polite, MD  natalizumab  (TYSABRI) 300 MG/15ML injection Inject 300 mg into the vein every 30 (thirty) days.    Yes Historical Provider, MD  prednisoLONE acetate (PRED FORTE) 1 % ophthalmic suspension Place 1 drop into the left eye 4 (four) times daily. For one more week 10/08/15  Yes Thurnell Lose, MD  traZODone (DESYREL) 50 MG tablet Take 1 tablet by mouth at bedtime. 10/24/15  Yes Historical Provider, MD  clonazePAM (KLONOPIN) 0.5 MG tablet Take 1 tablet (0.5 mg total) by mouth at bedtime as needed. For sleep 11/14/15   Domenic Polite, MD  cyclobenzaprine (FLEXERIL) 10 MG tablet Take 1 tablet (10 mg total) by mouth 2 (two) times daily as needed for muscle spasms. 11/19/15   Gareth Morgan, MD  HYDROcodone-acetaminophen (NORCO/VICODIN) 5-325 MG tablet Take 1 tablet by mouth every 4 (four) hours as needed for moderate pain. 11/14/15   Domenic Polite, MD  lidocaine (LIDODERM) 5 % Place 1 patch onto the skin daily. Remove & Discard patch within 12 hours or as directed by MD 11/19/15   Gareth Morgan, MD  modafinil (PROVIGIL) 200 MG tablet Take 1 tablet (200 mg total) by mouth daily. 10/01/15   Marcial Pacas, MD  naproxen (NAPROSYN) 500 MG tablet Take 1 tablet (500 mg total) by mouth 2 (two) times daily with a meal. 11/19/15   Gareth Morgan, MD  senna-docusate (SENOKOT-S) 8.6-50 MG tablet Take 1 tablet by mouth at bedtime as needed for mild constipation or moderate constipation. 11/14/15   Domenic Polite, MD   BP 140/92 mmHg  Pulse 72  Temp(Src) 98.1 F (36.7 C) (Oral)  Resp 16  SpO2 98% Physical Exam  Constitutional: She is oriented to person, place, and time. She appears well-developed and well-nourished. No distress.  HENT:  Head: Normocephalic and atraumatic.  Eyes: Conjunctivae and EOM are normal.  Neck: Normal range of motion.  Cardiovascular: Normal rate, regular rhythm, normal heart sounds and intact distal pulses.  Exam reveals no gallop and no friction rub.   No murmur heard. Pulmonary/Chest: Effort normal and breath  sounds normal. No respiratory distress. She has no wheezes. She has no rales. She exhibits tenderness (left lateral).  Abdominal: Soft. She exhibits no distension. There is no tenderness. There is no guarding.  Musculoskeletal: She exhibits no edema.       Left shoulder: She exhibits tenderness, bony tenderness, pain and spasm. She exhibits normal range of motion, no laceration, normal pulse and normal strength.       Cervical back: She exhibits bony tenderness.       Thoracic back: She exhibits bony tenderness.       Lumbar back: She exhibits bony tenderness.  Neurological: She is alert and oriented to person,  place, and time. A sensory deficit (right lower and upper ext, reports chronic) is present. No cranial nerve deficit. GCS eye subscore is 4. GCS verbal subscore is 5. GCS motor subscore is 6.  Right sided weakness, chronic per pt Able to ambulate with steady gait independently with walker   Skin: Skin is warm and dry. No rash noted. She is not diaphoretic. No erythema.  Nursing note and vitals reviewed.   ED Course  Procedures (including critical care time) Labs Review Labs Reviewed - No data to display  Imaging Review Dg Ribs Unilateral W/chest Left  11/19/2015  CLINICAL DATA:  65 year old female with fall and left-sided chest pain. EXAM: LEFT RIBS AND CHEST - 3+ VIEW COMPARISON:  Radiograph dated 11/10/2015 FINDINGS: No fracture or other bone lesions are seen involving the ribs. There is no evidence of pneumothorax or pleural effusion. Both lungs are clear. Heart size and mediastinal contours are within normal limits. IMPRESSION: No rib fracture or pneumothorax. Electronically Signed   By: Anner Crete M.D.   On: 11/19/2015 20:07   Dg Thoracic Spine 2 View  11/19/2015  CLINICAL DATA:  Shoulder and left side pain since a fall when the patient tripped over and vacuum cleaner 2 weeks ago. Initial encounter. EXAM: THORACIC SPINE 2 VIEWS COMPARISON:  PA and lateral chest 11/10/2015.  FINDINGS: No fracture or malalignment is identified. Multilevel thoracic spondylosis is seen. Paraspinous structures are unremarkable. IMPRESSION: No acute abnormality. Electronically Signed   By: Inge Rise M.D.   On: 11/19/2015 20:07   Dg Lumbar Spine Complete  11/19/2015  CLINICAL DATA:  Left back pain EXAM: LUMBAR SPINE - COMPLETE 4+ VIEW COMPARISON:  01/30/2009 FINDINGS: No vertebral compression. Anatomic alignment. Disc space narrowing L2-3, L3-4, and L5-S1 is noted. This has progressed at L5-S1. No definite acute fracture. Facet arthropathy throughout the mid and lower lumbar spine. Calcified fibroids project over the pelvis. IMPRESSION: No acute bony pathology.  Chronic changes. Electronically Signed   By: Marybelle Killings M.D.   On: 11/19/2015 20:11   Ct Cervical Spine Wo Contrast  11/19/2015  CLINICAL DATA:  Back pain EXAM: CT CERVICAL SPINE WITHOUT CONTRAST TECHNIQUE: Multidetector CT imaging of the cervical spine was performed without intravenous contrast. Multiplanar CT image reconstructions were also generated. COMPARISON:  None. FINDINGS: No acute fracture. No dislocation. There is moderate narrowing of the C5-6, C6-7, and C7-T1 discs. Bilateral foraminal narrowing occurs at these levels secondary to uncovertebral osteophytes. The thyroid gland is heterogeneous. Areas of low density are present in the left lobe with calcification. Nodules are not excluded. Lung apices are grossly clear. IMPRESSION: No acute bony injury in the cervical spine Degenerative changes as described Possible left-sided thyroid nodules.  Ultrasound may be helpful. Electronically Signed   By: Marybelle Killings M.D.   On: 11/19/2015 19:42   Dg Shoulder Left  11/19/2015  CLINICAL DATA:  65 year old female with fall and left shoulder pain EXAM: LEFT SHOULDER - 2+ VIEW COMPARISON:  Chest radiograph dated 11/19/2015 FINDINGS: There is no acute fracture or dislocation of the left shoulder. There is mild degenerative changes of  the inferior aspect of the glenoid rim. The soft tissues are grossly unremarkable. IMPRESSION: No acute fracture or dislocation. Electronically Signed   By: Anner Crete M.D.   On: 11/19/2015 20:06   I have personally reviewed and evaluated these images and lab results as part of my medical decision-making.   EKG Interpretation None      MDM   Final diagnoses:  Back pain  Contusion   65 year old female with a history of hypertension, hypercholesterolemia, multiple sclerosis, recent admission for acute confusional state presents with concern for left shoulder pain, neck pain and back pain. CT cervical spine showed no acute abnormalities. X-ray of the chest, thoracic spine, lumbar spine showed no acute abnormalities.  Patient's neurologic exam is at baseline. She does not have any symptoms to suggest cauda equina. Pain is musculoskeletal given significant tenderness with palpation.  Likely contusion from fall.  Lower back pain and Hospital thought to be likely secondary to patient's MS. Patient vomited taking pain medication since she returned home. Daughters concerned that she is unable to properly care for herself.  Discussed with social work and case management, however patient does not meet criteria for inpatient admission, and we'll have to continue to workup possible rehabilitation placement from home. Patient is able to ambulate with her walker, and arrange for home health, PT, OT, RN and social work. Patient was given lidocaine patch, naproxen and Flexeril prescriptions and home health to see pt.     Gareth Morgan, MD 11/20/15 [phone]

## 2015-11-19 NOTE — Progress Notes (Signed)
Patient witnessed ambulating in hallway with walker with minimal assist.

## 2015-11-19 NOTE — Care Management Note (Addendum)
Case Management Note  Patient Details  Name: Wanda [name] MRN: [id number] Date of Birth: [birth year]  Subjective/Objective:   Patient presents to Ed with back pain                 Action/Plan: Patient to be discharged home with home health services.  Patient lives alone.  She reports her neighbor, "Is a good neighbor,"  And checks in on her often.  She lies upstairs.  Patient reports her daughter checks in on her in the morning and at night daily.  Patient reports she has a walker at home.  Patient reports she does  not want to stay with her daughter.  She reports she would like short term rehab to get stronger to be able to live independently.  See social work note.  Patient reports she went to [facility] for one night and reports, "They didn't have the correct paper work, so they sent me home the next day."   Chart review reveals patient's daughter has placed request to patient's pcp for PCS services.  Patient is awake alert and oriented x4.  Patient reports she did not fall today.  She reports, "On Sunday, I didn't lift my walker high enough so I tripped.  It was me just being stupid."  Patient reports she is able to ambulate with her walker as witnessed in ED, takes her dogs out every day and tries to remain active.  EDCM discussed patient with EDP who has placed orders for home health RN, PT, OT aide and social work.  Patient thinks she has [facility] for home health currently.  [facility] faxed home health orders to [facility] with confirmation of receipt.  EDCM also provided patient with list of private duty nursing agencies, senior resources of [place], [facility] consult placed, contact information for care patrol.  Patient thankful for resources.  No further EDCM needs at this time.   Expected Discharge Date:                  Expected Discharge Plan:  [place]  In-House Referral:  Clinical Social Work  Discharge planning Services  CM Consult  Post Acute Care Choice:  Home  Health Choice offered to:  Patient  DME Arranged:    DME Agency:     HH Arranged:  RN, PT, OT, Nurse's Aide, Social Work [employer] Agency:  [place]  Status of Service:     Medicare Important Message Given:    Date Medicare IM Given:    Medicare IM give by:    Date Additional Medicare IM Given:    Additional Medicare Important Message give by:     If discussed at [place] of Stay Meetings, dates discussed:    Additional CommentsLivia Snellen, RN 11/19/2015, 10:34 PM

## 2015-11-19 NOTE — ED Notes (Signed)
Pt c/o generalized back pain x "a couple weeks."  Pain score 10/10.  Pt has not taken anything for pain.  Denies injury.  Sts "I woke up with the pain while I was in the hospital."  Hx of MS.

## 2015-11-19 NOTE — Telephone Encounter (Signed)
Forms completed.  Handicap placard request is fully complete.  The The request for independent assessment for PCS still needs: SECTION C: Practice NPI #, Practice Stamp  I performed this task at 104, and will bring to 102 after clinic.

## 2015-11-20 MED ORDER — ASPIRIN 81 MG PO CHEW: 324.0000 mg | CHEWABLE_TABLET | Freq: Once | ORAL | Status: DC

## 2015-11-20 MED ORDER — NITROGLYCERIN 0.4 MG SL SUBL: 0.4000 mg | SUBLINGUAL_TABLET | SUBLINGUAL | Status: DC | PRN

## 2015-11-20 NOTE — Progress Notes (Signed)
EDCM spoke to Santiago Glad of [facility] who confirms referral for home health services has been received and will begin services.

## 2015-11-20 NOTE — Telephone Encounter (Signed)
I will anticipate [name]'s visit today, hopefully with the patient. This is a decision best made together.

## 2015-11-20 NOTE — Telephone Encounter (Signed)
Pt's daugher called back again wanting to speak with Harrison Mons regarding her mother.  She would like to have her put into a skilled facility.  I looked for the release of information on the pt's chart however, the daughter is not listed.  I advised the pt that I could not give information but she did confirm she is the power of attorney. I told her she would need to bring it the next time she comes so that we can update it.  I also told her if she is looking to epidote this situation is to come to the walk in clinic and speak with [name].  She understood.  I gave her [place] hours (1-close) for today 11/20/2015.  She understood.

## 2015-11-20 NOTE — Consult Note (Signed)
   [facility] Inpatient Consult   11/20/2015  Wanda [name] [birth year] [id number]   Thank you for this consult.   This patient is not eligible for [facility] Care Management Services.  Reason:  Not a beneficiary currently attributed to one of the [place].  Membership roster used to verify non- eligible status.  [name], MSN-Ed, RN,BSN [facility] Liaison [phone]

## 2015-11-20 NOTE — Telephone Encounter (Signed)
Pt's daughter would like a CB concerning her mom. She has just gotten out of the hospital for falling again and she was not able to get into a rehab center. She would like her mom to be put into a skilled facility. Her daughter's name is [employer]. A good CB # for her is [phone]

## 2015-11-20 NOTE — ED Notes (Signed)
Patient is alert and oriented x3.  She was given DC instructions and follow up visit instructions.  Patient gave verbal understanding. She was DC ambulatory under her own power to home.  V/S stable.  He was not showing any signs of distress on DC 

## 2015-11-21 ENCOUNTER — Telehealth: Payer: Self-pay

## 2015-11-21 NOTE — Telephone Encounter (Signed)
Wanda [name] is calling to request verbal orders for pt twice a week for 6 weeks. Please call and leave a voicemail ! [phone]

## 2015-11-22 NOTE — Telephone Encounter (Signed)
Please advise 

## 2015-11-24 DIAGNOSIS — M150 Primary generalized (osteo)arthritis: Secondary | ICD-10-CM | POA: Diagnosis not present

## 2015-11-24 DIAGNOSIS — F419 Anxiety disorder, unspecified: Secondary | ICD-10-CM | POA: Diagnosis not present

## 2015-11-24 DIAGNOSIS — Z87891 Personal history of nicotine dependence: Secondary | ICD-10-CM | POA: Diagnosis not present

## 2015-11-24 DIAGNOSIS — R296 Repeated falls: Secondary | ICD-10-CM | POA: Diagnosis not present

## 2015-11-24 DIAGNOSIS — E785 Hyperlipidemia, unspecified: Secondary | ICD-10-CM | POA: Diagnosis not present

## 2015-11-24 DIAGNOSIS — D649 Anemia, unspecified: Secondary | ICD-10-CM | POA: Diagnosis not present

## 2015-11-24 DIAGNOSIS — Z9181 History of falling: Secondary | ICD-10-CM | POA: Diagnosis not present

## 2015-11-24 DIAGNOSIS — G35 Multiple sclerosis: Secondary | ICD-10-CM | POA: Diagnosis not present

## 2015-11-24 DIAGNOSIS — Z87440 Personal history of urinary (tract) infections: Secondary | ICD-10-CM | POA: Diagnosis not present

## 2015-11-24 DIAGNOSIS — M545 Low back pain: Secondary | ICD-10-CM | POA: Diagnosis not present

## 2015-11-24 DIAGNOSIS — F329 Major depressive disorder, single episode, unspecified: Secondary | ICD-10-CM | POA: Diagnosis not present

## 2015-11-24 DIAGNOSIS — I10 Essential (primary) hypertension: Secondary | ICD-10-CM | POA: Diagnosis not present

## 2015-11-25 ENCOUNTER — Telehealth: Payer: Self-pay | Admitting: Neurology

## 2015-11-25 ENCOUNTER — Telehealth: Payer: Self-pay

## 2015-11-25 ENCOUNTER — Encounter (HOSPITAL_COMMUNITY): Payer: Self-pay | Admitting: Emergency Medicine

## 2015-11-25 ENCOUNTER — Telehealth: Payer: Self-pay | Admitting: Physician Assistant

## 2015-11-25 ENCOUNTER — Emergency Department (HOSPITAL_COMMUNITY)
Admission: EM | Admit: 2015-11-25 | Discharge: 2015-11-25 | Disposition: A | Discharge status: Home / Self Care | Payer: Medicare Other | Attending: Emergency Medicine | Admitting: Emergency Medicine

## 2015-11-25 ENCOUNTER — Emergency Department (HOSPITAL_COMMUNITY): Payer: Medicare Other

## 2015-11-25 DIAGNOSIS — Y9289 Other specified places as the place of occurrence of the external cause: Secondary | ICD-10-CM | POA: Insufficient documentation

## 2015-11-25 DIAGNOSIS — M1990 Unspecified osteoarthritis, unspecified site: Secondary | ICD-10-CM | POA: Insufficient documentation

## 2015-11-25 DIAGNOSIS — Z862 Personal history of diseases of the blood and blood-forming organs and certain disorders involving the immune mechanism: Secondary | ICD-10-CM | POA: Diagnosis not present

## 2015-11-25 DIAGNOSIS — Z87891 Personal history of nicotine dependence: Secondary | ICD-10-CM | POA: Insufficient documentation

## 2015-11-25 DIAGNOSIS — Z8742 Personal history of other diseases of the female genital tract: Secondary | ICD-10-CM | POA: Insufficient documentation

## 2015-11-25 DIAGNOSIS — E7800 Pure hypercholesterolemia, unspecified: Secondary | ICD-10-CM | POA: Diagnosis not present

## 2015-11-25 DIAGNOSIS — Y998 Other external cause status: Secondary | ICD-10-CM | POA: Diagnosis not present

## 2015-11-25 DIAGNOSIS — S3992XA Unspecified injury of lower back, initial encounter: Secondary | ICD-10-CM | POA: Diagnosis not present

## 2015-11-25 DIAGNOSIS — R531 Weakness: Secondary | ICD-10-CM | POA: Insufficient documentation

## 2015-11-25 DIAGNOSIS — W01198A Fall on same level from slipping, tripping and stumbling with subsequent striking against other object, initial encounter: Secondary | ICD-10-CM | POA: Insufficient documentation

## 2015-11-25 DIAGNOSIS — F419 Anxiety disorder, unspecified: Secondary | ICD-10-CM | POA: Diagnosis not present

## 2015-11-25 DIAGNOSIS — F329 Major depressive disorder, single episode, unspecified: Secondary | ICD-10-CM | POA: Diagnosis not present

## 2015-11-25 DIAGNOSIS — R404 Transient alteration of awareness: Secondary | ICD-10-CM | POA: Diagnosis not present

## 2015-11-25 DIAGNOSIS — Y9389 Activity, other specified: Secondary | ICD-10-CM | POA: Insufficient documentation

## 2015-11-25 DIAGNOSIS — Z791 Long term (current) use of non-steroidal anti-inflammatories (NSAID): Secondary | ICD-10-CM | POA: Diagnosis not present

## 2015-11-25 DIAGNOSIS — Z8669 Personal history of other diseases of the nervous system and sense organs: Secondary | ICD-10-CM | POA: Diagnosis not present

## 2015-11-25 DIAGNOSIS — R079 Chest pain, unspecified: Secondary | ICD-10-CM | POA: Diagnosis not present

## 2015-11-25 DIAGNOSIS — W19XXXA Unspecified fall, initial encounter: Secondary | ICD-10-CM

## 2015-11-25 DIAGNOSIS — Z79899 Other long term (current) drug therapy: Secondary | ICD-10-CM | POA: Insufficient documentation

## 2015-11-25 DIAGNOSIS — M545 Low back pain: Secondary | ICD-10-CM | POA: Diagnosis not present

## 2015-11-25 DIAGNOSIS — I10 Essential (primary) hypertension: Secondary | ICD-10-CM | POA: Insufficient documentation

## 2015-11-25 LAB — CBC
HEMATOCRIT: 35.4 % — AB (ref 36.0–46.0)
Hemoglobin: 11.2 g/dL — ABNORMAL LOW (ref 12.0–15.0)
MCH: 27.7 pg (ref 26.0–34.0)
MCHC: 31.6 g/dL (ref 30.0–36.0)
MCV: 87.4 fL (ref 78.0–100.0)
PLATELETS: 235 10*3/uL (ref 150–400)
RBC: 4.05 MIL/uL (ref 3.87–5.11)
RDW: 15.5 % (ref 11.5–15.5)
WBC: 10.7 10*3/uL — ABNORMAL HIGH (ref 4.0–10.5)

## 2015-11-25 LAB — BASIC METABOLIC PANEL
Anion gap: 8 (ref 5–15)
BUN: 13 mg/dL (ref 6–20)
CHLORIDE: 108 mmol/L (ref 101–111)
CO2: 24 mmol/L (ref 22–32)
CREATININE: 0.97 mg/dL (ref 0.44–1.00)
Calcium: 9.1 mg/dL (ref 8.9–10.3)
GFR calc non Af Amer: 60 mL/min — ABNORMAL LOW (ref >60)
Glucose, Bld: 87 mg/dL (ref 65–99)
Potassium: 3.9 mmol/L (ref 3.5–5.1)
Sodium: 140 mmol/L (ref 135–145)

## 2015-11-25 LAB — CBG MONITORING, ED: Glucose-Capillary: 68 mg/dL (ref 65–99)

## 2015-11-25 NOTE — Progress Notes (Signed)
ED CM consulted by ED RN after pt's dtr ask to speak with CM CM noted Cm consult for Nursing home placement Cm spoke with dtr who was sitting in a chair in front of pt room !6 in [facility] ED Asked if she needed CM for nursing home placement and was informed yes and in private  Cm informed daughter Cm would need to speak with pt to get permission to speak with dtr in private Dtr with [employer] employee badge intact Cm and dtr entered pt's room Dtr sat in corner.  CM asked pt to identify dtr and she did plus told cm her daughter (dtr) name and that dtr was her youngest daughter Pt joked with Cm about pt's short hair.  When Cm asked pt the date she first smiled and rolled her eyes, giggled when Cm told her CM was sorry but she did need to answer some questions for CM Pt stated it was "February twenth", "saturday", and she was at "[place]" Pt gave Cm permission to speak with her daughter in private Cm and dtr spoke in the ED conference room Dtr reports pt last in ED on February 15th, pt had been at [employer] [facility] home care SW/"administrator was working with me" to try to get pt to snf States pt's pcp gave her at "FL2 to get the emergency room to sign" Reports pt lives alone and was found today by a neighbor covered in feces Daughter insisted CM look at 2 videos on dtr cell of pt - one with pt in bed, dtr speaking with her and pt closed her eyes, dtr asked her to open eyes and attempted to speak with pt. Second pt on toilet preparing to wipe herself and pt not wanting to look into pt phone camera.   Dtr asked for direction CM discussed in [place] if pt is alert and oriented CM has to follow pt wishes until found not to be competent in making her own decisions CM discussed how HHSW can assist pcp to get pt into snf from community level  CM discussed that [facility] ED is an outpatient setting in which EDP/NP/PAs evaluate pts for a medical plan to be followed up in the community by pcp or for pt  to be admitted by a hospital admitting MD if medical reason for admission found Reports pt has various home health agencies calling to attempt to take care of pt and not sure which pt is active with "advanced home care, [facility], [facility]" CM discussed APS (adult protective services) via DSS that may provide help for pts living alone & struggling with routine activities and may benefit from services to maintain their health and safety  CM discussed guardianship in [place] Before Cm could state 2 sentences pt dt stated "i can't take care of her" "she can't live with me"   Cm informed her Cm did not say pt had to live with her nor did Cm say pt had to be taken care of by dtr  Cm informed her she would like ED SW explain more to pt Dtr states she had "spoken with [place] before" Dtr left in ED Conference room attempting to reach pcp office   ED CM updated ED SW who confirms she has spoke with [employer] dtr before

## 2015-11-25 NOTE — Telephone Encounter (Signed)
YES!  PT twice a week x 6 weeks.  Left verbal order on VM as requested.

## 2015-11-25 NOTE — Telephone Encounter (Signed)
Patient is not being admitted to the hospital and will be here for her appt on 11/26/15.

## 2015-11-25 NOTE — Progress Notes (Signed)
Spoke with daughter again and provided her with medicaid guardianship information  Dtr states she called pcp, APS and ED SW to assist with FL2 - No further questions and Cm noted dtr smiling  Dtr also stated pt had not been evaluated by a neurologist in the community only while hospitalized and no neurological issues found per dtr

## 2015-11-25 NOTE — Discharge Instructions (Signed)
Take your pain medication as needed for back pain.  Follow up with your doctor to determine placement to a skill nursing facility if you need additional help with activities of daily living.    Fatigue Fatigue is feeling tired all of the time, a lack of energy, or a lack of motivation. Occasional or mild fatigue is often a normal response to activity or life in general. However, long-lasting (chronic) or extreme fatigue may indicate an underlying medical condition. HOME CARE INSTRUCTIONS  Watch your fatigue for any changes. The following actions may help to lessen any discomfort you are feeling:  Talk to your health care provider about how much sleep you need each night. Try to get the required amount every night.  Take medicines only as directed by your health care provider.  Eat a healthy and nutritious diet. Ask your health care provider if you need help changing your diet.  Drink enough fluid to keep your urine clear or pale yellow.  Practice ways of relaxing, such as yoga, meditation, massage therapy, or acupuncture.  Exercise regularly.   Change situations that cause you stress. Try to keep your work and personal routine reasonable.  Do not abuse illegal drugs.  Limit alcohol intake to no more than 1 drink per day for nonpregnant women and 2 drinks per day for men. One drink equals 12 ounces of beer, 5 ounces of wine, or 1 ounces of hard liquor.  Take a multivitamin, if directed by your health care provider. SEEK MEDICAL CARE IF:   Your fatigue does not get better.  You have a fever.   You have unintentional weight loss or gain.  You have headaches.   You have difficulty:   Falling asleep.  Sleeping throughout the night.  You feel angry, guilty, anxious, or sad.   You are unable to have a bowel movement (constipation).   You skin is dry.   Your legs or another part of your body is swollen.  SEEK IMMEDIATE MEDICAL CARE IF:   You feel confused.    Your vision is blurry.  You feel faint or pass out.   You have a severe headache.   You have severe abdominal, pelvic, or back pain.   You have chest pain, shortness of breath, or an irregular or fast heartbeat.   You are unable to urinate or you urinate less than normal.   You develop abnormal bleeding, such as bleeding from the rectum, vagina, nose, lungs, or nipples.  You vomit blood.   You have thoughts about harming yourself or committing suicide.   You are worried that you might harm someone else.    This information is not intended to replace advice given to you by your health care provider. Make sure you discuss any questions you have with your health care provider.   Document Released: 07/19/2007 Document Revised: 10/12/2014 Document Reviewed: 01/23/2014 Elsevier Interactive Patient Education [employer].

## 2015-11-25 NOTE — ED Notes (Addendum)
Cannot complete in and out due to patient continually having bowl movement every time we try to move her. Patient stated to this tech that she can not walk without a walker and 2 staff members. Also stated that she needs extra chucks under her because she cant go to xray without them. Patient is currently clean with 2 chucks and a adult diaper on. Xray came out of room to this tech and stated that the patient stated that she needs a adult diaper before they can scan her at patients request. This tech instructed xray to take patient and that she was clean and had a diaper on.

## 2015-11-25 NOTE — Telephone Encounter (Signed)
Patient's daughter called to follow-up regarding the request for skilled nursing for her mother.  She said she came in to deliver the power of attorney papers on 2/15, but her mother was unable to accompany her as [name] hoped.  The patient's daughter is extremely concerned because her mother has been in the hospital several times this year, and it has become a cycle.  The patient is not capable of making decisions, per the daughter, nor can she physically go out.  Please advise, thank you.  CB#: [phone]

## 2015-11-25 NOTE — ED Notes (Signed)
Pt taken to x-ray and returned to room without distress noted. 

## 2015-11-25 NOTE — Progress Notes (Signed)
CSW met with patient at bedside and asked if it was okay to speak with daughter. Patient states CSW can speak with daughter. Also, patient stated " She is trying to tell everybody its the backlofen that's got me like this". CSW asked patient to clarify what she meant by stating "got me like this". Patient says that she is referring to her weakness. Patient states she has been taking the medication since 2015.  CSW met with daughter in [place] conference room. Daughter states that she does not feel patient should live alone. Daughter states that she cannot take care of patient.   Daughter states that patient has home health, and states that patient has a Education officer, museum included in her plan. CSW informed daughter that patient cannot be placed from [facility]. CSW informed daughter that [place] worker will be able to assist patient from community with finding a facility if the patient is interested. CSW informed daughter that patient has the right to inform her health care team of what she is interested in.  Daughter states that she is currently the patient's POA. However, she states that during another hospital; the patient told her she did not want her to be her POA anymore.  Daughter states that when patient last went to [facility] they did not keep her due to patient not being admitted to hospital. [place] educated daughter about medicare process.  CSW also staffed case with CSW Surveyor, quantity who spoke with Liaison/Karen. Santiago Glad states patient did not have the right type of medicaid that would have been able to cover the patient's stay.  Daughter showed CSW and "FL2". Daughter states she printed out a PDF she saw online.  Willette Brace [phone] ED CSW 11/25/2015 9:47 PM    Willette Brace [phone] ED CSW 11/25/2015 9:46 PM

## 2015-11-25 NOTE — Telephone Encounter (Signed)
Received fax that Attestation of Medical Need for Independent Assessment cannot be processed due to : 1. Missing Medically Stable 2. Other/Invalid ICD10 codes  I spoke with a customer service representative.  The Medically stable portion IS completed on the form. That is was missing was an error on THEIR part.  The form we used is the old form, asking for ICD-9 codes, which they can no longer accept. I am to re-complete the form with ICD-10 codes.

## 2015-11-25 NOTE — ED Notes (Signed)
PT did ambulate in hall once back in room info PT i will update PA on how she did while walking with walker. PT stated how will he know if he did not even see her because that is not the walker she use and can not walk that well. PA Bowie did watch PT enter room while using walker

## 2015-11-25 NOTE — ED Notes (Signed)
Pt arrived via EMS with report of generalized weakness and episode of diarrhea x1 since yesterday. Pt denies GU symptoms, fever, and abd pain. Pt reported recently hospitalized for UTI.

## 2015-11-25 NOTE — Telephone Encounter (Signed)
Pt's daughter has called and states the pt is currently being seen in the ER. The daughter says she was found in her own feces today. She does not know how long she had been like that as well. Pt's neighbor found her in the bed. A few days ago she fell and hit her head. Pt's daughter says she is currently at [facility]. The social worker at the hospital is telling her that the [facility] form can not be filled out by their physicians because she is not admitted. The daughter would like to send her to a nursing home but needs the form signed. She does not know what to do and would like some help. Please call and advise [phone]

## 2015-11-25 NOTE — ED Notes (Addendum)
Once xray tech came PT info me she would need to try to go to bathroom before they take her since she will have to stand up while in xray. Xray tech info PT she will not be standing. Spoke to PT, info her if she make a BM while there we will clean her up once she return from Xray as well as get up and walk. PT taken to Xray

## 2015-11-25 NOTE — ED Provider Notes (Signed)
CSN: [id number]     Arrival date & time 11/25/15  1315 History   First MD Initiated Contact with Patient 11/25/15 1459     Chief Complaint  Patient presents with  . Weakness     (Consider location/radiation/quality/duration/timing/severity/associated sxs/prior Treatment) HPI  65 year old female with history of hypercholesterolemia, hypertension, multiple sclerosis presenting via EMS from home for evaluation of generalized weakness. Patient report for the past week she has increased generalized weakness. She is having difficulty performing her daily activities or getting out of her bed. Today while walking to the kitchen with a walker, she slipped and fell backward striking her bottom against the ground but denies hitting her head or loss of consciousness. She was having difficulty getting up and EMS was called. She does complain of pain to her low back, sharp, achy, 6 out of 10, nonradiating. She does not think she has any broken bone. No complaint of headache, neck pain, chest pain, difficulty breathing, productive cough, URI symptoms, abdominal pain, dysuria, focal numbness, nausea vomiting diarrhea. She has normal appetite. Her daughter also mentioned that she is living at home by herself and now unable to care for herself. She has been seen in the ED multiple times within the past month for altered mental status and weakness however they are unable to establish living facility for her to stay. Her daughter's primary requests to have patient placed in a nursing facility. Patient was seen in the ED 5 days ago for pain of her shoulder neck and back from a previous fall. There is no evidence of cauda equina time. It was felt that the pain is related to her MS. She was giving lidocaine patch, naproxen and Flexeril along with home health. Her daughter Still disease patient is not being cared appropriately at home and wanting patient to be placed in a nursing facility. Patient admits that she has history of  MS which affect her right side, that has not worsened.    Past Medical History  Diagnosis Date  . Elevated cholesterol   . Hypertension   . Arthritis   . Cervical dysplasia   . Depression   . Anxiety   . Macular degeneration of right eye 2001  . MS (multiple sclerosis) ([place]) 08/2013  . Acute encephalopathy 03/12/2015  . Anemia    Past Surgical History  Procedure Laterality Date  . Colposcopy    . Knee surgery Bilateral     "had cortisone injections in my knees"  . Cholecystectomy    . Tubal ligation    . Tonsillectomy    . Colonoscopy w/ polypectomy     Family History  Problem Relation Age of Onset  . Cancer Mother     Colon  . Diabetes Mother   . Hypertension Mother   . Arthritis Mother   . Cancer Father     prostate  . Kidney disease Brother     congenital single kidney  . Arthritis Sister   . Arthritis Sister   . Hematuria Son   . Gout Brother   . Multiple sclerosis Brother   . Arthritis Brother   . HIV Brother   . Cancer Brother     spinal   Social History  Substance Use Topics  . Smoking status: Former Smoker -- 1.00 packs/day for 45 years    Types: Cigarettes    Quit date: 10/23/2013  . Smokeless tobacco: Never Used     Comment: Quit smoking in 2014.  Marland Kitchen Alcohol Use: 0.0 oz/week  0 Standard drinks or equivalent per week     Comment: hasn't drank anything since jan 2015   OB History    Gravida Para Term Preterm AB TAB SAB Ectopic Multiple Living   6 3 3  3  3   2      Review of Systems  All other systems reviewed and are negative.     Allergies  Baclofen  Home Medications   Prior to Admission medications   Medication Sig Start Date End Date Taking? Authorizing Provider  Cholecalciferol (VITAMIN D3) 2000 UNITS capsule Take 2,000 Units by mouth daily.   Yes Historical Provider, MD  clonazePAM (KLONOPIN) 0.5 MG tablet Take 1 tablet (0.5 mg total) by mouth at bedtime as needed. For sleep 11/14/15  Yes Domenic Polite, MD  cyclobenzaprine  (FLEXERIL) 10 MG tablet Take 1 tablet (10 mg total) by mouth 2 (two) times daily as needed for muscle spasms. 11/19/15  Yes Gareth Morgan, MD  gatifloxacin (ZYMAXID) 0.5 % SOLN Place 1 drop into the left eye 4 (four) times daily. For one more week 10/08/15  Yes Thurnell Lose, MD  Investigational - Study Medication Take 40 mg by mouth daily after supper. Baclofen ER 40mg s take 1 capsule after evening meal daily at the same time daily   Yes Historical Provider, MD  lamoTRIgine (LAMICTAL) 100 MG tablet Take 200 mg by mouth at bedtime.   Yes Historical Provider, MD  lidocaine (LIDODERM) 5 % Place 1 patch onto the skin daily. Remove & Discard patch within 12 hours or as directed by MD 11/19/15  Yes Gareth Morgan, MD  modafinil (PROVIGIL) 200 MG tablet Take 1 tablet (200 mg total) by mouth daily. 10/01/15  Yes Marcial Pacas, MD  naproxen (NAPROSYN) 500 MG tablet Take 1 tablet (500 mg total) by mouth 2 (two) times daily with a meal. 11/19/15  Yes Gareth Morgan, MD  Omega-3 Fatty Acids (OMEGA 3 500 PO) Take 1 tablet by mouth daily.   Yes Historical Provider, MD  OVER THE COUNTER MEDICATION 1 capsule. Neuro optimizer   Yes Historical Provider, MD  prednisoLONE acetate (PRED FORTE) 1 % ophthalmic suspension Place 1 drop into the left eye 4 (four) times daily. For one more week 10/08/15  Yes Thurnell Lose, MD  traZODone (DESYREL) 50 MG tablet Take 1 tablet by mouth at bedtime. 10/24/15  Yes Historical Provider, MD  vitamin B-12 (CYANOCOBALAMIN) 1000 MCG tablet Take 1,000 mcg by mouth daily.   Yes Historical Provider, MD  HYDROcodone-acetaminophen (NORCO/VICODIN) 5-325 MG tablet Take 1 tablet by mouth every 4 (four) hours as needed for moderate pain. 11/14/15   Domenic Polite, MD  natalizumab (TYSABRI) 300 MG/15ML injection Inject 300 mg into the vein every 30 (thirty) days.     Historical Provider, MD  senna-docusate (SENOKOT-S) 8.6-50 MG tablet Take 1 tablet by mouth at bedtime as needed for mild constipation or  moderate constipation. 11/14/15   Domenic Polite, MD   BP 122/75 mmHg  Pulse 93  Temp(Src) 98.5 F (36.9 C) (Oral)  Resp 18  Ht 5' (1.524 m)  Wt 51.256 kg  BMI 22.07 kg/m2  SpO2 100% Physical Exam  Constitutional: She is oriented to person, place, and time. She appears well-developed and well-nourished. No distress.  African-American female in no acute discomfort, resting in bed.  HENT:  Head: Atraumatic.  Mouth/Throat: Oropharynx is clear and moist.  Eyes: Conjunctivae are normal.  Neck: Normal range of motion. Neck supple.  No cervical midline spine tenderness crepitus step-off.  Cardiovascular: Normal rate, regular rhythm and intact distal pulses.   Pulmonary/Chest: Effort normal. She has rales (faint crackles heard at base of lung).  Abdominal: Soft. Bowel sounds are normal. She exhibits no distension. There is no tenderness.  Musculoskeletal: She exhibits tenderness (Mild lumbar and paralumbar spinal muscle tenderness on palpation without crepitus or step-off. No overlying skin changes.).  Neurological: She is alert and oriented to person, place, and time. No cranial nerve deficit or sensory deficit. GCS eye subscore is 4. GCS verbal subscore is 5. GCS motor subscore is 6.  Global weakness to all 4 extremities with increased weakness to right lower extremity as compared to the left (chronic, related to her MS).   Skin: No rash noted.  Psychiatric: She has a normal mood and affect.  Nursing note and vitals reviewed.   ED Course  Procedures (including critical care time) Labs Review Labs Reviewed  BASIC METABOLIC PANEL - Abnormal; Notable for the following:    GFR calc non Af Amer 60 (*)    All other components within normal limits  CBC - Abnormal; Notable for the following:    WBC 10.7 (*)    Hemoglobin 11.2 (*)    HCT 35.4 (*)    All other components within normal limits  URINALYSIS, ROUTINE W REFLEX MICROSCOPIC (NOT AT [facility])  CBG MONITORING, ED    Imaging Review Dg  Chest 2 View  11/25/2015  CLINICAL DATA:  Chest pain with generalized weakness and diarrhea. EXAM: CHEST  2 VIEW COMPARISON:  11/19/2015 FINDINGS: The lungs are clear wiithout focal pneumonia, edema, pneumothorax or pleural effusion. Right parahilar nodularity related to anterior first rib and , is seen on multiple prior studies dating back to 03/12/2015 and 09/13/2009. The cardio pericardial silhouette is enlarged. The visualized bony structures of the thorax are intact. Bilateral cervical ribs are again noted. Telemetry leads overlie the chest. IMPRESSION: No active cardiopulmonary disease. Electronically Signed   By: Misty Stanley M.D.   On: 11/25/2015 17:12   Dg Lumbar Spine Complete  11/25/2015  CLINICAL DATA:  Fall, lumbar pain EXAM: LUMBAR SPINE - COMPLETE 4+ VIEW COMPARISON:  11/19/2015 FINDINGS: Five views of lumbar spine submitted. No acute fracture or subluxation. Mild anterior spurring noted upper endplate of L3 and L4 vertebral body. Moderate disc space flattening at L5-S1 level. Calcified fibroid within right pelvis measures 3.7 cm. IMPRESSION: No acute fracture or subluxation. Degenerative changes as described above. Calcified uterine fibroid within pelvis measures 3.7 cm. Electronically Signed   By: Lahoma Crocker M.D.   On: 11/25/2015 17:13   I have personally reviewed and evaluated these images and lab results as part of my medical decision-making.   EKG Interpretation None      MDM   Final diagnoses:  Generalized weakness  Fall from standing, initial encounter    BP 156/94 mmHg  Pulse 118  Temp(Src) 98.5 F (36.9 C) (Oral)  Resp 15  Ht 5' (1.524 m)  Wt 51.256 kg  BMI 22.07 kg/m2  SpO2 88%   3:28 PM Patient here with complaints of generalized weakness. He does have history of MS affecting the right side of body. She does not presents with any infectious symptoms. She is afebrile with stable normal vital signs. She is mentating appropriately. Daughter however felt patient  needs to be evaluated and will ultimately need help with placing patient in a nursing facility. She also brought an FL2 form to help facility placement.  Aside from right lower extremities weakness along with  Global weakness, no other obvious new focal weakness were noted. She is mentating appropriately, alert and oriented 4. At this time, I do not think patient has any symptoms that would qualify for admission however, the workup was initiated. IV fluid given.  5:55 PM Numerous attempts were made to obtain urine without success. However patient denies having any urinary discomfort or urinary symptoms. Chest x-ray without acute abnormalities, x-ray of her L-spine without acute fractures or dislocation. Her labs are otherwise reassuring. Patient able to ambulate using a walker without difficulty and without additional assistance.  6:20 PM I'm unable to identify any admitting diagnosis on today's exam.  At one point, her vital sign was showing pulse of 118 and O2 of 88%.  However, on recheck it was normal.  Pt denies any SOB.  Recommend pt to f/u with PCP to determine if she can be placed in a nursing facility or assistance living facility as needed.  Return precaution discussed.  Care discussed with Dr. Wilson Singer.   Domenic Moras, PA-C 11/25/15 [place], PA-C 11/25/15 Randol Kern  Virgel Manifold, MD 11/28/15 [phone]

## 2015-11-26 ENCOUNTER — Encounter: Payer: Medicare Other | Admitting: Neurology

## 2015-11-26 ENCOUNTER — Telehealth: Payer: Self-pay

## 2015-11-26 ENCOUNTER — Telehealth: Payer: Self-pay | Admitting: Neurology

## 2015-11-26 NOTE — Telephone Encounter (Signed)
I spoke with Wanda [name]. I asked the patient if she was going to come in for her appointment today.  Leighan said that she went to the hospital yesterday and that she would not be coming today.   I asked if she would like to speak with the coordinator.  She said yes.  I transferred her to Maida Sale.

## 2015-11-26 NOTE — Telephone Encounter (Signed)
I left a message for the patient to return my call.

## 2015-11-27 ENCOUNTER — Telehealth: Payer: Self-pay | Admitting: Neurology

## 2015-11-27 ENCOUNTER — Telehealth: Payer: Self-pay | Admitting: Physician Assistant

## 2015-11-27 DIAGNOSIS — H3530 Unspecified macular degeneration: Secondary | ICD-10-CM | POA: Diagnosis not present

## 2015-11-27 DIAGNOSIS — G35 Multiple sclerosis: Secondary | ICD-10-CM | POA: Diagnosis not present

## 2015-11-27 DIAGNOSIS — R296 Repeated falls: Secondary | ICD-10-CM | POA: Diagnosis not present

## 2015-11-27 DIAGNOSIS — I10 Essential (primary) hypertension: Secondary | ICD-10-CM | POA: Diagnosis not present

## 2015-11-27 NOTE — Telephone Encounter (Signed)
Please call this patient and/or her daughter.  I was expecting to see her and her daughter, [name], on Monday when she was released from the emergency department.  At that time, we were going to discuss what help she needed at home vs need to placement at an assisted living vs skilled nursing facility, due to frequent falls and episodes of mental status changes.  I held off completing the forms for the home assessment, because if she wasn't going to be at home, we wouldn't need that. However, I'm not sure where things stand at this point. What is the current status/plan?

## 2015-11-27 NOTE — Telephone Encounter (Signed)
Wanda [name] called.  She stated that she has been in the hospital and that she knows that Wanda [name] and Dr. Krista Blue have been trying to reach her.  She said that she will be in the clinic on February 26th for an infusion.  She said she would bring her study medication at that time as well as would be willing to answer any questions that Christian or Dr. Krista Blue have for her.  I tried to keep her on the phone to speak with Darrick Meigs, however, she stated she had to go and hung up the phone.

## 2015-11-29 ENCOUNTER — Telehealth: Payer: Self-pay | Admitting: Neurology

## 2015-11-29 NOTE — Telephone Encounter (Signed)
I left a message for Rayne Du.  I stated that earlier, Samiya Carns said that that she would be on site on 12/01/15 for an infusion.  Christian noticed that 12/01/15 was a Sunday and that she was not on the schedule for an infusion.  I double checked and noted that she was not scheduled for an infusion appointment.  I asked if she could call Christian or I back to make arrangements to return her study medication (for the research study).  I said that if she's in the hospital, I would be more than happy to make arrangements to pick up the study medication.  I left the research office number and stated that she could speak to [place] or I about the medication.

## 2015-11-30 ENCOUNTER — Emergency Department (HOSPITAL_COMMUNITY): Payer: Medicare Other

## 2015-11-30 ENCOUNTER — Inpatient Hospital Stay (HOSPITAL_COMMUNITY)
Admission: EM | Admit: 2015-11-30 | Discharge: 2015-12-05 | DRG: 092 | Disposition: A | Discharge status: Skilled Nursing Facility | Payer: Medicare Other | Attending: Internal Medicine | Admitting: Internal Medicine

## 2015-11-30 DIAGNOSIS — E785 Hyperlipidemia, unspecified: Secondary | ICD-10-CM | POA: Diagnosis present

## 2015-11-30 DIAGNOSIS — A047 Enterocolitis due to Clostridium difficile: Secondary | ICD-10-CM | POA: Diagnosis not present

## 2015-11-30 DIAGNOSIS — G9340 Encephalopathy, unspecified: Secondary | ICD-10-CM | POA: Diagnosis not present

## 2015-11-30 DIAGNOSIS — H3530 Unspecified macular degeneration: Secondary | ICD-10-CM | POA: Diagnosis present

## 2015-11-30 DIAGNOSIS — F419 Anxiety disorder, unspecified: Secondary | ICD-10-CM | POA: Diagnosis present

## 2015-11-30 DIAGNOSIS — A0472 Enterocolitis due to Clostridium difficile, not specified as recurrent: Secondary | ICD-10-CM | POA: Insufficient documentation

## 2015-11-30 DIAGNOSIS — Z79899 Other long term (current) drug therapy: Secondary | ICD-10-CM | POA: Insufficient documentation

## 2015-11-30 DIAGNOSIS — E7800 Pure hypercholesterolemia, unspecified: Secondary | ICD-10-CM | POA: Diagnosis present

## 2015-11-30 DIAGNOSIS — R6889 Other general symptoms and signs: Secondary | ICD-10-CM | POA: Diagnosis not present

## 2015-11-30 DIAGNOSIS — E876 Hypokalemia: Secondary | ICD-10-CM | POA: Diagnosis present

## 2015-11-30 DIAGNOSIS — F329 Major depressive disorder, single episode, unspecified: Secondary | ICD-10-CM | POA: Diagnosis present

## 2015-11-30 DIAGNOSIS — G35 Multiple sclerosis: Secondary | ICD-10-CM | POA: Diagnosis not present

## 2015-11-30 DIAGNOSIS — I10 Essential (primary) hypertension: Secondary | ICD-10-CM | POA: Diagnosis present

## 2015-11-30 DIAGNOSIS — N390 Urinary tract infection, site not specified: Secondary | ICD-10-CM | POA: Diagnosis present

## 2015-11-30 DIAGNOSIS — N179 Acute kidney failure, unspecified: Secondary | ICD-10-CM

## 2015-11-30 DIAGNOSIS — K5900 Constipation, unspecified: Secondary | ICD-10-CM | POA: Diagnosis present

## 2015-11-30 DIAGNOSIS — R4182 Altered mental status, unspecified: Secondary | ICD-10-CM

## 2015-11-30 DIAGNOSIS — G92 Toxic encephalopathy: Secondary | ICD-10-CM | POA: Diagnosis not present

## 2015-11-30 DIAGNOSIS — B9620 Unspecified Escherichia coli [E. coli] as the cause of diseases classified elsewhere: Secondary | ICD-10-CM | POA: Insufficient documentation

## 2015-11-30 DIAGNOSIS — E860 Dehydration: Secondary | ICD-10-CM | POA: Insufficient documentation

## 2015-11-30 DIAGNOSIS — R159 Full incontinence of feces: Secondary | ICD-10-CM | POA: Diagnosis present

## 2015-11-30 DIAGNOSIS — R32 Unspecified urinary incontinence: Secondary | ICD-10-CM | POA: Diagnosis present

## 2015-11-30 LAB — COMPREHENSIVE METABOLIC PANEL
ALBUMIN: 3.4 g/dL — AB (ref 3.5–5.0)
ALK PHOS: 63 U/L (ref 38–126)
ALT: 12 U/L — ABNORMAL LOW (ref 14–54)
ANION GAP: 16 — AB (ref 5–15)
AST: 15 U/L (ref 15–41)
BUN: 12 mg/dL (ref 6–20)
CALCIUM: 9.3 mg/dL (ref 8.9–10.3)
CHLORIDE: 113 mmol/L — AB (ref 101–111)
CO2: 18 mmol/L — AB (ref 22–32)
Creatinine, Ser: 1.03 mg/dL — ABNORMAL HIGH (ref 0.44–1.00)
GFR calc non Af Amer: 56 mL/min — ABNORMAL LOW (ref >60)
GLUCOSE: 97 mg/dL (ref 65–99)
Potassium: 4 mmol/L (ref 3.5–5.1)
SODIUM: 147 mmol/L — AB (ref 135–145)
Total Bilirubin: 0.7 mg/dL (ref 0.3–1.2)
Total Protein: 6.4 g/dL — ABNORMAL LOW (ref 6.5–8.1)

## 2015-11-30 LAB — CBG MONITORING, ED: GLUCOSE-CAPILLARY: 86 mg/dL (ref 65–99)

## 2015-11-30 LAB — URINALYSIS, ROUTINE W REFLEX MICROSCOPIC
Bilirubin Urine: NEGATIVE
Glucose, UA: NEGATIVE mg/dL
Hgb urine dipstick: NEGATIVE
Ketones, ur: NEGATIVE mg/dL
NITRITE: NEGATIVE
PH: 7 (ref 5.0–8.0)
Protein, ur: NEGATIVE mg/dL
SPECIFIC GRAVITY, URINE: 1.011 (ref 1.005–1.030)

## 2015-11-30 LAB — ETHANOL: Alcohol, Ethyl (B): <5 mg/dL (ref <5)

## 2015-11-30 LAB — RAPID URINE DRUG SCREEN, HOSP PERFORMED
AMPHETAMINES: NOT DETECTED
BARBITURATES: NOT DETECTED
BENZODIAZEPINES: NOT DETECTED
COCAINE: NOT DETECTED
OPIATES: NOT DETECTED
TETRAHYDROCANNABINOL: NOT DETECTED

## 2015-11-30 LAB — URINE MICROSCOPIC-ADD ON

## 2015-11-30 LAB — CBC
HEMATOCRIT: 37.4 % (ref 36.0–46.0)
HEMOGLOBIN: 11.8 g/dL — AB (ref 12.0–15.0)
MCH: 26.8 pg (ref 26.0–34.0)
MCHC: 31.6 g/dL (ref 30.0–36.0)
MCV: 84.8 fL (ref 78.0–100.0)
Platelets: 246 10*3/uL (ref 150–400)
RBC: 4.41 MIL/uL (ref 3.87–5.11)
RDW: 15.1 % (ref 11.5–15.5)
WBC: 6.2 10*3/uL (ref 4.0–10.5)

## 2015-11-30 LAB — I-STAT CG4 LACTIC ACID, ED: Lactic Acid, Venous: 1.89 mmol/L (ref 0.5–2.0)

## 2015-11-30 MED ORDER — ACETAMINOPHEN 325 MG PO TABS
650.0000 mg | ORAL_TABLET | Freq: Four times a day (QID) | ORAL | Status: DC | PRN
Start: 2015-11-30 — End: 2015-12-05

## 2015-11-30 MED ORDER — ALUM & MAG HYDROXIDE-SIMETH 200-200-20 MG/5ML PO SUSP: 30.0000 mL | Freq: Four times a day (QID) | ORAL | Status: DC | PRN

## 2015-11-30 MED ORDER — OXYCODONE HCL 5 MG PO TABS
5.0000 mg | ORAL_TABLET | ORAL | Status: DC | PRN
  Administered 2015-12-01: 5 mg via ORAL
  Filled 2015-11-30: qty 1

## 2015-11-30 MED ORDER — ONDANSETRON HCL 4 MG/2ML IJ SOLN: 4.0000 mg | Freq: Four times a day (QID) | INTRAMUSCULAR | Status: DC | PRN

## 2015-11-30 MED ORDER — NATALIZUMAB 300 MG/15ML IV CONC: 300.0000 mg | INTRAVENOUS | Status: DC

## 2015-11-30 MED ORDER — DEXTROSE-NACL 5-0.45 % IV SOLN
INTRAVENOUS | Status: DC
  Administered 2015-12-01 – 2015-12-03 (×4): via INTRAVENOUS

## 2015-11-30 MED ORDER — SODIUM CHLORIDE 0.9 % IV BOLUS (SEPSIS)
1000.0000 mL | Freq: Once | INTRAVENOUS | Status: AC
  Administered 2015-11-30: 1000 mL via INTRAVENOUS

## 2015-11-30 MED ORDER — ONDANSETRON HCL 4 MG PO TABS: 4.0000 mg | ORAL_TABLET | Freq: Four times a day (QID) | ORAL | Status: DC | PRN

## 2015-11-30 MED ORDER — DEXTROSE 5 % IV SOLN
1.0000 g | Freq: Once | INTRAVENOUS | Status: AC
  Administered 2015-11-30: 1 g via INTRAVENOUS
  Filled 2015-11-30: qty 10

## 2015-11-30 MED ORDER — HYDRALAZINE HCL 20 MG/ML IJ SOLN
10.0000 mg | Freq: Four times a day (QID) | INTRAMUSCULAR | Status: DC | PRN
  Administered 2015-11-30 – 2015-12-02 (×2): 10 mg via INTRAVENOUS
  Filled 2015-11-30 (×3): qty 1

## 2015-11-30 MED ORDER — ACETAMINOPHEN 650 MG RE SUPP: 650.0000 mg | Freq: Four times a day (QID) | RECTAL | Status: DC | PRN

## 2015-11-30 MED ORDER — MODAFINIL 100 MG PO TABS
200.0000 mg | ORAL_TABLET | Freq: Every day | ORAL | Status: DC
  Administered 2015-12-01 – 2015-12-05 (×4): 200 mg via ORAL
  Filled 2015-11-30 (×5): qty 2

## 2015-11-30 MED ORDER — HYDROMORPHONE HCL 1 MG/ML IJ SOLN
0.5000 mg | INTRAMUSCULAR | Status: DC | PRN
  Administered 2015-12-01: 1 mg via INTRAVENOUS
  Filled 2015-11-30: qty 1

## 2015-11-30 MED ORDER — SODIUM CHLORIDE 0.9 % IV SOLN
INTRAVENOUS | Status: AC
  Administered 2015-11-30: via INTRAVENOUS

## 2015-11-30 MED ORDER — SENNOSIDES-DOCUSATE SODIUM 8.6-50 MG PO TABS: 1.0000 | ORAL_TABLET | Freq: Every evening | ORAL | Status: DC | PRN

## 2015-11-30 MED ORDER — ENOXAPARIN SODIUM 30 MG/0.3ML ~~LOC~~ SOLN
30.0000 mg | SUBCUTANEOUS | Status: DC
  Administered 2015-12-01: 30 mg via SUBCUTANEOUS
  Filled 2015-11-30: qty 0.3

## 2015-11-30 NOTE — Consult Note (Signed)
Admission H&P    Chief Complaint: Altered mental status.  HPI: Wanda [name] is an 65 y.o. female history multiple sclerosis, recurrent episodes of altered mental status, hypertension, hyperlipidemia, depression, anxiety and macular degeneration, brought to the emergency room because of complaints of altered mental status as well as vomiting and incontinence of urine and stool. She's had recent presentation similar nature within the past few weeks. Mental status changes with thought to be in part due to medications, particularly with her taking baclofen inappropriately. Patient has been treated with Tysabri and has developed JC disease +. Memory difficulty and confusion have progressively worsened over the past few months. Patient has difficulty recognizing close family members, even on good days. Her daughter thinks that she also embellishes symptoms from time to time. She's had urinary tract infections in the past when she presented with similar complaints. She has not developed a clear focal acute neurologic deficit. Laboratory studies were unremarkable except for slightly elevated sodium and chloride. WBC count was normal. Urinalysis is pending. Urine drug screen is also pending. CT scan of her head showed no acute intracranial abnormality.  Past Medical History  Diagnosis Date  . Elevated cholesterol   . Hypertension   . Arthritis   . Cervical dysplasia   . Depression   . Anxiety   . Macular degeneration of right eye 2001  . MS (multiple sclerosis) ([place]) 08/2013  . Acute encephalopathy 03/12/2015  . Anemia     Past Surgical History  Procedure Laterality Date  . Colposcopy    . Knee surgery Bilateral     "had cortisone injections in my knees"  . Cholecystectomy    . Tubal ligation    . Tonsillectomy    . Colonoscopy w/ polypectomy      Family History  Problem Relation Age of Onset  . Cancer Mother     Colon  . Diabetes Mother   . Hypertension Mother   . Arthritis Mother   .  Cancer Father     prostate  . Kidney disease Brother     congenital single kidney  . Arthritis Sister   . Arthritis Sister   . Hematuria Son   . Gout Brother   . Multiple sclerosis Brother   . Arthritis Brother   . HIV Brother   . Cancer Brother     spinal   Social History:  reports that she quit smoking about 2 years ago. Her smoking use included Cigarettes. She has a 45 pack-year smoking history. She has never used smokeless tobacco. She reports that she drinks alcohol. She reports that she does not use illicit drugs.  Allergies:  Allergies  Allergen Reactions  . Baclofen     Confusion, muscle weakness, lethargy    Medications: Patient's preadmission medications were reviewed by me.  ROS: History obtained from patient's daughter.  General ROS: negative for - chills, fatigue, fever, night sweats, weight gain or weight loss Psychological ROS: As noted in present illness Ophthalmic ROS: negative for - blurry vision, double vision, eye pain or loss of vision ENT ROS: negative for - epistaxis, nasal discharge, oral lesions, sore throat, tinnitus or vertigo Allergy and Immunology ROS: negative for - hives or itchy/watery eyes Hematological and Lymphatic ROS: negative for - bleeding problems, bruising or swollen lymph nodes Endocrine ROS: negative for - galactorrhea, hair pattern changes, polydipsia/polyuria or temperature intolerance Respiratory ROS: negative for - cough, hemoptysis, shortness of breath or wheezing Cardiovascular ROS: negative for - chest pain, dyspnea on exertion,  edema or irregular heartbeat Gastrointestinal ROS: As noted in present illness Genito-Urinary ROS: negative for - dysuria, hematuria, incontinence or urinary frequency/urgency Musculoskeletal ROS: negative for - joint swelling or muscular weakness Neurological ROS: as noted in HPI Dermatological ROS: negative for rash and skin lesion changes  Physical Examination: Blood pressure 173/99, pulse 71,  temperature 97.8 F (36.6 C), temperature source Rectal, resp. rate 17, SpO2 99 %.  HEENT-  Normocephalic, no lesions, without obvious abnormality.  Normal external eye and conjunctiva.  Normal TM's bilaterally.  Normal auditory canals and external ears. Normal external nose, mucus membranes and septum.  Normal pharynx. Neck supple with no masses, nodes, nodules or enlargement. Cardiovascular - regular rate and rhythm, S1, S2 normal, no murmur, click, rub or gallop Lungs - chest clear, no wheezing, rales, normal symmetric air entry Abdomen - mild tenderness without rebound, normal bowel sounds Extremities - no joint deformities, effusion, or inflammation and no edema  Neurologic Examination: Mental Status: Level of alertness fluctuated. Patient remained alert when engaged  there was no speech output. Patient moderate in response to questions occasionally. She also followed simple commands. Cranial Nerves: II-Visual fields were normal. III/IV/VI- Right pupil was normal in size and reacted normally to light. Pupil could not be seen because of corneal opacity. Extraocular movements were full and conjugate with right left lateral gaze.    VII-. Mild left lower facial weakness VIII-normal. X-no speech output. Motor: Strength of extremities difficult to assess, as patient was only minimally cooperative with examination. She had altered movement of extremities distally. Otherwise patient did not attempt to participated in examination. Muscle tone was flaccid throughout. Sensory: Reacted to noxious stimuli electrically. Deep Tendon Reflexes: Race to 1+ and symmetric. Plantars: Mute bilaterally Cerebellar: Unable to assess. Carotid auscultation: Normal  Results for orders placed or performed during the hospital encounter of 11/30/15 (from the past 48 hour(s))  Comprehensive metabolic panel     Status: Abnormal   Collection Time: 11/30/15  6:23 PM  Result Value Ref Range   Sodium 147 (H) 135 -  145 mmol/L   Potassium 4.0 3.5 - 5.1 mmol/L   Chloride 113 (H) 101 - 111 mmol/L   CO2 18 (L) 22 - 32 mmol/L   Glucose, Bld 97 65 - 99 mg/dL   BUN 12 6 - 20 mg/dL   Creatinine, Ser 1.03 (H) 0.44 - 1.00 mg/dL   Calcium 9.3 8.9 - 10.3 mg/dL   Total Protein 6.4 (L) 6.5 - 8.1 g/dL   Albumin 3.4 (L) 3.5 - 5.0 g/dL   AST 15 15 - 41 U/L   ALT 12 (L) 14 - 54 U/L   Alkaline Phosphatase 63 38 - 126 U/L   Total Bilirubin 0.7 0.3 - 1.2 mg/dL   GFR calc non Af Amer 56 (L) >60 mL/min   GFR calc Af Amer >60 >60 mL/min    Comment: (NOTE) The eGFR has been calculated using the CKD EPI equation. This calculation has not been validated in all clinical situations. eGFR's persistently <60 mL/min signify possible Chronic Kidney Disease.    Anion gap 16 (H) 5 - 15  CBC     Status: Abnormal   Collection Time: 11/30/15  6:23 PM  Result Value Ref Range   WBC 6.2 4.0 - 10.5 K/uL   RBC 4.41 3.87 - 5.11 MIL/uL   Hemoglobin 11.8 (L) 12.0 - 15.0 g/dL   HCT 37.4 36.0 - 46.0 %   MCV 84.8 78.0 - 100.0 fL   MCH 26.8  26.0 - 34.0 pg   MCHC 31.6 30.0 - 36.0 g/dL   RDW 15.1 11.5 - 15.5 %   Platelets 246 150 - 400 K/uL  Ethanol     Status: None   Collection Time: 11/30/15  6:23 PM  Result Value Ref Range   Alcohol, Ethyl (B) <5 <5 mg/dL    Comment:        LOWEST DETECTABLE LIMIT FOR SERUM ALCOHOL IS 5 mg/dL FOR MEDICAL PURPOSES ONLY   I-Stat CG4 Lactic Acid, ED     Status: None   Collection Time: 11/30/15  6:31 PM  Result Value Ref Range   Lactic Acid, Venous 1.89 0.5 - 2.0 mmol/L  CBG monitoring, ED     Status: None   Collection Time: 11/30/15  7:36 PM  Result Value Ref Range   Glucose-Capillary 86 65 - 99 mg/dL   Comment 1 Notify RN    Comment 2 Document in Chart    Ct Head Wo Contrast  11/30/2015  CLINICAL DATA:  Altered mental status.  Confusion. EXAM: CT HEAD WITHOUT CONTRAST TECHNIQUE: Contiguous axial images were obtained from the base of the skull through the vertex without intravenous  contrast. COMPARISON:  Head CT 11/10/2015, brain MRI 11/11/2015 FINDINGS: No intracranial hemorrhage, mass effect, or midline shift. Generalized atrophy and periventricular white matter change, stable from prior exam. No hydrocephalus. The basilar cisterns are patent. No evidence of territorial infarct. No intracranial fluid collection. Calvarium is intact. Included paranasal sinuses and mastoid air cells are well aerated. IMPRESSION: Stable atrophy and periventricular white matter change. No acute intracranial abnormality. Electronically Signed   By: Jeb Levering M.D.   On: 11/30/2015 19:49   Dg Chest Port 1 View  11/30/2015  CLINICAL DATA:  Altered mental status EXAM: PORTABLE CHEST 1 VIEW COMPARISON:  11/25/2015 FINDINGS: Cardiac shadow is stable. The lungs are well aerated bilaterally. No focal infiltrate or sizable effusion is seen. No bony abnormality is noted. IMPRESSION: No active disease. Electronically Signed   By: Inez Catalina M.D.   On: 11/30/2015 18:43    Assessment/Plan 65 year old lady with multiple sclerosis as well as recurrent mental status changes, which are likely multifactorial, including misuse of medications, as well as possible recurrent urinary tract infection and probable progressive dementia. Psychophysiologic factors may be contributing to her complaints as well.  Recommendations: 1. May administer Tysabri, as patient is slightly overdue for next injection 2. Hold baclofen, as patient is not exhibiting significant spasticity 3. EEG to assess severity of encephalopathy, as well as to rule out focal seizure activity. 4. Physical therapy, occupational therapy and speech therapy consult 5. Consider placement, again, in a skilled nursing facility  We will continue to follow this patient with you.  C.R. Nicole Kindred, [place] [facility] [phone]  11/30/2015, 8:01 PM

## 2015-11-30 NOTE — ED Notes (Signed)
Pt. Coming from home via [facility] with [facility]. EMS reports pt. Is a part of a neuro research study involving pt. Taking over 90mg  of baclofen daily to tx her relapsing/remitting MS. Since pt. Started this trial she has been hospitalized weekly per EMS for [facility]. Pt. Family reports to EMS that pt. Is defecating and vomiting on herself at home and very aggressive at times. Pt. Becomes angry if MS trial or baclofen is mentioned-per EMS. Pt. Family also expressed concerns to EMS that pt. May be self medicating over the 90mg  baclofen prescribed. Pt. Vitals stable en route at 172/90 mmHg, 70 bpm regular heart rate, 107 mg/dL CBG, 18 RR, and 96% on room air. Pt. Arrives to ED asleep. Pt. Also due for her tisabria (sp?) infusion this week per her family.

## 2015-11-30 NOTE — ED Provider Notes (Signed)
CSN: [id number]     Arrival date & time 11/30/15  1736 History   First MD Initiated Contact with Patient 11/30/15 1737     No chief complaint on file.    (Consider location/radiation/quality/duration/timing/severity/associated sxs/prior Treatment) HPI   Pt with hx relapsing remitting MS, HTN, HLD, with multiple ED visits recently for falls, AMS, with daughter [facility]) requesting placement and additional help, pt also reported to be on baclofen trial for MS.  Pt brought in today for altered LOC, frequent vomiting and diarrhea.   Has had recent admission for transient altered mental status, thought to be from polypharmacy and high dose baclofen vs seizure.   6:38 PM  Family is now bedside.  They are very concerned about the patient - state she has had intermittent episodes of altered level of consciousness, repeats a single phrase over and over, defecates and urinates on herself.  This apparently lasts for several hours and then resolves.  She has been admitted for this before.  Daughter reports patient was a little confused this morning (hand out of town visitors) but otherwise at baseline - everyone left the house on an errand and when they returned the patient was completely altered, not communicative, had urinated and defecated on herself.     Pt unable to comment or add to the history.     Level V caveat for altered mental status.    Past Medical History  Diagnosis Date  . Elevated cholesterol   . Hypertension   . Arthritis   . Cervical dysplasia   . Depression   . Anxiety   . Macular degeneration of right eye 2001  . MS (multiple sclerosis) ([place]) 08/2013  . Acute encephalopathy 03/12/2015  . Anemia    Past Surgical History  Procedure Laterality Date  . Colposcopy    . Knee surgery Bilateral     "had cortisone injections in my knees"  . Cholecystectomy    . Tubal ligation    . Tonsillectomy    . Colonoscopy w/ polypectomy     Family History  Problem Relation Age of Onset   . Cancer Mother     Colon  . Diabetes Mother   . Hypertension Mother   . Arthritis Mother   . Cancer Father     prostate  . Kidney disease Brother     congenital single kidney  . Arthritis Sister   . Arthritis Sister   . Hematuria Son   . Gout Brother   . Multiple sclerosis Brother   . Arthritis Brother   . HIV Brother   . Cancer Brother     spinal   Social History  Substance Use Topics  . Smoking status: Former Smoker -- 1.00 packs/day for 45 years    Types: Cigarettes    Quit date: 10/23/2013  . Smokeless tobacco: Never Used     Comment: Quit smoking in 2014.  Marland Kitchen Alcohol Use: 0.0 oz/week    0 Standard drinks or equivalent per week     Comment: hasn't drank anything since jan 2015   OB History    Gravida Para Term Preterm AB TAB SAB Ectopic Multiple Living   6 3 3  3  3   2      Review of Systems  Unable to perform ROS: Mental status change      Allergies  Baclofen  Home Medications   Prior to Admission medications   Medication Sig Start Date End Date Taking? Authorizing Provider  Cholecalciferol (VITAMIN D3) 2000  UNITS capsule Take 2,000 Units by mouth daily.    Historical Provider, MD  clonazePAM (KLONOPIN) 0.5 MG tablet Take 1 tablet (0.5 mg total) by mouth at bedtime as needed. For sleep 11/14/15   Domenic Polite, MD  cyclobenzaprine (FLEXERIL) 10 MG tablet Take 1 tablet (10 mg total) by mouth 2 (two) times daily as needed for muscle spasms. 11/19/15   Gareth Morgan, MD  gatifloxacin (ZYMAXID) 0.5 % SOLN Place 1 drop into the left eye 4 (four) times daily. For one more week 10/08/15   Thurnell Lose, MD  HYDROcodone-acetaminophen (NORCO/VICODIN) 5-325 MG tablet Take 1 tablet by mouth every 4 (four) hours as needed for moderate pain. 11/14/15   Domenic Polite, MD  Investigational - Study Medication Take 40 mg by mouth daily after supper. Baclofen ER 40mg s take 1 capsule after evening meal daily at the same time daily    Historical Provider, MD  lamoTRIgine  (LAMICTAL) 100 MG tablet Take 200 mg by mouth at bedtime.    Historical Provider, MD  lidocaine (LIDODERM) 5 % Place 1 patch onto the skin daily. Remove & Discard patch within 12 hours or as directed by MD 11/19/15   Gareth Morgan, MD  modafinil (PROVIGIL) 200 MG tablet Take 1 tablet (200 mg total) by mouth daily. 10/01/15   Marcial Pacas, MD  naproxen (NAPROSYN) 500 MG tablet Take 1 tablet (500 mg total) by mouth 2 (two) times daily with a meal. 11/19/15   Gareth Morgan, MD  natalizumab (TYSABRI) 300 MG/15ML injection Inject 300 mg into the vein every 30 (thirty) days.     Historical Provider, MD  Omega-3 Fatty Acids (OMEGA 3 500 PO) Take 1 tablet by mouth daily.    Historical Provider, MD  OVER THE COUNTER MEDICATION Take 1 capsule by mouth daily. Neuro optimizer    Historical Provider, MD  prednisoLONE acetate (PRED FORTE) 1 % ophthalmic suspension Place 1 drop into the left eye 4 (four) times daily. For one more week 10/08/15   Thurnell Lose, MD  senna-docusate (SENOKOT-S) 8.6-50 MG tablet Take 1 tablet by mouth at bedtime as needed for mild constipation or moderate constipation. 11/14/15   Domenic Polite, MD  traZODone (DESYREL) 50 MG tablet Take 50 mg by mouth at bedtime.  10/24/15   Historical Provider, MD  vitamin B-12 (CYANOCOBALAMIN) 1000 MCG tablet Take 1,000 mcg by mouth daily.    Historical Provider, MD  zolpidem (AMBIEN) 5 MG tablet Take 5 mg by mouth at bedtime as needed for sleep.    Historical Provider, MD   There were no vitals taken for this visit. Physical Exam  Constitutional: She appears well-developed and well-nourished. No distress.  Pt does not communicate or follow commands, eyes are open - moves eyes quickly from side to side, grunts occasionally.    HENT:  Head: Normocephalic and atraumatic.  Neck: Neck supple.  Cardiovascular: Normal rate and regular rhythm.   Pulmonary/Chest: Effort normal and breath sounds normal. No respiratory distress. She has no wheezes. She has no  rales.  Abdominal: Soft. She exhibits no distension. There is tenderness. There is no rebound and no guarding.  Tenderness across lower abdomen  Neurological: She is alert.  Skin: She is not diaphoretic.  Nursing note and vitals reviewed.   ED Course  Procedures (including critical care time) Labs Review Labs Reviewed  COMPREHENSIVE METABOLIC PANEL - Abnormal; Notable for the following:    Sodium 147 (*)    Chloride 113 (*)    CO2 18 (*)  Creatinine, Ser 1.03 (*)    Total Protein 6.4 (*)    Albumin 3.4 (*)    ALT 12 (*)    GFR calc non Af Amer 56 (*)    Anion gap 16 (*)    All other components within normal limits  CBC - Abnormal; Notable for the following:    Hemoglobin 11.8 (*)    All other components within normal limits  URINALYSIS, ROUTINE W REFLEX MICROSCOPIC (NOT AT [facility]) - Abnormal; Notable for the following:    APPearance CLOUDY (*)    Leukocytes, UA MODERATE (*)    All other components within normal limits  URINE MICROSCOPIC-ADD ON - Abnormal; Notable for the following:    Squamous Epithelial / LPF 0-5 (*)    Bacteria, UA MANY (*)    All other components within normal limits  URINE CULTURE  URINE RAPID DRUG SCREEN, HOSP PERFORMED  ETHANOL  CBG MONITORING, ED  I-STAT CG4 LACTIC ACID, ED    Imaging Review Dg Chest Port 1 View  11/30/2015  CLINICAL DATA:  Altered mental status EXAM: PORTABLE CHEST 1 VIEW COMPARISON:  11/25/2015 FINDINGS: Cardiac shadow is stable. The lungs are well aerated bilaterally. No focal infiltrate or sizable effusion is seen. No bony abnormality is noted. IMPRESSION: No active disease. Electronically Signed   By: Inez Catalina M.D.   On: 11/30/2015 18:43   I have personally reviewed and evaluated these images and lab results as part of my medical decision-making.   EKG Interpretation   Date/Time:  Saturday November 30 2015 19:36:34 EST Ventricular Rate:  70 PR Interval:  144 QRS Duration: 93 QT Interval:  395 QTC Calculation:  426 R Axis:   50 Text Interpretation:  Sinus rhythm Probable left atrial enlargement No  significant change since last tracing Confirmed by FLOYD MD, Quillian Quince  [id number]) on 11/30/2015 8:32:50 PM       6:10 PM Discussed pt with Dr Tyrone Nine who will also see patient.    6:50 PM I spoke with Dr Cheral Marker who recommends head CT without contrast, he will see pt in consult.   Neurology recommending overnight observation.     8:33 PM I spoke with Dr Arnoldo Morale who accepts patient for admission.    UA appears infected, will give rocephin.    MDM   Final diagnoses:  Altered mental status, unspecified altered mental status type  UTI (lower urinary tract infection)    Patient with several recent ED visits for altered mental status and falls.  Found to be altered this afternoon, brought to ED.  Likely polypharmacy vs seizure, infection, MS flare.  Labs remarkable for mild dehydration.  UA appears infected.  Admitted to [facility] for overnight observation, neurology to consult.     Clayton Bibles, PA-C 11/30/15 2038  Deno Etienne, DO 11/30/15 2042

## 2015-11-30 NOTE — ED Notes (Signed)
Attempted IV x2, unsuccessful.  

## 2015-11-30 NOTE — Progress Notes (Signed)
Attempted to get report from emergency room nurse.  She will call me back.

## 2015-11-30 NOTE — ED Notes (Signed)
Pt. Daughter reports that pt. PCP wants her to have a MRI. Will make pt. RN aware.

## 2015-11-30 NOTE — H&P (Signed)
[facility] Admission History and Physical       JANETT HAMEISTER [id number] DOB: 02-23-1951 DOA: 11/30/2015  Referring physician:  EDP PCP: JEFFERY,[name], PA-C  Specialists:   Chief Complaint: Confusion  HPI: Wanda [name] is a 65 y.o. female with a history of Multiple Sclerosis and HTN who was brought to the ED due to increased confusion over the day.   She was hospitalized on 11/11/2015 for similar reasons.  She was placed on IV Rocephin empirically for a UTI.  A CT scan of the head was performed and was negative for acute findings and Neuro was consulted and she was seen.       Review of Systems: Unable to Obtain from the Patient  Past Medical History  Diagnosis Date  . Elevated cholesterol   . Hypertension   . Arthritis   . Cervical dysplasia   . Depression   . Anxiety   . Macular degeneration of right eye 2001  . MS (multiple sclerosis) ([place]) 08/2013  . Acute encephalopathy 03/12/2015  . Anemia      Past Surgical History  Procedure Laterality Date  . Colposcopy    . Knee surgery Bilateral     "had cortisone injections in my knees"  . Cholecystectomy    . Tubal ligation    . Tonsillectomy    . Colonoscopy w/ polypectomy        Prior to Admission medications   Medication Sig Start Date End Date Taking? Authorizing Provider  Cholecalciferol (VITAMIN D3) 2000 UNITS capsule Take 2,000 Units by mouth daily.    Historical Provider, MD  clonazePAM (KLONOPIN) 0.5 MG tablet Take 1 tablet (0.5 mg total) by mouth at bedtime as needed. For sleep 11/14/15   Domenic Polite, MD  cyclobenzaprine (FLEXERIL) 10 MG tablet Take 1 tablet (10 mg total) by mouth 2 (two) times daily as needed for muscle spasms. 11/19/15   Gareth Morgan, MD  gatifloxacin (ZYMAXID) 0.5 % SOLN Place 1 drop into the left eye 4 (four) times daily. For one more week 10/08/15   Thurnell Lose, MD  HYDROcodone-acetaminophen (NORCO/VICODIN) 5-325 MG tablet Take 1 tablet by mouth every 4 (four) hours  as needed for moderate pain. 11/14/15   Domenic Polite, MD  Investigational - Study Medication Take 40 mg by mouth daily after supper. Baclofen ER 40mg s take 1 capsule after evening meal daily at the same time daily    Historical Provider, MD  lamoTRIgine (LAMICTAL) 100 MG tablet Take 200 mg by mouth at bedtime.    Historical Provider, MD  lidocaine (LIDODERM) 5 % Place 1 patch onto the skin daily. Remove & Discard patch within 12 hours or as directed by MD 11/19/15   Gareth Morgan, MD  modafinil (PROVIGIL) 200 MG tablet Take 1 tablet (200 mg total) by mouth daily. 10/01/15   Marcial Pacas, MD  naproxen (NAPROSYN) 500 MG tablet Take 1 tablet (500 mg total) by mouth 2 (two) times daily with a meal. 11/19/15   Gareth Morgan, MD  natalizumab (TYSABRI) 300 MG/15ML injection Inject 300 mg into the vein every 30 (thirty) days.     Historical Provider, MD  Omega-3 Fatty Acids (OMEGA 3 500 PO) Take 1 tablet by mouth daily.    Historical Provider, MD  OVER THE COUNTER MEDICATION Take 1 capsule by mouth daily. Neuro optimizer    Historical Provider, MD  prednisoLONE acetate (PRED FORTE) 1 % ophthalmic suspension Place 1 drop into the left eye 4 (four) times daily. For one  more week 10/08/15   Thurnell Lose, MD  senna-docusate (SENOKOT-S) 8.6-50 MG tablet Take 1 tablet by mouth at bedtime as needed for mild constipation or moderate constipation. 11/14/15   Domenic Polite, MD  traZODone (DESYREL) 50 MG tablet Take 50 mg by mouth at bedtime.  10/24/15   Historical Provider, MD  vitamin B-12 (CYANOCOBALAMIN) 1000 MCG tablet Take 1,000 mcg by mouth daily.    Historical Provider, MD  zolpidem (AMBIEN) 5 MG tablet Take 5 mg by mouth at bedtime as needed for sleep.    Historical Provider, MD     Allergies  Allergen Reactions  . Baclofen     Confusion, muscle weakness, lethargy    Social History:  reports that she quit smoking about 2 years ago. Her smoking use included Cigarettes. She has a 45 pack-year smoking  history. She has never used smokeless tobacco. She reports that she drinks alcohol. She reports that she does not use illicit drugs.    Family History  Problem Relation Age of Onset  . Cancer Mother     Colon  . Diabetes Mother   . Hypertension Mother   . Arthritis Mother   . Cancer Father     prostate  . Kidney disease Brother     congenital single kidney  . Arthritis Sister   . Arthritis Sister   . Hematuria Son   . Gout Brother   . Multiple sclerosis Brother   . Arthritis Brother   . HIV Brother   . Cancer Brother     spinal       Physical Exam:  GEN:  Pleasant Well Nourished and Well DEVeloped 65 y.o. female examined and in no acute distress; cooperative with exam Filed Vitals:   11/30/15 1805 11/30/15 1900 11/30/15 1908 11/30/15 1931  BP: 150/111   173/99  Pulse: 68 72  71  Temp:   97.8 F (36.6 C)   TempSrc:   Rectal   Resp: 11 22  17   SpO2: 98% 99%  99%   Blood pressure 173/99, pulse 71, temperature 97.8 F (36.6 C), temperature source Rectal, resp. rate 17, SpO2 99 %. PSYCH: She is alert and oriented x4; does not appear anxious does not appear depressed; affect is normal HEENT: Normocephalic and Atraumatic, Mucous membranes pink; PERRLA; EOM intact; Fundi:  Benign;  No scleral icterus, Nares: Patent, Oropharynx: Clear, Fair Dentition,    Neck:  FROM, No Cervical Lymphadenopathy nor Thyromegaly or Carotid Bruit; No JVD; Breasts:: Not examined CHEST WALL: No tenderness CHEST: Normal respiration, clear to auscultation bilaterally HEART: Regular rate and rhythm; +SEM at LSB, No  rubs or gallops BACK: No kyphosis or scoliosis; No CVA tenderness ABDOMEN: Positive Bowel Sounds,  Soft Non-Tender, No Rebound or Guarding; No Masses, No Organomegaly,  EXTREMITIES: No Cyanosis, Clubbing, or Edema; No Ulcerations. Genitalia: not examined PULSES: 2+ and symmetric SKIN: Normal hydration no rash or ulceration CNS:  Alert and Oriented x 4, Generalized Weakness No Focal  Deficits Vascular: pulses palpable throughout    Labs on Admission:  Basic Metabolic Panel:  Recent Labs Lab 11/25/15 1415 11/30/15 1823  NA 140 147*  K 3.9 4.0  CL 108 113*  CO2 24 18*  GLUCOSE 87 97  BUN 13 12  CREATININE 0.97 1.03*  CALCIUM 9.1 9.3   Liver Function Tests:  Recent Labs Lab 11/30/15 1823  AST 15  ALT 12*  ALKPHOS 63  BILITOT 0.7  PROT 6.4*  ALBUMIN 3.4*   No results for input(s):  LIPASE, AMYLASE in the last 168 hours. No results for input(s): AMMONIA in the last 168 hours. CBC:  Recent Labs Lab 11/25/15 1415 11/30/15 1823  WBC 10.7* 6.2  HGB 11.2* 11.8*  HCT 35.4* 37.4  MCV 87.4 84.8  PLT 235 246   Cardiac Enzymes: No results for input(s): CKTOTAL, CKMB, CKMBINDEX, TROPONINI in the last 168 hours.  BNP (last 3 results) No results for input(s): BNP in the last 8760 hours.  ProBNP (last 3 results) No results for input(s): PROBNP in the last 8760 hours.  CBG:  Recent Labs Lab 11/25/15 1443 11/30/15 1936  GLUCAP 68 86    Radiological Exams on Admission: Ct Head Wo Contrast  11/30/2015  CLINICAL DATA:  Altered mental status.  Confusion. EXAM: CT HEAD WITHOUT CONTRAST TECHNIQUE: Contiguous axial images were obtained from the base of the skull through the vertex without intravenous contrast. COMPARISON:  Head CT 11/10/2015, brain MRI 11/11/2015 FINDINGS: No intracranial hemorrhage, mass effect, or midline shift. Generalized atrophy and periventricular white matter change, stable from prior exam. No hydrocephalus. The basilar cisterns are patent. No evidence of territorial infarct. No intracranial fluid collection. Calvarium is intact. Included paranasal sinuses and mastoid air cells are well aerated. IMPRESSION: Stable atrophy and periventricular white matter change. No acute intracranial abnormality. Electronically Signed   By: Jeb Levering M.D.   On: 11/30/2015 19:49   Dg Chest Port 1 View  11/30/2015  CLINICAL DATA:  Altered  mental status EXAM: PORTABLE CHEST 1 VIEW COMPARISON:  11/25/2015 FINDINGS: Cardiac shadow is stable. The lungs are well aerated bilaterally. No focal infiltrate or sizable effusion is seen. No bony abnormality is noted. IMPRESSION: No active disease. Electronically Signed   By: Inez Catalina M.D.   On: 11/30/2015 18:43        Assessment/Plan:      65 y.o. female with  Principal Problem:    1.    Acute encephalopathy    Neuro Checks    Check TSH, and Ammonia level    IVFs    Empiric IV Rocephin    Neurology Following   Active Problems:    2.    Hypertension    PRN IV Hydralazine    Monitor BPs     3.    Multiple sclerosis (HCC)    Continue Tysabrii Rx      4.    UTI (lower urinary tract infection)    Urine C+S Sent    IV Rocpehin      5.    Dehydration    IVFs    Monitor BUN/Cr and Electrolytes      6.    DVT Prophylaxis    Lovenox      Code Status:     FULL CODE      Family Communication:  No Family Present    Disposition Plan:    Observation Status        Time spent:  92 Minutes      Theressa Millard [facility] Pager [phone]   If [place] Please Contact the Day Rounding Team MD for [facility]  If 7PM-7AM, Please Contact Night-Floor Coverage  [email or web address] Password [facility] 11/30/2015, 8:31 PM     ADDENDUM:   Patient was seen and examined on 11/30/2015

## 2015-12-01 ENCOUNTER — Encounter (HOSPITAL_COMMUNITY): Payer: Self-pay

## 2015-12-01 DIAGNOSIS — G4700 Insomnia, unspecified: Secondary | ICD-10-CM | POA: Diagnosis not present

## 2015-12-01 DIAGNOSIS — G35 Multiple sclerosis: Secondary | ICD-10-CM | POA: Diagnosis not present

## 2015-12-01 DIAGNOSIS — N179 Acute kidney failure, unspecified: Secondary | ICD-10-CM | POA: Diagnosis not present

## 2015-12-01 DIAGNOSIS — I10 Essential (primary) hypertension: Secondary | ICD-10-CM

## 2015-12-01 DIAGNOSIS — N390 Urinary tract infection, site not specified: Secondary | ICD-10-CM | POA: Diagnosis not present

## 2015-12-01 DIAGNOSIS — M6281 Muscle weakness (generalized): Secondary | ICD-10-CM | POA: Diagnosis not present

## 2015-12-01 DIAGNOSIS — F329 Major depressive disorder, single episode, unspecified: Secondary | ICD-10-CM | POA: Diagnosis present

## 2015-12-01 DIAGNOSIS — K5900 Constipation, unspecified: Secondary | ICD-10-CM | POA: Diagnosis present

## 2015-12-01 DIAGNOSIS — H3530 Unspecified macular degeneration: Secondary | ICD-10-CM | POA: Diagnosis present

## 2015-12-01 DIAGNOSIS — A047 Enterocolitis due to Clostridium difficile: Secondary | ICD-10-CM | POA: Diagnosis not present

## 2015-12-01 DIAGNOSIS — B9620 Unspecified Escherichia coli [E. coli] as the cause of diseases classified elsewhere: Secondary | ICD-10-CM | POA: Diagnosis present

## 2015-12-01 DIAGNOSIS — E860 Dehydration: Secondary | ICD-10-CM | POA: Diagnosis not present

## 2015-12-01 DIAGNOSIS — Z79899 Other long term (current) drug therapy: Secondary | ICD-10-CM | POA: Diagnosis not present

## 2015-12-01 DIAGNOSIS — R4182 Altered mental status, unspecified: Secondary | ICD-10-CM | POA: Diagnosis not present

## 2015-12-01 DIAGNOSIS — F411 Generalized anxiety disorder: Secondary | ICD-10-CM | POA: Diagnosis not present

## 2015-12-01 DIAGNOSIS — E876 Hypokalemia: Secondary | ICD-10-CM | POA: Diagnosis present

## 2015-12-01 DIAGNOSIS — G9340 Encephalopathy, unspecified: Secondary | ICD-10-CM | POA: Diagnosis not present

## 2015-12-01 DIAGNOSIS — E7800 Pure hypercholesterolemia, unspecified: Secondary | ICD-10-CM | POA: Diagnosis present

## 2015-12-01 DIAGNOSIS — R159 Full incontinence of feces: Secondary | ICD-10-CM | POA: Diagnosis present

## 2015-12-01 DIAGNOSIS — G92 Toxic encephalopathy: Secondary | ICD-10-CM | POA: Diagnosis present

## 2015-12-01 DIAGNOSIS — G40909 Epilepsy, unspecified, not intractable, without status epilepticus: Secondary | ICD-10-CM | POA: Diagnosis not present

## 2015-12-01 DIAGNOSIS — F419 Anxiety disorder, unspecified: Secondary | ICD-10-CM | POA: Diagnosis present

## 2015-12-01 DIAGNOSIS — E785 Hyperlipidemia, unspecified: Secondary | ICD-10-CM | POA: Diagnosis present

## 2015-12-01 DIAGNOSIS — M545 Low back pain: Secondary | ICD-10-CM | POA: Diagnosis not present

## 2015-12-01 DIAGNOSIS — R32 Unspecified urinary incontinence: Secondary | ICD-10-CM | POA: Diagnosis present

## 2015-12-01 LAB — CBC
HCT: 36.2 % (ref 36.0–46.0)
Hemoglobin: 11.5 g/dL — ABNORMAL LOW (ref 12.0–15.0)
MCH: 26.5 pg (ref 26.0–34.0)
MCHC: 31.8 g/dL (ref 30.0–36.0)
MCV: 83.4 fL (ref 78.0–100.0)
PLATELETS: 248 10*3/uL (ref 150–400)
RBC: 4.34 MIL/uL (ref 3.87–5.11)
RDW: 15.1 % (ref 11.5–15.5)
WBC: 13.6 10*3/uL — AB (ref 4.0–10.5)

## 2015-12-01 LAB — BASIC METABOLIC PANEL
Anion gap: 9 (ref 5–15)
BUN: 7 mg/dL (ref 6–20)
CO2: 22 mmol/L (ref 22–32)
CREATININE: 0.72 mg/dL (ref 0.44–1.00)
Calcium: 8.9 mg/dL (ref 8.9–10.3)
Chloride: 112 mmol/L — ABNORMAL HIGH (ref 101–111)
Glucose, Bld: 133 mg/dL — ABNORMAL HIGH (ref 65–99)
Potassium: 3.2 mmol/L — ABNORMAL LOW (ref 3.5–5.1)
SODIUM: 143 mmol/L (ref 135–145)

## 2015-12-01 LAB — AMMONIA: AMMONIA: 48 umol/L — AB (ref 9–35)

## 2015-12-01 LAB — TSH: TSH: 0.515 u[IU]/mL (ref 0.350–4.500)

## 2015-12-01 MED ORDER — ENOXAPARIN SODIUM 40 MG/0.4ML ~~LOC~~ SOLN
40.0000 mg | SUBCUTANEOUS | Status: DC
  Administered 2015-12-02 – 2015-12-03 (×2): 40 mg via SUBCUTANEOUS
  Filled 2015-12-01 (×2): qty 0.4

## 2015-12-01 MED ORDER — POTASSIUM CHLORIDE CRYS ER 20 MEQ PO TBCR
40.0000 meq | EXTENDED_RELEASE_TABLET | Freq: Four times a day (QID) | ORAL | Status: AC
  Administered 2015-12-01 (×2): 40 meq via ORAL
  Filled 2015-12-01 (×2): qty 2

## 2015-12-01 MED ORDER — SODIUM CHLORIDE 0.9 % IV SOLN
INTRAVENOUS | Status: AC
  Administered 2015-12-01: 11:00:00 via INTRAVENOUS

## 2015-12-01 MED ORDER — DEXTROSE 5 % IV SOLN
1.0000 g | INTRAVENOUS | Status: DC
  Administered 2015-12-01 – 2015-12-03 (×3): 1 g via INTRAVENOUS
  Filled 2015-12-01 (×4): qty 10

## 2015-12-01 MED ORDER — POLYETHYLENE GLYCOL 3350 17 G PO PACK
17.0000 g | PACK | Freq: Every day | ORAL | Status: DC
  Administered 2015-12-01: 17 g via ORAL
  Filled 2015-12-01 (×2): qty 1

## 2015-12-01 NOTE — Progress Notes (Signed)
Ms. Waple IV has infiltrated.  She is a very difficult stick and IV team is having trouble maintaining an IV. Lab had difficulty drawing labs earlier in the evening.  Dr Arnoldo Morale is on the floor and informed of the difficulties.  Orders received for a PICC line in the AM.  IV team has been able to establish a 24g in the right thumb.   It is unlikely that this IV will tolerate a large volume of fluid but it will enable [place] to give med's as needed.

## 2015-12-01 NOTE — Progress Notes (Signed)
[facility] PROGRESS NOTE   Wanda [name] [id number] DOB: 1950-10-30 DOA: 11/30/2015 PCP: JEFFERY,[name], PA-C  HPI/Subjective: Mental status improved since yesterday per nursing staff. Patient is awake, alert oriented 3. She still gets off on complex questions.  Assessment/Plan: Principal Problem:   Acute encephalopathy Active Problems:   Hypertension   Multiple sclerosis ([place])   UTI (lower urinary tract infection)   Dehydration    Acute encephalopathy Patient has recent admission to the hospital for the same presentation. Normal ammonia level, TSH, RPR, B12 and folate. Negative imaging including CT scan. This is likely secondary to polypharmacy versus secondary to UTI. Continue IV fluid hydration, continue empiric IV Rocephin.  UTI Urinalysis has 6-30 cells and many bacteria, started on Rocephin. Adjust antibiotics according to culture results.  Multiple sclerosis Patient follows with neurology as outpatient.  Dehydration Continue gentle hydration with IV fluids, check BMP in a.m.  Constipation Started on MiraLAX.  Code Status: Full Code Family Communication: Plan discussed with the patient. Disposition Plan: Remains inpatient Diet: Diet Heart Room service appropriate?: Yes; Fluid consistency:: Thin  Consultants:  None  Procedures:  None  Antibiotics:  None   Objective: Filed Vitals:   12/01/15 0619 12/01/15 1000  BP: 139/94 146/42  Pulse: 105 90  Temp: 98.2 F (36.8 C) 99.8 F (37.7 C)  Resp: 16 16    Intake/Output Summary (Last 24 hours) at 12/01/15 1220 Last data filed at 12/01/15 1122  Gross per 24 hour  Intake    360 ml  Output      2 ml  Net    358 ml   Filed Weights   11/30/15 2257  Weight: 50.2 kg (110 lb 10.7 oz)    Exam: General: Alert and awake, oriented x3, not in any acute distress. HEENT: anicteric sclera, pupils reactive to light and accommodation, EOMI CVS: S1-S2 clear, no murmur rubs or  gallops Chest: clear to auscultation bilaterally, no wheezing, rales or rhonchi Abdomen: soft nontender, nondistended, normal bowel sounds, no organomegaly Extremities: no cyanosis, clubbing or edema noted bilaterally Neuro: Cranial nerves II-XII intact, no focal neurological deficits  Data Reviewed: Basic Metabolic Panel:  Recent Labs Lab 11/25/15 1415 11/30/15 1823 12/01/15 0709  NA 140 147* 143  K 3.9 4.0 3.2*  CL 108 113* 112*  CO2 24 18* 22  GLUCOSE 87 97 133*  BUN 13 12 7   CREATININE 0.97 1.03* 0.72  CALCIUM 9.1 9.3 8.9   Liver Function Tests:  Recent Labs Lab 11/30/15 1823  AST 15  ALT 12*  ALKPHOS 63  BILITOT 0.7  PROT 6.4*  ALBUMIN 3.4*   No results for input(s): LIPASE, AMYLASE in the last 168 hours.  Recent Labs Lab 11/30/15 2325  AMMONIA 48*   CBC:  Recent Labs Lab 11/25/15 1415 11/30/15 1823 12/01/15 0709  WBC 10.7* 6.2 13.6*  HGB 11.2* 11.8* 11.5*  HCT 35.4* 37.4 36.2  MCV 87.4 84.8 83.4  PLT 235 246 248   Cardiac Enzymes: No results for input(s): CKTOTAL, CKMB, CKMBINDEX, TROPONINI in the last 168 hours. BNP (last 3 results) No results for input(s): BNP in the last 8760 hours.  ProBNP (last 3 results) No results for input(s): PROBNP in the last 8760 hours.  CBG:  Recent Labs Lab 11/25/15 1443 11/30/15 1936  GLUCAP 68 86    Micro No results found for this or any previous visit (from the past 240 hour(s)).   Studies: Ct Head Wo Contrast  11/30/2015  CLINICAL DATA:  Altered mental status.  Confusion. EXAM: CT HEAD WITHOUT CONTRAST TECHNIQUE: Contiguous axial images were obtained from the base of the skull through the vertex without intravenous contrast. COMPARISON:  Head CT 11/10/2015, brain MRI 11/11/2015 FINDINGS: No intracranial hemorrhage, mass effect, or midline shift. Generalized atrophy and periventricular white matter change, stable from prior exam. No hydrocephalus. The basilar cisterns are patent. No evidence of  territorial infarct. No intracranial fluid collection. Calvarium is intact. Included paranasal sinuses and mastoid air cells are well aerated. IMPRESSION: Stable atrophy and periventricular white matter change. No acute intracranial abnormality. Electronically Signed   By: Jeb Levering M.D.   On: 11/30/2015 19:49   Dg Chest Port 1 View  11/30/2015  CLINICAL DATA:  Altered mental status EXAM: PORTABLE CHEST 1 VIEW COMPARISON:  11/25/2015 FINDINGS: Cardiac shadow is stable. The lungs are well aerated bilaterally. No focal infiltrate or sizable effusion is seen. No bony abnormality is noted. IMPRESSION: No active disease. Electronically Signed   By: Inez Catalina M.D.   On: 11/30/2015 18:43    Scheduled Meds: . sodium chloride   Intravenous STAT  . cefTRIAXone (ROCEPHIN) IVPB 1 gram/50 mL D5W  1 g Intravenous Q24H  . enoxaparin (LOVENOX) injection  30 mg Subcutaneous Q24H  . modafinil  200 mg Oral Daily  . polyethylene glycol  17 g Oral Daily  . potassium chloride  40 mEq Oral Q6H   Continuous Infusions: . dextrose 5 % and 0.45% NaCl 75 mL/hr at 12/01/15 1139       Time spent: 35 minutes    [facility]  [facility] Pager [phone] If 7PM-7AM, please contact night-coverage at [email or web address], password [facility] 12/01/2015, 12:20 PM

## 2015-12-01 NOTE — Progress Notes (Signed)
D/w pt the need for  PICC placement.  Pt immediately began refusing, stating she has had a PICC in the past at another hospital and does not want another one.  Marc Morgans, RN notified of refusal.

## 2015-12-01 NOTE — Progress Notes (Signed)
Wanda [name] mentation is much improved this morning,  She is alert,oriented and concerned over a drug study she participates in with Dr Krista Blue, her neurologist.  She is scheduled to receive an injection of Baclofen in the office and she is very concerned about her physical decline if she misses a second dose.

## 2015-12-02 ENCOUNTER — Telehealth: Payer: Self-pay | Admitting: Neurology

## 2015-12-02 DIAGNOSIS — Z79899 Other long term (current) drug therapy: Secondary | ICD-10-CM | POA: Insufficient documentation

## 2015-12-02 LAB — BASIC METABOLIC PANEL
Anion gap: 12 (ref 5–15)
BUN: 5 mg/dL — AB (ref 6–20)
CALCIUM: 9.3 mg/dL (ref 8.9–10.3)
CO2: 20 mmol/L — ABNORMAL LOW (ref 22–32)
CREATININE: 0.92 mg/dL (ref 0.44–1.00)
Chloride: 112 mmol/L — ABNORMAL HIGH (ref 101–111)
Glucose, Bld: 109 mg/dL — ABNORMAL HIGH (ref 65–99)
Potassium: 4.7 mmol/L (ref 3.5–5.1)
SODIUM: 144 mmol/L (ref 135–145)

## 2015-12-02 LAB — CBC
HCT: 36.3 % (ref 36.0–46.0)
Hemoglobin: 12.2 g/dL (ref 12.0–15.0)
MCH: 27.4 pg (ref 26.0–34.0)
MCHC: 33.6 g/dL (ref 30.0–36.0)
MCV: 81.6 fL (ref 78.0–100.0)
PLATELETS: 291 10*3/uL (ref 150–400)
RBC: 4.45 MIL/uL (ref 3.87–5.11)
RDW: 15.4 % (ref 11.5–15.5)
WBC: 7.5 10*3/uL (ref 4.0–10.5)

## 2015-12-02 MED ORDER — TRAZODONE HCL 50 MG PO TABS: 25.0000 mg | ORAL_TABLET | Freq: Every day | ORAL | Status: DC

## 2015-12-02 MED ORDER — ZOLPIDEM TARTRATE 5 MG PO TABS
5.0000 mg | ORAL_TABLET | Freq: Once | ORAL | Status: AC
  Administered 2015-12-02: 5 mg via ORAL
  Filled 2015-12-02: qty 1

## 2015-12-02 MED ORDER — OXYCODONE HCL 5 MG PO TABS: 5.0000 mg | ORAL_TABLET | Freq: Four times a day (QID) | ORAL | Status: DC | PRN

## 2015-12-02 MED ORDER — POTASSIUM CHLORIDE CRYS ER 20 MEQ PO TBCR
40.0000 meq | EXTENDED_RELEASE_TABLET | Freq: Once | ORAL | Status: AC
  Administered 2015-12-02: 40 meq via ORAL
  Filled 2015-12-02: qty 2

## 2015-12-02 MED ORDER — LAMOTRIGINE 25 MG PO TABS: 100.0000 mg | ORAL_TABLET | Freq: Every day | ORAL | Status: DC

## 2015-12-02 MED ORDER — TRAZODONE HCL 50 MG PO TABS
25.0000 mg | ORAL_TABLET | Freq: Every evening | ORAL | Status: DC | PRN
  Administered 2015-12-03: 25 mg via ORAL
  Filled 2015-12-02: qty 1

## 2015-12-02 MED ORDER — LAMOTRIGINE 25 MG PO TABS
100.0000 mg | ORAL_TABLET | Freq: Every day | ORAL | Status: DC
  Administered 2015-12-02 – 2015-12-04 (×3): 100 mg via ORAL
  Filled 2015-12-02 (×3): qty 4

## 2015-12-02 MED ORDER — LAMOTRIGINE 25 MG PO TABS: 200.0000 mg | ORAL_TABLET | Freq: Every day | ORAL | Status: DC

## 2015-12-02 NOTE — Telephone Encounter (Signed)
As the patient is currently in hospital, the case manager/social worker should do this for her.  They can help make all the arrangements for placement when she no longer needs hospital care.  The daughter, [name], was going to bring her mother and the forms/paperwork that the SNF needs for placement at her selected facility (I believe, for example, that she'll need a PPD placed). All that can be done by the folks at the hospital.

## 2015-12-02 NOTE — Evaluation (Signed)
Occupational Therapy Evaluation Patient Details Name: LYANA DISTEL MRN: [id number] DOB: 06/19/1951 Today's Date: 12/02/2015    History of Present Illness This 65 y.o. female admitted with AMS. vomiting, and incontinence of urine and stool.   Head CT negative.  Neurology consulted and pt diagnosed with encephalopathy likely multifactorial including misuse of medications, UTI, and probable progressive dementia.  PMH includes:  MS, recurrent episodes of AMS, HTN, depression, anxiety, macular degeneration.    Clinical Impression   Pt admitted with above. She demonstrates the below listed deficits and will benefit from continued OT to maximize safety and independence with BADLs.  Pt presents to OT with impaired cognition, including impaired memory, variable orientation, intermittent confusion, decreased attention, decreased safety awareness, and impaired problem solving; Rt UE mild weakness, decreased balance.   She currently requires minA for ADLs and functional mobility and has a strong h/o falls.  Feel she will need SNF level rehab at discharge.       Follow Up Recommendations  SNF;Supervision/Assistance - 24 hour    Equipment Recommendations  None recommended by OT    Recommendations for Other Services       Precautions / Restrictions Precautions Precautions: Fall      Mobility Bed Mobility Overal bed mobility: Needs Assistance Bed Mobility: Supine to Sit;Sit to Supine Rolling: Supervision Sidelying to sit: Supervision       General bed mobility comments: supervision for safety   Transfers Overall transfer level: Needs assistance Equipment used: Rolling walker (2 wheeled) Transfers: Sit to/from [facility] Sit to Stand: Min assist Stand pivot transfers: Min assist       General transfer comment: min A due to decreased balance     Balance Overall balance assessment: Needs assistance Sitting-balance support: Feet supported Sitting balance-Leahy  Scale: Good     Standing balance support: Bilateral upper extremity supported Standing balance-Leahy Scale: Poor                              ADL Overall ADL's : Needs assistance/impaired Eating/Feeding: Set up;Bed level   Grooming: Wash/dry hands;Wash/dry face;Oral care;Brushing hair;Sitting;Supervision/safety   Upper Body Bathing: Supervision/ safety;Sitting   Lower Body Bathing: Minimal assistance;Sit to/from stand   Upper Body Dressing : Set up;Supervision/safety;Sitting   Lower Body Dressing: Minimal assistance;Sit to/from stand   Toilet Transfer: Minimal assistance;Ambulation;Comfort height toilet;BSC;RW   Toileting- Clothing Manipulation and Hygiene: Minimal assistance;Sit to/from stand       Functional mobility during ADLs: Minimal assistance;Rolling walker General ADL Comments: Pt requires min A due to impaired balance.   Pt frequently self distracts and requires assist to redirect back to task      Vision     Perception     Praxis      Pertinent Vitals/Pain Pain Assessment: No/denies pain     Hand Dominance Right   Extremity/Trunk Assessment Upper Extremity Assessment Upper Extremity Assessment: RUE deficits/detail RUE Deficits / Details: grossly 4/5    Lower Extremity Assessment Lower Extremity Assessment: Defer to PT evaluation       Communication Communication Communication: No difficulties   Cognition Arousal/Alertness: Awake/alert Behavior During Therapy: WFL for tasks assessed/performed Overall Cognitive Status: No family/caregiver present to determine baseline cognitive functioning Area of Impairment: Orientation;Attention;Memory;Safety/judgement;Problem solving Orientation Level: Disoriented to;Place (intermittently ) Current Attention Level: Selective;Sustained Memory: Decreased short-term memory   Safety/Judgement: Decreased awareness of safety;Decreased awareness of deficits   Problem Solving: Difficulty  sequencing;Requires verbal cues General Comments: Pt  with intermittent confusion.  She will be conversing with therapist, then make reference to being somewhere other than the hospital.  She insists someone moved her room around, and that there were two beds in there earlier    General Comments       Exercises       Shoulder Instructions      Home Living Family/patient expects to be discharged to:: Skilled nursing facility Living Arrangements: Alone Available Help at Discharge: Family;Available PRN/intermittently Type of Home: Apartment Home Access: Level entry     Home Layout: One level     Bathroom Shower/Tub: Teacher, early years/pre: Standard     Home Equipment: Tub bench;Bedside commode;Walker - 2 wheels;Walker - standard;Cane - quad;Transport chair   Additional Comments: Pt states her plan is for SNF and then to move in with her daughter       Prior Functioning/Environment Level of Independence: Needs assistance  Gait / Transfers Assistance Needed: Pt states she walks with quad cane  ADL's / Homemaking Assistance Needed: Pt states she dresses mod I, daughter assists with showering   Comments: Pt reports frequent falls     OT Diagnosis: Generalized weakness;Cognitive deficits   OT Problem List: Decreased strength;Decreased activity tolerance;Impaired balance (sitting and/or standing);Impaired vision/perception;Decreased cognition;Decreased safety awareness   OT Treatment/Interventions: Self-care/ADL training;Neuromuscular education;DME and/or AE instruction;Therapeutic activities;Cognitive remediation/compensation;Patient/family education;Balance training    OT Goals(Current goals can be found in the care plan section) Acute Rehab OT Goals Patient Stated Goal: I need skilled  OT Goal Formulation: With patient Time For Goal Achievement: 12/16/15 Potential to Achieve Goals: Good ADL Goals Pt Will Perform Grooming: with supervision;standing Pt Will  Perform Lower Body Bathing: with supervision;sit to/from stand Pt Will Perform Lower Body Dressing: with supervision;sit to/from stand Pt Will Transfer to Toilet: with supervision;ambulating;regular height toilet;bedside commode;grab bars Pt Will Perform Toileting - Clothing Manipulation and hygiene: with supervision;sit to/from stand Additional ADL Goal #1: Pt will maintain selective attention to familiar tasks x 5 mins.  Additional ADL Goal #2: Pt will consistently be oirented x 4.   OT Frequency: Min 2X/week   Barriers to D/C: Decreased caregiver support          Co-evaluation              End of Session Equipment Utilized During Treatment: Rolling walker Nurse Communication: Mobility status  Activity Tolerance: Patient tolerated treatment well Patient left: in bed;with call bell/phone within reach;with bed alarm set   Time: [id number] OT Time Calculation (min): 17 min Charges:  OT General Charges $OT Visit: 1 Procedure OT Evaluation $OT Eval Moderate Complexity: 1 Procedure G-Codes:    Johnpaul Gillentine M December 28, 2015, 6:04 PM

## 2015-12-02 NOTE — Telephone Encounter (Signed)
[name] called to let us know patient should be out of the hospital in 1-2 days.  She will call us to schedule her Tysabri infusion when she is sure of the date.

## 2015-12-02 NOTE — Progress Notes (Addendum)
[facility] PROGRESS NOTE   RECIE Wanda [name] [id number] DOB: 1951-06-27 DOA: 11/30/2015 PCP: Wanda Wanda [name]  HPI/Subjective: Patient alert, oriented times 3.  Feeling ok, wondering if she can get her infusion.  Wondering why she is getting frequent infection.   Assessment/Plan: Principal Problem:   Acute encephalopathy Active Problems:   Hypertension   Multiple sclerosis ([place])   UTI (lower urinary tract infection)   Dehydration   Acute encephalopathy Patient has recent admission to the hospital for the same presentation. Normal ammonia level, TSH, RPR, B12 and folate. Negative imaging including CT scan. This is likely secondary to polypharmacy versus secondary to UTI. Flexeril discontinue.  continue empiric IV Rocephin. Patient improving clinically.  Continue with modafinil. Will resume Lamictal lower dose.  Discussed with neurology, continue with lamitactal with lower dose, trazodone PRN for sleep. Continue with Provigil. Patient to follow up with primary neurology for further taper off medications.   UTI Urinalysis has 6-30 cells and many bacteria, started on Rocephin. Urine growing Gram Negative rods.  WBC increased yesterday. Follow trends.   Multiple sclerosis Patient follows with neurology as outpatient. Discussed with neurology hold on Tysabri due to UTI, need to treat UTI first.   Dehydration Continue gentle hydration with IV fluids.  Improved. NSL fluids.   Constipation Started on MiraLAX.  Hypokalemia; replace with 40 meq times 2.   Code Status: Full Code Family Communication: Plan discussed with the patient. Disposition Plan: Remains inpatient Diet: Diet Heart Room service appropriate?: Yes; Fluid consistency:: Thin  Consultants:  Neurology   Procedures:  None  Antibiotics:  Ceftriaxone 2-26   Objective: Filed Vitals:   12/02/15 0031 12/02/15 0442  BP: 173/105 152/88  Pulse: 80 105  Temp: 97.5 F (36.4 C) 97.8 F (36.6  C)  Resp: 17 18    Intake/Output Summary (Last 24 hours) at 12/02/15 0750 Last data filed at 12/01/15 1900  Gross per 24 hour  Intake 1151.25 ml  Output      1 ml  Net 1150.25 ml   Filed Weights   11/30/15 2257  Weight: 50.2 kg (110 lb 10.7 oz)    Exam: General: Alert and awake, oriented x3, not in any acute distress. CVS: S1-S2 clear, no murmur rubs or gallops Chest: clear to auscultation bilaterally, no wheezing, rales or rhonchi Abdomen: soft nontender, nondistended, normal bowel sounds, no organomegaly Extremities: no cyanosis, clubbing or edema noted bilaterally Neuro: Cranial nerves II-XII intact, no focal neurological deficits  Data Reviewed: Basic Metabolic Panel:  Recent Labs Lab 11/25/15 1415 11/30/15 1823 12/01/15 0709  NA 140 147* 143  K 3.9 4.0 3.2*  CL 108 113* 112*  CO2 24 18* 22  GLUCOSE 87 97 133*  BUN 13 12 7   CREATININE 0.97 1.03* 0.72  CALCIUM 9.1 9.3 8.9   Liver Function Tests:  Recent Labs Lab 11/30/15 1823  AST 15  ALT 12*  ALKPHOS 63  BILITOT 0.7  PROT 6.4*  ALBUMIN 3.4*   No results for input(s): LIPASE, AMYLASE in the last 168 hours.  Recent Labs Lab 11/30/15 2325  AMMONIA 48*   CBC:  Recent Labs Lab 11/25/15 1415 11/30/15 1823 12/01/15 0709  WBC 10.7* 6.2 13.6*  HGB 11.2* 11.8* 11.5*  HCT 35.4* 37.4 36.2  MCV 87.4 84.8 83.4  PLT 235 246 248   Cardiac Enzymes: No results for input(s): CKTOTAL, CKMB, CKMBINDEX, TROPONINI in the last 168 hours. BNP (last 3 results) No results for input(s): BNP in the last 8760 hours.  ProBNP (last  3 results) No results for input(s): PROBNP in the last 8760 hours.  CBG:  Recent Labs Lab 11/25/15 1443 11/30/15 1936  GLUCAP 68 86    Micro Recent Results (from the past 240 hour(s))  Urine culture     Status: None (Preliminary result)   Collection Time: 11/30/15  7:34 PM  Result Value Ref Range Status   Specimen Description URINE, CLEAN CATCH  Final   Special Requests  NONE  Final   Culture   Final    >=100,000 COLONIES/mL GRAM NEGATIVE RODS CULTURE REINCUBATED FOR BETTER GROWTH    Report Status PENDING  Incomplete     Studies: Ct Head Wo Contrast  11/30/2015  CLINICAL DATA:  Altered mental status.  Confusion. EXAM: CT HEAD WITHOUT CONTRAST TECHNIQUE: Contiguous axial images were obtained from the base of the skull through the vertex without intravenous contrast. COMPARISON:  Head CT 11/10/2015, brain MRI 11/11/2015 FINDINGS: No intracranial hemorrhage, mass effect, or midline shift. Generalized atrophy and periventricular white matter change, stable from prior exam. No hydrocephalus. The basilar cisterns are patent. No evidence of territorial infarct. No intracranial fluid collection. Calvarium is intact. Included paranasal sinuses and mastoid air cells are well aerated. IMPRESSION: Stable atrophy and periventricular white matter change. No acute intracranial abnormality. Electronically Signed   By: Jeb Levering M.D.   On: 11/30/2015 19:49   Dg Chest Port 1 View  11/30/2015  CLINICAL DATA:  Altered mental status EXAM: PORTABLE CHEST 1 VIEW COMPARISON:  11/25/2015 FINDINGS: Cardiac shadow is stable. The lungs are well aerated bilaterally. No focal infiltrate or sizable effusion is seen. No bony abnormality is noted. IMPRESSION: No active disease. Electronically Signed   By: Inez Catalina M.D.   On: 11/30/2015 18:43    Scheduled Meds: . cefTRIAXone (ROCEPHIN) IVPB 1 gram/50 mL D5W  1 g Intravenous Q24H  . enoxaparin (LOVENOX) injection  40 mg Subcutaneous Q24H  . modafinil  200 mg Oral Daily  . polyethylene glycol  17 g Oral Daily   Continuous Infusions: . dextrose 5 % and 0.45% NaCl 75 mL/hr at 12/02/15 0204       Time spent: 35 minutes    Wanda Wanda [name]  [facility] Pager [phone] If 7PM-7AM, please contact night-coverage at [email or web address], password [facility] 12/02/2015, 7:50 AM  LOS: 1 day

## 2015-12-02 NOTE — Telephone Encounter (Signed)
Wanda [name]

## 2015-12-02 NOTE — Telephone Encounter (Signed)
[name] called on behalf on the pt. [facility] and the pt has missed herTysarbri  treatment for the month. Can the pt get it at the hospital. Please all and advise [phone]

## 2015-12-02 NOTE — Progress Notes (Signed)
Interval History:                                                                                                                      Wanda [name] is an 65 y.o. female patient who presented with altered mental status, believed to be secondary to polypharmacy. She is on several neuropsychiatric and pain medications, importantly high dose of baclofen 40 mg twice a day. She reportedly taking this medication as part of some study, for the past several years. At admission, several of her pain medications have been held including baclofen. Her mental status is returned to baseline. She pointed feeling better than before. No new neurological symptoms status at this time.    Past Medical History: Past Medical History  Diagnosis Date  . Elevated cholesterol   . Hypertension   . Arthritis   . Cervical dysplasia   . Depression   . Anxiety   . Macular degeneration of right eye 2001  . MS (multiple sclerosis) ([place]) 08/2013  . Acute encephalopathy 03/12/2015  . Anemia     Past Surgical History  Procedure Laterality Date  . Colposcopy    . Knee surgery Bilateral     "had cortisone injections in my knees"  . Cholecystectomy    . Tubal ligation    . Tonsillectomy    . Colonoscopy w/ polypectomy      Family History: Family History  Problem Relation Age of Onset  . Cancer Mother     Colon  . Diabetes Mother   . Hypertension Mother   . Arthritis Mother   . Cancer Father     prostate  . Kidney disease Brother     congenital single kidney  . Arthritis Sister   . Arthritis Sister   . Hematuria Son   . Gout Brother   . Multiple sclerosis Brother   . Arthritis Brother   . HIV Brother   . Cancer Brother     spinal    Social History:   reports that she quit smoking about 2 years ago. Her smoking use included Cigarettes. She has a 45 pack-year smoking history. She has never used smokeless tobacco. She reports that she drinks alcohol. She reports that she does not use illicit  drugs.  Allergies:  Allergies  Allergen Reactions  . Baclofen Other (See Comments)    Confusion, muscle weakness, lethargy per daughter     Medications:  Current facility-administered medications:  .  acetaminophen (TYLENOL) tablet 650 mg, 650 mg, Oral, Q6H PRN **OR** acetaminophen (TYLENOL) suppository 650 mg, 650 mg, Rectal, Q6H PRN, Theressa Millard, MD .  alum & mag hydroxide-simeth (MAALOX/MYLANTA) 200-200-20 MG/5ML suspension 30 mL, 30 mL, Oral, Q6H PRN, Theressa Millard, MD .  cefTRIAXone (ROCEPHIN) 1 g in dextrose 5 % 50 mL IVPB, 1 g, Intravenous, Q24H, Theressa Millard, MD, 1 g at 12/01/15 2031 .  dextrose 5 %-0.45 % sodium chloride infusion, , Intravenous, Continuous, Theressa Millard, MD, Last Rate: 75 mL/hr at 12/02/15 1505 .  enoxaparin (LOVENOX) injection 40 mg, 40 mg, Subcutaneous, Q24H, Rolla Flatten, RPH, 40 mg at 12/02/15 [id number] .  hydrALAZINE (APRESOLINE) injection 10 mg, 10 mg, Intravenous, Q6H PRN, Theressa Millard, MD, 10 mg at 12/02/15 0047 .  lamoTRIgine (LAMICTAL) tablet 100 mg, 100 mg, Oral, QHS, Belkys A Regalado, MD .  modafinil (PROVIGIL) tablet 200 mg, 200 mg, Oral, Daily, Theressa Millard, MD, 200 mg at 12/02/15 0843 .  ondansetron (ZOFRAN) tablet 4 mg, 4 mg, Oral, Q6H PRN **OR** ondansetron (ZOFRAN) injection 4 mg, 4 mg, Intravenous, Q6H PRN, Theressa Millard, MD .  oxyCODONE (Oxy IR/ROXICODONE) immediate release tablet 5 mg, 5 mg, Oral, Q6H PRN, Belkys A Regalado, MD .  polyethylene glycol (MIRALAX / GLYCOLAX) packet 17 g, 17 g, Oral, Daily, Verlee Monte, MD, 17 g at 12/01/15 1104 .  senna-docusate (Senokot-S) tablet 1 tablet, 1 tablet, Oral, QHS PRN, Theressa Millard, MD .  traZODone (DESYREL) tablet 25 mg, 25 mg, Oral, QHS PRN, Belkys A Regalado, MD   Neurologic Examination:                                                                                                       Blood pressure 165/107, pulse 121, temperature 97.8 F (36.6 C), temperature source Oral, resp. rate 18, height 5' (1.524 m), weight 50.2 kg (110 lb 10.7 oz), SpO2 100 %.  Evaluation of higher integrative functions including: Level of alertness: Alert,  Oriented to time, place and person Attention span and concentration  - intact   Speech: fluent, no evidence of dysarthria or aphasia noted.  Test the following cranial nerves: 2-12 grossly intact Motor examination: Normal tone, bulk, full 5/5 motor strength in all 4 extremities Test coordination: Normal finger nose testing, with no evidence of limb appendicular ataxia or abnormal involuntary movements or tremors noted.     Lab Results: Basic Metabolic Panel:  Recent Labs Lab 11/30/15 1823 12/01/15 0709 12/02/15 0729  NA 147* 143 144  K 4.0 3.2* 4.7  CL 113* 112* 112*  CO2 18* 22 20*  GLUCOSE 97 133* 109*  BUN 12 7 5*  CREATININE 1.03* 0.72 0.92  CALCIUM 9.3 8.9 9.3    Liver Function Tests:  Recent Labs Lab 11/30/15 1823  AST 15  ALT 12*  ALKPHOS 63  BILITOT 0.7  PROT 6.4*  ALBUMIN 3.4*   No results for input(s): LIPASE, AMYLASE in the last 168 hours.  Recent Labs Lab 11/30/15 2325  AMMONIA 48*    CBC:  Recent Labs Lab 11/30/15 1823 12/01/15 0709 12/02/15 0729  WBC 6.2 13.6* 7.5  HGB 11.8* 11.5* 12.2  HCT 37.4 36.2 36.3  MCV 84.8 83.4 81.6  PLT 246 248 291    Cardiac Enzymes: No results for input(s): CKTOTAL, CKMB, CKMBINDEX, TROPONINI in the last 168 hours.  Lipid Panel: No results for input(s): CHOL, TRIG, HDL, CHOLHDL, VLDL, LDLCALC in the last 168 hours.  CBG:  Recent Labs Lab 11/30/15 1936  GLUCAP 86    Microbiology: Results for orders placed or performed during the hospital encounter of 11/30/15  Urine culture     Status: None (Preliminary result)   Collection Time: 11/30/15  7:34 PM  Result Value Ref Range Status    Specimen Description URINE, CLEAN CATCH  Final   Special Requests NONE  Final   Culture >=100,000 COLONIES/mL ESCHERICHIA COLI  Final   Report Status PENDING  Incomplete    Imaging: Ct Head Wo Contrast  11/30/2015  CLINICAL DATA:  Altered mental status.  Confusion. EXAM: CT HEAD WITHOUT CONTRAST TECHNIQUE: Contiguous axial images were obtained from the base of the skull through the vertex without intravenous contrast. COMPARISON:  Head CT 11/10/2015, brain MRI 11/11/2015 FINDINGS: No intracranial hemorrhage, mass effect, or midline shift. Generalized atrophy and periventricular white matter change, stable from prior exam. No hydrocephalus. The basilar cisterns are patent. No evidence of territorial infarct. No intracranial fluid collection. Calvarium is intact. Included paranasal sinuses and mastoid air cells are well aerated. IMPRESSION: Stable atrophy and periventricular white matter change. No acute intracranial abnormality. Electronically Signed   By: Jeb Levering M.D.   On: 11/30/2015 19:49   Dg Chest Port 1 View  11/30/2015  CLINICAL DATA:  Altered mental status EXAM: PORTABLE CHEST 1 VIEW COMPARISON:  11/25/2015 FINDINGS: Cardiac shadow is stable. The lungs are well aerated bilaterally. No focal infiltrate or sizable effusion is seen. No bony abnormality is noted. IMPRESSION: No active disease. Electronically Signed   By: Inez Catalina M.D.   On: 11/30/2015 18:43    Assessment and plan:   Wanda [name] is an 65 y.o. female patient who presented with altered mental status, with significant improved mental status during auscultation after baclofen has been held. I suspect that high dose of baclofen 40 mg twice a days is the most likely contributing factor for the cause of altered mental status in combination with other neuropsychiatric and pain medications. At this time recommend discontinuing baclofen. She may continue her other home medications including Lamictal, modafinil and when  necessary small dose of trazodone at bedtime for insomnia.  She is advised to follow-up with her regular outpatient neurologist to continue Tysabri infusions, and to monitor her medications carefully.  Several of her questions have been answered to her satisfaction.   No other new neurological issue status at this time. Will sign off.  please call for any further questions.

## 2015-12-02 NOTE — Telephone Encounter (Signed)
Spoke with pt's daughter. Pt is not out of the hospital yet but she will follow up with [name] to discuss details.

## 2015-12-02 NOTE — Telephone Encounter (Signed)
[name] is calling back in  Regard to the patient's Tysabri treatment.  Please call.

## 2015-12-03 ENCOUNTER — Telehealth: Payer: Self-pay | Admitting: Neurology

## 2015-12-03 DIAGNOSIS — B9620 Unspecified Escherichia coli [E. coli] as the cause of diseases classified elsewhere: Secondary | ICD-10-CM | POA: Insufficient documentation

## 2015-12-03 DIAGNOSIS — A0472 Enterocolitis due to Clostridium difficile, not specified as recurrent: Secondary | ICD-10-CM | POA: Insufficient documentation

## 2015-12-03 DIAGNOSIS — N390 Urinary tract infection, site not specified: Secondary | ICD-10-CM

## 2015-12-03 LAB — URINE CULTURE

## 2015-12-03 LAB — BASIC METABOLIC PANEL
Anion gap: 11 (ref 5–15)
CALCIUM: 9.6 mg/dL (ref 8.9–10.3)
CO2: 23 mmol/L (ref 22–32)
Chloride: 108 mmol/L (ref 101–111)
Creatinine, Ser: 0.88 mg/dL (ref 0.44–1.00)
GFR calc Af Amer: >60 mL/min (ref >60)
GLUCOSE: 110 mg/dL — AB (ref 65–99)
Potassium: 3.9 mmol/L (ref 3.5–5.1)
Sodium: 142 mmol/L (ref 135–145)

## 2015-12-03 LAB — C DIFFICILE QUICK SCREEN W PCR REFLEX
C Diff antigen: POSITIVE — AB
C Diff interpretation: POSITIVE
C Diff toxin: POSITIVE — AB

## 2015-12-03 LAB — CBC
HCT: 40 % (ref 36.0–46.0)
Hemoglobin: 12.8 g/dL (ref 12.0–15.0)
MCH: 26.6 pg (ref 26.0–34.0)
MCHC: 32 g/dL (ref 30.0–36.0)
MCV: 83.2 fL (ref 78.0–100.0)
PLATELETS: 325 10*3/uL (ref 150–400)
RBC: 4.81 MIL/uL (ref 3.87–5.11)
RDW: 15.5 % (ref 11.5–15.5)
WBC: 7.9 10*3/uL (ref 4.0–10.5)

## 2015-12-03 LAB — GLUCOSE, CAPILLARY
GLUCOSE-CAPILLARY: 88 mg/dL (ref 65–99)
GLUCOSE-CAPILLARY: 148 mg/dL — AB (ref 65–99)

## 2015-12-03 MED ORDER — DEXTROSE-NACL 5-0.45 % IV SOLN
INTRAVENOUS | Status: AC
  Administered 2015-12-04: 02:00:00 via INTRAVENOUS

## 2015-12-03 MED ORDER — SACCHAROMYCES BOULARDII 250 MG PO CAPS
250.0000 mg | ORAL_CAPSULE | Freq: Two times a day (BID) | ORAL | Status: DC
  Administered 2015-12-03 – 2015-12-05 (×5): 250 mg via ORAL
  Filled 2015-12-03 (×5): qty 1

## 2015-12-03 MED ORDER — VANCOMYCIN 50 MG/ML ORAL SOLUTION
125.0000 mg | Freq: Four times a day (QID) | ORAL | Status: DC
  Administered 2015-12-03 – 2015-12-05 (×8): 125 mg via ORAL
  Filled 2015-12-03 (×11): qty 2.5

## 2015-12-03 NOTE — NC FL2 (Signed)
[place] MEDICAID FL2 LEVEL OF CARE SCREENING TOOL     IDENTIFICATION  Patient Name: Wanda [name] Birthdate: December 10, 1950 Sex: female Admission Date (Current Location): 11/30/2015  [facility] and [place] Number:  Herbalist and Address:  [facility]. [facility], [place] [address], [place], [place] [address]      Provider Number: [id number]  Attending Physician Name and Address:  Modena Jansky, MD  Relative Name and Phone Number:   (daughter Nadara Mode))    Current Level of Care: Hospital Recommended Level of Care: [place] Prior Approval Number:    Date Approved/Denied:   PASRR Number:    Discharge Plan: SNF    Current Diagnoses: Patient Active Problem List   Diagnosis Date Noted  . Polypharmacy   . Dehydration 11/30/2015  . Acute encephalopathy 11/10/2015  . Eye pain   . Right sided weakness   . Altered level of consciousness 10/04/2015  . UTI (lower urinary tract infection) 09/16/2015  . Acute confusional state 08/08/2015  . Normocytic anemia 07/30/2015  . Altered mental status 07/29/2015  . Hypokalemia   . Gait difficulty 08/10/2014  . Multiple sclerosis ([place]) 10/25/2013  . Insomnia 03/23/2013  . Fibroids 12/29/2012  . Elevated cholesterol   . Hypertension   . Arthritis   . Cervical dysplasia     Orientation RESPIRATION BLADDER Height & Weight     Self  Normal Continent Weight: 110 lb 10.7 oz (50.2 kg) Height:  5' (152.4 cm)  BEHAVIORAL SYMPTOMS/MOOD NEUROLOGICAL BOWEL NUTRITION STATUS      Continent    AMBULATORY STATUS COMMUNICATION OF NEEDS Skin   Limited Assist Verbally Normal                       Personal Care Assistance Level of Assistance  Bathing, Feeding, Dressing Bathing Assistance: Limited assistance Feeding assistance: Limited assistance Dressing Assistance: Limited assistance     Functional Limitations Info  Hearing, Speech Sight Info: Adequate Hearing Info: Adequate Speech Info:  Adequate    SPECIAL CARE FACTORS FREQUENCY  OT (By licensed OT)     PT Frequency: not able assess right now OT Frequency: Min 2X/week            Contractures      Additional Factors Info  Code Status, Allergies Code Status Info: full Allergies Info: Baclofen           Current Medications (12/03/2015):  This is the current hospital active medication list Current Facility-Administered Medications  Medication Dose Route Frequency Provider Last Rate Last Dose  . acetaminophen (TYLENOL) tablet 650 mg  650 mg Oral Q6H PRN Theressa Millard, MD       Or  . acetaminophen (TYLENOL) suppository 650 mg  650 mg Rectal Q6H PRN Theressa Millard, MD      . alum & mag hydroxide-simeth (MAALOX/MYLANTA) 200-200-20 MG/5ML suspension 30 mL  30 mL Oral Q6H PRN Theressa Millard, MD      . cefTRIAXone (ROCEPHIN) 1 g in dextrose 5 % 50 mL IVPB  1 g Intravenous Q24H Theressa Millard, MD   1 g at 12/02/15 2031  . dextrose 5 %-0.45 % sodium chloride infusion   Intravenous Continuous Modena Jansky, MD 50 mL/hr at 12/03/15 1045    . hydrALAZINE (APRESOLINE) injection 10 mg  10 mg Intravenous Q6H PRN Theressa Millard, MD   10 mg at 12/02/15 0047  . lamoTRIgine (LAMICTAL) tablet 100 mg  100  mg Oral QHS Belkys A Regalado, MD   100 mg at 12/02/15 2140  . modafinil (PROVIGIL) tablet 200 mg  200 mg Oral Daily Theressa Millard, MD   200 mg at 12/02/15 0843  . ondansetron (ZOFRAN) tablet 4 mg  4 mg Oral Q6H PRN Theressa Millard, MD       Or  . ondansetron (ZOFRAN) injection 4 mg  4 mg Intravenous Q6H PRN Harvette Evonnie Dawes, MD      . oxyCODONE (Oxy IR/ROXICODONE) immediate release tablet 5 mg  5 mg Oral Q6H PRN Belkys A Regalado, MD      . saccharomyces boulardii (FLORASTOR) capsule 250 mg  250 mg Oral BID Modena Jansky, MD   250 mg at 12/03/15 1211  . traZODone (DESYREL) tablet 25 mg  25 mg Oral QHS PRN Belkys A Regalado, MD      . vancomycin (VANCOCIN) 50 mg/mL oral solution 125 mg  125 mg  Oral 4 times per day Modena Jansky, MD   125 mg at 12/03/15 1342     Discharge Medications: Please see discharge summary for a list of discharge medications.  Relevant Imaging Results:  Relevant Lab Results:   Additional Information SS# [id number]  Minta Balsam  BSW intern  [phone]

## 2015-12-03 NOTE — Progress Notes (Signed)
Blood found in patient's stool this morning.  Daughter concerned.  Will page MD

## 2015-12-03 NOTE — Clinical Social Work Placement (Signed)
   CLINICAL SOCIAL WORK PLACEMENT  NOTE  Date:  12/03/2015  Patient Details  Name: Wanda [name] MRN: [id number] Date of Birth: [birth year]  Clinical Social Work is seeking post-discharge placement for this patient at the [place] level of care (*CSW will initial, date and re-position this form in  chart as items are completed):  Yes   Patient/family provided with [facility] Work Department's list of facilities offering this level of care within the geographic area requested by the patient (or if unable, by the patient's family).  Yes   Patient/family informed of their freedom to choose among providers that offer the needed level of care, that participate in Medicare, Medicaid or managed care program needed by the patient, have an available bed and are willing to accept the patient.  Yes   Patient/family informed of [facility]'s ownership interest in [facility] and [facility], as well as of the fact that they are under no obligation to receive care at these facilities.  PASRR submitted to EDS on       PASRR number received on       Existing PASRR number confirmed on 12/03/15     FL2 transmitted to all facilities in geographic area requested by pt/family on 12/03/15     FL2 transmitted to all facilities within larger geographic area on       Patient informed that his/her managed care company has contracts with or will negotiate with certain facilities, including the following:            Patient/family informed of bed offers received.  Patient chooses bed at       Physician recommends and patient chooses bed at      Patient to be transferred to   on  .  Patient to be transferred to facility by       Patient family notified on   of transfer.  Name of family member notified:        PHYSICIAN Please sign FL2     Additional Comment:    _______________________________________________ Cranford Mon, LCSW 12/03/2015, 4:47  PM

## 2015-12-03 NOTE — Clinical Social Work Note (Signed)
Clinical Social Work Assessment  Patient Details  Name: Wanda [name] MRN: [id number] Date of Birth: October 06, 1950  Date of referral:  12/03/15               Reason for consult:  Facility Placement                Permission sought to share information with:  Facility Sport and exercise psychologist, Family Supports Permission granted to share information::  Yes, Verbal Permission Granted  Name::     [place]::  [facility]  Relationship::  dtr  Contact Information:     Housing/Transportation Living arrangements for the past 2 months:  [place] of Information:  Patient, Adult Children Patient Interpreter Needed:  None Criminal Activity/Legal Involvement Pertinent to Current Situation/Hospitalization:    Significant Relationships:  Adult Children Lives with:  Self Do you feel safe going back to the place where you live?  No Need for family participation in patient care:  Yes (Comment) (pt states daughter helping with DC plan)  Care giving concerns: Pt lives alone at home and daughter has continuing concerns about pt safety at home especially with current mobility   Facilities manager / plan:  CSW spoke with pt concerning PT recommendation for SNF- pt reports feeling poorly and states that her daughter is coordinating her rehab for her.  CSW spoke with daughter who states she is interested in rehab- prefers facility in [place]  Employment status:  Retired Insurance underwriter information:  Medicare PT Recommendations:  [place] / Referral to community resources:  [place]  Patient/Family's Response to care:  Pt and pt daughter agreeable to plan.   Patient/Family's Understanding of and Emotional Response to Diagnosis, Current Treatment, and Prognosis: Pt daughter is relieved that patient will be having a qualifying stay this admission- has been very frustrated trying to get her mom rehab over  the past 2 months.  Emotional Assessment Appearance:  Appears stated age Attitude/Demeanor/Rapport:    Affect (typically observed):  Appropriate Orientation:  Oriented to Situation, Oriented to  Time, Oriented to Place, Oriented to Self Alcohol / Substance use:  Not Applicable Psych involvement (Current and /or in the community):  No (Comment)  Discharge Needs  Concerns to be addressed:  Care Coordination Readmission within the last 30 days:    Current discharge risk:  Physical Impairment Barriers to Discharge:  Continued Medical Work up   Cranford Mon, LCSW 12/03/2015, 4:42 PM

## 2015-12-03 NOTE — Care Management Note (Signed)
Case Management Note  Patient Details  Name: Wanda [name] MRN: [id number] Date of Birth: 1951-08-15  Subjective/Objective:                 Patient from home in an apartment, has history of MS. Was DC's from [facility] ER 2/14 with [facility] for RN HHA PT OT SW. Patient with C diff UTI and AMS on admission. 5 admissions 4 er visits in past 6 months. Anticipate DC to SNF this admission. CSW is aware.    Action/Plan:  Patient will need [place] if approval can be obtained for [facility] needs at discharge or after SNF.  Expected Discharge Date:                  Expected Discharge Plan:  Skilled Nursing Facility  In-House Referral:  Clinical Social Work  Discharge planning Services  CM Consult  Post Acute Care Choice:    Choice offered to:  Patient  DME Arranged:    DME Agency:     HH Arranged:    [place] Agency:     Status of Service:  In process, will continue to follow  Medicare Important Message Given:    Date Medicare IM Given:    Medicare IM give by:    Date Additional Medicare IM Given:    Additional Medicare Important Message give by:     If discussed at [place] of Stay Meetings, dates discussed:    Additional Comments:  Carles Collet, RN 12/03/2015, 3:22 PM

## 2015-12-03 NOTE — Progress Notes (Signed)
PT Cancellation Note  Patient Details Name: NATALY MCCULLEY MRN: [id number] DOB: 02-26-1951   Cancelled Treatment:    Reason Eval/Treat Not Completed: Medical issues which prohibited therapy (Pt reports she is having almost constant diarrhea)   Dhilan Brauer 12/03/2015, 11:01 AM Suanne Marker PT [phone]

## 2015-12-03 NOTE — Progress Notes (Signed)
Patient had an episode of blood in her stool today.  Also the patient's daughter did not want her to get the provigil this morning.  Daughter wanted to speak with the doctor about her taking the Provigil, daughter expressed that she thought the patient did not need that medication.  Discussed this with Dr. Algis Liming about this and he said that the daughter needs to talk with the outpatient doctor that prescribed it prior to admission

## 2015-12-03 NOTE — Progress Notes (Signed)
[facility] PROGRESS NOTE   Wanda [name] [id number] DOB: [birth year] DOA: 11/30/2015 PCP: Wynne Dust  Brief narrative 65 year old female with history of multiple sclerosis (follows with Dr. Krista Blue at [facility]), HTN, HLD, anxiety, depression, anemia, presented to [facility] ED on 11/30/15 due to increased confusion. She was hospitalized on 11/11/15 for similar reasons. She was started on IV Rocephin empirically for presumed UTI. CT head negative for acute findings. Mental status changes were attributed mostly to polypharmacy and confusion seems to have resolved.   Assessment/Plan: Principal Problem:   Acute encephalopathy Active Problems:   Hypertension   Multiple sclerosis ([place])   UTI (lower urinary tract infection)   Dehydration   Polypharmacy   Acute encephalopathy Patient has recent admission to the hospital for the same presentation.  TSH normal, RPR nonreactive, B12: 2060 and folate: Normal. HIV: Nonreactive. Ammonia mildly elevated at 48 probably of no significance. Negative imaging including CT scan. This is likely secondary to polypharmacy (especially high-dose baclofen in combination with other neuropsychiatric and pain medications) versus secondary to UTI. Discontinued baclofen.  continue empiric IV Rocephin. Continue with modafinil. Resumed Lamictal lower dose.  Dr. Tyrell Antonio discussed with neurology, continue with lamitactal with lower dose, trazodone PRN for sleep. Continue with Provigil. Patient to follow up with primary neurology for further taper off medications.  - Acute encephalopathy seems to have resolved.  Escherichia coli UTI - patient states that this is her third UTI in the last 1 month. Denies history of dysuria. - Treating with IV Rocephin (transition to oral antibiotics at discharge to complete total one-week course)  C. difficile diarrhea - Start oral vancomycin 14 days and add probiotics. Discontinue laxatives.  Multiple  sclerosis Patient follows with Dr. Krista Blue neurology as outpatient. Discussed with neurology hold on Tysabri due to UTI, need to treat UTI first.  - She has history of chronic right-sided weakness.  Dehydration Resolved after IV fluid hydration  Constipation Resolved and now having diarrhea due to C. difficile. Discontinue laxatives and MiraLAX.  Hypokalemia;  -  replaced.   Essential hypertension - Mildly uncontrolled.  DVT prophylaxis:  Code Status: Full Code Family Communication: Plan discussed with the patient. Disposition Plan: Remains inpatient. DC home when medically stable.   Consultants:  Neurology -signed off.   Procedures:  None  Antibiotics:  Ceftriaxone 2-26 >   Subjective Having diarrhea with minimal blood in stools (witnessed) denies abdominal pain. Has chronic right-sided weakness.   Objective: Filed Vitals:   12/02/15 2118 12/02/15 2358 12/03/15 0224 12/03/15 0445  BP: 160/96 165/110 160/94 151/90  Pulse: 100 109 98 99  Temp:  98.6 F (37 C)  98.3 F (36.8 C)  TempSrc:  Oral  Oral  Resp:  16  18  Height:      Weight:      SpO2:  100% 98% 98%     Intake/Output Summary (Last 24 hours) at 12/03/15 1018 Last data filed at 12/02/15 2031  Gross per 24 hour  Intake   1675 ml  Output      0 ml  Net   1675 ml   Filed Weights   11/30/15 2257  Weight: 50.2 kg (110 lb 10.7 oz)    Exam: General: Alert and awake, oriented x3, not in any acute distress. CVS: S1-S2 clear, no murmur rubs or gallops Chest: clear to auscultation bilaterally, no wheezing, rales or rhonchi Abdomen: soft nontender, nondistended, normal bowel sounds, no organomegaly Extremities: no cyanosis, clubbing or edema noted bilaterally. Chronic right-sided weakness.  Neuro: Cranial nerves II-XII intact, no focal neurological deficits  Data Reviewed: Basic Metabolic Panel:  Recent Labs Lab 11/30/15 1823 12/01/15 0709 12/02/15 0729 12/03/15 0806  NA 147* 143 144 142  K  4.0 3.2* 4.7 3.9  CL 113* 112* 112* 108  CO2 18* 22 20* 23  GLUCOSE 97 133* 109* 110*  BUN 12 7 5* <5*  CREATININE 1.03* 0.72 0.92 0.88  CALCIUM 9.3 8.9 9.3 9.6   Liver Function Tests:  Recent Labs Lab 11/30/15 1823  AST 15  ALT 12*  ALKPHOS 63  BILITOT 0.7  PROT 6.4*  ALBUMIN 3.4*   No results for input(s): LIPASE, AMYLASE in the last 168 hours.  Recent Labs Lab 11/30/15 2325  AMMONIA 48*   CBC:  Recent Labs Lab 11/30/15 1823 12/01/15 0709 12/02/15 0729 12/03/15 0806  WBC 6.2 13.6* 7.5 7.9  HGB 11.8* 11.5* 12.2 12.8  HCT 37.4 36.2 36.3 40.0  MCV 84.8 83.4 81.6 83.2  PLT 246 248 291 325   Cardiac Enzymes: No results for input(s): CKTOTAL, CKMB, CKMBINDEX, TROPONINI in the last 168 hours. BNP (last 3 results) No results for input(s): BNP in the last 8760 hours.  ProBNP (last 3 results) No results for input(s): PROBNP in the last 8760 hours.  CBG:  Recent Labs Lab 11/30/15 1936  GLUCAP 86    Micro Recent Results (from the past 240 hour(s))  Urine culture     Status: None   Collection Time: 11/30/15  7:34 PM  Result Value Ref Range Status   Specimen Description URINE, CLEAN CATCH  Final   Special Requests NONE  Final   Culture >=100,000 COLONIES/mL ESCHERICHIA COLI  Final   Report Status 12/03/2015 FINAL  Final   Organism ID, Bacteria ESCHERICHIA COLI  Final      Susceptibility   Escherichia coli - MIC*    AMPICILLIN >=32 RESISTANT Resistant     CEFAZOLIN <=4 SENSITIVE Sensitive     CEFTRIAXONE <=1 SENSITIVE Sensitive     CIPROFLOXACIN <=0.25 SENSITIVE Sensitive     GENTAMICIN <=1 SENSITIVE Sensitive     IMIPENEM <=0.25 SENSITIVE Sensitive     NITROFURANTOIN <=16 SENSITIVE Sensitive     TRIMETH/SULFA >=320 RESISTANT Resistant     AMPICILLIN/SULBACTAM 16 INTERMEDIATE Intermediate     PIP/TAZO <=4 SENSITIVE Sensitive     * >=100,000 COLONIES/mL ESCHERICHIA COLI  C difficile quick scan w PCR reflex     Status: Abnormal   Collection Time:  12/03/15  6:35 AM  Result Value Ref Range Status   C Diff antigen POSITIVE (A) NEGATIVE Final   C Diff toxin POSITIVE (A) NEGATIVE Final   C Diff interpretation Positive for toxigenic C. difficile  Final    Comment: CRITICAL RESULT CALLED TO, READ BACK BY AND VERIFIED WITH: SPOKE TO RN [email or web address] @ 08:31 ON 12/03/2015 BY K.[email or web address]      Studies: No results found.  Scheduled Meds: . cefTRIAXone (ROCEPHIN) IVPB 1 gram/50 mL D5W  1 g Intravenous Q24H  . enoxaparin (LOVENOX) injection  40 mg Subcutaneous Q24H  . lamoTRIgine  100 mg Oral QHS  . modafinil  200 mg Oral Daily  . polyethylene glycol  17 g Oral Daily  . saccharomyces boulardii  250 mg Oral BID  . vancomycin  125 mg Oral 4 times per day   Continuous Infusions: . dextrose 5 % and 0.45% NaCl 75 mL/hr at 12/03/15 0501       Time spent: 22 minutes   Montie Swiderski, MD, FACP, FHM.  [facility] Pager [phone]  If 7PM-7AM, please contact night-coverage [email or web address] Password [email or web address] 12/03/2015, 10:18 AM    LOS: 2 days

## 2015-12-04 DIAGNOSIS — A047 Enterocolitis due to Clostridium difficile: Secondary | ICD-10-CM

## 2015-12-04 DIAGNOSIS — N179 Acute kidney failure, unspecified: Secondary | ICD-10-CM

## 2015-12-04 DIAGNOSIS — B9620 Unspecified Escherichia coli [E. coli] as the cause of diseases classified elsewhere: Secondary | ICD-10-CM

## 2015-12-04 MED ORDER — HALOPERIDOL LACTATE 5 MG/ML IJ SOLN
1.0000 mg | INTRAMUSCULAR | Status: DC | PRN
  Administered 2015-12-04: 1 mg via INTRAVENOUS
  Filled 2015-12-04 (×2): qty 1

## 2015-12-04 MED ORDER — CLONAZEPAM 0.5 MG PO TABS
0.5000 mg | ORAL_TABLET | Freq: Every evening | ORAL | Status: DC | PRN
  Administered 2015-12-04: 0.5 mg via ORAL
  Filled 2015-12-04: qty 1

## 2015-12-04 MED ORDER — HALOPERIDOL LACTATE 5 MG/ML IJ SOLN
2.0000 mg | Freq: Once | INTRAMUSCULAR | Status: AC
  Administered 2015-12-04: 2 mg via INTRAVENOUS

## 2015-12-04 MED ORDER — QUETIAPINE FUMARATE 25 MG PO TABS
50.0000 mg | ORAL_TABLET | Freq: Every evening | ORAL | Status: DC | PRN
Start: 2015-12-04 — End: 2015-12-05
  Administered 2015-12-04: 50 mg via ORAL
  Filled 2015-12-04: qty 2

## 2015-12-04 MED ORDER — NITROFURANTOIN MONOHYD MACRO 100 MG PO CAPS
100.0000 mg | ORAL_CAPSULE | Freq: Two times a day (BID) | ORAL | Status: DC
  Administered 2015-12-04 – 2015-12-05 (×3): 100 mg via ORAL
  Filled 2015-12-04 (×4): qty 1
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
## 2015-12-04 NOTE — Telephone Encounter (Signed)
I spoke to the patient daughter's [name] in re the [employer] study. I asked her about the patient, and she stated that her mother was back to the hospital since [id number]. I also ask her about the study medication, Baclofen ER, and she stated that she had the bottles and that she would drop them off at our site on [id number]. I asked her if I could pick them up at the hospital, but she declined the offer, and stated that she would bring them on [id number]. I thanked her for her time.

## 2015-12-04 NOTE — Progress Notes (Signed)
CSW presented current bed offers to pt's dtr, [name] [phone], who reports she is considering [facility], [place], and [facility] and will update CSW later today with choice.   Wandra Feinstein, MSW, LCSW

## 2015-12-04 NOTE — Progress Notes (Signed)
Pt very impulsive this am, very unsteady and confused. Made Dr Olevia Bowens aware. Charge nurse aware.
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
## 2015-12-04 NOTE — Progress Notes (Signed)
Occupational Therapy Treatment Patient Details Name: Wanda [name] MRN: [id number] DOB: [birth year] Today's Date: 12/04/2015    History of present illness 64 year old female with history of multiple sclerosis, HTN, HLD, anxiety, depression, anemia, presented to [facility] ED on 11/30/15 due to increased confusion, diarrhea and UTI.   OT comments  Pt continues to demonstrate impaired cognition and safety awareness with poor balance and high fall risk. SNF remains the optimal discharge plan for pt.  Follow Up Recommendations  SNF;Supervision/Assistance - 24 hour    Equipment Recommendations  None recommended by OT    Recommendations for Other Services      Precautions / Restrictions Precautions Precautions: Fall       Mobility Bed Mobility Overal bed mobility: Needs Assistance Bed Mobility: Supine to Sit;Sit to Supine     Supine to sit: Supervision Sit to supine: Supervision   General bed mobility comments: impulsive  Transfers         Stand pivot transfers: Min assist       General transfer comment: pt very unsteady, inefficient--makes a 180 degree turn when approaching sitting surface    Balance             Standing balance-Leahy Scale: Poor                     ADL Overall ADL's : Needs assistance/impaired     Grooming: Wash/dry hands;Sitting;Set up               Lower Body Dressing: Minimal assistance;Sit to/from stand   Toilet Transfer: Minimal assistance;Stand-pivot;BSC   Toileting- Clothing Manipulation and Hygiene: Minimal assistance;Sit to/from stand Toileting - Clothing Manipulation Details (indicate cue type and reason): performed pericare in sitting, assist to manage gown              Vision                     Perception     Praxis      Cognition   Behavior During Therapy: Impulsive Overall Cognitive Status: Impaired/Different from baseline Area of Impairment:  Orientation;Attention;Memory;Safety/judgement;Problem solving Orientation Level: Disoriented to;Time;Place Current Attention Level: Sustained Memory: Decreased short-term memory  Following Commands: Follows one step commands with increased time Safety/Judgement: Decreased awareness of safety;Decreased awareness of deficits   Problem Solving: Difficulty sequencing;Requires verbal cues General Comments: thought she was at her apartment, did not know how to use call button    Extremity/Trunk Assessment               Exercises     Shoulder Instructions       General Comments      Pertinent Vitals/ Pain       Pain Assessment: No/denies pain  Home Living                                          Prior Functioning/Environment              Frequency Min 2X/week     Progress Toward Goals  OT Goals(current goals can now be found in the care plan section)  Progress towards OT goals: Progressing toward goals  Acute Rehab OT Goals Patient Stated Goal: To go to rehab  Plan Discharge plan remains appropriate    Co-evaluation                 End of  Session Equipment Utilized During Treatment: Gait belt   Activity Tolerance Patient tolerated treatment well   Patient Left in bed;with call bell/phone within reach;with bed alarm set   Nurse Communication          Time: [id number] OT Time Calculation (min): 23 min  Charges: OT General Charges $OT Visit: 1 Procedure OT Treatments $Self Care/Home Management : 23-37 mins  Malka So 12/04/2015, 3:54 PM  [phone]

## 2015-12-04 NOTE — Progress Notes (Addendum)
[facility] PROGRESS NOTE    Progress Note   Wanda [name] [id number] DOB: June 26, 1951 DOA: 11/30/2015 PCP: Harrison Mons, PA-C   Brief Narrative:   Wanda [name] is an 65 y.o. female with history of multiple sclerosis (follows with Dr. Krista Blue at [facility]), HTN, HLD, anxiety, depression, anemia, presented to [facility] ED on 11/30/15 due to increased confusion. She was hospitalized on 11/11/15 for similar reasons. She was started on IV Rocephin empirically for presumed UTI. CT head negative for acute findings. Mental status changes were attributed mostly to polypharmacy and confusion seems to have resolved.   Assessment/Plan:   Acute encephalopathy: Workup is likely due to polypharmacy secondary to UTI. Continue Lamictal. It was discussed with neurology recommended to continue Lamictal, and trazodone when necessary for sleep. Continue Provigil. Encephalopathy seems to have resolved.  Escherichia coli UTI: Continue IV Rocephin, changed to [place].  C. difficile colitis: She was started on oral vancomycin and continuing probiotics.  Essential  Hypertension: Continues to improve slowly on no antihypertensive medications at home.  Multiple sclerosis ([place]) It was discussed with neurology recommended to hold to Tysabri 2 to UTI. Can be re-started as an outpatient.  AKI: Resolved with IV fluid hydration.    DVT Prophylaxis - Lovenox ordered.  Family Communication: none Disposition Plan: Home 1-2 days Code Status:     Code Status Orders        Start     Ordered   11/30/15 2331  Full code   Continuous     11/30/15 2330    Code Status History    Date Active Date Inactive Code Status Order ID Comments User Context   11/10/2015  9:29 PM 11/14/2015  6:12 PM Full Code [id number]  Vianne Bulls, MD ED   10/04/2015  4:25 PM 10/08/2015  4:31 PM Full Code [id number]  Oswald Hillock, MD Inpatient   09/16/2015  4:57 PM 09/18/2015  8:46 PM Full Code [id number]  Elmarie Shiley, MD Inpatient   07/29/2015  2:14 PM 07/30/2015  9:48 PM Full Code [id number]  Veatrice Bourbon, MD Inpatient   03/12/2015  1:40 PM 03/13/2015 10:37 PM Full Code [id number]  Juluis Mire, MD ED        IV Access:    Peripheral IV   Procedures and diagnostic studies:   No results found.   Medical Consultants:    None.  Anti-Infectives:   Anti-infectives    Start     Dose/Rate Route Frequency Ordered Stop   12/03/15 1200  vancomycin (VANCOCIN) 50 mg/mL oral solution 125 mg     125 mg Oral 4 times per day 12/03/15 1018 12/17/15 1159   12/01/15 2000  cefTRIAXone (ROCEPHIN) 1 g in dextrose 5 % 50 mL IVPB     1 g 100 mL/hr over 30 Minutes Intravenous Every 24 hours 12/01/15 0201     11/30/15 2045  cefTRIAXone (ROCEPHIN) 1 g in dextrose 5 % 50 mL IVPB     1 g 100 mL/hr over 30 Minutes Intravenous  Once 11/30/15 2033 11/30/15 2213      Subjective:    Wanda [name] feels back to baseline no new complaints.  Objective:    Filed Vitals:   12/03/15 1321 12/03/15 1623 12/03/15 2132 12/04/15 0622  BP: 155/93 146/82 146/89 136/96  Pulse: 99 91 94 91  Temp: 98 F (36.7 C) 98.1 F (36.7 C) 98.4 F (36.9 C) 98 F (36.7 C)  TempSrc: Oral Oral Oral Oral  Resp:  18 16 20 18   Height:      Weight:      SpO2: 100% 100%  99%   No intake or output data in the 24 hours ending 12/04/15 1136 Filed Weights   11/30/15 2257  Weight: 50.2 kg (110 lb 10.7 oz)    Exam: Gen:  NAD Cardiovascular:  RRR, No M/R/G Chest and lungs:   CTAB Abdomen:  Abdomen soft, NT/ND, + BS Extremities:  No edema.   Data Reviewed:    Labs: Basic Metabolic Panel:  Recent Labs Lab 11/30/15 1823 12/01/15 0709 12/02/15 0729 12/03/15 0806  NA 147* 143 144 142  K 4.0 3.2* 4.7 3.9  CL 113* 112* 112* 108  CO2 18* 22 20* 23  GLUCOSE 97 133* 109* 110*  BUN 12 7 5* <5*  CREATININE 1.03* 0.72 0.92 0.88  CALCIUM 9.3 8.9 9.3 9.6   GFR Estimated Creatinine Clearance: 45.8 mL/min (by C-G  formula based on Cr of 0.88). Liver Function Tests:  Recent Labs Lab 11/30/15 1823  AST 15  ALT 12*  ALKPHOS 63  BILITOT 0.7  PROT 6.4*  ALBUMIN 3.4*   No results for input(s): LIPASE, AMYLASE in the last 168 hours.  Recent Labs Lab 11/30/15 2325  AMMONIA 48*   Coagulation profile No results for input(s): INR, PROTIME in the last 168 hours.  CBC:  Recent Labs Lab 11/30/15 1823 12/01/15 0709 12/02/15 0729 12/03/15 0806  WBC 6.2 13.6* 7.5 7.9  HGB 11.8* 11.5* 12.2 12.8  HCT 37.4 36.2 36.3 40.0  MCV 84.8 83.4 81.6 83.2  PLT 246 248 291 325   Cardiac Enzymes: No results for input(s): CKTOTAL, CKMB, CKMBINDEX, TROPONINI in the last 168 hours. BNP (last 3 results) No results for input(s): PROBNP in the last 8760 hours. CBG:  Recent Labs Lab 11/30/15 1936 12/03/15 1622 12/03/15 1949  GLUCAP 86 88 148*   D-Dimer: No results for input(s): DDIMER in the last 72 hours. Hgb A1c: No results for input(s): HGBA1C in the last 72 hours. Lipid Profile: No results for input(s): CHOL, HDL, LDLCALC, TRIG, CHOLHDL, LDLDIRECT in the last 72 hours. Thyroid function studies: No results for input(s): TSH, T4TOTAL, T3FREE, THYROIDAB in the last 72 hours.  Invalid input(s): FREET3 Anemia work up: No results for input(s): VITAMINB12, FOLATE, FERRITIN, TIBC, IRON, RETICCTPCT in the last 72 hours. Sepsis Labs:  Recent Labs Lab 11/30/15 1823 11/30/15 1831 12/01/15 0709 12/02/15 0729 12/03/15 0806  WBC 6.2  --  13.6* 7.5 7.9  LATICACIDVEN  --  1.89  --   --   --    Microbiology Recent Results (from the past 240 hour(s))  Urine culture     Status: None   Collection Time: 11/30/15  7:34 PM  Result Value Ref Range Status   Specimen Description URINE, CLEAN CATCH  Final   Special Requests NONE  Final   Culture >=100,000 COLONIES/mL ESCHERICHIA COLI  Final   Report Status 12/03/2015 FINAL  Final   Organism ID, Bacteria ESCHERICHIA COLI  Final      Susceptibility    Escherichia coli - MIC*    AMPICILLIN >=32 RESISTANT Resistant     CEFAZOLIN <=4 SENSITIVE Sensitive     CEFTRIAXONE <=1 SENSITIVE Sensitive     CIPROFLOXACIN <=0.25 SENSITIVE Sensitive     GENTAMICIN <=1 SENSITIVE Sensitive     IMIPENEM <=0.25 SENSITIVE Sensitive     NITROFURANTOIN <=16 SENSITIVE Sensitive     TRIMETH/SULFA >=320 RESISTANT Resistant     AMPICILLIN/SULBACTAM 16 INTERMEDIATE  Intermediate     PIP/TAZO <=4 SENSITIVE Sensitive     * >=100,000 COLONIES/mL ESCHERICHIA COLI  C difficile quick scan w PCR reflex     Status: Abnormal   Collection Time: 12/03/15  6:35 AM  Result Value Ref Range Status   C Diff antigen POSITIVE (A) NEGATIVE Final   C Diff toxin POSITIVE (A) NEGATIVE Final   C Diff interpretation Positive for toxigenic C. difficile  Final    Comment: CRITICAL RESULT CALLED TO, READ BACK BY AND VERIFIED WITH: SPOKE TO RN [email or web address] @ 08:31 ON 12/03/2015 BY [email or web address]      Medications:   . cefTRIAXone (ROCEPHIN) IVPB 1 gram/50 mL D5W  1 g Intravenous Q24H  . lamoTRIgine  100 mg Oral QHS  . modafinil  200 mg Oral Daily  . saccharomyces boulardii  250 mg Oral BID  . vancomycin  125 mg Oral 4 times per day   Continuous Infusions:   Time spent: 25 min   LOS: 3 days   Charlynne Cousins  [facility] Pager [phone]  *Please refer to [email or web address], password [email or web address] to get updated schedule on who will round on this patient, as hospitalists switch teams weekly. If 7PM-7AM, please contact night-coverage at [email or web address], password [email or web address] for any overnight needs.  12/04/2015, 11:36 AM

## 2015-12-04 NOTE — Care Management Important Message (Signed)
Important Message  Patient Details  Name: TYSHELL BREITLING MRN: [id number] Date of Birth: 02/18/1951   Medicare Important Message Given:  Yes    Carles Collet, RN 12/04/2015, 12:13 [place] Message  Patient Details  Name: NAYLENE GARMENDIA MRN: [id number] Date of Birth: 1951-05-03   Medicare Important Message Given:  Yes    Carles Collet, RN 12/04/2015, 12:13 PM

## 2015-12-04 NOTE — Progress Notes (Addendum)
Nurse in room with pt. Pt very confused and impulsive. Pt attempting multiple times to get oob & is very unsteady. Pt screaming and using profanity towards nursing staff. When assisting pt back to bed, pt becomes more agitated. Pt stated, "You all need to get out of my house!". Pt having hallucinations. Attempted to reorient pt without success. Music therapist in room. Dr also made aware. 1mg  Haldol IV ordered. Administering Haldol now. Will continue to monitor pt frequently. Bed remains in lowest position and call bell is within reach. Bed alarm is on.

## 2015-12-05 DIAGNOSIS — I10 Essential (primary) hypertension: Secondary | ICD-10-CM | POA: Diagnosis not present

## 2015-12-05 DIAGNOSIS — N390 Urinary tract infection, site not specified: Secondary | ICD-10-CM | POA: Diagnosis not present

## 2015-12-05 DIAGNOSIS — F419 Anxiety disorder, unspecified: Secondary | ICD-10-CM | POA: Diagnosis not present

## 2015-12-05 DIAGNOSIS — R5383 Other fatigue: Secondary | ICD-10-CM | POA: Diagnosis not present

## 2015-12-05 DIAGNOSIS — F411 Generalized anxiety disorder: Secondary | ICD-10-CM | POA: Diagnosis not present

## 2015-12-05 DIAGNOSIS — A047 Enterocolitis due to Clostridium difficile: Secondary | ICD-10-CM | POA: Diagnosis not present

## 2015-12-05 DIAGNOSIS — R5381 Other malaise: Secondary | ICD-10-CM | POA: Diagnosis not present

## 2015-12-05 DIAGNOSIS — R3911 Hesitancy of micturition: Secondary | ICD-10-CM | POA: Diagnosis not present

## 2015-12-05 DIAGNOSIS — M6289 Other specified disorders of muscle: Secondary | ICD-10-CM | POA: Diagnosis not present

## 2015-12-05 DIAGNOSIS — M6281 Muscle weakness (generalized): Secondary | ICD-10-CM | POA: Diagnosis not present

## 2015-12-05 DIAGNOSIS — N179 Acute kidney failure, unspecified: Secondary | ICD-10-CM | POA: Diagnosis not present

## 2015-12-05 DIAGNOSIS — G40909 Epilepsy, unspecified, not intractable, without status epilepticus: Secondary | ICD-10-CM | POA: Diagnosis not present

## 2015-12-05 DIAGNOSIS — R52 Pain, unspecified: Secondary | ICD-10-CM | POA: Diagnosis not present

## 2015-12-05 DIAGNOSIS — G35 Multiple sclerosis: Secondary | ICD-10-CM | POA: Diagnosis not present

## 2015-12-05 DIAGNOSIS — M67432 Ganglion, left wrist: Secondary | ICD-10-CM | POA: Diagnosis not present

## 2015-12-05 DIAGNOSIS — M25819 Other specified joint disorders, unspecified shoulder: Secondary | ICD-10-CM | POA: Diagnosis not present

## 2015-12-05 DIAGNOSIS — G9340 Encephalopathy, unspecified: Secondary | ICD-10-CM | POA: Diagnosis not present

## 2015-12-05 DIAGNOSIS — R2689 Other abnormalities of gait and mobility: Secondary | ICD-10-CM | POA: Diagnosis not present

## 2015-12-05 DIAGNOSIS — G4700 Insomnia, unspecified: Secondary | ICD-10-CM | POA: Diagnosis not present

## 2015-12-05 DIAGNOSIS — M25811 Other specified joint disorders, right shoulder: Secondary | ICD-10-CM | POA: Diagnosis not present

## 2015-12-05 DIAGNOSIS — R269 Unspecified abnormalities of gait and mobility: Secondary | ICD-10-CM | POA: Diagnosis not present

## 2015-12-05 DIAGNOSIS — M545 Low back pain: Secondary | ICD-10-CM | POA: Diagnosis not present

## 2015-12-05 DIAGNOSIS — M25512 Pain in left shoulder: Secondary | ICD-10-CM | POA: Diagnosis not present

## 2015-12-05 DIAGNOSIS — R4182 Altered mental status, unspecified: Secondary | ICD-10-CM | POA: Diagnosis not present

## 2015-12-05 DIAGNOSIS — M25519 Pain in unspecified shoulder: Secondary | ICD-10-CM | POA: Diagnosis not present

## 2015-12-05 MED ORDER — LAMOTRIGINE 100 MG PO TABS: 100.0000 mg | ORAL_TABLET | Freq: Every day | ORAL | Status: DC

## 2015-12-05 MED ORDER — NITROFURANTOIN MONOHYD MACRO 100 MG PO CAPS: 100.0000 mg | ORAL_CAPSULE | Freq: Two times a day (BID) | ORAL | Status: DC

## 2015-12-05 MED ORDER — NATALIZUMAB 300 MG/15ML IV CONC: 300.0000 mg | INTRAVENOUS | Status: DC

## 2015-12-05 MED ORDER — VANCOMYCIN 50 MG/ML ORAL SOLUTION: 125.0000 mg | Freq: Four times a day (QID) | ORAL | Status: DC

## 2015-12-05 MED ORDER — HYDROCODONE-ACETAMINOPHEN 5-325 MG PO TABS: 1.0000 | ORAL_TABLET | ORAL | Status: DC | PRN

## 2015-12-05 MED ORDER — SACCHAROMYCES BOULARDII 250 MG PO CAPS: 250.0000 mg | ORAL_CAPSULE | Freq: Two times a day (BID) | ORAL | Status: DC

## 2015-12-05 NOTE — Progress Notes (Signed)
Report given to Nurse that will be receiving pt at [facility]. All questions answered. Pt will be transferred by [facility]. IV Removed.

## 2015-12-05 NOTE — Discharge Summary (Signed)
Physician Discharge Summary  Wanda [name] [id number] DOB: [birth year] DOA: 11/30/2015  PCP: Harrison Mons, PA-C  Admit date: 11/30/2015 Discharge date: 12/05/2015  Time spent: 35 minutes  Recommendations for Outpatient Follow-up:  1. Skilled nursing facility, will follow-up with Dr. Krista Blue in 2 weeks.   Discharge Diagnoses:  Principal Problem:   Acute encephalopathy Active Problems:   Hypertension   Multiple sclerosis ([place])   UTI (lower urinary tract infection)   Polypharmacy   E-coli UTI   C. difficile diarrhea   AKI (acute kidney injury) ([place])   Discharge Condition: stable  Diet recommendation: regular  Filed Weights   11/30/15 2257  Weight: 50.2 kg (110 lb 10.7 oz)    History of present illness:  65 year old with past medical history of MS hypertension who comes in for confusion that started today of admission.  Hospital Course:  Acute encephalopathy likely multifactorial due to polypharmacy any UTI: CT scan of the head was negative for bleed. She was started on IV Rocephin on admission urine culture grew Escherichia coli sensitive to Macrobid, antibiotic regimen with Deescalated she will she will continue for 4 additional days. Neurology recommended to increase her mental and continue trazodone at bedtime.  Escherichia coli UTI: She was started on IV Rocephin urine cultures show Escherichia coli sensitive to Macrobid she will continue this for 4 additional days.  C. difficile colitis: She started having diarrhea after being on Rocephin for C. difficile PCR was checked which was positive. She was started on oral Vanco she will continue for 1 week after she is finished with her Macrobid.  Essential hypertension: No changes were made to her medication.  Multiple sclerosis: It was discussed with neurology recommended to hold her medication for 2 weeks and to be resume then. He relates he went to use an outpatient.  Acute kidney injury: Likely. No resolved with IV  fluid hydration.   Procedures:  CT head  Chest x-ray  Consultations:  none  Discharge Exam: Filed Vitals:   12/05/15 0535 12/05/15 1104  BP: 149/86 139/90  Pulse: 95 105  Temp: 97.5 F (36.4 C) 98 F (36.7 C)  Resp: 16 20    General: A&O x3 Cardiovascular: RRR Respiratory: good air movement CTA B/L  Discharge Instructions   Discharge Instructions    Diet - low sodium heart healthy    Complete by:  As directed      Increase activity slowly    Complete by:  As directed           Current Discharge Medication List    START taking these medications   Details  nitrofurantoin, macrocrystal-monohydrate, (MACROBID) 100 MG capsule Take 1 capsule (100 mg total) by mouth every 12 (twelve) hours. Qty: 6 capsule, Refills: 0    saccharomyces boulardii (FLORASTOR) 250 MG capsule Take 1 capsule (250 mg total) by mouth 2 (two) times daily. Qty: 30 capsule, Refills: 3    vancomycin (VANCOCIN) 50 mg/mL oral solution Take 2.5 mLs (125 mg total) by mouth every 6 (six) hours. Qty: 120 mL, Refills: 0      CONTINUE these medications which have CHANGED   Details  HYDROcodone-acetaminophen (NORCO/VICODIN) 5-325 MG tablet Take 1 tablet by mouth every 4 (four) hours as needed for moderate pain. Qty: 30 tablet, Refills: 0    lamoTRIgine (LAMICTAL) 100 MG tablet Take 1 tablet (100 mg total) by mouth at bedtime. Qty: 30 tablet, Refills: 0    natalizumab (TYSABRI) 300 MG/15ML injection Inject 15 mLs (300 mg total) into the  vein every 30 (thirty) days. Qty: 15 mL      CONTINUE these medications which have NOT CHANGED   Details  Cholecalciferol (VITAMIN D3) 2000 UNITS capsule Take 2,000 Units by mouth daily.    clonazePAM (KLONOPIN) 0.5 MG tablet Take 1 tablet (0.5 mg total) by mouth at bedtime as needed. For sleep Qty: 15 tablet, Refills: 0    Investigational - Study Medication Take 80 mg by mouth at bedtime. Study through [facility] Dr. Krista Blue ID # [phone] started early  2016    lidocaine (LIDODERM) 5 % Place 1 patch onto the skin daily. Remove & Discard patch within 12 hours or as directed by MD Qty: 30 patch, Refills: 0    modafinil (PROVIGIL) 200 MG tablet Take 1 tablet (200 mg total) by mouth daily. Qty: 30 tablet, Refills: 5    Omega-3 Fatty Acids (OMEGA 3 500 PO) Take 500 mg by mouth 2 (two) times daily.     OVER THE COUNTER MEDICATION Take 1 capsule by mouth daily. Neuro optimizer - dietary supplement    prednisoLONE acetate (PRED FORTE) 1 % ophthalmic suspension Place 1 drop into the left eye 4 (four) times daily. For one more week Qty: 5 mL, Refills: 0    traZODone (DESYREL) 50 MG tablet Take 50 mg by mouth at bedtime as needed for sleep.  Refills: 0    vitamin B-12 (CYANOCOBALAMIN) 1000 MCG tablet Take 1,000 mcg by mouth daily.    zolpidem (AMBIEN) 5 MG tablet Take 5 mg by mouth at bedtime as needed for sleep.    senna-docusate (SENOKOT-S) 8.6-50 MG tablet Take 1 tablet by mouth at bedtime as needed for mild constipation or moderate constipation.      STOP taking these medications     cyclobenzaprine (FLEXERIL) 10 MG tablet      naproxen (NAPROSYN) 500 MG tablet        Allergies  Allergen Reactions  . Baclofen Other (See Comments)    Confusion, muscle weakness, lethargy per daughter   Follow-up Information    Follow up with [facility].   Why:  [facility] will follow you after you are discharged from SNF   Contact information:   AFTER DISCHARGE FROM SNF       The results of significant diagnostics from this hospitalization (including imaging, microbiology, ancillary and laboratory) are listed below for reference.    Significant Diagnostic Studies: Dg Chest 2 View  11/25/2015  CLINICAL DATA:  Chest pain with generalized weakness and diarrhea. EXAM: CHEST  2 VIEW COMPARISON:  11/19/2015 FINDINGS: The lungs are clear wiithout focal pneumonia, edema, pneumothorax or pleural effusion. Right parahilar  nodularity related to anterior first rib and , is seen on multiple prior studies dating back to 03/12/2015 and 09/13/2009. The cardio pericardial silhouette is enlarged. The visualized bony structures of the thorax are intact. Bilateral cervical ribs are again noted. Telemetry leads overlie the chest. IMPRESSION: No active cardiopulmonary disease. Electronically Signed   By: Misty Stanley M.D.   On: 11/25/2015 17:12   Dg Chest 2 View  11/10/2015  CLINICAL DATA:  Altered mental status, history of MS EXAM: CHEST  2 VIEW COMPARISON:  10/04/2015 FINDINGS: Lungs are clear.  No pleural effusion or pneumothorax. The heart is top-normal in size. Mild degenerative changes of the visualized thoracolumbar spine. Cholecystectomy clips. IMPRESSION: No evidence of acute cardiopulmonary disease. Electronically Signed   By: Julian Hy M.D.   On: 11/10/2015 18:03   Dg Ribs Unilateral  W/chest Left  11/19/2015  CLINICAL DATA:  65 year old female with fall and left-sided chest pain. EXAM: LEFT RIBS AND CHEST - 3+ VIEW COMPARISON:  Radiograph dated 11/10/2015 FINDINGS: No fracture or other bone lesions are seen involving the ribs. There is no evidence of pneumothorax or pleural effusion. Both lungs are clear. Heart size and mediastinal contours are within normal limits. IMPRESSION: No rib fracture or pneumothorax. Electronically Signed   By: Anner Crete M.D.   On: 11/19/2015 20:07   Dg Thoracic Spine 2 View  11/19/2015  CLINICAL DATA:  Shoulder and left side pain since a fall when the patient tripped over and vacuum cleaner 2 weeks ago. Initial encounter. EXAM: THORACIC SPINE 2 VIEWS COMPARISON:  PA and lateral chest 11/10/2015. FINDINGS: No fracture or malalignment is identified. Multilevel thoracic spondylosis is seen. Paraspinous structures are unremarkable. IMPRESSION: No acute abnormality. Electronically Signed   By: Inge Rise M.D.   On: 11/19/2015 20:07   Dg Lumbar Spine Complete  11/25/2015  CLINICAL  DATA:  Fall, lumbar pain EXAM: LUMBAR SPINE - COMPLETE 4+ VIEW COMPARISON:  11/19/2015 FINDINGS: Five views of lumbar spine submitted. No acute fracture or subluxation. Mild anterior spurring noted upper endplate of L3 and L4 vertebral body. Moderate disc space flattening at L5-S1 level. Calcified fibroid within right pelvis measures 3.7 cm. IMPRESSION: No acute fracture or subluxation. Degenerative changes as described above. Calcified uterine fibroid within pelvis measures 3.7 cm. Electronically Signed   By: Lahoma Crocker M.D.   On: 11/25/2015 17:13   Dg Lumbar Spine Complete  11/19/2015  CLINICAL DATA:  Left back pain EXAM: LUMBAR SPINE - COMPLETE 4+ VIEW COMPARISON:  01/30/2009 FINDINGS: No vertebral compression. Anatomic alignment. Disc space narrowing L2-3, L3-4, and L5-S1 is noted. This has progressed at L5-S1. No definite acute fracture. Facet arthropathy throughout the mid and lower lumbar spine. Calcified fibroids project over the pelvis. IMPRESSION: No acute bony pathology.  Chronic changes. Electronically Signed   By: Marybelle Killings M.D.   On: 11/19/2015 20:11   Ct Head Wo Contrast  11/30/2015  CLINICAL DATA:  Altered mental status.  Confusion. EXAM: CT HEAD WITHOUT CONTRAST TECHNIQUE: Contiguous axial images were obtained from the base of the skull through the vertex without intravenous contrast. COMPARISON:  Head CT 11/10/2015, brain MRI 11/11/2015 FINDINGS: No intracranial hemorrhage, mass effect, or midline shift. Generalized atrophy and periventricular white matter change, stable from prior exam. No hydrocephalus. The basilar cisterns are patent. No evidence of territorial infarct. No intracranial fluid collection. Calvarium is intact. Included paranasal sinuses and mastoid air cells are well aerated. IMPRESSION: Stable atrophy and periventricular white matter change. No acute intracranial abnormality. Electronically Signed   By: Jeb Levering M.D.   On: 11/30/2015 19:49   Ct Head Wo  Contrast  11/10/2015  CLINICAL DATA:  Confusion EXAM: CT HEAD WITHOUT CONTRAST TECHNIQUE: Contiguous axial images were obtained from the base of the skull through the vertex without intravenous contrast. COMPARISON:  None. FINDINGS: There is no evidence of mass effect, midline shift, or extra-axial fluid collections. There is no evidence of a space-occupying lesion or intracranial hemorrhage. There is no evidence of a cortical-based area of acute infarction. There is generalized cerebral atrophy. There is periventricular white matter low attenuation likely secondary to microangiopathy. The ventricles and sulci are appropriate for the patient's age. The basal cisterns are patent. Visualized portions of the orbits are unremarkable. The visualized portions of the paranasal sinuses and mastoid air cells are unremarkable. Cerebrovascular atherosclerotic calcifications  are noted. The osseous structures are unremarkable. IMPRESSION: 1. No acute intracranial pathology. 2. Chronic microvascular disease and cerebral atrophy. Electronically Signed   By: Kathreen Devoid   On: 11/10/2015 17:51   Ct Cervical Spine Wo Contrast  11/19/2015  CLINICAL DATA:  Back pain EXAM: CT CERVICAL SPINE WITHOUT CONTRAST TECHNIQUE: Multidetector CT imaging of the cervical spine was performed without intravenous contrast. Multiplanar CT image reconstructions were also generated. COMPARISON:  None. FINDINGS: No acute fracture. No dislocation. There is moderate narrowing of the C5-6, C6-7, and C7-T1 discs. Bilateral foraminal narrowing occurs at these levels secondary to uncovertebral osteophytes. The thyroid gland is heterogeneous. Areas of low density are present in the left lobe with calcification. Nodules are not excluded. Lung apices are grossly clear. IMPRESSION: No acute bony injury in the cervical spine Degenerative changes as described Possible left-sided thyroid nodules.  Ultrasound may be helpful. Electronically Signed   By: Marybelle Killings  M.D.   On: 11/19/2015 19:42   Mr Brain Wo Contrast  11/11/2015  CLINICAL DATA:  Altered mental status, discharged from rehabilitation less than 10 days ago. Acute encephalopathy, similar prior episodes. History of multiple sclerosis. EXAM: MRI HEAD WITHOUT CONTRAST TECHNIQUE: Multiplanar, multiecho pulse sequences of the brain and surrounding structures were obtained without intravenous contrast. COMPARISON:  CT head September 08, 2016 and MRI head October 04, 2015 FINDINGS: Moderate ventriculomegaly on the basis of global parenchymal brain volume loss, advanced for age of stable from prior imaging. Patchy pontine and confluent supratentorial white matter T2 hyperintensities in addition to cystic periventricular lesions with low T1 signal consistent with black holes of demyelination. Stable lesion RIGHT basal ganglia. Subcentimeter LEFT subependymal lesion with crescentic susceptibility artifact is unchanged, nonspecific. Few additional punctate foci of susceptibility artifact, nonspecific. No abnormal extra-axial fluid collections. Normal major intracranial vascular flow voids present at the skull base. Ocular globes and orbital contents appear normal though, not tailored for assessment of optic neuritis. Paranasal sinuses and mastoid air cells are well aerated. No abnormal sellar expansion. No cerebellar tonsillar ectopia. No abnormal calvarial bone marrow signal. No abnormal mass lesions, mass effect. No reduced diffusion to suggest acute ischemia nor hyperacute demyelination. IMPRESSION: No acute intracranial process. Stable appearance of the brain: Advanced white matter changes compatible with chronic demyelination and moderate global parenchymal brain volume loss, advanced for age. Stable subcentimeter hemorrhagic periatrial subependymal nodule. Electronically Signed   By: Elon Alas M.D.   On: 11/11/2015 01:46   Dg Chest Port 1 View  11/30/2015  CLINICAL DATA:  Altered mental status EXAM: PORTABLE  CHEST 1 VIEW COMPARISON:  11/25/2015 FINDINGS: Cardiac shadow is stable. The lungs are well aerated bilaterally. No focal infiltrate or sizable effusion is seen. No bony abnormality is noted. IMPRESSION: No active disease. Electronically Signed   By: Inez Catalina M.D.   On: 11/30/2015 18:43   Dg Shoulder Left  11/19/2015  CLINICAL DATA:  65 year old female with fall and left shoulder pain EXAM: LEFT SHOULDER - 2+ VIEW COMPARISON:  Chest radiograph dated 11/19/2015 FINDINGS: There is no acute fracture or dislocation of the left shoulder. There is mild degenerative changes of the inferior aspect of the glenoid rim. The soft tissues are grossly unremarkable. IMPRESSION: No acute fracture or dislocation. Electronically Signed   By: Anner Crete M.D.   On: 11/19/2015 20:06    Microbiology: Recent Results (from the past 240 hour(s))  Urine culture     Status: None   Collection Time: 11/30/15  7:34 PM  Result  Value Ref Range Status   Specimen Description URINE, CLEAN CATCH  Final   Special Requests NONE  Final   Culture >=100,000 COLONIES/mL ESCHERICHIA COLI  Final   Report Status 12/03/2015 FINAL  Final   Organism ID, Bacteria ESCHERICHIA COLI  Final      Susceptibility   Escherichia coli - MIC*    AMPICILLIN >=32 RESISTANT Resistant     CEFAZOLIN <=4 SENSITIVE Sensitive     CEFTRIAXONE <=1 SENSITIVE Sensitive     CIPROFLOXACIN <=0.25 SENSITIVE Sensitive     GENTAMICIN <=1 SENSITIVE Sensitive     IMIPENEM <=0.25 SENSITIVE Sensitive     NITROFURANTOIN <=16 SENSITIVE Sensitive     TRIMETH/SULFA >=320 RESISTANT Resistant     AMPICILLIN/SULBACTAM 16 INTERMEDIATE Intermediate     PIP/TAZO <=4 SENSITIVE Sensitive     * >=100,000 COLONIES/mL ESCHERICHIA COLI  C difficile quick scan w PCR reflex     Status: Abnormal   Collection Time: 12/03/15  6:35 AM  Result Value Ref Range Status   C Diff antigen POSITIVE (A) NEGATIVE Final   C Diff toxin POSITIVE (A) NEGATIVE Final   C Diff interpretation  Positive for toxigenic C. difficile  Final    Comment: CRITICAL RESULT CALLED TO, READ BACK BY AND VERIFIED WITH: SPOKE TO RN [email or web address] @ 08:31 ON 12/03/2015 BY K.[email or web address]      Labs: Basic Metabolic Panel:  Recent Labs Lab 11/30/15 1823 12/01/15 0709 12/02/15 0729 12/03/15 0806  NA 147* 143 144 142  K 4.0 3.2* 4.7 3.9  CL 113* 112* 112* 108  CO2 18* 22 20* 23  GLUCOSE 97 133* 109* 110*  BUN 12 7 5* <5*  CREATININE 1.03* 0.72 0.92 0.88  CALCIUM 9.3 8.9 9.3 9.6   Liver Function Tests:  Recent Labs Lab 11/30/15 1823  AST 15  ALT 12*  ALKPHOS 63  BILITOT 0.7  PROT 6.4*  ALBUMIN 3.4*   No results for input(s): LIPASE, AMYLASE in the last 168 hours.  Recent Labs Lab 11/30/15 2325  AMMONIA 48*   CBC:  Recent Labs Lab 11/30/15 1823 12/01/15 0709 12/02/15 0729 12/03/15 0806  WBC 6.2 13.6* 7.5 7.9  HGB 11.8* 11.5* 12.2 12.8  HCT 37.4 36.2 36.3 40.0  MCV 84.8 83.4 81.6 83.2  PLT 246 248 291 325   Cardiac Enzymes: No results for input(s): CKTOTAL, CKMB, CKMBINDEX, TROPONINI in the last 168 hours. BNP: BNP (last 3 results) No results for input(s): BNP in the last 8760 hours.  ProBNP (last 3 results) No results for input(s): PROBNP in the last 8760 hours.  CBG:  Recent Labs Lab 11/30/15 1936 12/03/15 1622 12/03/15 1949  GLUCAP 86 88 148*       Signed:  Charlynne Cousins MD.  [facility] 12/05/2015, 12:28 PM

## 2015-12-05 NOTE — Progress Notes (Signed)
Patient will DC to: [facility] Anticipated DC date: 12/05/15 Family notified: Daughter Transport by: PTAR 3pm  [place] signing off.  Cedric Fishman, [place] Social Worker [phone]

## 2015-12-05 NOTE — Progress Notes (Signed)
Patient to DC to SNF. After SNF DC, patient will be followed by [facility] program) for Home Health.

## 2015-12-09 ENCOUNTER — Telehealth: Payer: Self-pay | Admitting: Neurology

## 2015-12-09 ENCOUNTER — Telehealth: Payer: Self-pay

## 2015-12-09 DIAGNOSIS — M67432 Ganglion, left wrist: Secondary | ICD-10-CM | POA: Diagnosis not present

## 2015-12-09 DIAGNOSIS — F419 Anxiety disorder, unspecified: Secondary | ICD-10-CM | POA: Diagnosis not present

## 2015-12-09 DIAGNOSIS — R52 Pain, unspecified: Secondary | ICD-10-CM | POA: Diagnosis not present

## 2015-12-09 NOTE — Telephone Encounter (Signed)
Pt's daughter called in and is requesting a second opinion from Dr. Felecia Shelling due to her mother having MS.  Pt would like to see if the referral can be made as soon as possible so that she can take the opening for tomorrow 12/10/15.

## 2015-12-09 NOTE — Telephone Encounter (Signed)
I spoke to the patient's daughter, [name], in re the [employer] study. [name] stated that the patient will be coming tomorrow to get her Tysabri infusion and will stop by [facility] to drop off the study medication.

## 2015-12-09 NOTE — Telephone Encounter (Signed)
Is it ok to set up this appointment with Dr. Felecia Shelling?

## 2015-12-09 NOTE — Telephone Encounter (Signed)
It is ok to let her see Dr. Felecia Shelling.

## 2015-12-10 ENCOUNTER — Ambulatory Visit: Payer: Medicare Other | Admitting: Neurology

## 2015-12-12 DIAGNOSIS — M25519 Pain in unspecified shoulder: Secondary | ICD-10-CM | POA: Diagnosis not present

## 2015-12-12 DIAGNOSIS — R5381 Other malaise: Secondary | ICD-10-CM | POA: Diagnosis not present

## 2015-12-12 DIAGNOSIS — R2689 Other abnormalities of gait and mobility: Secondary | ICD-10-CM | POA: Diagnosis not present

## 2015-12-12 DIAGNOSIS — G35 Multiple sclerosis: Secondary | ICD-10-CM | POA: Diagnosis not present

## 2015-12-12 DIAGNOSIS — M25512 Pain in left shoulder: Secondary | ICD-10-CM | POA: Diagnosis not present

## 2015-12-12 DIAGNOSIS — M6281 Muscle weakness (generalized): Secondary | ICD-10-CM | POA: Diagnosis not present

## 2015-12-12 DIAGNOSIS — A047 Enterocolitis due to Clostridium difficile: Secondary | ICD-10-CM | POA: Diagnosis not present

## 2015-12-12 DIAGNOSIS — I10 Essential (primary) hypertension: Secondary | ICD-10-CM | POA: Diagnosis not present

## 2015-12-13 ENCOUNTER — Ambulatory Visit: Payer: Self-pay | Admitting: Neurology

## 2015-12-16 ENCOUNTER — Telehealth: Payer: Self-pay | Admitting: Neurology

## 2015-12-16 NOTE — Telephone Encounter (Signed)
I have spoken with Wanda [name] and given earlier appt. for mom's 2nd opinion with RAS/fim

## 2015-12-16 NOTE — Telephone Encounter (Signed)
Daughter [name] [phone] called requesting to speak to Faith regarding things that Steele is telling patient and also wants to talk to [facility] about appointment.

## 2015-12-18 ENCOUNTER — Encounter: Payer: Self-pay | Admitting: Neurology

## 2015-12-18 ENCOUNTER — Ambulatory Visit (INDEPENDENT_AMBULATORY_CARE_PROVIDER_SITE_OTHER): Payer: Medicare Other | Admitting: Neurology

## 2015-12-18 VITALS — BP 106/66 | HR 76 | Resp 18 | Ht 60.0 in | Wt 115.4 lb

## 2015-12-18 DIAGNOSIS — R269 Unspecified abnormalities of gait and mobility: Secondary | ICD-10-CM

## 2015-12-18 DIAGNOSIS — G4700 Insomnia, unspecified: Secondary | ICD-10-CM | POA: Diagnosis not present

## 2015-12-18 DIAGNOSIS — M25519 Pain in unspecified shoulder: Secondary | ICD-10-CM | POA: Diagnosis not present

## 2015-12-18 DIAGNOSIS — R2689 Other abnormalities of gait and mobility: Secondary | ICD-10-CM | POA: Diagnosis not present

## 2015-12-18 DIAGNOSIS — M25811 Other specified joint disorders, right shoulder: Secondary | ICD-10-CM | POA: Diagnosis not present

## 2015-12-18 DIAGNOSIS — R5383 Other fatigue: Secondary | ICD-10-CM | POA: Diagnosis not present

## 2015-12-18 DIAGNOSIS — G35 Multiple sclerosis: Secondary | ICD-10-CM | POA: Diagnosis not present

## 2015-12-18 DIAGNOSIS — M25819 Other specified joint disorders, unspecified shoulder: Secondary | ICD-10-CM | POA: Diagnosis not present

## 2015-12-18 DIAGNOSIS — R531 Weakness: Secondary | ICD-10-CM

## 2015-12-18 DIAGNOSIS — R3911 Hesitancy of micturition: Secondary | ICD-10-CM | POA: Insufficient documentation

## 2015-12-18 DIAGNOSIS — M6289 Other specified disorders of muscle: Secondary | ICD-10-CM

## 2015-12-18 DIAGNOSIS — M6281 Muscle weakness (generalized): Secondary | ICD-10-CM | POA: Diagnosis not present

## 2015-12-18 DIAGNOSIS — R5381 Other malaise: Secondary | ICD-10-CM | POA: Diagnosis not present

## 2015-12-18 DIAGNOSIS — G35D Multiple sclerosis, unspecified: Secondary | ICD-10-CM

## 2015-12-18 MED ORDER — BACLOFEN 10 MG PO TABS: 10.0000 mg | ORAL_TABLET | Freq: Three times a day (TID) | ORAL | Status: DC

## 2015-12-18 MED ORDER — TAMSULOSIN HCL 0.4 MG PO CAPS: 0.4000 mg | ORAL_CAPSULE | Freq: Every day | ORAL | Status: DC

## 2015-12-18 NOTE — Progress Notes (Signed)
[facility]  PATIENT: Wanda [name] DOB: December 10, 1950  REFERRING DOCTOR OR PCP:  patient SOURCE: patient and records in EMR, labs/imaging reports, MRI images on PACS  _________________________________   HISTORICAL  CHIEF COMPLAINT:  Chief Complaint  Patient presents with  . Multiple Sclerosis    Wanda [name] is here with her dtr. [name] to transfer care from Dr. Krista Blue to Dr. Felecia Shelling.  Sts. she was dx. with MS in March 2015.  Presenting sx. were left sided weakness, urinary incontinence, spasms in hands.  Sts. dx. confirmed with MRI.  Doesn't think she had an LP.  She was started on Tysabri, which she remains on.  JCV ab last checked 06-12-15 was positive at 0.58.  She was in the Baclofen study for a short time, but became ill (c. diff) and attributes this to the Baclofen, so she stopped it.  She is here   . Gait Disturbance    today to discuss other possible treatment options/ if she should remain on Tysabri.    HISTORY OF PRESENT ILLNESS:  Wanda [name] is a 65 year old woman with a history of multiple sclerosis. She is transferring care from Dr. Krista Blue.  MS History:   In late 2014, she woke up one day unable to get out of bed due to weakness. There is on both sides of her body and she had clumsiness. MRI of the brain showed multiple plaques worrisome for MS. She had a follow-up contrasted MRI of the brain as well as the cervical spine showing multiple lesions, many of which enhance, including some in the infratentorial region. She was started on Tysabri as her first drug due to the aggressiveness of the multiple sclerosis. She has continued to be on Tysabri with monthly infusions. She is JCV antibody low positive with a reading of 0.58, last checked about 6 months ago. She was recently admitted to the hospital due to C. difficile. She became weaker during her infection. After being treated her strength and gait improved.  Gait/strength/sensation:     She reports difficulty with her  gait. Currently she is using a cane. Rehabilitation has reportedly told her that she should use a wheelchair more. She notes weakness and spasticity in her legs, right leg worse than her left. Additionally she has some dysesthetic pain in the thighs in the hands. The hands are not significantly weak but they are clumsy. Her legs also clumsy.  Bladder: She reports urinary frequency and urgency. She also has hesitancy and she does not completely empty her bladder. She has to push on her abdomen to empty better. She usually does not have too much nocturia. Since the C. difficile has been treated she feels bowel function is more regular again.              Vision:   She denies any significant vision problem due to the MS. However, she did have diplopia when she was diagnosed back in early 2015. Additionally she had a blood vessel burst in the left eye.  Fatigue/sleep: She is not experiencing too much fatigue at this time but did have more in the past. In the past she was on modafinil with benefit. She is sleeping much better since her trazodone dose was increased to 50 mg at bedtime  Mood/cognition: She currently denies any significant depression. She does note some anxiety. She had been prescribed lamotrigine and she takes 100 mg nightly as a mood stabilizer  Pain: She reports pain in the left knee and also has  a crampy sensation in the thighs and both hips.                                            I personally reviewed her last MRI from 03/13/2015 showing multiple T2/FLAIR hyperintense foci, many of which are periventricular. She has some cortical atrophy. There is also a focus adjacent to C2 and some in the pons.   Recent lab results were also reviewed.   REVIEW OF SYSTEMS: Constitutional: No fevers, chills, sweats, or change in appetite.  She has fatigue and insomnia Eyes: No visual changes, double vision, eye pain Ear, nose and throat: No hearing loss, ear pain, nasal congestion, sore  throat Cardiovascular: No chest pain, palpitations Respiratory: No shortness of breath at rest or with exertion.   No wheezes GastrointestinaI: No nausea, vomiting, diarrhea, abdominal pain, fecal incontinence.   Recent C. difficile diarrhea Genitourinary: She has urinary hesitancy and does not completely empties the bladder. She also has urinary urgency and frequency. Musculoskeletal: No neck pain, back pain Integumentary: No rash, pruritus, skin lesions Neurological: as above Psychiatric: No depression at this time.  Some anxiety Endocrine: No palpitations, diaphoresis, change in appetite, change in weigh or increased thirst Hematologic/Lymphatic: No anemia, purpura, petechiae. Allergic/Immunologic: No itchy/runny eyes, nasal congestion, recent allergic reactions, rashes  ALLERGIES: Allergies  Allergen Reactions  . Baclofen Other (See Comments)    Confusion, muscle weakness, lethargy per daughter    HOME MEDICATIONS:  Current outpatient prescriptions:  .  Cholecalciferol (VITAMIN D3) 2000 UNITS capsule, Take 2,000 Units by mouth daily., Disp: , Rfl:  .  clonazePAM (KLONOPIN) 0.5 MG tablet, Take 1 tablet (0.5 mg total) by mouth at bedtime as needed. For sleep (Patient taking differently: Take 0.5 mg by mouth at bedtime as needed (sleep). ), Disp: 15 tablet, Rfl: 0 .  Investigational - Study Medication, Take 80 mg by mouth at bedtime. Study through [facility] Neurological Dr. Krista Blue ID # [id number] started early 2016, Disp: , Rfl:  .  lamoTRIgine (LAMICTAL) 100 MG tablet, Take 1 tablet (100 mg total) by mouth at bedtime., Disp: 30 tablet, Rfl: 0 .  natalizumab (TYSABRI) 300 MG/15ML injection, Inject 15 mLs (300 mg total) into the vein every 30 (thirty) days., Disp: 15 mL, Rfl:  .  Omega-3 Fatty Acids (OMEGA 3 500 PO), Take 500 mg by mouth 2 (two) times daily. , Disp: , Rfl:  .  traZODone (DESYREL) 50 MG tablet, Take 50 mg by mouth at bedtime as needed for sleep. , Disp: , Rfl: 0 .  vitamin  B-12 (CYANOCOBALAMIN) 1000 MCG tablet, Take 1,000 mcg by mouth daily., Disp: , Rfl:  .  modafinil (PROVIGIL) 200 MG tablet, Take 1 tablet (200 mg total) by mouth daily. (Patient not taking: Reported on 12/18/2015), Disp: 30 tablet, Rfl: 5 .  saccharomyces boulardii (FLORASTOR) 250 MG capsule, Take 1 capsule (250 mg total) by mouth 2 (two) times daily., Disp: 30 capsule, Rfl: 3  PAST MEDICAL HISTORY: Past Medical History  Diagnosis Date  . Elevated cholesterol   . Hypertension   . Arthritis   . Cervical dysplasia   . Depression   . Anxiety   . Macular degeneration of right eye 2001  . MS (multiple sclerosis) ([place]) 08/2013  . Acute encephalopathy 03/12/2015  . Anemia     PAST SURGICAL HISTORY: Past Surgical History  Procedure Laterality Date  .  Colposcopy    . Knee surgery Bilateral     "had cortisone injections in my knees"  . Cholecystectomy    . Tubal ligation    . Tonsillectomy    . Colonoscopy w/ polypectomy      FAMILY HISTORY: Family History  Problem Relation Age of Onset  . Cancer Mother     Colon  . Diabetes Mother   . Hypertension Mother   . Arthritis Mother   . Cancer Father     prostate  . Kidney disease Brother     congenital single kidney  . Arthritis Sister   . Arthritis Sister   . Hematuria Son   . Gout Brother   . Multiple sclerosis Brother   . Arthritis Brother   . HIV Brother   . Cancer Brother     spinal    SOCIAL HISTORY:  Social History   Social History  . Marital Status: Widowed    Spouse Name: N/A  . Number of Children: 2  . Years of Education: college   Occupational History  . retired     Social History Main Topics  . Smoking status: Former Smoker -- 1.00 packs/day for 45 years    Types: Cigarettes    Quit date: 10/23/2013  . Smokeless tobacco: Never Used     Comment: Quit smoking in 2014.  Marland Kitchen Alcohol Use: 0.0 oz/week    0 Standard drinks or equivalent per week     Comment: hasn't drank anything since jan 2015  . Drug Use:  No  . Sexual Activity: No   Other Topics Concern  . Not on file   Social History Narrative   Grew up in [place] area, 1 of 10 siblings - 58 still living, married for 33 years then divorced, moved to [place] to be near daughter post retirement, als has 1 son. 2 dogs.       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      PHYSICAL EXAM  Filed Vitals:   12/18/15 0853  BP: 106/66  Pulse: 76  Resp: 18  Height: 5' (1.524 m)  Weight: 115 lb 6.4 oz (52.345 kg)    Body mass index is 22.54 kg/(m^2).   General: The patient is well-developed and well-nourished and in no acute distress  Eyes:  Funduscopic exam shows normal optic discs and retinal vessels.     Neck: The neck is supple, no carotid bruits are noted.  The neck is nontender.  Skin: Extremities are without significant edema.  Musculoskeletal:  Back is nontender  Neurologic Exam  Mental status: The patient is alert and oriented x 3 at the time of the examination. The patient has apparent normal recent and remote memory, with an apparently normal attention span and concentration ability.   Speech is normal.  Cranial nerves: Extraocular movements are full. Pupils are equal, round, and reactive to light and accomodation.  Visual fields are full.  Facial symmetry is present. There is good facial sensation to soft touch bilaterally.Facial strength is normal.  Trapezius and sternocleidomastoid strength is normal. No dysarthria is noted.  The tongue is midline, and the patient has symmetric elevation of the soft palate. No obvious hearing deficits are noted.  Motor:  Muscle bulk is normal.   Tone is increased, right > left. Strength is  5 / 5 in all 4 extremities.   Sensory: Sensory testing show decreased right sided touch and vibration sensation.  Coordination: Cerebellar testing reveals  reduced right finger-nose-finger and reduced right worse than left heel-to-shin bilaterally.  Gait and station: Station  is normal.   Gait is wide and she has right foot drop. Cannot tandem. Romberg is positive.   Reflexes: Deep tendon reflexes are symmetric and normal bilaterally.   Plantar responses are flexor.    DIAGNOSTIC DATA (LABS, IMAGING, TESTING) - I reviewed patient records, labs, notes, testing and imaging myself where available.  Lab Results  Component Value Date   WBC 7.9 12/03/2015   HGB 12.8 12/03/2015   HCT 40.0 12/03/2015   MCV 83.2 12/03/2015   PLT 325 12/03/2015      Component Value Date/Time   NA 142 12/03/2015 0806   NA 136 08/10/2014 1304   K 3.9 12/03/2015 0806   CL 108 12/03/2015 0806   CO2 23 12/03/2015 0806   GLUCOSE 110* 12/03/2015 0806   GLUCOSE 90 08/10/2014 1304   BUN <5* 12/03/2015 0806   BUN 15 08/10/2014 1304   CREATININE 0.88 12/03/2015 0806   CREATININE 0.89 06/29/2013 1501   CALCIUM 9.6 12/03/2015 0806   PROT 6.4* 11/30/2015 1823   PROT 7.2 08/10/2014 1304   ALBUMIN 3.4* 11/30/2015 1823   ALBUMIN 4.5 08/10/2014 1304   AST 15 11/30/2015 1823   ALT 12* 11/30/2015 1823   ALKPHOS 63 11/30/2015 1823   BILITOT 0.7 11/30/2015 1823   GFRNONAA >60 12/03/2015 0806   GFRAA >60 12/03/2015 0806   Lab Results  Component Value Date   CHOL 186 11/11/2015   HDL 58 11/11/2015   LDLCALC 115* 11/11/2015   TRIG 65 11/11/2015   CHOLHDL 3.2 11/11/2015   Lab Results  Component Value Date   HGBA1C 5.3 11/11/2015   Lab Results  Component Value Date   VITAMINB12 2060* 11/11/2015   Lab Results  Component Value Date   TSH 0.515 11/30/2015       ASSESSMENT AND PLAN  Multiple sclerosis ([place]) - Plan: Stratify JCV Antibody Test ([facility]), CBC with Differential/Platelet  Right sided weakness  Gait difficulty  Insomnia  Other fatigue  Urinary hesitancy    1.   Tamsulosin 0.4 mg daily to help with bladder function 2.    Baclofen 10 mg by mouth 3 times a day, titrate further if needed 3.    Check JCV antibody and CBC with differential today 4.    She  will continue on Tysabri.  We also briefly discussed Lemtrada and ocrelizumab if she converts from low positive to high positive 5.   She will return to see me in 4 months or sooner if there are new or worsening neurologic symptoms.  40 minutes face-to-face evaluation with greater than one half of the time counseling correlating care about her MS and many related symptoms.  Milliani Herrada A. Felecia Shelling, MD, PhD [id number], [id number] AM Certified in Neurology, Clinical Neurophysiology, Sleep Medicine, Pain Medicine and Neuroimaging  [facility] [address] [place], [place] [address] [phone]

## 2015-12-19 ENCOUNTER — Ambulatory Visit: Payer: Medicare Other | Admitting: Neurology

## 2015-12-24 DIAGNOSIS — R5381 Other malaise: Secondary | ICD-10-CM | POA: Diagnosis not present

## 2015-12-24 DIAGNOSIS — R2689 Other abnormalities of gait and mobility: Secondary | ICD-10-CM | POA: Diagnosis not present

## 2015-12-24 DIAGNOSIS — M6281 Muscle weakness (generalized): Secondary | ICD-10-CM | POA: Diagnosis not present

## 2015-12-24 DIAGNOSIS — M25519 Pain in unspecified shoulder: Secondary | ICD-10-CM | POA: Diagnosis not present

## 2016-01-02 ENCOUNTER — Encounter (HOSPITAL_COMMUNITY): Payer: Self-pay | Admitting: Emergency Medicine

## 2016-01-02 ENCOUNTER — Emergency Department (HOSPITAL_COMMUNITY)
Admission: EM | Admit: 2016-01-02 | Discharge: 2016-01-02 | Disposition: A | Discharge status: Home / Self Care | Payer: Medicare Other | Attending: Physician Assistant | Admitting: Physician Assistant

## 2016-01-02 DIAGNOSIS — Z862 Personal history of diseases of the blood and blood-forming organs and certain disorders involving the immune mechanism: Secondary | ICD-10-CM | POA: Diagnosis not present

## 2016-01-02 DIAGNOSIS — Z79899 Other long term (current) drug therapy: Secondary | ICD-10-CM | POA: Diagnosis not present

## 2016-01-02 DIAGNOSIS — M79605 Pain in left leg: Secondary | ICD-10-CM | POA: Diagnosis not present

## 2016-01-02 DIAGNOSIS — M545 Low back pain: Secondary | ICD-10-CM | POA: Diagnosis present

## 2016-01-02 DIAGNOSIS — I10 Essential (primary) hypertension: Secondary | ICD-10-CM | POA: Insufficient documentation

## 2016-01-02 DIAGNOSIS — G35 Multiple sclerosis: Secondary | ICD-10-CM | POA: Diagnosis not present

## 2016-01-02 DIAGNOSIS — F419 Anxiety disorder, unspecified: Secondary | ICD-10-CM | POA: Insufficient documentation

## 2016-01-02 DIAGNOSIS — E7800 Pure hypercholesterolemia, unspecified: Secondary | ICD-10-CM | POA: Insufficient documentation

## 2016-01-02 DIAGNOSIS — M549 Dorsalgia, unspecified: Secondary | ICD-10-CM | POA: Diagnosis not present

## 2016-01-02 DIAGNOSIS — G8929 Other chronic pain: Secondary | ICD-10-CM | POA: Diagnosis not present

## 2016-01-02 DIAGNOSIS — M25562 Pain in left knee: Secondary | ICD-10-CM | POA: Diagnosis not present

## 2016-01-02 DIAGNOSIS — Z87891 Personal history of nicotine dependence: Secondary | ICD-10-CM | POA: Insufficient documentation

## 2016-01-02 DIAGNOSIS — F329 Major depressive disorder, single episode, unspecified: Secondary | ICD-10-CM | POA: Diagnosis not present

## 2016-01-02 DIAGNOSIS — Z8742 Personal history of other diseases of the female genital tract: Secondary | ICD-10-CM | POA: Diagnosis not present

## 2016-01-02 DIAGNOSIS — M25569 Pain in unspecified knee: Secondary | ICD-10-CM | POA: Diagnosis not present

## 2016-01-02 NOTE — ED Notes (Signed)
Pt stating "where is my red w/c.  They brought me back here in it."  Ptar informed do not transport w/c's in ambulances.  Pt then stated "well, if I get home and it's not there, I'm coming back."

## 2016-01-02 NOTE — ED Notes (Signed)
Waiting for [facility] for transport.  

## 2016-01-02 NOTE — Discharge Instructions (Signed)
Please take your pain medication at home as needed for your shoulder, back and knee pain. Follow up with your doctor for further care.    Chronic Pain Chronic pain can be defined as pain that is off and on and lasts for 3-6 months or longer. Many things cause chronic pain, which can make it difficult to make a diagnosis. There are many treatment options available for chronic pain. However, finding a treatment that works well for you may require trying various approaches until the right one is found. Many people benefit from a combination of two or more types of treatment to control their pain. SYMPTOMS  Chronic pain can occur anywhere in the body and can range from mild to very severe. Some types of chronic pain include:  Headache.  Low back pain.  Cancer pain.  Arthritis pain.  Neurogenic pain. This is pain resulting from damage to nerves. People with chronic pain may also have other symptoms such as:  Depression.  Anger.  Insomnia.  Anxiety. DIAGNOSIS  Your health care provider will help diagnose your condition over time. In many cases, the initial focus will be on excluding possible conditions that could be causing the pain. Depending on your symptoms, your health care provider may order tests to diagnose your condition. Some of these tests may include:   Blood tests.   CT scan.   MRI.   X-rays.   Ultrasounds.   Nerve conduction studies.  You may need to see a specialist.  TREATMENT  Finding treatment that works well may take time. You may be referred to a pain specialist. He or she may prescribe medicine or therapies, such as:   Mindful meditation or yoga.  Shots (injections) of numbing or pain-relieving medicines into the spine or area of pain.  Local electrical stimulation.  Acupuncture.   Massage therapy.   Aroma, color, light, or sound therapy.   Biofeedback.   Working with a physical therapist to keep from getting stiff.   Regular,  gentle exercise.   Cognitive or behavioral therapy.   Group support.  Sometimes, surgery may be recommended.  HOME CARE INSTRUCTIONS   Take all medicines as directed by your health care provider.   Lessen stress in your life by relaxing and doing things such as listening to calming music.   Exercise or be active as directed by your health care provider.   Eat a healthy diet and include things such as vegetables, fruits, fish, and lean meats in your diet.   Keep all follow-up appointments with your health care provider.   Attend a support group with others suffering from chronic pain. SEEK MEDICAL CARE IF:   Your pain gets worse.   You develop a new pain that was not there before.   You cannot tolerate medicines given to you by your health care provider.   You have new symptoms since your last visit with your health care provider.  SEEK IMMEDIATE MEDICAL CARE IF:   You feel weak.   You have decreased sensation or numbness.   You lose control of bowel or bladder function.   Your pain suddenly gets much worse.   You develop shaking.  You develop chills.  You develop confusion.  You develop chest pain.  You develop shortness of breath.  MAKE SURE YOU:  Understand these instructions.  Will watch your condition.  Will get help right away if you are not doing well or get worse.   This information is not intended to replace  advice given to you by your health care provider. Make sure you discuss any questions you have with your health care provider.   Document Released: 06/13/2002 Document Revised: 05/24/2013 Document Reviewed: 03/17/2013 Elsevier Interactive Patient Education [employer].

## 2016-01-02 NOTE — ED Notes (Signed)
Ptar here for transport 

## 2016-01-02 NOTE — ED Provider Notes (Signed)
CSN: [id number]     Arrival date & time 01/02/16  [id number] History  By signing my name below, I, Wanda [name], attest that this documentation has been prepared under the direction and in the presence of Domenic Moras, PA-C. Electronically Signed: Irene [name], ED Scribe. 01/02/2016. 1:19 PM.   Chief Complaint  Patient presents with  . Back Pain    low back pain at buttocks, left and right  . Leg Pain  . Knee Pain    chronic pain in l/knee from MS   The history is provided by the patient. No language interpreter was used.  HPI Comments: Wanda [name] is a 65 y.o. Female with a hx of MS, cervical dysplasia, HTN, arthritis, and acute encephalopathy brought in by [facility] who presents to the Emergency Department complaining of sharp low back pain onset one day ago. Pt reports associated left shoulder pain and left knee pain that radiates up to the lower back. She rates the pain 10/10. She states that she has "knots" in her back. Pt reports hx of "clicking" in her left knee that will cause intermittent weakness. She is followed by an orthopedist at [facility]. She states that she receives injections in the knee for these symptoms. She reports that she has been in rehab for her MS. Pt is ambulatory at home with a walker and a cane. Pt is a poor historian. She has not taken any of her daily medications today. She denies hx of similar pain, fall or injury, fever, numbness, weakness, bladder or bowel incontinence.   Past Medical History  Diagnosis Date  . Elevated cholesterol   . Hypertension   . Arthritis   . Cervical dysplasia   . Depression   . Anxiety   . Macular degeneration of right eye 2001  . MS (multiple sclerosis) ([place]) 08/2013  . Acute encephalopathy 03/12/2015  . Anemia    Past Surgical History  Procedure Laterality Date  . Colposcopy    . Knee surgery Bilateral     "had cortisone injections in my knees"  . Cholecystectomy    . Tubal ligation    . Tonsillectomy    .  Colonoscopy w/ polypectomy     Family History  Problem Relation Age of Onset  . Cancer Mother     Colon  . Diabetes Mother   . Hypertension Mother   . Arthritis Mother   . Cancer Father     prostate  . Kidney disease Brother     congenital single kidney  . Arthritis Sister   . Arthritis Sister   . Hematuria Son   . Gout Brother   . Multiple sclerosis Brother   . Arthritis Brother   . HIV Brother   . Cancer Brother     spinal   Social History  Substance Use Topics  . Smoking status: Former Smoker -- 1.00 packs/day for 45 years    Types: Cigarettes    Quit date: 10/23/2013  . Smokeless tobacco: Never Used     Comment: Quit smoking in 2014.  Marland Kitchen Alcohol Use: 0.0 oz/week    0 Standard drinks or equivalent per week     Comment: hasn't drank anything since jan 2015   OB History    Gravida Para Term Preterm AB TAB SAB Ectopic Multiple Living   6 3 3  3  3   2      Review of Systems  Constitutional: Negative for fever.  Musculoskeletal: Positive for back pain and arthralgias.  Neurological: Negative for weakness and numbness.   Allergies  Baclofen  Home Medications   Prior to Admission medications   Medication Sig Start Date End Date Taking? Authorizing Provider  baclofen (LIORESAL) 10 MG tablet Take 1 tablet (10 mg total) by mouth 3 (three) times daily. 12/18/15   Britt Bottom, MD  Cholecalciferol (VITAMIN D3) 2000 UNITS capsule Take 2,000 Units by mouth daily.    Historical Provider, MD  clonazePAM (KLONOPIN) 0.5 MG tablet Take 1 tablet (0.5 mg total) by mouth at bedtime as needed. For sleep Patient taking differently: Take 0.5 mg by mouth at bedtime as needed (sleep).  11/14/15   Domenic Polite, MD  Investigational - Study Medication Take 80 mg by mouth at bedtime. Study through [facility] Neurological Dr. Krista Blue ID # [phone] started early 2016    Historical Provider, MD  lamoTRIgine (LAMICTAL) 100 MG tablet Take 1 tablet (100 mg total) by mouth at bedtime. 12/05/15   Charlynne Cousins, MD  modafinil (PROVIGIL) 200 MG tablet Take 1 tablet (200 mg total) by mouth daily. Patient not taking: Reported on 12/18/2015 10/01/15   Marcial Pacas, MD  natalizumab (TYSABRI) 300 MG/15ML injection Inject 15 mLs (300 mg total) into the vein every 30 (thirty) days. 12/12/15   Charlynne Cousins, MD  Omega-3 Fatty Acids (OMEGA 3 500 PO) Take 500 mg by mouth 2 (two) times daily.     Historical Provider, MD  saccharomyces boulardii (FLORASTOR) 250 MG capsule Take 1 capsule (250 mg total) by mouth 2 (two) times daily. 12/05/15   Charlynne Cousins, MD  tamsulosin (FLOMAX) 0.4 MG CAPS capsule Take 1 capsule (0.4 mg total) by mouth daily. 12/18/15   Britt Bottom, MD  traZODone (DESYREL) 50 MG tablet Take 50 mg by mouth at bedtime as needed for sleep.  10/24/15   Historical Provider, MD  vitamin B-12 (CYANOCOBALAMIN) 1000 MCG tablet Take 1,000 mcg by mouth daily.    Historical Provider, MD   BP 129/83 mmHg  Pulse 69  Temp(Src) 98.1 F (36.7 C) (Oral)  Resp 16  SpO2 100% Physical Exam  Constitutional: She is oriented to person, place, and time. She appears well-developed and well-nourished.  HENT:  Head: Normocephalic and atraumatic.  Eyes: Conjunctivae and EOM are normal. Pupils are equal, round, and reactive to light.  Neck: Normal range of motion. Neck supple.  Cardiovascular: Intact distal pulses.   Musculoskeletal:       Left shoulder: Normal.       Left elbow: Normal.       Left wrist: Normal.       Left hip: Normal.       Left knee: Tenderness found.  Left knee tenderness noted to the inferior patellar region with palpation; with normal knee flexion and extension; normal varus and valgus maneuver; negative anterior and posterior drawer test; antalgic gait but able to ambulate; NVI; brisk cap refill; no significant midline spine tenderness without step offs, crepitus, or overlying skin changes  No significant midline spine tenderness, crepitus or step off.  Bilateral shoulders  are nontender with FROM.    Neurological: She is alert and oriented to person, place, and time. Coordination normal.  Strength and sensation intact  Skin: Skin is warm and dry.  Psychiatric: She has a normal mood and affect. Her behavior is normal.  Nursing note and vitals reviewed.   ED Course  Procedures (including critical care time) DIAGNOSTIC STUDIES: Oxygen Saturation is 100% on RA, normal by my interpretation.  COORDINATION OF CARE: 1:16 PM-Discussed treatment plan which includes home medications with pt at bedside and pt agreed to plan.    MDM   Patient with back pain.  No neurological deficits and normal neuro exam.  Patient can walk with antalgic gait but states is painful.  No loss of bowel or bladder control.  No concern for cauda equina.  No fever, night sweats, weight loss, h/o cancer, IVDU.  RICE protocol and pain medicine indicated and discussed with patient. Pt has multiple prescriptions for pain medications at home that she can take for pain control. There is low suspicion for broken bones so x-ray is not indicated at this time. Pt i NAD, no signs of infection.  Doubt acute emergent medical condition.    Final diagnoses:  Chronic pain    BP 129/83 mmHg  Pulse 69  Temp(Src) 98.1 F (36.7 C) (Oral)  Resp 16  SpO2 100%  I personally performed the services described in this documentation, which was scribed in my presence. The recorded information has been reviewed and is accurate.      Domenic Moras, PA-C 01/02/16 [place], MD 01/02/16 1610

## 2016-01-02 NOTE — ED Notes (Signed)
Per [facility] pt reports left lower back pain , left buttocks, and left leg pain . denies Hx sciatica. Denies fall nor injury. Alert and oriented x 4. No urinary symptoms .

## 2016-01-02 NOTE — ED Notes (Signed)
Pt stated that she fell while crawling to her bedroom and trying to get into bed today. Stated that he walker was in her bedroom. Stated that she used her walker to get out to the front porch and wait for a neighbor to come by and call the rescue squad. Pt is alert, cooperative but stated that she did not take any of her medications this morning because she did not have a drink of water.

## 2016-01-06 ENCOUNTER — Ambulatory Visit: Payer: Medicare Other | Admitting: Neurology

## 2016-01-08 ENCOUNTER — Encounter (HOSPITAL_COMMUNITY): Payer: Self-pay

## 2016-01-08 ENCOUNTER — Emergency Department (HOSPITAL_COMMUNITY): Payer: Medicare Other

## 2016-01-08 ENCOUNTER — Emergency Department (HOSPITAL_COMMUNITY)
Admission: EM | Admit: 2016-01-08 | Discharge: 2016-01-08 | Disposition: A | Discharge status: Home / Self Care | Payer: Medicare Other | Attending: Emergency Medicine | Admitting: Emergency Medicine

## 2016-01-08 DIAGNOSIS — Z8639 Personal history of other endocrine, nutritional and metabolic disease: Secondary | ICD-10-CM | POA: Diagnosis not present

## 2016-01-08 DIAGNOSIS — Z8669 Personal history of other diseases of the nervous system and sense organs: Secondary | ICD-10-CM

## 2016-01-08 DIAGNOSIS — Y9389 Activity, other specified: Secondary | ICD-10-CM | POA: Insufficient documentation

## 2016-01-08 DIAGNOSIS — Y999 Unspecified external cause status: Secondary | ICD-10-CM | POA: Diagnosis not present

## 2016-01-08 DIAGNOSIS — W182XXA Fall in (into) shower or empty bathtub, initial encounter: Secondary | ICD-10-CM | POA: Diagnosis not present

## 2016-01-08 DIAGNOSIS — F419 Anxiety disorder, unspecified: Secondary | ICD-10-CM | POA: Diagnosis not present

## 2016-01-08 DIAGNOSIS — S0990XA Unspecified injury of head, initial encounter: Secondary | ICD-10-CM | POA: Insufficient documentation

## 2016-01-08 DIAGNOSIS — S4991XA Unspecified injury of right shoulder and upper arm, initial encounter: Secondary | ICD-10-CM | POA: Diagnosis not present

## 2016-01-08 DIAGNOSIS — R52 Pain, unspecified: Secondary | ICD-10-CM

## 2016-01-08 DIAGNOSIS — S4992XA Unspecified injury of left shoulder and upper arm, initial encounter: Secondary | ICD-10-CM | POA: Insufficient documentation

## 2016-01-08 DIAGNOSIS — F329 Major depressive disorder, single episode, unspecified: Secondary | ICD-10-CM | POA: Diagnosis not present

## 2016-01-08 DIAGNOSIS — Z87891 Personal history of nicotine dependence: Secondary | ICD-10-CM | POA: Insufficient documentation

## 2016-01-08 DIAGNOSIS — Z862 Personal history of diseases of the blood and blood-forming organs and certain disorders involving the immune mechanism: Secondary | ICD-10-CM | POA: Insufficient documentation

## 2016-01-08 DIAGNOSIS — S93401A Sprain of unspecified ligament of right ankle, initial encounter: Secondary | ICD-10-CM | POA: Insufficient documentation

## 2016-01-08 DIAGNOSIS — I10 Essential (primary) hypertension: Secondary | ICD-10-CM | POA: Insufficient documentation

## 2016-01-08 DIAGNOSIS — Z8742 Personal history of other diseases of the female genital tract: Secondary | ICD-10-CM | POA: Insufficient documentation

## 2016-01-08 DIAGNOSIS — M7989 Other specified soft tissue disorders: Secondary | ICD-10-CM | POA: Diagnosis not present

## 2016-01-08 DIAGNOSIS — G35 Multiple sclerosis: Secondary | ICD-10-CM | POA: Diagnosis not present

## 2016-01-08 DIAGNOSIS — Z8739 Personal history of other diseases of the musculoskeletal system and connective tissue: Secondary | ICD-10-CM | POA: Insufficient documentation

## 2016-01-08 DIAGNOSIS — Y9289 Other specified places as the place of occurrence of the external cause: Secondary | ICD-10-CM | POA: Insufficient documentation

## 2016-01-08 DIAGNOSIS — R079 Chest pain, unspecified: Secondary | ICD-10-CM | POA: Diagnosis not present

## 2016-01-08 LAB — BASIC METABOLIC PANEL
Anion gap: 9 (ref 5–15)
BUN: 8 mg/dL (ref 6–20)
CALCIUM: 9.2 mg/dL (ref 8.9–10.3)
CHLORIDE: 110 mmol/L (ref 101–111)
CO2: 26 mmol/L (ref 22–32)
CREATININE: 1.09 mg/dL — AB (ref 0.44–1.00)
GFR calc non Af Amer: 52 mL/min — ABNORMAL LOW (ref >60)
GLUCOSE: 110 mg/dL — AB (ref 65–99)
Potassium: 4.1 mmol/L (ref 3.5–5.1)
Sodium: 145 mmol/L (ref 135–145)

## 2016-01-08 LAB — CBC WITH DIFFERENTIAL/PLATELET
BASOS PCT: 1 %
Basophils Absolute: 0 10*3/uL (ref 0.0–0.1)
EOS ABS: 0.2 10*3/uL (ref 0.0–0.7)
Eosinophils Relative: 3 %
HEMATOCRIT: 35.6 % — AB (ref 36.0–46.0)
HEMOGLOBIN: 11.4 g/dL — AB (ref 12.0–15.0)
LYMPHS ABS: 2.1 10*3/uL (ref 0.7–4.0)
Lymphocytes Relative: 41 %
MCH: 27.3 pg (ref 26.0–34.0)
MCHC: 32 g/dL (ref 30.0–36.0)
MCV: 85.2 fL (ref 78.0–100.0)
MONO ABS: 0.4 10*3/uL (ref 0.1–1.0)
MONOS PCT: 8 %
NEUTROS ABS: 2.5 10*3/uL (ref 1.7–7.7)
NEUTROS PCT: 47 %
Platelets: 168 10*3/uL (ref 150–400)
RBC: 4.18 MIL/uL (ref 3.87–5.11)
RDW: 16.4 % — ABNORMAL HIGH (ref 11.5–15.5)
WBC: 5.2 10*3/uL (ref 4.0–10.5)

## 2016-01-08 NOTE — ED Notes (Signed)
Pt stable, states understanding of discharge instructions, pt contacted daughter for pick up

## 2016-01-08 NOTE — ED Notes (Signed)
Pt requesting call daughter, requesting to go home.  Dr. Stark Jock notified.

## 2016-01-08 NOTE — ED Notes (Signed)
[id number]

## 2016-01-08 NOTE — Discharge Instructions (Signed)
Follow-up with your neurologist to discuss your medications and living arrangements.   Concussion, Adult A concussion, or closed-head injury, is a brain injury caused by a direct blow to the head or by a quick and sudden movement (jolt) of the head or neck. Concussions are usually not life-threatening. Even so, the effects of a concussion can be serious. If you have had a concussion before, you are more likely to experience concussion-like symptoms after a direct blow to the head.  CAUSES  Direct blow to the head, such as from running into another player during a soccer game, being hit in a fight, or hitting your head on a hard surface.  A jolt of the head or neck that causes the brain to move back and forth inside the skull, such as in a car crash. SIGNS AND SYMPTOMS The signs of a concussion can be hard to notice. Early on, they may be missed by you, family members, and health care providers. You may look fine but act or feel differently. Symptoms are usually temporary, but they may last for days, weeks, or even longer. Some symptoms may appear right away while others may not show up for hours or days. Every head injury is different. Symptoms include:  Mild to moderate headaches that will not go away.  A feeling of pressure inside your head.  Having more trouble than usual:  Learning or remembering things you have heard.  Answering questions.  Paying attention or concentrating.  Organizing daily tasks.  Making decisions and solving problems.  Slowness in thinking, acting or reacting, speaking, or reading.  Getting lost or being easily confused.  Feeling tired all the time or lacking energy (fatigued).  Feeling drowsy.  Sleep disturbances.  Sleeping more than usual.  Sleeping less than usual.  Trouble falling asleep.  Trouble sleeping (insomnia).  Loss of balance or feeling lightheaded or dizzy.  Nausea or vomiting.  Numbness or tingling.  Increased sensitivity  to:  Sounds.  Lights.  Distractions.  Vision problems or eyes that tire easily.  Diminished sense of taste or smell.  Ringing in the ears.  Mood changes such as feeling sad or anxious.  Becoming easily irritated or angry for little or no reason.  Lack of motivation.  Seeing or hearing things other people do not see or hear (hallucinations). DIAGNOSIS Your health care provider can usually diagnose a concussion based on a description of your injury and symptoms. He or she will ask whether you passed out (lost consciousness) and whether you are having trouble remembering events that happened right before and during your injury. Your evaluation might include:  A brain scan to look for signs of injury to the brain. Even if the test shows no injury, you may still have a concussion.  Blood tests to be sure other problems are not present. TREATMENT  Concussions are usually treated in an emergency department, in urgent care, or at a clinic. You may need to stay in the hospital overnight for further treatment.  Tell your health care provider if you are taking any medicines, including prescription medicines, over-the-counter medicines, and natural remedies. Some medicines, such as blood thinners (anticoagulants) and aspirin, may increase the chance of complications. Also tell your health care provider whether you have had alcohol or are taking illegal drugs. This information may affect treatment.  Your health care provider will send you home with important instructions to follow.  How fast you will recover from a concussion depends on many factors. These factors  include how severe your concussion is, what part of your brain was injured, your age, and how healthy you were before the concussion.  Most people with mild injuries recover fully. Recovery can take time. In general, recovery is slower in older persons. Also, persons who have had a concussion in the past or have other medical  problems may find that it takes longer to recover from their current injury. HOME CARE INSTRUCTIONS General Instructions  Carefully follow the directions your health care provider gave you.  Only take over-the-counter or prescription medicines for pain, discomfort, or fever as directed by your health care provider.  Take only those medicines that your health care provider has approved.  Do not drink alcohol until your health care provider says you are well enough to do so. Alcohol and certain other drugs may slow your recovery and can put you at risk of further injury.  If it is harder than usual to remember things, write them down.  If you are easily distracted, try to do one thing at a time. For example, do not try to watch TV while fixing dinner.  Talk with family members or close friends when making important decisions.  Keep all follow-up appointments. Repeated evaluation of your symptoms is recommended for your recovery.  Watch your symptoms and tell others to do the same. Complications sometimes occur after a concussion. Older adults with a brain injury may have a higher risk of serious complications, such as a blood clot on the brain.  Tell your teachers, school nurse, school counselor, coach, athletic trainer, or work Freight forwarder about your injury, symptoms, and restrictions. Tell them about what you can or cannot do. They should watch for:  Increased problems with attention or concentration.  Increased difficulty remembering or learning new information.  Increased time needed to complete tasks or assignments.  Increased irritability or decreased ability to cope with stress.  Increased symptoms.  Rest. Rest helps the brain to heal. Make sure you:  Get plenty of sleep at night. Avoid staying up late at night.  Keep the same bedtime hours on weekends and weekdays.  Rest during the day. Take daytime naps or rest breaks when you feel tired.  Limit activities that require a  lot of thought or concentration. These include:  Doing homework or job-related work.  Watching TV.  Working on the computer.  Avoid any situation where there is potential for another head injury (football, hockey, soccer, basketball, martial arts, downhill snow sports and horseback riding). Your condition will get worse every time you experience a concussion. You should avoid these activities until you are evaluated by the appropriate follow-up health care providers. Returning To Your Regular Activities You will need to return to your normal activities slowly, not all at once. You must give your body and brain enough time for recovery.  Do not return to sports or other athletic activities until your health care provider tells you it is safe to do so.  Ask your health care provider when you can drive, ride a bicycle, or operate heavy machinery. Your ability to react may be slower after a brain injury. Never do these activities if you are dizzy.  Ask your health care provider about when you can return to work or school. Preventing Another Concussion It is very important to avoid another brain injury, especially before you have recovered. In rare cases, another injury can lead to permanent brain damage, brain swelling, or death. The risk of this is greatest during the  first 7-10 days after a head injury. Avoid injuries by:  Wearing a seat belt when riding in a car.  Drinking alcohol only in moderation.  Wearing a helmet when biking, skiing, skateboarding, skating, or doing similar activities.  Avoiding activities that could lead to a second concussion, such as contact or recreational sports, until your health care provider says it is okay.  Taking safety measures in your home.  Remove clutter and tripping hazards from floors and stairways.  Use grab bars in bathrooms and handrails by stairs.  Place non-slip mats on floors and in bathtubs.  Improve lighting in dim areas. SEEK MEDICAL  CARE IF:  You have increased problems paying attention or concentrating.  You have increased difficulty remembering or learning new information.  You need more time to complete tasks or assignments than before.  You have increased irritability or decreased ability to cope with stress.  You have more symptoms than before. Seek medical care if you have any of the following symptoms for more than 2 weeks after your injury:  Lasting (chronic) headaches.  Dizziness or balance problems.  Nausea.  Vision problems.  Increased sensitivity to noise or light.  Depression or mood swings.  Anxiety or irritability.  Memory problems.  Difficulty concentrating or paying attention.  Sleep problems.  Feeling tired all the time. SEEK IMMEDIATE MEDICAL CARE IF:  You have severe or worsening headaches. These may be a sign of a blood clot in the brain.  You have weakness (even if only in one hand, leg, or part of the face).  You have numbness.  You have decreased coordination.  You vomit repeatedly.  You have increased sleepiness.  One pupil is larger than the other.  You have convulsions.  You have slurred speech.  You have increased confusion. This may be a sign of a blood clot in the brain.  You have increased restlessness, agitation, or irritability.  You are unable to recognize people or places.  You have neck pain.  It is difficult to wake you up.  You have unusual behavior changes.  You lose consciousness. MAKE SURE YOU:  Understand these instructions.  Will watch your condition.  Will get help right away if you are not doing well or get worse.   This information is not intended to replace advice given to you by your health care provider. Make sure you discuss any questions you have with your health care provider.   Document Released: 12/12/2003 Document Revised: 10/12/2014 Document Reviewed: 04/13/2013 Elsevier Interactive Patient Education 2016  [place].  Ankle Sprain An ankle sprain is an injury to the strong, fibrous tissues (ligaments) that hold the bones of your ankle joint together.  CAUSES An ankle sprain is usually caused by a fall or by twisting your ankle. Ankle sprains most commonly occur when you step on the outer edge of your foot, and your ankle turns inward. People who participate in sports are more prone to these types of injuries.  SYMPTOMS   Pain in your ankle. The pain may be present at rest or only when you are trying to stand or walk.  Swelling.  Bruising. Bruising may develop immediately or within 1 to 2 days after your injury.  Difficulty standing or walking, particularly when turning corners or changing directions. DIAGNOSIS  Your caregiver will ask you details about your injury and perform a physical exam of your ankle to determine if you have an ankle sprain. During the physical exam, your caregiver will press on  and apply pressure to specific areas of your foot and ankle. Your caregiver will try to move your ankle in certain ways. An X-ray exam may be done to be sure a bone was not broken or a ligament did not separate from one of the bones in your ankle (avulsion fracture).  TREATMENT  Certain types of braces can help stabilize your ankle. Your caregiver can make a recommendation for this. Your caregiver may recommend the use of medicine for pain. If your sprain is severe, your caregiver may refer you to a surgeon who helps to restore function to parts of your skeletal system (orthopedist) or a physical therapist. [place] ice to your injury for 1-2 days or as directed by your caregiver. Applying ice helps to reduce inflammation and pain.  Put ice in a plastic bag.  Place a towel between your skin and the bag.  Leave the ice on for 15-20 minutes at a time, every 2 hours while you are awake.  Only take over-the-counter or prescription medicines for pain, discomfort, or fever  as directed by your caregiver.  Elevate your injured ankle above the level of your heart as much as possible for 2-3 days.  If your caregiver recommends crutches, use them as instructed. Gradually put weight on the affected ankle. Continue to use crutches or a cane until you can walk without feeling pain in your ankle.  If you have a plaster splint, wear the splint as directed by your caregiver. Do not rest it on anything harder than a pillow for the first 24 hours. Do not put weight on it. Do not get it wet. You may take it off to take a shower or bath.  You may have been given an elastic bandage to wear around your ankle to provide support. If the elastic bandage is too tight (you have numbness or tingling in your foot or your foot becomes cold and blue), adjust the bandage to make it comfortable.  If you have an air splint, you may blow more air into it or let air out to make it more comfortable. You may take your splint off at night and before taking a shower or bath. Wiggle your toes in the splint several times per day to decrease swelling. SEEK MEDICAL CARE IF:   You have rapidly increasing bruising or swelling.  Your toes feel extremely cold or you lose feeling in your foot.  Your pain is not relieved with medicine. SEEK IMMEDIATE MEDICAL CARE IF:  Your toes are numb or blue.  You have severe pain that is increasing. MAKE SURE YOU:   Understand these instructions.  Will watch your condition.  Will get help right away if you are not doing well or get worse.   This information is not intended to replace advice given to you by your health care provider. Make sure you discuss any questions you have with your health care provider.   Document Released: 09/21/2005 Document Revised: 10/12/2014 Document Reviewed: 10/03/2011 Elsevier Interactive Patient Education [employer].

## 2016-01-08 NOTE — ED Notes (Addendum)
Patient here with frequent falls and increased confusion the past few days. Reports multiple falls and fell yesterday in bath tub from chair into shower. Complains of left side and shoulder pain. Arrived in wheelchair. Alert and oriented

## 2016-01-08 NOTE — ED Provider Notes (Signed)
CSN: [id number]     Arrival date & time 01/08/16  1216 History   First MD Initiated Contact with Patient 01/08/16 1704     Chief Complaint  Patient presents with  . Fall     (Consider location/radiation/quality/duration/timing/severity/associated sxs/prior Treatment) HPI Comments: Patient is a 65 year old female with history of multiple sclerosis. She presents for evaluation of multiple falls. She has been evaluated here on multiple occasions recently with similar complaints. She is complaining of pain in her shoulders, left ankle. She also reports that she struck her head several times. She denies any loss of consciousness but does report headache.  In speaking with the daughter, there are multiple social issues which like a this situation. This patient is very independent and has declined assisted living placement in the past. She was recently discharged from a rehabilitation facility and was to go into independent living, however she refused this at the last minute and returned home. The daughter states that she is now back to her usual living by herself and falling frequently. The daughter does not feel as though she is safe at home and arrangements are being made for potentially having home health assistance.  The history is provided by the patient.    Past Medical History  Diagnosis Date  . Elevated cholesterol   . Hypertension   . Arthritis   . Cervical dysplasia   . Depression   . Anxiety   . Macular degeneration of right eye 2001  . MS (multiple sclerosis) ([place]) 08/2013  . Acute encephalopathy 03/12/2015  . Anemia    Past Surgical History  Procedure Laterality Date  . Colposcopy    . Knee surgery Bilateral     "had cortisone injections in my knees"  . Cholecystectomy    . Tubal ligation    . Tonsillectomy    . Colonoscopy w/ polypectomy     Family History  Problem Relation Age of Onset  . Cancer Mother     Colon  . Diabetes Mother   . Hypertension Mother   .  Arthritis Mother   . Cancer Father     prostate  . Kidney disease Brother     congenital single kidney  . Arthritis Sister   . Arthritis Sister   . Hematuria Son   . Gout Brother   . Multiple sclerosis Brother   . Arthritis Brother   . HIV Brother   . Cancer Brother     spinal   Social History  Substance Use Topics  . Smoking status: Former Smoker -- 1.00 packs/day for 45 years    Types: Cigarettes    Quit date: 10/23/2013  . Smokeless tobacco: Never Used     Comment: Quit smoking in 2014.  Marland Kitchen Alcohol Use: 0.0 oz/week    0 Standard drinks or equivalent per week     Comment: hasn't drank anything since jan 2015   OB History    Gravida Para Term Preterm AB TAB SAB Ectopic Multiple Living   6 3 3  3  3   2      Review of Systems  All other systems reviewed and are negative.     Allergies  Review of patient's allergies indicates no active allergies.  Home Medications   Prior to Admission medications   Medication Sig Start Date End Date Taking? Authorizing Provider  baclofen (LIORESAL) 10 MG tablet Take 1 tablet (10 mg total) by mouth 3 (three) times daily. 12/18/15  Yes Britt Bottom, MD  Cholecalciferol (  VITAMIN D3) 2000 UNITS capsule Take 2,000 Units by mouth daily.   Yes Historical Provider, MD  clonazePAM (KLONOPIN) 0.5 MG tablet Take 1 tablet (0.5 mg total) by mouth at bedtime as needed. For sleep Patient taking differently: Take 0.5 mg by mouth at bedtime as needed (sleep).  11/14/15  Yes Domenic Polite, MD  lamoTRIgine (LAMICTAL) 100 MG tablet Take 1 tablet (100 mg total) by mouth at bedtime. 12/05/15  Yes Charlynne Cousins, MD  natalizumab (TYSABRI) 300 MG/15ML injection Inject 15 mLs (300 mg total) into the vein every 30 (thirty) days. 12/12/15  Yes Charlynne Cousins, MD  Omega-3 Fatty Acids (OMEGA 3 500 PO) Take 500 mg by mouth 2 (two) times daily.    Yes Historical Provider, MD  saccharomyces boulardii (FLORASTOR) 250 MG capsule Take 1 capsule (250 mg total) by  mouth 2 (two) times daily. 12/05/15  Yes Charlynne Cousins, MD  tamsulosin (FLOMAX) 0.4 MG CAPS capsule Take 1 capsule (0.4 mg total) by mouth daily. 12/18/15  Yes Britt Bottom, MD  modafinil (PROVIGIL) 200 MG tablet Take 1 tablet (200 mg total) by mouth daily. Patient not taking: Reported on 12/18/2015 10/01/15   Marcial Pacas, MD   BP 143/84 mmHg  Pulse 79  Temp(Src) 98.3 F (36.8 C)  Resp 18  Ht 5' (1.524 m)  Wt 114 lb (51.71 kg)  BMI 22.26 kg/m2  SpO2 100% Physical Exam  Constitutional: She is oriented to person, place, and time. She appears well-developed and well-nourished. No distress.  HENT:  Head: Normocephalic and atraumatic.  Mouth/Throat: Oropharynx is clear and moist.  Eyes: EOM are normal. Pupils are equal, round, and reactive to light.  Neck: Normal range of motion. Neck supple.  Cardiovascular: Normal rate and regular rhythm.  Exam reveals no gallop and no friction rub.   No murmur heard. Pulmonary/Chest: Effort normal and breath sounds normal. No respiratory distress. She has no wheezes.  Abdominal: Soft. Bowel sounds are normal. She exhibits no distension. There is no tenderness.  Musculoskeletal: Normal range of motion.  Neurological: She is alert and oriented to person, place, and time. No cranial nerve deficit. She exhibits normal muscle tone. Coordination normal.  Skin: Skin is warm and dry. She is not diaphoretic.  Nursing note and vitals reviewed.   ED Course  Procedures (including critical care time) Labs Review Labs Reviewed  BASIC METABOLIC PANEL  CBC WITH DIFFERENTIAL/PLATELET  URINALYSIS, ROUTINE W REFLEX MICROSCOPIC (NOT AT [facility])    Imaging Review No results found. I have personally reviewed and evaluated these images and lab results as part of my medical decision-making.    MDM   Final diagnoses:  None    No evidence for acute injury resulting from her falls or anything in the workup that would be a cause of her fall. I see nothing  medically that would require admission. She should follow-up with her neurologist to discuss her situation and medications.  There do appear to be multiple social issues with this patient. Her and her daughter appear to have somewhat of a tumultuous relationship. From what the patient tells me, there are arrangements being made for her to have home health and have someone look after her at home as the patient has refused assisted living/extended care facility placement.    Veryl Speak, MD 01/08/16 2044

## 2016-01-16 ENCOUNTER — Telehealth: Payer: Self-pay

## 2016-01-16 NOTE — Telephone Encounter (Signed)
Wanda [name] from [facility] needs the office visit note from Oct 29, 2015 faxed to [phone] ATTENTION: ASHLEY  Please advise  [phone]

## 2016-01-16 NOTE — Telephone Encounter (Signed)
Faxed OV note.  

## 2016-01-16 NOTE — Telephone Encounter (Signed)
[name]   Daughter wants to discuss with you options regarding patient and assisted living.   Patient has an appointment with you on Tuesday April 18th.  [name]  [phone]

## 2016-01-16 NOTE — Telephone Encounter (Signed)
Spoke with pt's daughter. She states her mom is not letting her put her in a facility. She is not taking her medications correctly.and she has experienced more falls. SHe needs medication management for her mom because her mom will not listen to her and she thinks that she is just trying to take away her medication. She states her mom cannot live alone anymore but she refuses to go to assisted living. She thinks maybe if she hears this from [facility], she would be more willing to go. Her daughter would like to speak with [name] if possible about options. I would think maybe start out with Home Health, let me know if you need my help with anything. Thanks.

## 2016-01-17 DIAGNOSIS — G35 Multiple sclerosis: Secondary | ICD-10-CM | POA: Diagnosis not present

## 2016-01-17 NOTE — Telephone Encounter (Signed)
I think that Home Health evaluation is the right place to start. We had ordered that before, but she kept falling and being admitted before it could be scheduled.  I am very disappointed that after her most recent hospitalization, she was discharged to home, and without Home Health.  Let's go ahead and get an urgent Home Health evaluation.  Her daughter should definitely explore the assisted living options. She can schedule appointments and go visit (ideally take her mother) and see which she likes best.   Encourage the daughter to also contact neurology, as they may have specific recommendations that can help her, with regards to medicaitons.  I will plan to see them on Tuesday, 01/21/2016.

## 2016-01-20 NOTE — Telephone Encounter (Signed)
Home health order pended. Please sign after visit note is complete. They should be here tomorrow to see you.

## 2016-01-21 ENCOUNTER — Encounter: Payer: Self-pay | Admitting: Physician Assistant

## 2016-01-21 ENCOUNTER — Ambulatory Visit (INDEPENDENT_AMBULATORY_CARE_PROVIDER_SITE_OTHER): Payer: Medicare Other | Admitting: Physician Assistant

## 2016-01-21 VITALS — BP 118/82 | HR 85 | Temp 98.5°F | Resp 16 | Ht 61.0 in | Wt 111.0 lb

## 2016-01-21 DIAGNOSIS — M6289 Other specified disorders of muscle: Secondary | ICD-10-CM | POA: Diagnosis not present

## 2016-01-21 DIAGNOSIS — R3911 Hesitancy of micturition: Secondary | ICD-10-CM

## 2016-01-21 DIAGNOSIS — G4700 Insomnia, unspecified: Secondary | ICD-10-CM

## 2016-01-21 DIAGNOSIS — M1990 Unspecified osteoarthritis, unspecified site: Secondary | ICD-10-CM | POA: Diagnosis not present

## 2016-01-21 DIAGNOSIS — G35 Multiple sclerosis: Secondary | ICD-10-CM | POA: Diagnosis not present

## 2016-01-21 DIAGNOSIS — R269 Unspecified abnormalities of gait and mobility: Secondary | ICD-10-CM | POA: Diagnosis not present

## 2016-01-21 DIAGNOSIS — I10 Essential (primary) hypertension: Secondary | ICD-10-CM

## 2016-01-21 DIAGNOSIS — R531 Weakness: Secondary | ICD-10-CM

## 2016-01-21 MED ORDER — OXYBUTYNIN CHLORIDE ER 10 MG PO TB24: ORAL_TABLET | ORAL | Status: DC

## 2016-01-21 MED ORDER — BACLOFEN 10 MG PO TABS: 10.0000 mg | ORAL_TABLET | Freq: Three times a day (TID) | ORAL | Status: DC

## 2016-01-21 MED ORDER — TRAZODONE HCL 100 MG PO TABS: ORAL_TABLET | ORAL | Status: DC

## 2016-01-21 MED ORDER — MODAFINIL 200 MG PO TABS: 200.0000 mg | ORAL_TABLET | Freq: Every day | ORAL | Status: DC

## 2016-01-21 MED ORDER — LAMOTRIGINE 100 MG PO TABS: 100.0000 mg | ORAL_TABLET | Freq: Every day | ORAL | Status: DC

## 2016-01-21 MED ORDER — AMLODIPINE BESYLATE 5 MG PO TABS: ORAL_TABLET | ORAL | Status: DC
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
## 2016-01-21 NOTE — Patient Instructions (Addendum)
1. The home health agency will call you to schedule a visit and evaluation.  2. Please call the eye specialist to make an appointment regarding the cataract.     IF you received an x-ray today, you will receive an invoice from [facility]. Please contact [facility] at [phone] with questions or concerns regarding your invoice.   IF you received labwork today, you will receive an invoice from [employer]. Please contact [facility] at [phone] with questions or concerns regarding your invoice.   Our billing staff will not be able to assist you with questions regarding bills from these companies.  You will be contacted with the lab results as soon as they are available. The fastest way to get your results is to activate your My Chart account. Instructions are located on the last page of this paperwork. If you have not heard from [place] regarding the results in 2 weeks, please contact this office.

## 2016-01-22 NOTE — Progress Notes (Signed)
Baclofen as a muscle relaxant not an addictive pain medicine.    At her last visit I put her on 10 mg by mouth 3 times a day when necessary

## 2016-01-26 NOTE — Telephone Encounter (Signed)
Patient referred during her visit with me 4/18. She was denied due to not having a skilled need.

## 2016-01-27 DIAGNOSIS — B300 Keratoconjunctivitis due to adenovirus: Secondary | ICD-10-CM | POA: Diagnosis not present

## 2016-01-27 DIAGNOSIS — H179 Unspecified corneal scar and opacity: Secondary | ICD-10-CM | POA: Diagnosis not present

## 2016-01-29 ENCOUNTER — Telehealth: Payer: Self-pay | Admitting: Neurology

## 2016-01-29 NOTE — Telephone Encounter (Signed)
Message printed, given to Wanda [name] in the infusion suite/fim

## 2016-01-29 NOTE — Telephone Encounter (Signed)
[employer] [phone] [id number] called said she needs checklist for 12/18/15 OV.

## 2016-02-05 ENCOUNTER — Encounter: Payer: Self-pay | Admitting: *Deleted

## 2016-02-06 ENCOUNTER — Encounter: Payer: Self-pay | Admitting: *Deleted

## 2016-02-12 ENCOUNTER — Telehealth: Payer: Self-pay

## 2016-02-12 NOTE — Telephone Encounter (Signed)
PA completed on covermymeds for generic provigil. Pt was started on this 08/22/14 by neurologist for excessive fatigue secondary to MS. I do not see that pt has tried/failed any other meds for this. Pending.

## 2016-02-13 DIAGNOSIS — G35 Multiple sclerosis: Secondary | ICD-10-CM | POA: Diagnosis not present

## 2016-02-13 NOTE — Telephone Encounter (Signed)
Any appeal or alternate product should come from neurology.  I refilled the medications as a courtesy following patient's recent hospitalization.

## 2016-02-13 NOTE — Telephone Encounter (Signed)
PA was denied because drug is not covered for pt's Dx, considered off label. FDA guidelines allow for coverage only for fatigue assoc with narcoloesy, OSA, or circadian rhythm disruption (shift worker disorder). [name], do you want to try something else, or appeal? The denial did not list any meds that would be covered, probably stimulants.

## 2016-02-18 ENCOUNTER — Ambulatory Visit (INDEPENDENT_AMBULATORY_CARE_PROVIDER_SITE_OTHER): Payer: Commercial Managed Care - HMO | Admitting: Physician Assistant

## 2016-02-18 ENCOUNTER — Encounter: Payer: Self-pay | Admitting: Physician Assistant

## 2016-02-18 VITALS — BP 123/87 | HR 80 | Temp 97.6°F | Resp 16 | Ht 61.0 in | Wt 108.0 lb

## 2016-02-18 DIAGNOSIS — G4700 Insomnia, unspecified: Secondary | ICD-10-CM

## 2016-02-18 DIAGNOSIS — G35 Multiple sclerosis: Secondary | ICD-10-CM

## 2016-02-18 DIAGNOSIS — I10 Essential (primary) hypertension: Secondary | ICD-10-CM | POA: Diagnosis not present

## 2016-02-18 MED ORDER — TRAZODONE HCL 100 MG PO TABS: ORAL_TABLET | ORAL | Status: DC

## 2016-02-18 NOTE — Patient Instructions (Addendum)
[employer] [place] [employer] [phone]   We recommend that you schedule a mammogram for breast cancer screening. Typically, you do not need a referral to do this. Please contact a local imaging center to schedule your mammogram.  [facility] - [phone]  *ask for the Radiology Department [facility] ([place]) - [phone] or [phone]  [facility] - [phone] [place] [phone] [facility] - [phone]  *ask for the [facility] - [phone]  *ask for the Radiology Department [facility] - [phone]  *ask for the [place] - [phone]   IF you received an x-ray today, you will receive an invoice from [facility]. Please contact [facility] at [phone] with questions or concerns regarding your invoice.   IF you received labwork today, you will receive an invoice from [employer]. Please contact [facility] at [phone] with questions or concerns regarding your invoice.   Our billing staff will not be able to assist you with questions regarding bills from these companies.  You will be contacted with the lab results as soon as they are available. The fastest way to get your results is to activate your My Chart account. Instructions are located on the last page of this paperwork. If you have not heard from [place] regarding the results in 2 weeks, please contact this office.

## 2016-02-18 NOTE — Progress Notes (Signed)
Patient ID: Wanda [name], female    DOB: 1951-05-17, 65 y.o.   MRN: [id number]  PCP: Wynne Dust  Subjective:   Chief Complaint  Patient presents with  . Follow-up  . Multiple Sclerosis    HPI Presents for evaluation of her chronic medical problems.  Really, she just needs a refill of the trazodone to help her sleep. Has not been in the hospital or ED since I saw her 4 weeks ago. Home health evaluation identified no skilled needs, so had nothing to offer. She wants help with meals. Getting groceries is hard due to her mobility.  Considering [employer]Whe[employer].  Ambulates with a straight cane today.  Is learning to use a hard contact lens to correct a visual disturbance that developed as part of one of the ED/hospitalizations over the past several months.  She isn't taking the Provigil due to the cost. Isn't taking tamsulosin, and isn't sure what it is or why she was given it.  Review of Systems  Constitutional: Negative for activity change, appetite change, fatigue and unexpected weight change.  HENT: Negative for congestion, dental problem, ear pain, hearing loss, mouth sores, postnasal drip, rhinorrhea, sneezing, sore throat, tinnitus and trouble swallowing.   Eyes: Negative for photophobia, pain, redness and visual disturbance.  Respiratory: Negative for cough, chest tightness and shortness of breath.   Cardiovascular: Negative for chest pain, palpitations and leg swelling.  Gastrointestinal: Negative for nausea, vomiting, abdominal pain, diarrhea, constipation and blood in stool.  Genitourinary: Negative for dysuria, urgency, frequency and hematuria.  Musculoskeletal: Positive for gait problem. Negative for myalgias, arthralgias and neck stiffness.  Skin: Negative for rash.  Neurological: Positive for weakness. Negative for dizziness, speech difficulty, light-headedness, numbness and headaches.  Hematological: Negative for adenopathy.  Psychiatric/Behavioral:  Positive for sleep disturbance. Negative for confusion. The patient is not nervous/anxious.        Patient Active Problem List   Diagnosis Date Noted  . Other fatigue 12/18/2015  . Urinary hesitancy 12/18/2015  . Polypharmacy   . Cataract   . Right sided weakness   . Normocytic anemia 07/30/2015  . Hypokalemia   . Gait difficulty 08/10/2014  . Multiple sclerosis ([place]) 10/25/2013  . Insomnia 03/23/2013  . Fibroids 12/29/2012  . Elevated cholesterol   . Hypertension   . Arthritis   . Cervical dysplasia      Prior to Admission medications   Medication Sig Start Date End Date Taking? Authorizing Provider  amLODipine (NORVASC) 5 MG tablet take 1 tablet by mouth once daily FOR ESSENTIAL HYPERTENSION. 01/21/16  Yes Remmie Bembenek, PA-C  baclofen (LIORESAL) 10 MG tablet Take 1 tablet (10 mg total) by mouth 3 (three) times daily. 01/21/16  Yes Adalis Gatti, PA-C  Cholecalciferol (VITAMIN D3) 2000 UNITS capsule Take 2,000 Units by mouth daily.   Yes Historical Provider, MD  lamoTRIgine (LAMICTAL) 100 MG tablet Take 1 tablet (100 mg total) by mouth at bedtime. 01/21/16  Yes Kathrynne Kulinski, PA-C  natalizumab (TYSABRI) 300 MG/15ML injection Inject 15 mLs (300 mg total) into the vein every 30 (thirty) days. 12/12/15  Yes Charlynne Cousins, MD  oxybutynin (DITROPAN-XL) 10 MG 24 hr tablet take 1 tablet by mouth once daily for OVERACTIVE BLADDER 01/21/16  Yes Orval Dortch, PA-C  tobramycin-dexamethasone [facility] ophthalmic solution instill 1 drop into right eye four times a day 02/11/16  Yes Historical Provider, MD  traZODone (DESYREL) 100 MG tablet take 1 tablet by mouth at bedtime FOR SLEEP. 01/21/16  Yes Harrison Mons, PA-C  clonazePAM (KLONOPIN) 0.5 MG tablet Take 1 tablet (0.5 mg total) by mouth at bedtime as needed. For sleep Patient not taking: Reported on 01/21/2016 11/14/15   Domenic Polite, MD  modafinil (PROVIGIL) 200 MG tablet Take 1 tablet (200 mg total) by mouth daily. Patient not  taking: Reported on 02/18/2016 01/21/16   Harrison Mons, PA-C  Omega-3 Fatty Acids (OMEGA 3 500 PO) Take 500 mg by mouth 2 (two) times daily. Reported on 02/18/2016    Historical Provider, MD     No Active Allergies     Objective:  Physical Exam  Constitutional: She is oriented to person, place, and time. She appears well-developed and well-nourished. No distress.  BP 123/87 mmHg  Pulse 80  Temp(Src) 97.6 F (36.4 C)  Resp 16  Ht 5\' 1"  (1.549 m)  Wt 108 lb (48.988 kg)  BMI 20.42 kg/m2   Eyes: Conjunctivae are normal. No scleral icterus.  Neck: No thyromegaly present.  Cardiovascular: Normal rate, regular rhythm, normal heart sounds and intact distal pulses.   No LE edema  Pulmonary/Chest: Effort normal and breath sounds normal.  Lymphadenopathy:    She has no cervical adenopathy.  Neurological: She is alert and oriented to person, place, and time.  Skin: Skin is warm and dry.  Psychiatric: She has a normal mood and affect. Her speech is normal and behavior is normal.           Assessment & Plan:   1. Insomnia COntinue PRN trazodone on the nights she ahs trouble sleeping. - traZODone (DESYREL) 100 MG tablet; take 1 tablet by mouth at bedtime FOR SLEEP.  Dispense: 90 tablet; Refill: 3  2. Essential hypertension Controlled.  3. Multiple sclerosis ([place]) Continue follow-up with neurology.   Fara Chute, PA-C Physician Assistant-Certified [facility]

## 2016-02-25 NOTE — Telephone Encounter (Signed)
[name] has discussed this med w/pt at her 5/16 OV. Pt will f/up w/neurology.

## 2016-03-03 ENCOUNTER — Telehealth: Payer: Self-pay | Admitting: Neurology

## 2016-03-03 NOTE — Telephone Encounter (Signed)
Message For: O/C                  Taken 30-MAY-17 at  1:36PM by ATJ ------------------------------------------------------------  Ernesto Rutherford                CID  [id number]   Patient  SAME                  Pt's Dr  Felecia Shelling         Area Code  336  Phone#  [phone] *  DOB  [birth year]      RE  HAS MS, NEEDS AN RX SENT TO PHARMACY [facility] IN    PAIN                                                  Disp:Y/N  N  If Y = C/B If No Response In 88minutes  ============================================================

## 2016-03-04 NOTE — Telephone Encounter (Signed)
I have spoken with Wanda [name] this morning--she c/o increased multiple joint pain.  Appt. given to discuss, tomorrow morning at 0940, arrival time of 0930/fim

## 2016-03-05 ENCOUNTER — Encounter: Payer: Self-pay | Admitting: Neurology

## 2016-03-05 ENCOUNTER — Ambulatory Visit (INDEPENDENT_AMBULATORY_CARE_PROVIDER_SITE_OTHER): Payer: Medicare Other | Admitting: Neurology

## 2016-03-05 VITALS — BP 121/78 | HR 65 | Ht 61.0 in | Wt 111.0 lb

## 2016-03-05 DIAGNOSIS — R269 Unspecified abnormalities of gait and mobility: Secondary | ICD-10-CM

## 2016-03-05 DIAGNOSIS — R350 Frequency of micturition: Secondary | ICD-10-CM | POA: Diagnosis not present

## 2016-03-05 DIAGNOSIS — R5383 Other fatigue: Secondary | ICD-10-CM | POA: Diagnosis not present

## 2016-03-05 DIAGNOSIS — M7552 Bursitis of left shoulder: Secondary | ICD-10-CM | POA: Diagnosis not present

## 2016-03-05 DIAGNOSIS — G35 Multiple sclerosis: Secondary | ICD-10-CM | POA: Diagnosis not present

## 2016-03-05 DIAGNOSIS — M7551 Bursitis of right shoulder: Secondary | ICD-10-CM | POA: Insufficient documentation

## 2016-03-05 MED ORDER — BACLOFEN 10 MG PO TABS: ORAL_TABLET | ORAL | Status: DC

## 2016-03-05 NOTE — Progress Notes (Signed)
[facility]  PATIENT: Wanda [name] DOB: 07/08/1951  REFERRING DOCTOR OR PCP:  patient SOURCE: patient and records in EMR, labs/imaging reports, MRI images on PACS  _________________________________   HISTORICAL  CHIEF COMPLAINT:  Chief Complaint  Patient presents with  . Multiple Sclerosis    Room 12. She is still on Tysabri for therapy.  Says oxybutynin has been helpful for her bladder control.  Her main concerns today are continued muscle spasms (especially in the left arm) and increased joint pain (more severe in bilateral shoulders and right hand).    HISTORY OF PRESENT ILLNESS:  Wanda [name] is a 65 year old woman with a history of multiple sclerosis. She is on Tysabri and tolerates it well.   She denies any exacerbations.      Pain:   She is noticing more shoulder joint pain.   Pain increases with raising her arm up or putting on a shirt.  Gait/strength/sensation:    Gait is about the same with poor balance and right foot drop.   She is using a cane. Rehabilitation has reportedly told her that she should use a wheelchair more. She notes weakness and spasticity in her legs, right leg worse than her left. She has mild dysesthetic pain in the thighs in the hands. The hands are clumsy. Her legs also feel clumsy.  Bladder:  She reports urinary frequency and urgency but no incontinence. She also has hesitancy and she does not completely empty her bladder. She has to push on her abdomen to empty better. She usually does not have too much nocturia. Bowels are regular.              Vision:   She denies any significant vision problem due to the MS now but had diplopia in past  Fatigue/sleep: She has more fatigue again, worse with heat and as day goes on. She is sleeping much better with trazodone 50 mg at bedtime and has no hangover.  Mood/cognition: She currently denies any significant depression. She does note some anxiety. She had been prescribed lamotrigine  and she takes 100 mg nightly as a mood stabilizer  MS History:   In late 2014, she woke up one day unable to get out of bed due to weakness. There is on both sides of her body and she had clumsiness. MRI of the brain showed multiple plaques worrisome for MS. She had a follow-up contrasted MRI of the brain as well as the cervical spine showing multiple lesions, many of which enhance, including some in the infratentorial region. She was started on Tysabri as her first drug due to the aggressiveness of the multiple sclerosis. She has continued to be on Tysabri with monthly infusions. She is JCV antibody low positive with a reading of 0.58, last checked about 6 months ago. She was recently admitted to the hospital due to C. difficile. She became weaker during her infection. After being treated her strength and gait improved.    I personally reviewed her last MRI from 03/13/2015 showing multiple T2/FLAIR hyperintense foci, many of which are periventricular. She has some cortical atrophy. There is also a focus adjacent to C2 and some in the pons.   Recent lab results were also reviewed.   REVIEW OF SYSTEMS: Constitutional: No fevers, chills, sweats, or change in appetite.  She has fatigue and insomnia Eyes: No visual changes, double vision, eye pain Ear, nose and throat: No hearing loss, ear pain, nasal congestion, sore throat Cardiovascular: No chest pain, palpitations  Respiratory: No shortness of breath at rest or with exertion.   No wheezes GastrointestinaI: No nausea, vomiting, diarrhea, abdominal pain, fecal incontinence.   Recent C. difficile diarrhea Genitourinary: She has urinary hesitancy and does not completely empties the bladder. She also has urinary urgency and frequency. Musculoskeletal: No neck pain, back pain Integumentary: No rash, pruritus, skin lesions Neurological: as above Psychiatric: No depression at this time.  Some anxiety Endocrine: No palpitations, diaphoresis, change in  appetite, change in weigh or increased thirst Hematologic/Lymphatic: No anemia, purpura, petechiae. Allergic/Immunologic: No itchy/runny eyes, nasal congestion, recent allergic reactions, rashes  ALLERGIES: No Active Allergies  HOME MEDICATIONS:  Current outpatient prescriptions:  .  amLODipine (NORVASC) 5 MG tablet, take 1 tablet by mouth once daily FOR ESSENTIAL HYPERTENSION., Disp: 90 tablet, Rfl: 3 .  baclofen (LIORESAL) 10 MG tablet, Take 1 tablet (10 mg total) by mouth 3 (three) times daily., Disp: 90 each, Rfl: 5 .  Cholecalciferol (VITAMIN D3) 2000 UNITS capsule, Take 2,000 Units by mouth daily., Disp: , Rfl:  .  clonazePAM (KLONOPIN) 0.5 MG tablet, Take 1 tablet (0.5 mg total) by mouth at bedtime as needed. For sleep, Disp: 15 tablet, Rfl: 0 .  lamoTRIgine (LAMICTAL) 100 MG tablet, Take 1 tablet (100 mg total) by mouth at bedtime., Disp: 30 tablet, Rfl: 5 .  modafinil (PROVIGIL) 200 MG tablet, Take 1 tablet (200 mg total) by mouth daily., Disp: 30 tablet, Rfl: 5 .  natalizumab (TYSABRI) 300 MG/15ML injection, Inject 15 mLs (300 mg total) into the vein every 30 (thirty) days., Disp: 15 mL, Rfl:  .  Omega-3 Fatty Acids (OMEGA 3 500 PO), Take 500 mg by mouth 2 (two) times daily. Reported on 02/18/2016, Disp: , Rfl:  .  oxybutynin (DITROPAN-XL) 10 MG 24 hr tablet, take 1 tablet by mouth once daily for OVERACTIVE BLADDER, Disp: 90 tablet, Rfl: 3 .  tobramycin-dexamethasone (TOBRADEX) ophthalmic solution, instill 1 drop into right eye four times a day, Disp: , Rfl: 0 .  traZODone (DESYREL) 100 MG tablet, take 1 tablet by mouth at bedtime FOR SLEEP., Disp: 90 tablet, Rfl: 3  PAST MEDICAL HISTORY: Past Medical History  Diagnosis Date  . Elevated cholesterol   . Hypertension   . Arthritis   . Cervical dysplasia   . Depression   . Anxiety   . Macular degeneration of right eye 2001  . MS (multiple sclerosis) ([place]) 08/2013  . Acute encephalopathy 03/12/2015  . Anemia     PAST SURGICAL  HISTORY: Past Surgical History  Procedure Laterality Date  . Colposcopy    . Knee surgery Bilateral     "had cortisone injections in my knees"  . Cholecystectomy    . Tubal ligation    . Tonsillectomy    . Colonoscopy w/ polypectomy      FAMILY HISTORY: Family History  Problem Relation Age of Onset  . Cancer Mother     Colon  . Diabetes Mother   . Hypertension Mother   . Arthritis Mother   . Cancer Father     prostate  . Kidney disease Brother     congenital single kidney  . Arthritis Sister   . Arthritis Sister   . Hematuria Son   . Gout Brother   . Multiple sclerosis Brother   . Arthritis Brother   . HIV Brother   . Cancer Brother     spinal    SOCIAL HISTORY:  Social History   Social History  . Marital Status: Widowed  Spouse Name: N/A  . Number of Children: 2  . Years of Education: college   Occupational History  . retired     Social History Main Topics  . Smoking status: Former Smoker -- 1.00 packs/day for 45 years    Types: Cigarettes    Quit date: 10/23/2013  . Smokeless tobacco: Never Used     Comment: Quit smoking in 2014.  Marland Kitchen Alcohol Use: 0.0 oz/week    0 Standard drinks or equivalent per week     Comment: hasn't drank anything since jan 2015  . Drug Use: No  . Sexual Activity: No   Other Topics Concern  . Not on file   Social History Narrative   Grew up in [place] area, 1 of 10 siblings - 69 still living, married for 33 years then divorced, moved to [place] to be near daughter post retirement, als has 1 son. 2 dogs.       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      PHYSICAL EXAM  Filed Vitals:   03/05/16 0942  BP: 121/78  Pulse: 65  Height: 5\' 1"  (1.549 m)  Weight: 111 lb (50.349 kg)    Body mass index is 20.98 kg/(m^2).   General: The patient is well-developed and well-nourished and in no acute distress  Neck:  The neck is nontender.  Skin: Extremities are without rash or  edema.  Musculoskeletal:  Back is non-tender.    Tender over both subacromial bursa.   Mildly reduced ROM with elevation and external rotation.   Neurologic Exam  Mental status: The patient is alert and oriented x 3 at the time of the examination. The patient has apparent normal recent and remote memory, with an apparently normal attention span and concentration ability.   Speech is normal.  Cranial nerves: Extraocular movements are full. There is good facial sensation to soft touch bilaterally.Facial strength is normal.  Trapezius and sternocleidomastoid strength is normal. No dysarthria is noted.  The tongue is midline, and the patient has symmetric elevation of the soft palate. No obvious hearing deficits are noted.  Motor:  Muscle bulk is normal.   Tone is increased, right > left. Strength is  5 / 5 in all 4 extremities.   Sensory: Sensory testing show decreased right sided touch and vibration sensation.  Coordination: Cerebellar testing reveals reduced right finger-nose-finger and reduced right worse than left heel-to-shin bilaterally.  Gait and station: Station is normal.   Gait is wide and she has right foot drop. Cannot tandem. Romberg is positive.   Reflexes: Deep tendon reflexes are symmetric and normal bilaterally.       DIAGNOSTIC DATA (LABS, IMAGING, TESTING) - I reviewed patient records, labs, notes, testing and imaging myself where available.  Lab Results  Component Value Date   WBC 5.2 01/08/2016   HGB 11.4* 01/08/2016   HCT 35.6* 01/08/2016   MCV 85.2 01/08/2016   PLT 168 01/08/2016      Component Value Date/Time   NA 145 01/08/2016 1850   NA 136 08/10/2014 1304   K 4.1 01/08/2016 1850   CL 110 01/08/2016 1850   CO2 26 01/08/2016 1850   GLUCOSE 110* 01/08/2016 1850   GLUCOSE 90 08/10/2014 1304   BUN 8 01/08/2016 1850   BUN 15 08/10/2014 1304   CREATININE 1.09* 01/08/2016 1850   CREATININE 0.89 06/29/2013 1501   CALCIUM 9.2 01/08/2016 1850   PROT 6.4*  11/30/2015 1823   PROT 7.2  08/10/2014 1304   ALBUMIN 3.4* 11/30/2015 1823   ALBUMIN 4.5 08/10/2014 1304   AST 15 11/30/2015 1823   ALT 12* 11/30/2015 1823   ALKPHOS 63 11/30/2015 1823   BILITOT 0.7 11/30/2015 1823   GFRNONAA 52* 01/08/2016 1850   GFRAA >60 01/08/2016 1850   Lab Results  Component Value Date   CHOL 186 11/11/2015   HDL 58 11/11/2015   LDLCALC 115* 11/11/2015   TRIG 65 11/11/2015   CHOLHDL 3.2 11/11/2015   Lab Results  Component Value Date   HGBA1C 5.3 11/11/2015   Lab Results  Component Value Date   VITAMINB12 2060* 11/11/2015   Lab Results  Component Value Date   TSH 0.515 11/30/2015       ASSESSMENT AND PLAN  Multiple sclerosis ([place]) - Plan: baclofen (LIORESAL) 10 MG tablet  Urinary frequency  Other fatigue  Gait difficulty  Bilateral shoulder bursitis    1.    Continue Tysabri.  She is JCV Ab low titer positive (0.60) and will need to be checked for this q 6 months. 2.    Continue oxybutynin XL for bladder 3.    Baclofen 10 mg by mouth qid as 1-1-2 4.    Inject shoulders with 40 mg in 2.5 cc Marcaine into each shoulder using sterile technique.   She tolerated the procedure well.   Pain was much better afterwards. 5.    She will return to see me in 4 months or sooner if there are new or worsening neurologic symptoms.  Zamora Colton A. Felecia Shelling, MD, PhD [id number], [id number] AM Certified in Neurology, Clinical Neurophysiology, Sleep Medicine, Pain Medicine and Neuroimaging  [facility] [address] [place],  [address] [phone]

## 2016-03-13 DIAGNOSIS — G35 Multiple sclerosis: Secondary | ICD-10-CM | POA: Diagnosis not present

## 2016-04-14 DIAGNOSIS — G35 Multiple sclerosis: Secondary | ICD-10-CM | POA: Diagnosis not present

## 2016-04-16 ENCOUNTER — Telehealth: Payer: Self-pay

## 2016-04-16 NOTE — Telephone Encounter (Signed)
Pt is needing to talk with someone because [employer] states chelle is not in sys   Best [phone]

## 2016-04-20 ENCOUNTER — Ambulatory Visit: Payer: Medicare Other | Admitting: Neurology

## 2016-04-22 ENCOUNTER — Telehealth: Payer: Self-pay

## 2016-04-22 NOTE — Telephone Encounter (Signed)
Spoke with pt, she wants to see [name] but her insurance will not cover you as her PCP. She would like to know if you have anybody that you recommend. I advised her this is hard because we do not know who they will cover. I guess we can give her some names and then call her insurance to see  if they are covered.

## 2016-04-22 NOTE — Telephone Encounter (Signed)
Patient called on 04/16/2016  Requesting to speak with [name]. No one has returned her phone call. [employer] stated to patient that [name] is not listed in the network. Patient stated they gave her the name of Precious Haws. Please call patient at [phone].

## 2016-04-23 NOTE — Telephone Encounter (Signed)
Called pt, advised her of [name]'s message. Pt understood.

## 2016-04-23 NOTE — Telephone Encounter (Signed)
This is likely an issue with my not being credentialed with her insurance plan. [facility] is working to get all APPs credentialed with all payers, but it is labor intensive and will take a lot of time. We generally bill under our doctor's numbers, and she can see if Dr. Raul Del name is on the list (she is my new primary supervising physician since Dr. Laney Pastor has retired).  If not: The following providers at the following offices in our health system are open to new patients   [place] Dr. Parks Ranger Dr. Billey Gosling Dr. Azucena Freed, FNP  [facility] Dorothyann Peng, AGNP Dr. Garret Reddish  Dr. Betty Martinique   [facility] Dr. Penni Homans  Dr. Garnet Koyanagi Dr. Kathlene November Debbrah Alar, FNP Elyn Aquas, PA-C  Mackie Pai, PA-C  Dr. Lorenza Evangelist (starts in July 2017)  [place] Dr. Dimple Nanas  [facility] Dr. Crissie Sickles  Dr. Howard Pouch   [facility] Dr. Trevor Mace, AGNP Webb Silversmith, FNP  [facility] Dr. Thersa Salt  Dr. Tommi Rumps  Lorane Gell, AGNP

## 2016-04-28 ENCOUNTER — Encounter: Payer: Self-pay | Admitting: Physician Assistant

## 2016-04-28 ENCOUNTER — Ambulatory Visit (INDEPENDENT_AMBULATORY_CARE_PROVIDER_SITE_OTHER): Payer: Medicare Other | Admitting: Physician Assistant

## 2016-04-28 VITALS — BP 120/72 | HR 67 | Temp 97.9°F | Resp 17 | Ht 60.0 in | Wt 115.0 lb

## 2016-04-28 DIAGNOSIS — R351 Nocturia: Secondary | ICD-10-CM

## 2016-04-28 DIAGNOSIS — R8290 Unspecified abnormal findings in urine: Secondary | ICD-10-CM

## 2016-04-28 DIAGNOSIS — R739 Hyperglycemia, unspecified: Secondary | ICD-10-CM | POA: Diagnosis not present

## 2016-04-28 LAB — POCT URINALYSIS DIP (MANUAL ENTRY)
BILIRUBIN UA: NEGATIVE
Bilirubin, UA: NEGATIVE
Glucose, UA: NEGATIVE
Nitrite, UA: NEGATIVE
PH UA: 6.5
PROTEIN UA: NEGATIVE
RBC UA: NEGATIVE
SPEC GRAV UA: 1.015
UROBILINOGEN UA: 0.2

## 2016-04-28 LAB — POC MICROSCOPIC URINALYSIS (UMFC): Mucus: ABSENT

## 2016-04-28 LAB — COMPREHENSIVE METABOLIC PANEL
ALBUMIN: 4.7 g/dL (ref 3.6–5.1)
ALK PHOS: 60 U/L (ref 33–130)
ALT: 9 U/L (ref 6–29)
AST: 12 U/L (ref 10–35)
BILIRUBIN TOTAL: 0.5 mg/dL (ref 0.2–1.2)
BUN: 12 mg/dL (ref 7–25)
CHLORIDE: 107 mmol/L (ref 98–110)
CO2: 24 mmol/L (ref 20–31)
CREATININE: 1.07 mg/dL — AB (ref 0.50–0.99)
Calcium: 10 mg/dL (ref 8.6–10.4)
Glucose, Bld: 89 mg/dL (ref 65–99)
Potassium: 4.2 mmol/L (ref 3.5–5.3)
SODIUM: 141 mmol/L (ref 135–146)
TOTAL PROTEIN: 7.4 g/dL (ref 6.1–8.1)

## 2016-04-28 NOTE — Patient Instructions (Addendum)
Switch the oxybutynin to bedtime. If that doesn't help, let me know.    IF you received an x-ray today, you will receive an invoice from [facility]. Please contact [facility] at [phone] with questions or concerns regarding your invoice.   IF you received labwork today, you will receive an invoice from [employer]. Please contact [facility] at [phone] with questions or concerns regarding your invoice.   Our billing staff will not be able to assist you with questions regarding bills from these companies.  You will be contacted with the lab results as soon as they are available. The fastest way to get your results is to activate your My Chart account. Instructions are located on the last page of this paperwork. If you have not heard from [place] regarding the results in 2 weeks, please contact this office.    We recommend that you schedule a mammogram for breast cancer screening. Typically, you do not need a referral to do this. Please contact a local imaging center to schedule your mammogram.  [facility] - [phone]  *ask for the Radiology Department [facility] - [phone] or [phone]  [facility] - [phone] [place] [phone] [facility] - [phone]  *ask for the [facility] - [phone]  *ask for the Radiology Department [facility] - [phone]  *ask for the [place] - [phone]

## 2016-04-28 NOTE — Telephone Encounter (Signed)
Patient needed to be seen, but [employer] does not take effect until 05/05/2016.   All the other providers [name] has sent pt to would not be able to take new pt until 08/2016.   Patient is to call [employer] and let them they will be primary for July until [facility] takes effect.

## 2016-04-28 NOTE — Progress Notes (Signed)
Subjective:    Patient ID: Wanda [name], female    DOB: 16-Jul-1951, 65 y.o.   MRN: [id number]  HPI  Senetra presents today for evaluation of increased thirst and urine that "feels sticky". She is concerned she may have diabetes. Her mother had diabetes. Patient's medical history is significant for MS, HTN, and overactive bladder. She has been having nocturia at least 3x per night, fatigue, weight gain, numbness and weakness. Denies SOB, chest pain, and palpitations.   She also needs a refill of BP medication.   Review of Systems  Constitutional: Negative for appetite change and fever.       Gained some weight, attributes to diet.  HENT: Negative.   Eyes: Positive for visual disturbance.       Glaucoma in LEFT eye, ordering special contact for that eye.  Seeing an eye doctor regulary.  Gastrointestinal: Positive for constipation. Negative for abdominal pain, blood in stool, nausea and vomiting.       Takes dulcolax every other day for constipation. Heartburn, takes antacids. Attributes this to diet.   Genitourinary: Positive for frequency and urgency. Negative for difficulty urinating, dysuria and hematuria.       Urine malodorous and "smells like sulfur"  Musculoskeletal: Negative.   Skin: Negative.   Neurological: Negative for headaches.       BL numbness in hands, worse on the right. Decreased function in hands. (in the last 6 months) Right-sided weakness.   Hematological: Negative.   Psychiatric/Behavioral: Positive for sleep disturbance. Negative for dysphoric mood. The patient is not nervous/anxious.        Nocturia   Patient Active Problem List   Diagnosis Date Noted  . Urinary frequency 03/05/2016  . Bilateral shoulder bursitis 03/05/2016  . Other fatigue 12/18/2015  . Urinary hesitancy 12/18/2015  . Polypharmacy   . Cataract   . Right sided weakness   . Normocytic anemia 07/30/2015  . Hypokalemia   . Gait difficulty 08/10/2014  . Multiple sclerosis ([place])  10/25/2013  . Insomnia 03/23/2013  . Fibroids 12/29/2012  . Elevated cholesterol   . Hypertension   . Arthritis   . Cervical dysplasia      Current Outpatient Prescriptions:  .  baclofen (LIORESAL) 10 MG tablet, Take 1 by mouth every morning, 1 by mouth every afternoon, 2 by mouth daily at bedtime., Disp: 120 each, Rfl: 11 .  Cholecalciferol (VITAMIN D3) 2000 UNITS capsule, Take 2,000 Units by mouth daily., Disp: , Rfl:  .  lamoTRIgine (LAMICTAL) 100 MG tablet, Take 1 tablet (100 mg total) by mouth at bedtime., Disp: 30 tablet, Rfl: 5 .  natalizumab (TYSABRI) 300 MG/15ML injection, Inject 15 mLs (300 mg total) into the vein every 30 (thirty) days., Disp: 15 mL, Rfl:  .  Omega-3 Fatty Acids (OMEGA 3 500 PO), Take 500 mg by mouth 2 (two) times daily. Reported on 02/18/2016, Disp: , Rfl:  .  oxybutynin (DITROPAN-XL) 10 MG 24 hr tablet, take 1 tablet by mouth once daily for OVERACTIVE BLADDER, Disp: 90 tablet, Rfl: 3 .  traZODone (DESYREL) 100 MG tablet, take 1 tablet by mouth at bedtime FOR SLEEP., Disp: 90 tablet, Rfl: 3 .  amLODipine (NORVASC) 5 MG tablet, take 1 tablet by mouth once daily FOR ESSENTIAL HYPERTENSION. (Patient not taking: Reported on 04/28/2016), Disp: 90 tablet, Rfl: 3 .  clonazePAM (KLONOPIN) 0.5 MG tablet, Take 1 tablet (0.5 mg total) by mouth at bedtime as needed. For sleep (Patient not taking: Reported on 04/28/2016), Disp: 15 tablet,  Rfl: 0 .  modafinil (PROVIGIL) 200 MG tablet, Take 1 tablet (200 mg total) by mouth daily. (Patient not taking: Reported on 04/28/2016), Disp: 30 tablet, Rfl: 5 .  tobramycin-dexamethasone (TOBRADEX) ophthalmic solution, instill 1 drop into right eye four times a day, Disp: , Rfl: 0   No Known Allergies     Objective:   Physical Exam  Constitutional: She is oriented to person, place, and time. Vital signs are normal. She appears well-developed and well-nourished. She is cooperative. No distress.  Blood pressure 120/72, pulse 67, temperature  97.9 F (36.6 C), temperature source Oral, resp. rate 17, height 5' (1.524 m), weight 115 lb (52.2 kg), SpO2 97 %.  HENT:  Head: Normocephalic and atraumatic.  Eyes: Conjunctivae and lids are normal.  Neck: Trachea normal and normal range of motion. Neck supple. No thyromegaly present.  Cardiovascular: Normal rate, regular rhythm, normal heart sounds and intact distal pulses.   Pulmonary/Chest: Effort normal and breath sounds normal.  Abdominal: Soft. There is no tenderness.  Musculoskeletal:       Right shoulder: She exhibits decreased range of motion.       Left shoulder: She exhibits decreased range of motion.  Pain in BL shoulders limits raising arms  Lymphadenopathy:       Head (right side): No submental and no submandibular adenopathy present.       Head (left side): No submental and no submandibular adenopathy present.    She has no cervical adenopathy.  Neurological: She is alert and oriented to person, place, and time. No sensory deficit.  Right sided weakness in UE and LE  Skin: Skin is warm, dry and intact.  Psychiatric: She has a normal mood and affect. Her speech is normal and behavior is normal.        Assessment & Plan:  1. Abnormal urine 2. Nocturia - microscopic urinalysis - urine dipstick - CMET - recommend changing the oxybutynin 10 mg PO to bedtime  3. Hyperglycemia - CMET - Hgb A1c  Kelly Rayburn PA-S 04/28/2016

## 2016-04-28 NOTE — Progress Notes (Signed)
Patient ID: Wanda [name], female    DOB: 04-14-1951, 65 y.o.   MRN: [id number]  PCP: Harrison Mons, PA-C  Subjective:   Chief Complaint  Patient presents with  . Follow-up    diabetes    HPI Presents for evaluation of "sticky" urine, concerned that she may have diabetes.  Continues to get up 2-3 times at night to urinate, despite oxybutynin. Increased thirst. Urine odor is foul, like sulfur. "My mother had diabetes. She told me to get it checked out if I was ever thirsty a lot, and feeling sticky urine."  She does have overactive bladder, for which she takes oxybutynin, but still experiences nocturia x 3/per night. She also reports fatigue, weight gain, numbness and weakness.  No CP, SOB, HA, dizziness, nausea, vomiting, diarrhea. No leg pain.    Review of Systems Constitutional: Negative for appetite change and fever.       Gained some weight, attributes to diet.  HENT: Negative.   Eyes: Positive for visual disturbance.       Glaucoma in LEFT eye, ordering special contact for that eye.  Seeing an eye doctor regulary.  Gastrointestinal: Positive for constipation. Negative for abdominal pain, blood in stool, nausea and vomiting.       Takes dulcolax every other day for constipation. Heartburn, takes antacids. Attributes this to diet.   Genitourinary: Positive for frequency and urgency. Negative for difficulty urinating, dysuria and hematuria.       Urine malodorous and "smells like sulfur"  Musculoskeletal: Negative.   Skin: Negative.   Neurological: Negative for headaches.       BL numbness in hands, worse on the right. Decreased function in hands. (in the last 6 months) Right-sided weakness.   Hematological: Negative.   Psychiatric/Behavioral: Positive for sleep disturbance. Negative for dysphoric mood. The patient is not nervous/anxious.        Nocturia     Patient Active Problem List   Diagnosis Date Noted  . Urinary frequency 03/05/2016  . Bilateral  shoulder bursitis 03/05/2016  . Other fatigue 12/18/2015  . Urinary hesitancy 12/18/2015  . Polypharmacy   . Cataract   . Right sided weakness   . Normocytic anemia 07/30/2015  . Hypokalemia   . Gait difficulty 08/10/2014  . Multiple sclerosis ([place]) 10/25/2013  . Insomnia 03/23/2013  . Fibroids 12/29/2012  . Elevated cholesterol   . Hypertension   . Arthritis   . Cervical dysplasia      Prior to Admission medications   Medication Sig Start Date End Date Taking? Authorizing Provider  baclofen (LIORESAL) 10 MG tablet Take 1 by mouth every morning, 1 by mouth every afternoon, 2 by mouth daily at bedtime. 03/05/16  Yes Britt Bottom, MD  Cholecalciferol (VITAMIN D3) 2000 UNITS capsule Take 2,000 Units by mouth daily.   Yes Historical Provider, MD  lamoTRIgine (LAMICTAL) 100 MG tablet Take 1 tablet (100 mg total) by mouth at bedtime. 01/21/16  Yes Antionetta Ator, PA-C  natalizumab (TYSABRI) 300 MG/15ML injection Inject 15 mLs (300 mg total) into the vein every 30 (thirty) days. 12/12/15  Yes Charlynne Cousins, MD  Omega-3 Fatty Acids (OMEGA 3 500 PO) Take 500 mg by mouth 2 (two) times daily. Reported on 02/18/2016   Yes Historical Provider, MD  oxybutynin (DITROPAN-XL) 10 MG 24 hr tablet take 1 tablet by mouth once daily for OVERACTIVE BLADDER 01/21/16  Yes Grantley Savage, PA-C  traZODone (DESYREL) 100 MG tablet take 1 tablet by mouth at bedtime FOR SLEEP.  02/18/16  Yes Mabel Unrein, PA-C  amLODipine (NORVASC) 5 MG tablet take 1 tablet by mouth once daily FOR ESSENTIAL HYPERTENSION. Patient not taking: Reported on 04/28/2016 01/21/16   Harrison Mons, PA-C  clonazePAM (KLONOPIN) 0.5 MG tablet Take 1 tablet (0.5 mg total) by mouth at bedtime as needed. For sleep Patient not taking: Reported on 04/28/2016 11/14/15   Domenic Polite, MD  modafinil (PROVIGIL) 200 MG tablet Take 1 tablet (200 mg total) by mouth daily. Patient not taking: Reported on 04/28/2016 01/21/16   Harrison Mons, PA-C    tobramycin-dexamethasone [facility]) ophthalmic solution instill 1 drop into right eye four times a day 02/11/16   Historical Provider, MD     No Known Allergies     Objective:  Physical Exam  Constitutional: She is oriented to person, place, and time. She appears well-developed and well-nourished. She is active and cooperative. No distress.  BP 120/72 (BP Location: Right Arm, Patient Position: Sitting, Cuff Size: Normal)   Pulse 67   Temp 97.9 F (36.6 C) (Oral)   Resp 17   Ht 5' (1.524 m)   Wt 115 lb (52.2 kg)   SpO2 97%   BMI 22.46 kg/m   HENT:  Head: Normocephalic and atraumatic.  Right Ear: Hearing normal.  Left Ear: Hearing normal.  Eyes: Conjunctivae are normal. No scleral icterus.  Neck: Normal range of motion. Neck supple. No thyromegaly present.  Cardiovascular: Normal rate, regular rhythm and normal heart sounds.   Pulses:      Radial pulses are 2+ on the right side, and 2+ on the left side.  Pulmonary/Chest: Effort normal and breath sounds normal.  Musculoskeletal:       Right shoulder: She exhibits decreased range of motion.       Left shoulder: She exhibits decreased range of motion.  Lymphadenopathy:       Head (right side): No tonsillar, no preauricular, no posterior auricular and no occipital adenopathy present.       Head (left side): No tonsillar, no preauricular, no posterior auricular and no occipital adenopathy present.    She has no cervical adenopathy.       Right: No supraclavicular adenopathy present.       Left: No supraclavicular adenopathy present.  Neurological: She is alert and oriented to person, place, and time. No sensory deficit. Gait (ambulates with 1+ assist) abnormal.  RIGHT sided weakness, upper and lower extremities.  Skin: Skin is warm, dry and intact. No rash noted. No cyanosis or erythema. Nails show no clubbing.  Psychiatric: She has a normal mood and affect. Her speech is normal and behavior is normal.       Results for  orders placed or performed in visit on 04/28/16  POCT urinalysis dipstick  Result Value Ref Range   Color, UA yellow yellow   Clarity, UA clear clear   Glucose, UA negative negative   Bilirubin, UA negative negative   Ketones, POC UA negative negative   Spec Grav, UA 1.015    Blood, UA negative negative   pH, UA 6.5    Protein Ur, POC negative negative   Urobilinogen, UA 0.2    Nitrite, UA Negative Negative   Leukocytes, UA small (1+) (A) Negative  POCT Microscopic Urinalysis ([facility])  Result Value Ref Range   WBC,UR,HPF,POC Few (A) None WBC/hpf   RBC,UR,HPF,POC None None RBC/hpf   Bacteria Few (A) None, Too numerous to count   Mucus Absent Absent   Epithelial Cells, UR Per Microscopy Few (A)  None, Too numerous to count cells/hpf       Assessment & Plan:   1. Abnormal urine No evidence of infection or diabetes.  - POCT urinalysis dipstick - POCT Microscopic Urinalysis ([facility])  2. Nocturia more than twice per night Switch oxybutynin dose to HS.   3. Hyperglycemia Previous elevated glucose, unclear if she was fasting.  - Hemoglobin A1c - Comprehensive metabolic panel   Fara Chute, PA-C Physician Assistant-Certified [facility]

## 2016-04-29 LAB — HEMOGLOBIN A1C
Hgb A1c MFr Bld: 5.6 % (ref <5.7)
MEAN PLASMA GLUCOSE: 114 mg/dL

## 2016-04-30 ENCOUNTER — Encounter: Payer: Self-pay | Admitting: Physician Assistant

## 2016-05-12 DIAGNOSIS — G35 Multiple sclerosis: Secondary | ICD-10-CM | POA: Diagnosis not present

## 2016-06-09 DIAGNOSIS — G35 Multiple sclerosis: Secondary | ICD-10-CM | POA: Diagnosis not present

## 2016-06-16 ENCOUNTER — Ambulatory Visit (INDEPENDENT_AMBULATORY_CARE_PROVIDER_SITE_OTHER): Payer: Medicare HMO | Admitting: Neurology

## 2016-06-16 ENCOUNTER — Encounter: Payer: Self-pay | Admitting: Neurology

## 2016-06-16 ENCOUNTER — Encounter: Payer: Self-pay | Admitting: *Deleted

## 2016-06-16 ENCOUNTER — Other Ambulatory Visit: Payer: Self-pay | Admitting: *Deleted

## 2016-06-16 VITALS — BP 122/86 | HR 74 | Resp 6 | Ht 60.0 in | Wt 121.0 lb

## 2016-06-16 DIAGNOSIS — Z79899 Other long term (current) drug therapy: Secondary | ICD-10-CM

## 2016-06-16 DIAGNOSIS — M7552 Bursitis of left shoulder: Secondary | ICD-10-CM

## 2016-06-16 DIAGNOSIS — G5710 Meralgia paresthetica, unspecified lower limb: Secondary | ICD-10-CM | POA: Insufficient documentation

## 2016-06-16 DIAGNOSIS — G4700 Insomnia, unspecified: Secondary | ICD-10-CM | POA: Diagnosis not present

## 2016-06-16 DIAGNOSIS — G35 Multiple sclerosis: Secondary | ICD-10-CM | POA: Diagnosis not present

## 2016-06-16 DIAGNOSIS — R5383 Other fatigue: Secondary | ICD-10-CM

## 2016-06-16 DIAGNOSIS — R269 Unspecified abnormalities of gait and mobility: Secondary | ICD-10-CM

## 2016-06-16 DIAGNOSIS — R3911 Hesitancy of micturition: Secondary | ICD-10-CM

## 2016-06-16 DIAGNOSIS — M6289 Other specified disorders of muscle: Secondary | ICD-10-CM | POA: Diagnosis not present

## 2016-06-16 DIAGNOSIS — M7551 Bursitis of right shoulder: Secondary | ICD-10-CM | POA: Diagnosis not present

## 2016-06-16 DIAGNOSIS — G5712 Meralgia paresthetica, left lower limb: Secondary | ICD-10-CM

## 2016-06-16 DIAGNOSIS — R531 Weakness: Secondary | ICD-10-CM

## 2016-06-16 MED ORDER — TRAZODONE HCL 100 MG PO TABS: ORAL_TABLET | ORAL | 3 refills | Status: DC

## 2016-06-16 MED ORDER — OXYBUTYNIN CHLORIDE ER 10 MG PO TB24: ORAL_TABLET | ORAL | 3 refills | Status: DC

## 2016-06-16 NOTE — Progress Notes (Addendum)
[facility]  PATIENT: Wanda [name] DOB: December 29, 1950  REFERRING DOCTOR OR PCP:  patient SOURCE: patient and records in EMR, labs/imaging reports, MRI images on PACS  _________________________________   HISTORICAL  CHIEF COMPLAINT:  Chief Complaint  Patient presents with  . Multiple Sclerosis     Sts. she continues to tolerate Tysabri well.  Last JCV was positive at 0.60.  Sts. bilat shoulder pain is improved since inj. were given at last ov.  Sts. she would like to discuss increasing Oxybutynin and Trazodone to bid./fim    HISTORY OF PRESENT ILLNESS:  Wanda [name] is a 65 year old woman with a history of multiple sclerosis. She is on Tysabri and tolerates it well.   She denies any exacerbations.    However, she has noted an uncomfortable hand tingling and numbness day and night.  Gait/strength/sensation:    Gait is about the same with poor balance and right foot drop.   She is using a walker more than the cane now.  She furniture surfs at home.     She notes weakness and spasticity in her legs, right leg worse than her left. She has dysesthetic pain in the hands, R > L.   Her left anterolateral thigh has numbness and dysesthesia also.  The right thigh is fine. The hands are clumsy. Her legs also feel clumsy.    She had carpal tunnel releases in the past.   Bladder:  She reports urinary frequency and urgency is much better on oxybutynin biut 10 mg bid works even better. She denies hesitancy.   No recent UTI . She has less nocturia now. Bowels are regular.              Vision:   She denies any significant vision problem due to the MS now but had diplopia in past  Fatigue/sleep: She has more fatigue again, worse with heat and as day goes on. She is sleeping much better with trazodone 50 mg at bedtime and has no hangover.    She also often takes trazodone before a nap.     Mood/cognition: She currently denies any significant depression. She does note some anxiety. She  had been prescribed lamotrigine and she takes 100 mg nightly as a mood stabilizer  Shoulder Pain:   She is feeling much better since the bursa injections last visit.    Shoulder ROM also better   MS History:   In late 2014, she woke up one day unable to get out of bed due to weakness. There is on both sides of her body and she had clumsiness. MRI of the brain showed multiple plaques worrisome for MS. She had a follow-up contrasted MRI of the brain as well as the cervical spine showing multiple lesions, many of which enhance, including some in the infratentorial region. She was started on Tysabri as her first drug due to the aggressiveness of the multiple sclerosis. She has continued to be on Tysabri with monthly infusions. She is JCV antibody low positive with a reading of 0.58, last checked about 6 months ago. She was recently admitted to the hospital due to C. difficile. She became weaker during her infection. After being treated her strength and gait improved.    I personally reviewed her last MRI from 03/13/2015 showing multiple T2/FLAIR hyperintense foci, many of which are periventricular. She has some cortical atrophy. There is also a focus adjacent to C2 and some in the pons.   Recent lab results were also reviewed.  REVIEW OF SYSTEMS: Constitutional: No fevers, chills, sweats, or change in appetite.  She has fatigue and insomnia Eyes: No visual changes, double vision, eye pain Ear, nose and throat: No hearing loss, ear pain, nasal congestion, sore throat Cardiovascular: No chest pain, palpitations Respiratory: No shortness of breath at rest or with exertion.   No wheezes GastrointestinaI: No nausea, vomiting, diarrhea, abdominal pain, fecal incontinence.   Recent C. difficile diarrhea Genitourinary: She has urinary hesitancy and does not completely empties the bladder. She also has urinary urgency and frequency. Musculoskeletal: No neck pain, back pain Integumentary: No rash, pruritus,  skin lesions Neurological: as above Psychiatric: No depression at this time.  Some anxiety Endocrine: No palpitations, diaphoresis, change in appetite, change in weigh or increased thirst Hematologic/Lymphatic: No anemia, purpura, petechiae. Allergic/Immunologic: No itchy/runny eyes, nasal congestion, recent allergic reactions, rashes  ALLERGIES: No Known Allergies  HOME MEDICATIONS:  Current Outpatient Prescriptions:  .  baclofen (LIORESAL) 10 MG tablet, Take 1 by mouth every morning, 1 by mouth every afternoon, 2 by mouth daily at bedtime., Disp: 120 each, Rfl: 11 .  Cholecalciferol (VITAMIN D3) 2000 UNITS capsule, Take 2,000 Units by mouth daily., Disp: , Rfl:  .  lamoTRIgine (LAMICTAL) 100 MG tablet, Take 1 tablet (100 mg total) by mouth at bedtime., Disp: 30 tablet, Rfl: 5 .  natalizumab (TYSABRI) 300 MG/15ML injection, Inject 15 mLs (300 mg total) into the vein every 30 (thirty) days., Disp: 15 mL, Rfl:  .  Omega-3 Fatty Acids (OMEGA 3 500 PO), Take 500 mg by mouth 2 (two) times daily. Reported on 02/18/2016, Disp: , Rfl:  .  oxybutynin (DITROPAN-XL) 10 MG 24 hr tablet, take 2 tablets by mouth daily for OVERACTIVE BLADDER, Disp: 180 tablet, Rfl: 3 .  tobramycin-dexamethasone (TOBRADEX) ophthalmic solution, instill 1 drop into right eye four times a day, Disp: , Rfl: 0 .  traZODone (DESYREL) 100 MG tablet, take 1 to 1 1/2  pills a day for SLEEP., Disp: 135 tablet, Rfl: 3 .  amLODipine (NORVASC) 5 MG tablet, take 1 tablet by mouth once daily FOR ESSENTIAL HYPERTENSION. (Patient not taking: Reported on 04/28/2016), Disp: 90 tablet, Rfl: 3 .  clonazePAM (KLONOPIN) 0.5 MG tablet, Take 1 tablet (0.5 mg total) by mouth at bedtime as needed. For sleep (Patient not taking: Reported on 06/16/2016), Disp: 15 tablet, Rfl: 0 .  modafinil (PROVIGIL) 200 MG tablet, Take 1 tablet (200 mg total) by mouth daily. (Patient not taking: Reported on 06/16/2016), Disp: 30 tablet, Rfl: 5  PAST MEDICAL  HISTORY: Past Medical History:  Diagnosis Date  . Acute encephalopathy 03/12/2015  . Anemia   . Anxiety   . Arthritis   . Cervical dysplasia   . Depression   . Elevated cholesterol   . Hypertension   . Macular degeneration of right eye 2001  . MS (multiple sclerosis) ([place]) 08/2013    PAST SURGICAL HISTORY: Past Surgical History:  Procedure Laterality Date  . CHOLECYSTECTOMY    . COLONOSCOPY W/ POLYPECTOMY    . COLPOSCOPY    . KNEE SURGERY Bilateral    "had cortisone injections in my knees"  . TONSILLECTOMY    . TUBAL LIGATION      FAMILY HISTORY: Family History  Problem Relation Age of Onset  . Cancer Mother     Colon  . Diabetes Mother   . Hypertension Mother   . Arthritis Mother   . Cancer Father     prostate  . Kidney disease Brother  congenital single kidney  . Arthritis Sister   . Arthritis Sister   . Hematuria Son   . Gout Brother   . Multiple sclerosis Brother   . Arthritis Brother   . HIV Brother   . Cancer Brother     spinal    SOCIAL HISTORY:  Social History   Social History  . Marital status: Widowed    Spouse name: N/A  . Number of children: 2  . Years of education: college   Occupational History  . retired     Social History Main Topics  . Smoking status: Former Smoker    Packs/day: 1.00    Years: 45.00    Types: Cigarettes    Quit date: 10/23/2013  . Smokeless tobacco: Never Used     Comment: Quit smoking in 2014.  Marland Kitchen Alcohol use 0.0 oz/week     Comment: hasn't drank anything since jan 2015  . Drug use: No  . Sexual activity: No   Other Topics Concern  . Not on file   Social History Narrative   Grew up in [place] area, 1 of 10 siblings - 15 still living, married for 33 years then divorced, moved to [place] to be near daughter post retirement, als has 1 son. 2 dogs.       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      PHYSICAL EXAM  Vitals:   06/16/16 1037  BP: 122/86  Pulse: 74  Resp: (!) 6   Weight: 121 lb (54.9 kg)  Height: 5' (1.524 m)    Body mass index is 23.63 kg/m.   General: The patient is well-developed and well-nourished and in no acute distress  Neck:  The neck is nontender.  Skin: Extremities are without rash or edema.  Musculoskeletal:  Back is non-tender.    Now less tender over both subacromial bursa.  Improved ROM   Neurologic Exam  Mental status: The patient is alert and oriented x 3 at the time of the examination. The patient has apparent normal recent and remote memory, with an apparently normal attention span and concentration ability.   Speech is normal.  Cranial nerves: Extraocular movements are full. There is good facial sensation to soft touch bilaterally.Facial strength is normal.  Trapezius and sternocleidomastoid strength is normal. No dysarthria is noted.  The tongue is midline, and the patient has symmetric elevation of the soft palate. No obvious hearing deficits are noted.  Motor:  Muscle bulk is normal.   Tone is increased, right > left. Strength is  4/5 in right APB, 4+/5 in left APB and 5 / 5 in other muscles   Sensory: Sensory testing show decreased right sided touch and vibration sensation.  She reports numbness in the distribution of the left lateral femoral cutaneous nerve  Coordination: Cerebellar testing reveals reduced right finger-nose-finger and reduced right worse than left heel-to-shin bilaterally.  Gait and station: Station is normal.   Gait is wide and she has right foot drop. Cannot tandem. Romberg is positive.   Reflexes: Deep tendon reflexes are symmetric and normal bilaterally.    Other:   Positive right Tinel sign at the wrist.     DIAGNOSTIC DATA (LABS, IMAGING, TESTING) - I reviewed patient records, labs, notes, testing and imaging myself where available.  Lab Results  Component Value Date   WBC 5.2 01/08/2016   HGB 11.4 (L) 01/08/2016   HCT 35.6 (L) 01/08/2016   MCV 85.2 01/08/2016  PLT 168 01/08/2016       Component Value Date/Time   NA 141 04/28/2016 1612   NA 136 08/10/2014 1304   K 4.2 04/28/2016 1612   CL 107 04/28/2016 1612   CO2 24 04/28/2016 1612   GLUCOSE 89 04/28/2016 1612   BUN 12 04/28/2016 1612   BUN 15 08/10/2014 1304   CREATININE 1.07 (H) 04/28/2016 1612   CALCIUM 10.0 04/28/2016 1612   PROT 7.4 04/28/2016 1612   PROT 7.2 08/10/2014 1304   ALBUMIN 4.7 04/28/2016 1612   ALBUMIN 4.5 08/10/2014 1304   AST 12 04/28/2016 1612   ALT 9 04/28/2016 1612   ALKPHOS 60 04/28/2016 1612   BILITOT 0.5 04/28/2016 1612   GFRNONAA 52 (L) 01/08/2016 1850   GFRAA >60 01/08/2016 1850   Lab Results  Component Value Date   CHOL 186 11/11/2015   HDL 58 11/11/2015   LDLCALC 115 (H) 11/11/2015   TRIG 65 11/11/2015   CHOLHDL 3.2 11/11/2015   Lab Results  Component Value Date   HGBA1C 5.6 04/28/2016   Lab Results  Component Value Date   VITAMINB12 2,060 (H) 11/11/2015   Lab Results  Component Value Date   TSH 0.515 11/30/2015       ASSESSMENT AND PLAN  Multiple sclerosis ([place]) - Plan: Stratify JCV Antibody Test ([facility]), CBC with Differential/Platelet, CANCELED: CBC with Differential/Platelet  Insomnia - Plan: traZODone (DESYREL) 100 MG tablet  Urinary hesitancy - Plan: oxybutynin (DITROPAN-XL) 10 MG 24 hr tablet  Right sided weakness  Gait difficulty  Other fatigue  Bilateral shoulder bursitis  High risk medication use - Plan: CBC with Differential/Platelet  Lateral femoral cutaneous neuropathy, left    1.    Continue Tysabri.  She is JCV Ab low titer positive (0.60) and we will recheck the JCV Ab and CBC.   . 2.    Continue oxybutynin XL for bladder but increase to bid.  3.    Baclofen 10 mg by mouth 1-1-2 4.    Ok to take up to 1.5 pills/day trazodone for insomnia.   Don't take for nap as could have hangover later in evening 5.    She will return to see me in 4 months or sooner if there are new or worsening neurologic symptoms. 6.    Wear CTS  splints.   If pain not better, check NCV/EMG  40 minutes face-to-face evaluation with greater than one half of the time counseling correlating care about her MS and related symptoms.  Kelechi Astarita A. Felecia Shelling, MD, PhD [id number], [id number] PM Certified in Neurology, Clinical Neurophysiology, Sleep Medicine, Pain Medicine and Neuroimaging  [facility] [address], [place], [place] [address] [phone]

## 2016-06-17 LAB — CBC WITH DIFFERENTIAL/PLATELET
BASOS ABS: 0.1 10*3/uL (ref 0.0–0.2)
Basos: 1 %
EOS (ABSOLUTE): 0.3 10*3/uL (ref 0.0–0.4)
Eos: 6 %
HEMOGLOBIN: 12.9 g/dL (ref 11.1–15.9)
Hematocrit: 38.7 % (ref 34.0–46.6)
IMMATURE GRANS (ABS): 0 10*3/uL (ref 0.0–0.1)
Immature Granulocytes: 1 %
LYMPHS ABS: 2.3 10*3/uL (ref 0.7–3.1)
LYMPHS: 42 %
MCH: 27.6 pg (ref 26.6–33.0)
MCHC: 33.3 g/dL (ref 31.5–35.7)
MCV: 83 fL (ref 79–97)
MONOCYTES: 7 %
Monocytes Absolute: 0.4 10*3/uL (ref 0.1–0.9)
Neutrophils Absolute: 2.3 10*3/uL (ref 1.4–7.0)
Neutrophils: 43 %
PLATELETS: 212 10*3/uL (ref 150–379)
RBC: 4.67 x10E6/uL (ref 3.77–5.28)
RDW: 15.6 % — ABNORMAL HIGH (ref 12.3–15.4)
WBC: 5.3 10*3/uL (ref 3.4–10.8)

## 2016-06-23 ENCOUNTER — Encounter: Payer: Self-pay | Admitting: *Deleted

## 2016-06-30 ENCOUNTER — Ambulatory Visit (INDEPENDENT_AMBULATORY_CARE_PROVIDER_SITE_OTHER): Payer: Medicare HMO | Admitting: Physician Assistant

## 2016-06-30 ENCOUNTER — Encounter: Payer: Self-pay | Admitting: Physician Assistant

## 2016-06-30 VITALS — BP 118/74 | HR 70 | Temp 98.7°F | Resp 18 | Ht 60.0 in | Wt 120.0 lb

## 2016-06-30 DIAGNOSIS — E7800 Pure hypercholesterolemia, unspecified: Secondary | ICD-10-CM | POA: Diagnosis not present

## 2016-06-30 DIAGNOSIS — Z0000 Encounter for general adult medical examination without abnormal findings: Secondary | ICD-10-CM

## 2016-06-30 DIAGNOSIS — N879 Dysplasia of cervix uteri, unspecified: Secondary | ICD-10-CM | POA: Diagnosis not present

## 2016-06-30 DIAGNOSIS — Z1231 Encounter for screening mammogram for malignant neoplasm of breast: Secondary | ICD-10-CM

## 2016-06-30 DIAGNOSIS — Z1211 Encounter for screening for malignant neoplasm of colon: Secondary | ICD-10-CM

## 2016-06-30 DIAGNOSIS — D649 Anemia, unspecified: Secondary | ICD-10-CM

## 2016-06-30 DIAGNOSIS — E2839 Other primary ovarian failure: Secondary | ICD-10-CM

## 2016-06-30 DIAGNOSIS — G35 Multiple sclerosis: Secondary | ICD-10-CM | POA: Diagnosis not present

## 2016-06-30 DIAGNOSIS — I10 Essential (primary) hypertension: Secondary | ICD-10-CM | POA: Diagnosis not present

## 2016-06-30 LAB — COMPREHENSIVE METABOLIC PANEL
ALBUMIN: 5.1 g/dL (ref 3.6–5.1)
ALT: 16 U/L (ref 6–29)
AST: 14 U/L (ref 10–35)
Alkaline Phosphatase: 67 U/L (ref 33–130)
BUN: 14 mg/dL (ref 7–25)
CALCIUM: 10 mg/dL (ref 8.6–10.4)
CHLORIDE: 105 mmol/L (ref 98–110)
CO2: 26 mmol/L (ref 20–31)
CREATININE: 0.98 mg/dL (ref 0.50–0.99)
GLUCOSE: 90 mg/dL (ref 65–99)
Potassium: 4.5 mmol/L (ref 3.5–5.3)
SODIUM: 141 mmol/L (ref 135–146)
Total Bilirubin: 0.5 mg/dL (ref 0.2–1.2)
Total Protein: 8.1 g/dL (ref 6.1–8.1)

## 2016-06-30 LAB — LIPID PANEL
CHOL/HDL RATIO: 3 ratio (ref <=5.0)
Cholesterol: 286 mg/dL — ABNORMAL HIGH (ref 125–200)
HDL: 96 mg/dL (ref >=46)
LDL Cholesterol: 172 mg/dL — ABNORMAL HIGH (ref <130)
Triglycerides: 90 mg/dL (ref <150)
VLDL: 18 mg/dL (ref <30)

## 2016-06-30 MED ORDER — TOBRAMYCIN-DEXAMETHASONE 0.3-0.1 % OP SUSP: OPHTHALMIC | 2 refills | Status: DC

## 2016-06-30 NOTE — Patient Instructions (Addendum)
     IF you received an x-ray today, you will receive an invoice from [facility]. Please contact [facility] at [phone] with questions or concerns regarding your invoice.   IF you received labwork today, you will receive an invoice from [facility]. Please contact [facility] at [phone] with questions or concerns regarding your invoice.   Our billing staff will not be able to assist you with questions regarding bills from these companies.  You will be contacted with the lab results as soon as they are available. The fastest way to get your results is to activate your My Chart account. Instructions are located on the last page of this paperwork. If you have not heard from us regarding the results in 2 weeks, please contact this office.         IF you received an x-ray today, you will receive an invoice from [facility]. Please contact [facility] at [phone] with questions or concerns regarding your invoice.   IF you received labwork today, you will receive an invoice from [facility]. Please contact [facility] at [phone] with questions or concerns regarding your invoice.   Our billing staff will not be able to assist you with questions regarding bills from these companies.  You will be contacted with the lab results as soon as they are available. The fastest way to get your results is to activate your My Chart account. Instructions are located on the last page of this paperwork. If you have not heard from us regarding the results in 2 weeks, please contact this office.     

## 2016-06-30 NOTE — Progress Notes (Signed)
Presents today for Welcome to Medicare Visit.  Interpreter used for this visit? no  Patient Care Team: Wanda Mons, PA-C as PCP - General (Physician Assistant) [name], MD as Attending Physician (Neurology) Wanda Mink, MD as Consulting Physician (Ophthalmology) Britt Bottom, MD as Attending Physician (Neurology)    Other items to address today: 1. She needs a letter for hardship mail delivery (to allow delivery to her house, rather than her street-side mailbox) and; 2. Our office needs to contact [employer] to provide information related to her need for in home assistance.   Cancer Screening: Cervical: yes, cytology 12/29/2012 was NEGATIVE, cytology 12/29/2011 was NEGATIVE. She has a history of cervical dysplasia. Breast: Has not had this done, at least in the previous 4 years Colon: yes, 2009, NORMAL. Repeat in 2019  Prostate: n/a   Other Screening: Last screening for diabetes: 04/28/2016, glucose 89 Last lipid screening: 11/2015, LDL 115   ADVANCE DIRECTIVES: Discussed: yes On File: no Materials Provided: yes   Immunization status: up to date and documented, missing doses of seasonal influenza. The patient refuses this vaccine due to reported vaccine in approximately 2004.  Home Environment: Lives alone. Her daughter lives locally, but they do not always get along. Daughter has previously had POA, but due to their disagreements, she has switched this to her son.    Patient Active Problem List   Diagnosis Date Noted  . 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 Urinary frequency 03/05/2016  . Bilateral shoulder bursitis 03/05/2016  . Other fatigue 12/18/2015  . Urinary hesitancy 12/18/2015  . Polypharmacy   . Cataract   . Right sided weakness   . Normocytic anemia 07/30/2015  . Hypokalemia   . Gait difficulty 08/10/2014  . Multiple sclerosis ([place]) 10/25/2013  . Insomnia 03/23/2013  . Fibroids 12/29/2012  . Elevated cholesterol   .  Hypertension   . Arthritis   . Cervical dysplasia      Past Medical History:  Diagnosis Date  . Acute encephalopathy 03/12/2015  . Anemia   . Anxiety   . Arthritis   . Cervical dysplasia   . Depression   . Elevated cholesterol   . Hypertension   . Macular degeneration of right eye 2001  . MS (multiple sclerosis) ([place]) 08/2013     Past Surgical History:  Procedure Laterality Date  . CHOLECYSTECTOMY    . COLONOSCOPY W/ POLYPECTOMY    . COLPOSCOPY    . KNEE SURGERY Bilateral    "had cortisone injections in my knees"  . TONSILLECTOMY    . TUBAL LIGATION       Family History  Problem Relation Age of Onset  . Cancer Mother     Colon  . Diabetes Mother   . Hypertension Mother   . Arthritis Mother   . Cancer Father     prostate  . Kidney disease Brother     congenital single kidney  . Arthritis Sister   . Arthritis Sister   . Hematuria Son   . Gout Brother   . Multiple sclerosis Brother   . Arthritis Brother   . HIV Brother   . Cancer Brother     spinal     Social History   Social History  . Marital status: Widowed    Spouse name: N/A  . Number of children: 2  . Years of education: college   Occupational History  . retired     Social History Main Topics  . Smoking status: Former Smoker  Packs/day: 1.00    Years: 45.00    Types: Cigarettes    Quit date: 10/23/2013  . Smokeless tobacco: Never Used     Comment: Quit smoking in 2014.  Marland Kitchen Alcohol use 0.0 oz/week     Comment: hasn't drank anything since jan 2015  . Drug use: No  . Sexual activity: No   Other Topics Concern  . Not on file   Social History Narrative   Grew up in [place] area, 1 of 10 siblings - 61 still living, married for 33 years then divorced, moved to [place] to be near daughter post retirement, als has 1 son. 2 dogs.       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      No Known Allergies   Prior to Admission medications   Medication Sig Start Date  End Date Taking? Authorizing Provider  amLODipine (NORVASC) 5 MG tablet take 1 tablet by mouth once daily FOR ESSENTIAL HYPERTENSION. 01/21/16  Yes Diangelo Radel, PA-C  baclofen (LIORESAL) 10 MG tablet Take 1 by mouth every morning, 1 by mouth every afternoon, 2 by mouth daily at bedtime. 03/05/16  Yes Britt Bottom, MD  Cholecalciferol (VITAMIN D3) 2000 UNITS capsule Take 2,000 Units by mouth daily.   Yes Historical Provider, MD  lamoTRIgine (LAMICTAL) 100 MG tablet Take 1 tablet (100 mg total) by mouth at bedtime. 01/21/16  Yes Haeleigh Streiff, PA-C  natalizumab (TYSABRI) 300 MG/15ML injection Inject 15 mLs (300 mg total) into the vein every 30 (thirty) days. 12/12/15  Yes Charlynne Cousins, MD  Omega-3 Fatty Acids (OMEGA 3 500 PO) Take 500 mg by mouth 2 (two) times daily. Reported on 02/18/2016   Yes Historical Provider, MD  oxybutynin (DITROPAN-XL) 10 MG 24 hr tablet take 2 tablets by mouth daily for OVERACTIVE BLADDER 06/16/16  Yes Britt Bottom, MD  tobramycin-dexamethasone [facility]) ophthalmic solution instill 1 drop into right eye four times a day 06/30/16  Yes Haasini Patnaude, PA-C  traZODone (DESYREL) 100 MG tablet take 1 to 1 1/2  pills a day for SLEEP. 06/16/16  Yes Britt Bottom, MD     Depression screen [facility] 2/9 06/30/2016 06/30/2016 06/30/2016 02/18/2016 01/21/2016  Decreased Interest 1 0 0 0 0  Down, Depressed, Hopeless 0 0 0 0 0  PHQ - 2 Score 1 0 0 0 0     Fall Risk  06/30/2016 06/30/2016 06/30/2016 02/18/2016 01/21/2016  Falls in the past year? Yes No No Yes Yes  Number falls in past yr: 1 - - 2 or more 2 or more  Injury with Fall? Yes - - - Yes  Risk Factor Category  - - - - High Fall Risk  Risk for fall due to : History of fall(s);Impaired balance/gait;Impaired vision - - - -  Follow up Education provided - - - -     Functional Status Survey: Is the patient deaf or have difficulty hearing?: No Does the patient have difficulty seeing, even when wearing glasses/contacts?:  Yes Does the patient have difficulty concentrating, remembering, or making decisions?: No Does the patient have difficulty walking or climbing stairs?: Yes Does the patient have difficulty dressing or bathing?: No Does the patient have difficulty doing errands alone such as visiting a doctor's office or shopping?: No     ELECTROCARDIOGRAM: NSR. No ischemia or LVH.    PHYSICAL EXAM: BP 118/74 (BP Location: Right Arm, Patient Position: Sitting, Cuff Size: Small)   Pulse 70  Temp 98.7 F (37.1 C) (Oral)   Resp 18   Ht 5' (1.524 m)   Wt 120 lb (54.4 kg)   SpO2 99%   BMI 23.44 kg/m    Wt Readings from Last 3 Encounters:  06/30/16 120 lb (54.4 kg)  06/16/16 121 lb (54.9 kg)  04/28/16 115 lb (52.2 kg)      Hearing Screening Comments: Whisper test 10 ft in both ears    Physical Exam  Constitutional: She is oriented to person, place, and time. She appears well-developed and well-nourished. She is active and cooperative. No distress.  Ambulates with a walker today. BP 118/74 (BP Location: Right Arm, Patient Position: Sitting, Cuff Size: Small)   Pulse 70   Temp 98.7 F (37.1 C) (Oral)   Resp 18   Ht 5' (1.524 m)   Wt 120 lb (54.4 kg)   SpO2 99%   BMI 23.44 kg/m    HENT:  Head: Normocephalic and atraumatic.  Right Ear: Hearing, tympanic membrane, external ear and ear canal normal.  Left Ear: Hearing, tympanic membrane, external ear and ear canal normal.  Nose: Nose normal.  Mouth/Throat: Uvula is midline, oropharynx is clear and moist and mucous membranes are normal. No oral lesions. No uvula swelling. No oropharyngeal exudate.  Eyes: Conjunctivae, EOM and lids are normal. Pupils are equal, round, and reactive to light. Right eye exhibits no discharge. Left eye exhibits no discharge. No scleral icterus.  Fundoscopic exam:      The right eye shows no hemorrhage and no papilledema. The right eye shows red reflex.       The left eye shows no hemorrhage and no  papilledema. The left eye shows red reflex.  Neck: Normal range of motion, full passive range of motion without pain and phonation normal. Neck supple. No thyromegaly present.  Cardiovascular: Normal rate, regular rhythm, normal heart sounds and intact distal pulses.  Exam reveals no gallop and no friction rub.   No murmur heard. Respiratory: Effort normal and breath sounds normal. Right breast exhibits no inverted nipple, no mass, no nipple discharge, no skin change and no tenderness. Left breast exhibits no inverted nipple, no mass, no nipple discharge, no skin change and no tenderness. Breasts are symmetrical.  GI: Soft. Normal appearance and bowel sounds are normal. There is no hepatosplenomegaly. There is no tenderness. Hernia confirmed negative in the right inguinal area and confirmed negative in the left inguinal area.  Genitourinary: Vagina normal and uterus normal. No breast swelling, tenderness, discharge or bleeding. No labial fusion. There is no rash, tenderness, lesion or injury on the right labia. There is no rash, tenderness, lesion or injury on the left labia. Cervix exhibits no motion tenderness, no discharge and no friability. Right adnexum displays no mass, no tenderness and no fullness. Left adnexum displays no mass, no tenderness and no fullness.  Genitourinary Comments: Vaginal tissue is pale with decreased rugae. Cervical os is very stenotic.  Musculoskeletal:       Cervical back: Normal.       Thoracic back: Normal.       Lumbar back: Normal.  Lymphadenopathy:       Head (right side): No submandibular and no tonsillar adenopathy present.       Head (left side): No submandibular and no tonsillar adenopathy present.    She has no cervical adenopathy.       Right: No inguinal and no supraclavicular adenopathy present.       Left: No inguinal and no  supraclavicular adenopathy present.  Neurological: She is alert and oriented to person, place, and time. She has normal strength.  No cranial nerve deficit or sensory deficit.  Skin: Skin is warm and dry. No rash noted. She is not diaphoretic.  Psychiatric: She has a normal mood and affect. Her speech is normal and behavior is normal. Judgment and thought content normal. Cognition and memory are normal.     Education/Counseling: yes diet and exercise yes prevention of chronic diseases yes smoking/tobacco cessation yes review "Covered Medicare Preventive Services"    ASSESSMENT/PLAN: 1. Welcome to Medicare preventive visit Age appropriate anticipatory guidance provided. - EKG 12-Lead  2. Multiple sclerosis ([place]) Continue follow-up with neurology. Continue Tobradex PRN for blurry vision. - tobramycin-dexamethasone (TOBRADEX) ophthalmic solution; instill 1 drop into right eye four times a day  Dispense: 5 mL; Refill: 2  3. Elevated cholesterol Await labs. Adjust regimen as indicated by results. - Lipid panel - Comprehensive metabolic panel  4. Essential hypertension Controlled. - Comprehensive metabolic panel  5. Normocytic anemia Improved. Done 06/16/16  6. Encounter for screening mammogram for breast cancer Schedule mammogram.  7. Cervical dysplasia Unknown when previous dysplasia was noted, what abnormality was or how it was treated. If screening today is negative, with negative cytology in 2013 and 2014, would consider stopping screening. - Pap IG and HPV (high risk) DNA detection  8. Screening for colon cancer Colonoscopy 2009. Repeat 2019. Home hemosure annually until then. - POC Hemoccult Bld/Stl (3-Cd Home Screen); Future  9. Estrogen deficiency - DG Bone Density; Future   Fara Chute, PA-C Physician Assistant-Certified [facility]

## 2016-06-30 NOTE — Progress Notes (Signed)
Subjective:    Patient ID: Wanda [name], female    DOB: [birth year], 65 y.o.   MRN: [id number] Chief Complaint  Patient presents with  . Annual Exam    Patient just had eye appointment     HPI Patient is a 65 yo female with a hx of MS who presents today for a annual well exam. She reports tightness and aching pain in her arms and right leg. This pain happens occasionally but is brought on by activity. She also reports that her Provigil is too expensive and would like to know if there is anything else she could do to increase her energy levels.   Past Medical History:  Diagnosis Date  . Acute encephalopathy 03/12/2015  . Anemia   . Anxiety   . Arthritis   . Cervical dysplasia   . Depression   . Elevated cholesterol   . Hypertension   . Macular degeneration of right eye 2001  . MS (multiple sclerosis) ([place]) 08/2013   Family History  Problem Relation Age of Onset  . Cancer Mother     Colon  . Diabetes Mother   . Hypertension Mother   . Arthritis Mother   . Cancer Father     prostate  . Kidney disease Brother     congenital single kidney  . Arthritis Sister   . Arthritis Sister   . Hematuria Son   . Gout Brother   . Multiple sclerosis Brother   . Arthritis Brother   . HIV Brother   . Cancer Brother     spinal   Social History   Social History  . Marital status: Widowed    Spouse name: N/A  . Number of children: 2  . Years of education: college   Occupational History  . retired     Social History Main Topics  . Smoking status: Former Smoker    Packs/day: 1.00    Years: 45.00    Types: Cigarettes    Quit date: 10/23/2013  . Smokeless tobacco: Never Used     Comment: Quit smoking in 2014.  Marland Kitchen Alcohol use 0.0 oz/week     Comment: hasn't drank anything since jan 2015  . Drug use: No  . Sexual activity: No   Other Topics Concern  . Not on file   Social History Narrative   Grew up in [place] area, 1 of 10 siblings - 49 still living, married for 33 years then  divorced, moved to [place] to be near daughter post retirement, als has 1 son. 2 dogs.       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    Current Outpatient Prescriptions on File Prior to Visit  Medication Sig Dispense Refill  . amLODipine (NORVASC) 5 MG tablet take 1 tablet by mouth once daily FOR ESSENTIAL HYPERTENSION. 90 tablet 3  . baclofen (LIORESAL) 10 MG tablet Take 1 by mouth every morning, 1 by mouth every afternoon, 2 by mouth daily at bedtime. 120 each 11  . Cholecalciferol (VITAMIN D3) 2000 UNITS capsule Take 2,000 Units by mouth daily.    Marland Kitchen lamoTRIgine (LAMICTAL) 100 MG tablet Take 1 tablet (100 mg total) by mouth at bedtime. 30 tablet 5  . natalizumab (TYSABRI) 300 MG/15ML injection Inject 15 mLs (300 mg total) into the vein every 30 (thirty) days. 15 mL   . Omega-3 Fatty Acids (OMEGA 3 500 PO) Take 500 mg by mouth 2 (two) times daily. Reported on 02/18/2016    .  oxybutynin (DITROPAN-XL) 10 MG 24 hr tablet take 2 tablets by mouth daily for OVERACTIVE BLADDER 180 tablet 3  . traZODone (DESYREL) 100 MG tablet take 1 to 1 1/2  pills a day for SLEEP. 135 tablet 3   No current facility-administered medications on file prior to visit.     Review of Systems All review of systems are negative except those stated above    Objective:   Physical Exam  Constitutional: She appears well-developed and well-nourished. No distress.  HENT:  Head: Normocephalic and atraumatic.  Right Ear: External ear normal.  Left Ear: External ear normal.  Mouth/Throat: Oropharynx is clear and moist. No oropharyngeal exudate.  Eyes: Conjunctivae are normal. Pupils are equal, round, and reactive to light. Right eye exhibits no discharge. Left eye exhibits no discharge.  Neck: Neck supple. No thyromegaly present.  Cardiovascular: Normal rate, regular rhythm and intact distal pulses.  Exam reveals no gallop and no friction rub.   No murmur heard. Pulmonary/Chest: Breath sounds  normal. No respiratory distress. She exhibits no tenderness.  Abdominal: Soft. She exhibits no distension. There is no tenderness.  Genitourinary: Vagina normal. No vaginal discharge found.  Genitourinary Comments: Cervix is located very deep on the patient's left side   Lymphadenopathy:    She has no cervical adenopathy.  Neurological: She is alert. She displays normal reflexes.  Skin: Skin is warm and dry. She is not diaphoretic.      Assessment & Plan:  1. Welcome to Medicare preventive visit  - EKG 12-Lead  2. Multiple sclerosis ([place]) Stable, currently receives care from neurologist  - tobramycin-dexamethasone [facility]) ophthalmic solution; instill 1 drop into right eye four times a day  Dispense: 5 mL; Refill: 2  3. Elevated cholesterol  - Lipid panel - Comprehensive metabolic panel  4. Essential hypertension  - Comprehensive metabolic panel  5. Normocytic anemia   6. Encounter for screening mammogram for breast cancer   7. Cervical dysplasia  - Pap IG and HPV (high risk) DNA detection  8. Screening for colon cancer  - POC Hemoccult Bld/Stl (3-Cd Home Screen); Future  9. Estrogen deficiency  - DG Bone Density; Future

## 2016-07-01 LAB — PAP IG AND HPV HIGH-RISK: HPV DNA High Risk: NOT DETECTED

## 2016-07-02 ENCOUNTER — Telehealth: Payer: Self-pay

## 2016-07-02 NOTE — Telephone Encounter (Signed)
Returned call to patient.   We have attempted to find [facility] aid (PCS) and after numerous calls to [place] and numerous agencies the determining factor is that Medicare will not pay for any PCS ([place]) unless there is a skilled need.    Pt acknowledged she knew this but thought that if the MD called they would consider.  No, Medicare will not pay.  Pt will call [facility] and see if she can get some help through other agencies, or self pay.

## 2016-07-15 ENCOUNTER — Encounter: Payer: Self-pay | Admitting: Neurology

## 2016-07-15 ENCOUNTER — Ambulatory Visit (INDEPENDENT_AMBULATORY_CARE_PROVIDER_SITE_OTHER): Payer: Medicare HMO | Admitting: Neurology

## 2016-07-15 VITALS — BP 126/80 | HR 84 | Resp 18 | Ht 60.0 in | Wt 124.5 lb

## 2016-07-15 DIAGNOSIS — M7552 Bursitis of left shoulder: Secondary | ICD-10-CM

## 2016-07-15 DIAGNOSIS — M7551 Bursitis of right shoulder: Secondary | ICD-10-CM | POA: Diagnosis not present

## 2016-07-15 DIAGNOSIS — M25522 Pain in left elbow: Secondary | ICD-10-CM | POA: Insufficient documentation

## 2016-07-15 DIAGNOSIS — R350 Frequency of micturition: Secondary | ICD-10-CM | POA: Diagnosis not present

## 2016-07-15 DIAGNOSIS — R5383 Other fatigue: Secondary | ICD-10-CM

## 2016-07-15 DIAGNOSIS — E538 Deficiency of other specified B group vitamins: Secondary | ICD-10-CM | POA: Diagnosis not present

## 2016-07-15 DIAGNOSIS — R269 Unspecified abnormalities of gait and mobility: Secondary | ICD-10-CM

## 2016-07-15 DIAGNOSIS — G35 Multiple sclerosis: Secondary | ICD-10-CM

## 2016-07-15 NOTE — Progress Notes (Signed)
[facility]  PATIENT: Wanda [name] DOB: [birth year]  REFERRING DOCTOR OR PCP:  patient SOURCE: patient and records in EMR, labs/imaging reports, MRI images on PACS  _________________________________   HISTORICAL  CHIEF COMPLAINT:  Chief Complaint  Patient presents with  . Multiple Sclerosis    Sts. she continues to tolerate Tysabri well.  JCV ab was last checked 06-17-16 and was positive at 0.66.  Sts. bilat shoulder pain is worse, right worse than left.  She would like inj. today if appropriate/fim    HISTORY OF PRESENT ILLNESS:  Wanda [name] is a 65 year old woman with a history of multiple sclerosis. She is on Tysabri and tolerates it well.   She denies any exacerbations.    She has a lot more shoulder pain (bilateral) the past month.   She also reports left elbow pain.    Gait/strength/sensation:    Gait is unstable with poor balance and right foot drop.   She uses a walker.  She furniture surfs at home.     She notes weakness and spasticity in her legs, right leg worse than her left. She takes  Baclofen 20 mg and 20 mg at night.   She has dysesthetic pain in the hands, R > L, better with Lamotrigine.   Her left anterolateral thigh dysesthesia is much better.  The right thigh is fine. The hands are clumsy. Her legs also feel clumsy.    She had carpal tunnel releases in the past.   Bladder:  She has less urinary frequency and urgency on oxybutynin biut 10 mg bid works even better. She denies hesitancy.   No recent UTI . She has less nocturia now. Bowels are regular.              Vision:   She denies any significant vision problem due to the MS now but had diplopia in past  Fatigue/sleep: She has more fatigue again, worse with heat and as day goes on. She is sleeping much better with trazodone 50 mg at bedtime and has no hangover.    She also often takes trazodone before a nap.     Mood/cognition: She currently denies any significant depression. She does note  some anxiety. She had been prescribed lamotrigine and she takes 100 mg nightly as a mood stabilizer  Shoulder Pain:   She felt much better after the bursa injections for about 3-4 months.    Shoulder ROM also improved.   Over the past month, pain has returned.      Left Elbow Pain:  She also reports pain in the left elbow that is worse with certain movements. She does not note any change in arm strength or sensation.  MS History:   In late 2014, she woke up one day unable to get out of bed due to weakness. There is on both sides of her body and she had clumsiness. MRI of the brain showed multiple plaques worrisome for MS. She had a follow-up contrasted MRI of the brain as well as the cervical spine showing multiple lesions, many of which enhance, including some in the infratentorial region. She was started on Tysabri as her first drug due to the aggressiveness of the multiple sclerosis. She has continued to be on Tysabri with monthly infusions. She is JCV antibody low positive with a reading of 0.58, last checked about 6 months ago. She was recently admitted to the hospital due to C. difficile. She became weaker during her infection. After being treated  her strength and gait improved.    I personally reviewed her last MRI from 03/13/2015 showing multiple T2/FLAIR hyperintense foci, many of which are periventricular. She has some cortical atrophy. There is also a focus adjacent to C2 and some in the pons.   Recent lab results were also reviewed.   REVIEW OF SYSTEMS: Constitutional: No fevers, chills, sweats, or change in appetite.  She has fatigue and insomnia Eyes: No visual changes, double vision, eye pain Ear, nose and throat: No hearing loss, ear pain, nasal congestion, sore throat Cardiovascular: No chest pain, palpitations Respiratory: No shortness of breath at rest or with exertion.   No wheezes GastrointestinaI: No nausea, vomiting, diarrhea, abdominal pain, fecal incontinence.   Recent C.  difficile diarrhea Genitourinary: She has urinary hesitancy and does not completely empties the bladder. She also has urinary urgency and frequency. Musculoskeletal: No neck pain, back pain Integumentary: No rash, pruritus, skin lesions Neurological: as above Psychiatric: No depression at this time.  Some anxiety Endocrine: No palpitations, diaphoresis, change in appetite, change in weigh or increased thirst Hematologic/Lymphatic: No anemia, purpura, petechiae. Allergic/Immunologic: No itchy/runny eyes, nasal congestion, recent allergic reactions, rashes  ALLERGIES: No Known Allergies  HOME MEDICATIONS:  Current Outpatient Prescriptions:  .  amLODipine (NORVASC) 5 MG tablet, take 1 tablet by mouth once daily FOR ESSENTIAL HYPERTENSION., Disp: 90 tablet, Rfl: 3 .  baclofen (LIORESAL) 10 MG tablet, Take 1 by mouth every morning, 1 by mouth every afternoon, 2 by mouth daily at bedtime., Disp: 120 each, Rfl: 11 .  Cholecalciferol (VITAMIN D3) 2000 UNITS capsule, Take 2,000 Units by mouth daily., Disp: , Rfl:  .  lamoTRIgine (LAMICTAL) 100 MG tablet, Take 1 tablet (100 mg total) by mouth at bedtime., Disp: 30 tablet, Rfl: 5 .  natalizumab (TYSABRI) 300 MG/15ML injection, Inject 15 mLs (300 mg total) into the vein every 30 (thirty) days., Disp: 15 mL, Rfl:  .  Omega-3 Fatty Acids (OMEGA 3 500 PO), Take 500 mg by mouth 2 (two) times daily. Reported on 02/18/2016, Disp: , Rfl:  .  oxybutynin (DITROPAN-XL) 10 MG 24 hr tablet, take 2 tablets by mouth daily for OVERACTIVE BLADDER, Disp: 180 tablet, Rfl: 3 .  tobramycin-dexamethasone (TOBRADEX) ophthalmic solution, instill 1 drop into right eye four times a day, Disp: 5 mL, Rfl: 2 .  traZODone (DESYREL) 100 MG tablet, take 1 to 1 1/2  pills a day for SLEEP., Disp: 135 tablet, Rfl: 3  PAST MEDICAL HISTORY: Past Medical History:  Diagnosis Date  . Acute encephalopathy 03/12/2015  . Anemia   . Anxiety   . Arthritis   . Cervical dysplasia   .  Depression   . Elevated cholesterol   . Hypertension   . Macular degeneration of right eye 2001  . MS (multiple sclerosis) ([place]) 08/2013    PAST SURGICAL HISTORY: Past Surgical History:  Procedure Laterality Date  . CHOLECYSTECTOMY    . COLONOSCOPY W/ POLYPECTOMY    . COLPOSCOPY    . KNEE SURGERY Bilateral    "had cortisone injections in my knees"  . TONSILLECTOMY    . TUBAL LIGATION      FAMILY HISTORY: Family History  Problem Relation Age of Onset  . Cancer Mother     Colon  . Diabetes Mother   . Hypertension Mother   . Arthritis Mother   . Cancer Father     prostate  . Kidney disease Brother     congenital single kidney  . Arthritis Sister   .  Arthritis Sister   . Hematuria Son   . Gout Brother   . Multiple sclerosis Brother   . Arthritis Brother   . HIV Brother   . Cancer Brother     spinal    SOCIAL HISTORY:  Social History   Social History  . Marital status: Widowed    Spouse name: N/A  . Number of children: 2  . Years of education: college   Occupational History  . retired     Social History Main Topics  . Smoking status: Former Smoker    Packs/day: 1.00    Years: 45.00    Types: Cigarettes    Quit date: 10/23/2013  . Smokeless tobacco: Never Used     Comment: Quit smoking in 2014.  Marland Kitchen Alcohol use 0.0 oz/week     Comment: hasn't drank anything since jan 2015  . Drug use: No  . Sexual activity: No   Other Topics Concern  . Not on file   Social History Narrative   Grew up in [place] area, 1 of 10 siblings - 79 still living, married for 33 years then divorced, moved to [place] to be near daughter post retirement, als has 1 son. 2 dogs.       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      PHYSICAL EXAM  Vitals:   07/15/16 1147  BP: 126/80  Pulse: 84  Resp: 18  Weight: 124 lb 8 oz (56.5 kg)  Height: 5' (1.524 m)    Body mass index is 24.31 kg/m.   General: The patient is well-developed and  well-nourished and in no acute distress  Neck:  The neck is nontender.  Skin: Extremities are without rash or edema.  Musculoskeletal:  Back is non-tender.    Moderately tender over subacromial bursa and moderately tender over glenohumeral joints and left posterior elbow to palpation.    Neurologic Exam  Mental status: The patient is alert and oriented x 3 at the time of the examination. The patient has apparent normal recent and remote memory, with an apparently normal attention span and concentration ability.   Speech is normal.  Cranial nerves: Extraocular movements are full. There is good facial sensation to soft touch bilaterally.Facial strength is normal.  Trapezius and sternocleidomastoid strength is normal. No dysarthria is noted.  The tongue is midline, and the patient has symmetric elevation of the soft palate. No obvious hearing deficits are noted.  Motor:  Muscle bulk is normal.   Tone is increased, right > left. Strength is  4/5 in right APB, 4+/5 in left APB and 5 / 5 in other muscles   Sensory: Sensory testing show decreased right sided touch and vibration sensation.  She reports numbness in the distribution of the left lateral femoral cutaneous nerve  Coordination: Cerebellar testing reveals reduced right finger-nose-finger and reduced right worse than left heel-to-shin bilaterally.  Gait and station: Station is normal.   Gait is wide and she has right foot drop. Cannot tandem. Romberg is positive.   Reflexes: Deep tendon reflexes are symmetric and normal bilaterally.    Other:   Positive right Tinel sign at the wrist.     DIAGNOSTIC DATA (LABS, IMAGING, TESTING) - I reviewed patient records, labs, notes, testing and imaging myself where available.  Lab Results  Component Value Date   WBC 5.3 06/16/2016   HGB 11.4 (L) 01/08/2016   HCT 38.7 06/16/2016   MCV 83 06/16/2016   PLT 212  06/16/2016      Component Value Date/Time   NA 141 06/30/2016 1100   NA 136  08/10/2014 1304   K 4.5 06/30/2016 1100   CL 105 06/30/2016 1100   CO2 26 06/30/2016 1100   GLUCOSE 90 06/30/2016 1100   BUN 14 06/30/2016 1100   BUN 15 08/10/2014 1304   CREATININE 0.98 06/30/2016 1100   CALCIUM 10.0 06/30/2016 1100   PROT 8.1 06/30/2016 1100   PROT 7.2 08/10/2014 1304   ALBUMIN 5.1 06/30/2016 1100   ALBUMIN 4.5 08/10/2014 1304   AST 14 06/30/2016 1100   ALT 16 06/30/2016 1100   ALKPHOS 67 06/30/2016 1100   BILITOT 0.5 06/30/2016 1100   GFRNONAA 52 (L) 01/08/2016 1850   GFRAA >60 01/08/2016 1850   Lab Results  Component Value Date   CHOL 286 (H) 06/30/2016   HDL 96 06/30/2016   LDLCALC 172 (H) 06/30/2016   TRIG 90 06/30/2016   CHOLHDL 3.0 06/30/2016   Lab Results  Component Value Date   HGBA1C 5.6 04/28/2016   Lab Results  Component Value Date   VITAMINB12 2,060 (H) 11/11/2015   Lab Results  Component Value Date   TSH 0.515 11/30/2015       ASSESSMENT AND PLAN  Multiple sclerosis (HCC)  Gait difficulty  Other fatigue  Urinary frequency  Bilateral shoulder bursitis  Left elbow pain  B12 deficiency   1.    Continue Tysabri.  She is JCV Ab low titer positive (0.60) and we will recheck the JCV Ab and CBC.   . 2.    Inject bilateral subacromial bursae with a total of 60 mg in 5 mL Marcaine using sterile technique. She tolerated the procedure well.   There were no complications 3.    Inject the left elbow with 20 mg Depo-Medrol in 2.5 mL Marcaine using sterile technique. She tolerated the procedure well. There were no complications. 4.    Continue baclofen, oxybutynin and trazodone 5.    She will return to see me in 4 months or sooner if there are new or worsening neurologic symptoms. 6.    1 cc B12 IM  Richard A. Felecia Shelling, MD, PhD [id number], [id number] PM Certified in Neurology, Clinical Neurophysiology, Sleep Medicine, Pain Medicine and Neuroimaging  [facility] [address], [place], [place] [address] [phone]

## 2016-07-31 ENCOUNTER — Other Ambulatory Visit: Payer: Self-pay

## 2016-07-31 DIAGNOSIS — G35 Multiple sclerosis: Secondary | ICD-10-CM

## 2016-07-31 DIAGNOSIS — I10 Essential (primary) hypertension: Secondary | ICD-10-CM

## 2016-07-31 MED ORDER — AMLODIPINE BESYLATE 5 MG PO TABS
ORAL_TABLET | ORAL | 1 refills | Status: DC
Start: 2016-07-31 — End: 2017-02-22

## 2016-07-31 MED ORDER — LAMOTRIGINE 100 MG PO TABS: 100.0000 mg | ORAL_TABLET | Freq: Every day | ORAL | 1 refills | Status: DC

## 2016-08-04 ENCOUNTER — Telehealth: Payer: Self-pay | Admitting: *Deleted

## 2016-08-04 DIAGNOSIS — G4700 Insomnia, unspecified: Secondary | ICD-10-CM

## 2016-08-04 DIAGNOSIS — R3911 Hesitancy of micturition: Secondary | ICD-10-CM

## 2016-08-04 DIAGNOSIS — G35 Multiple sclerosis: Secondary | ICD-10-CM

## 2016-08-04 MED ORDER — BACLOFEN 10 MG PO TABS: ORAL_TABLET | ORAL | 3 refills | Status: DC

## 2016-08-04 MED ORDER — TRAZODONE HCL 100 MG PO TABS: ORAL_TABLET | ORAL | 3 refills | Status: DC

## 2016-08-04 MED ORDER — OXYBUTYNIN CHLORIDE ER 10 MG PO TB24: ORAL_TABLET | ORAL | 3 refills | Status: DC

## 2016-08-04 NOTE — Telephone Encounter (Signed)
Rx's escribed to [facility] per faxed request/fim

## 2016-08-06 ENCOUNTER — Ambulatory Visit (INDEPENDENT_AMBULATORY_CARE_PROVIDER_SITE_OTHER): Payer: Medicare HMO | Admitting: Internal Medicine

## 2016-08-06 ENCOUNTER — Encounter: Payer: Self-pay | Admitting: Internal Medicine

## 2016-08-06 VITALS — BP 108/74 | HR 77 | Temp 98.0°F | Ht 60.0 in | Wt 126.0 lb

## 2016-08-06 DIAGNOSIS — R269 Unspecified abnormalities of gait and mobility: Secondary | ICD-10-CM

## 2016-08-06 DIAGNOSIS — G35 Multiple sclerosis: Secondary | ICD-10-CM | POA: Diagnosis not present

## 2016-08-06 DIAGNOSIS — G4700 Insomnia, unspecified: Secondary | ICD-10-CM

## 2016-08-06 DIAGNOSIS — I10 Essential (primary) hypertension: Secondary | ICD-10-CM

## 2016-08-06 DIAGNOSIS — R3911 Hesitancy of micturition: Secondary | ICD-10-CM

## 2016-08-06 DIAGNOSIS — Z1231 Encounter for screening mammogram for malignant neoplasm of breast: Secondary | ICD-10-CM | POA: Diagnosis not present

## 2016-08-06 DIAGNOSIS — E7800 Pure hypercholesterolemia, unspecified: Secondary | ICD-10-CM

## 2016-08-06 DIAGNOSIS — R350 Frequency of micturition: Secondary | ICD-10-CM

## 2016-08-06 NOTE — Assessment & Plan Note (Signed)
She has never been on medication Elevated recently due to diet changes-she has not been compliant with a low fat/cholesterol diet We will decrease her fat, cholesterol and sugar intake We'll recheck in 6 months

## 2016-08-06 NOTE — Assessment & Plan Note (Signed)
Uses walker when she is out of her home, uses can around home, nothing needed inside home

## 2016-08-06 NOTE — Progress Notes (Signed)
Subjective:    Patient ID: Wanda [name], female    DOB: August 03, 1951, 65 y.o.   MRN: [id number]  HPI She is here to establish with a new pcp.     Hypertension: She is taking her medication daily. She is compliant with a low sodium diet.  She denies chest pain, palpitations, edema, shortness of breath and regular headaches.     Multiple sclerosis, spasms in hands: She takes Tysabri.  She takes baclofen and lamictal for spasms.  She follows with neurology. Overall she feels her multiple sclerosis is stable.  Gait difficulty: At home she walks without assistance. She will sometimes use a cane. When she is walking further distances she will use a walker.  Cholesterol: She has never been on medication. Her cholesterol was well controlled 8 months ago, but she has not been compliant with a low fat/cholesterol/sugar diet.  Urinary frequency/hesitancy: She takes the oxybutynin daily. She feels this medication does help.  Medications and allergies reviewed with patient and updated if appropriate.  Patient Active Problem List   Diagnosis Date Noted  . Left elbow pain 07/15/2016  . B12 deficiency 07/15/2016  . 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 Urinary frequency 03/05/2016  . Bilateral shoulder bursitis 03/05/2016  . Other fatigue 12/18/2015  . Urinary hesitancy 12/18/2015  . Polypharmacy   . Cataract   . Right sided weakness   . Normocytic anemia 07/30/2015  . Hypokalemia   . Gait difficulty 08/10/2014  . Multiple sclerosis ([place]) 10/25/2013  . Insomnia 03/23/2013  . Fibroids 12/29/2012  . Elevated cholesterol   . Hypertension   . Arthritis   . Cervical dysplasia     Current Outpatient Prescriptions on File Prior to Visit  Medication Sig Dispense Refill  . amLODipine (NORVASC) 5 MG tablet take 1 tablet by mouth once daily FOR ESSENTIAL HYPERTENSION. 90 tablet 1  . baclofen (LIORESAL) 10 MG tablet Take 1 by mouth every morning, 1 by mouth every afternoon, 2 by  mouth daily at bedtime. 360 each 3  . Cholecalciferol (VITAMIN D3) 2000 UNITS capsule Take 2,000 Units by mouth daily.    Marland Kitchen lamoTRIgine (LAMICTAL) 100 MG tablet Take 1 tablet (100 mg total) by mouth at bedtime. 90 tablet 1  . natalizumab (TYSABRI) 300 MG/15ML injection Inject 15 mLs (300 mg total) into the vein every 30 (thirty) days. 15 mL   . Omega-3 Fatty Acids (OMEGA 3 500 PO) Take 500 mg by mouth 2 (two) times daily. Reported on 02/18/2016    . oxybutynin (DITROPAN-XL) 10 MG 24 hr tablet take 2 tablets by mouth daily for OVERACTIVE BLADDER 180 tablet 3  . tobramycin-dexamethasone (TOBRADEX) ophthalmic solution instill 1 drop into right eye four times a day 5 mL 2  . traZODone (DESYREL) 100 MG tablet take 1 to 1 1/2  pills a day for SLEEP. 135 tablet 3   No current facility-administered medications on file prior to visit.     Past Medical History:  Diagnosis Date  . Acute encephalopathy 03/12/2015  . Anemia   . Anxiety   . Arthritis   . Cervical dysplasia   . Depression   . Elevated cholesterol   . Hypertension   . Macular degeneration of right eye 2001  . MS (multiple sclerosis) ([place]) 08/2013    Past Surgical History:  Procedure Laterality Date  . CHOLECYSTECTOMY    . COLONOSCOPY W/ POLYPECTOMY    . COLPOSCOPY    . KNEE SURGERY Bilateral    "had  cortisone injections in my knees"  . TONSILLECTOMY    . TUBAL LIGATION      Social History   Social History  . Marital status: Widowed    Spouse name: N/A  . Number of children: 2  . Years of education: college   Occupational History  . retired     Social History Main Topics  . Smoking status: Former Smoker    Packs/day: 1.00    Years: 45.00    Types: Cigarettes    Quit date: 10/23/2013  . Smokeless tobacco: Never Used     Comment: Quit smoking in 2014.  Marland Kitchen Alcohol use 0.0 oz/week     Comment: hasn't drank anything since jan 2015  . Drug use: No  . Sexual activity: No   Other Topics Concern  . Not on file    Social History Narrative   Grew up in [place] area, 1 of 10 siblings - 24 still living, married for 33 years then divorced, moved to [place] to be near daughter post retirement, als has 1 son. 2 dogs.       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     Family History  Problem Relation Age of Onset  . Cancer Mother     Colon  . Diabetes Mother   . Hypertension Mother   . Arthritis Mother   . Cancer Father     prostate  . Kidney disease Brother     congenital single kidney  . Arthritis Sister   . Arthritis Sister   . Hematuria Son   . Gout Brother   . Multiple sclerosis Brother   . Arthritis Brother   . HIV Brother   . Cancer Brother     spinal    Review of Systems  Constitutional: Positive for fatigue. Negative for appetite change, chills and fever.  Eyes: Negative for visual disturbance.  Respiratory: Negative for cough, shortness of breath and wheezing.   Cardiovascular: Positive for leg swelling (sometimes). Negative for chest pain and palpitations.  Gastrointestinal: Negative for abdominal pain, blood in stool, constipation, diarrhea and nausea.       Sometimes gerd - when eats the wrong thing  Genitourinary: Negative for dysuria and hematuria.  Musculoskeletal: Positive for arthralgias (left knee).  Neurological: Positive for weakness (from MS - right side) and numbness (right hand). Negative for dizziness, light-headedness and headaches.       Objective:   Vitals:   08/06/16 0940  BP: 108/74  Pulse: 77  Temp: 98 F (36.7 C)   Filed Weights   08/06/16 0940  Weight: 126 lb (57.2 kg)   Body mass index is 24.61 kg/m.   Physical Exam Constitutional: She appears well-developed and well-nourished. No distress.  HENT:  Head: Normocephalic and atraumatic.  Right Ear: External ear normal. Normal ear canal and TM Left Ear: External ear normal.  Normal ear canal and TM Mouth/Throat: Oropharynx is clear and moist.  Eyes: Conjunctivae  normal.  Neck: Neck supple. No tracheal deviation present. No thyromegaly present.  No carotid bruit  Cardiovascular: Normal rate, regular rhythm and normal heart sounds.   No murmur heard.  No edema. Pulmonary/Chest: Effort normal and breath sounds normal. No respiratory distress. She has no wheezes. She has no rales.  Abdominal: Soft. She exhibits no distension. There is no tenderness.  Lymphadenopathy: She has no cervical adenopathy.  Skin: Skin is warm and dry. She is not diaphoretic.  Psychiatric: She has a normal  mood and affect. Her behavior is normal.         Assessment & Plan:   See Problem List for Assessment and Plan of chronic medical problems.

## 2016-08-06 NOTE — Patient Instructions (Addendum)
  All other Health Maintenance issues reviewed.   All recommended immunizations and age-appropriate screenings are up-to-date or discussed.  No immunizations administered today.   Medications reviewed and updated.  No changes recommended at this time.  A mammogram was ordered.   Please followup in 6 months

## 2016-08-06 NOTE — Assessment & Plan Note (Signed)
Following with neurology Continue current treatment regimen

## 2016-08-06 NOTE — Assessment & Plan Note (Signed)
Controlled with trazodone nightly Continue

## 2016-08-06 NOTE — Assessment & Plan Note (Signed)
Continue oxybutynin

## 2016-08-06 NOTE — Assessment & Plan Note (Signed)
BP well controlled Current regimen effective and well tolerated Continue current medications at current doses  

## 2016-08-06 NOTE — Progress Notes (Signed)
Pre visit review using our clinic review tool, if applicable. No additional management support is needed unless otherwise documented below in the visit note. 

## 2016-08-19 ENCOUNTER — Encounter (INDEPENDENT_AMBULATORY_CARE_PROVIDER_SITE_OTHER): Payer: Self-pay | Admitting: Orthopedic Surgery

## 2016-08-19 ENCOUNTER — Telehealth (INDEPENDENT_AMBULATORY_CARE_PROVIDER_SITE_OTHER): Payer: Self-pay | Admitting: Radiology

## 2016-08-19 ENCOUNTER — Ambulatory Visit (INDEPENDENT_AMBULATORY_CARE_PROVIDER_SITE_OTHER): Payer: Medicare HMO | Admitting: Orthopedic Surgery

## 2016-08-19 VITALS — Ht 61.0 in | Wt 126.0 lb

## 2016-08-19 DIAGNOSIS — M1711 Unilateral primary osteoarthritis, right knee: Secondary | ICD-10-CM

## 2016-08-19 DIAGNOSIS — M1712 Unilateral primary osteoarthritis, left knee: Secondary | ICD-10-CM

## 2016-08-19 MED ORDER — LIDOCAINE HCL 1 % IJ SOLN
5.0000 mL | INTRAMUSCULAR | Status: AC | PRN
  Administered 2016-08-19: 5 mL

## 2016-08-19 MED ORDER — BUPIVACAINE HCL 0.25 % IJ SOLN
4.0000 mL | INTRAMUSCULAR | Status: AC | PRN
  Administered 2016-08-19: 4 mL via INTRA_ARTICULAR

## 2016-08-19 MED ORDER — METHYLPREDNISOLONE ACETATE 40 MG/ML IJ SUSP
40.0000 mg | INTRAMUSCULAR | Status: AC | PRN
  Administered 2016-08-19: 40 mg via INTRA_ARTICULAR

## 2016-08-19 NOTE — Telephone Encounter (Signed)
Apply for Euflexxa-

## 2016-08-19 NOTE — Progress Notes (Signed)
Office Visit Note   Patient: Wanda [name]           Date of Birth: 08/19/1951           MRN: [id number] Visit Date: 08/19/2016 Requested by: Binnie Rail, MD [place], [place] [address] PCP: Binnie Rail, MD  Subjective: Chief Complaint  Patient presents with  . Left Knee - Pain  . Right Knee - Pain    HPI Magin is a 65 year old patient with left greater than right knee pain she has multiple sclerosis.  She uses a walker.  Has known history of left knee arthritis.  This is her stronger leg which she depends upon.  She had Euflexxa injections last year which helped.  She now having gradual recurrence of the pain in the left knee but no real history of injury.              Review of Systems All systems reviewed are negative as they relate to the chief complaint within the history of present illness.  Patient denies  fevers or chills.    Assessment & Plan: Visit Diagnoses:  1. Arthritis of left knee   2. Arthritis of right knee     Plan: Impression is symptomatic left knee arthritis without effusion or ligamentous instability or mechanical symptoms.  Plan is injection today of cortisone.  We will pre-approve her for Euflexxa as well for when this injection wears off.  Continue with nonweightbearing quad strengthening exercises as able.  Follow-Up Instructions: No Follow-up on file.   Orders:  No orders of the defined types were placed in this encounter.  No orders of the defined types were placed in this encounter.     Procedures: Large Joint Inj Date/Time: 08/19/2016 2:39 PM Performed by: Meredith Pel Authorized by: Meredith Pel   Consent Given by:  Patient Site marked: the procedure site was marked   Timeout: prior to procedure the correct patient, procedure, and site was verified   Indications:  Pain, joint swelling and diagnostic evaluation Location:  Knee Prep: patient was prepped and draped in usual sterile fashion   Needle Size:   18 G Needle Length:  1.5 inches Approach:  Superolateral Ultrasound Guidance: No   Fluoroscopic Guidance: No   Arthrogram: No   Medications:  5 mL lidocaine 1 %; 4 mL bupivacaine 0.25 %; 40 mg methylPREDNISolone acetate 40 MG/ML Aspiration Attempted: No   Patient tolerance:  Patient tolerated the procedure well with no immediate complications     Clinical Data: No additional findings.  Objective: Vital Signs: Ht 5\' 1"  (1.549 m)   Wt 126 lb (57.2 kg)   BMI 23.81 kg/m   Physical Exam  Constitutional: She appears well-developed.  HENT:  Head: Normocephalic.  Eyes: EOM are normal.  Neck: Normal range of motion.  Cardiovascular: Normal rate.   Pulmonary/Chest: Effort normal.  Neurological: She is alert.  Skin: Skin is warm.  Psychiatric: She has a normal mood and affect.    Ortho Exam both knees are examined.  No effusion is present.  Quad strength is a little bit better on the left than the right.  Collateral patient's are stable.  No groin pain with internal/external rotation of the left-hand side.  Pedal pulses palpable.  No other masses lymph in either skin changes noted in the left leg region.  Range of motion is full.  Medial greater than lateral joint line tenderness is present  Specialty Comments:  No specialty  comments available.  Imaging: No results found.   PMFS History: Patient Active Problem List   Diagnosis Date Noted  . Left elbow pain 07/15/2016  . B12 deficiency 07/15/2016  . 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 Urinary frequency 03/05/2016  . Bilateral shoulder bursitis 03/05/2016  . Other fatigue 12/18/2015  . Urinary hesitancy 12/18/2015  . Polypharmacy   . Cataract   . Right sided weakness   . Gait difficulty 08/10/2014  . Multiple sclerosis ([facility]) 10/25/2013  . Insomnia 03/23/2013  . Fibroids 12/29/2012  . Elevated cholesterol   . Hypertension   . Arthritis   . Cervical dysplasia    Past Medical History:  Diagnosis Date    . Acute encephalopathy 03/12/2015  . Anemia   . Anxiety   . Arthritis   . Cervical dysplasia   . Depression   . Elevated cholesterol   . Hypertension   . Macular degeneration of right eye 2001  . MS (multiple sclerosis) ([facility]) 08/2013    Family History  Problem Relation Age of Onset  . Cancer Mother     Colon  . Diabetes Mother   . Hypertension Mother   . Arthritis Mother   . Cancer Father     prostate  . Kidney disease Brother     congenital single kidney  . Arthritis Sister   . Arthritis Sister   . Hematuria Son   . Gout Brother   . Multiple sclerosis Brother   . Arthritis Brother   . HIV Brother   . Cancer Brother     spinal    Past Surgical History:  Procedure Laterality Date  . CHOLECYSTECTOMY    . COLONOSCOPY W/ POLYPECTOMY    . COLPOSCOPY    . KNEE SURGERY Bilateral    "had cortisone injections in my knees"  . TONSILLECTOMY    . TUBAL LIGATION     Social History   Occupational History  . retired     Social History Main Topics  . Smoking status: Former Smoker    Packs/day: 1.00    Years: 45.00    Types: Cigarettes    Quit date: 10/23/2013  . Smokeless tobacco: Never Used     Comment: Quit smoking in 2014.  Marland Kitchen Alcohol use No     Comment: hasn't drank anything since jan 2015  . Drug use: No  . Sexual activity: No

## 2016-09-03 NOTE — Telephone Encounter (Signed)
followup on Euflexxa- faxed for VOB today

## 2016-09-10 ENCOUNTER — Other Ambulatory Visit: Payer: Self-pay | Admitting: Internal Medicine

## 2016-09-10 DIAGNOSIS — Z1231 Encounter for screening mammogram for malignant neoplasm of breast: Secondary | ICD-10-CM

## 2016-09-15 ENCOUNTER — Telehealth: Payer: Self-pay | Admitting: *Deleted

## 2016-09-15 NOTE — Telephone Encounter (Signed)
I have spoken with Wanda [name].  She c/o worsening right shoulder pain, req. inj.  Appt. given tomorrow 3pm/fim

## 2016-09-15 NOTE — Telephone Encounter (Signed)
Patient called requesting appointment with Dr Felecia Shelling for a "shot for my bursitis". She stated he has done these injections for her in the past. She requested RN call her to schedule appointment.

## 2016-09-16 ENCOUNTER — Ambulatory Visit (INDEPENDENT_AMBULATORY_CARE_PROVIDER_SITE_OTHER): Payer: Medicare HMO | Admitting: Neurology

## 2016-09-16 ENCOUNTER — Encounter: Payer: Self-pay | Admitting: Neurology

## 2016-09-16 VITALS — BP 126/74 | HR 78 | Resp 20 | Ht 61.0 in | Wt 129.0 lb

## 2016-09-16 DIAGNOSIS — G35 Multiple sclerosis: Secondary | ICD-10-CM | POA: Diagnosis not present

## 2016-09-16 DIAGNOSIS — R531 Weakness: Secondary | ICD-10-CM

## 2016-09-16 DIAGNOSIS — M7551 Bursitis of right shoulder: Secondary | ICD-10-CM

## 2016-09-16 DIAGNOSIS — M7552 Bursitis of left shoulder: Secondary | ICD-10-CM | POA: Diagnosis not present

## 2016-09-16 DIAGNOSIS — M5431 Sciatica, right side: Secondary | ICD-10-CM | POA: Diagnosis not present

## 2016-09-16 DIAGNOSIS — R269 Unspecified abnormalities of gait and mobility: Secondary | ICD-10-CM

## 2016-09-16 NOTE — Progress Notes (Signed)
[facility]  PATIENT: Wanda [name] DOB: 10-Aug-1951  REFERRING DOCTOR OR PCP:  patient SOURCE: patient and records in EMR, labs/imaging reports, MRI images on PACS  _________________________________   HISTORICAL  CHIEF COMPLAINT:  Chief Complaint  Patient presents with  . Multiple Sclerosis    Sts. she continues to tolerate Tysabri well.  JCV ab last checked 06-17-16, and was positive at 0.66.  Sts. right shoulder pain is worse--she would like a tpi today if possible.  She also c/o increased rifght lower back/buttock pain/fim    HISTORY OF PRESENT ILLNESS:  Wanda [name] is a 65 year old woman with a history of multiple sclerosis.   MS:   She is on Tysabri and tolerates it well.   She denies any exacerbations.   She is JCV Ab low positive (0.66).   She has a lot more shoulder pain (bilateral) the past month.   She also reports left elbow pain.    Shoulder Pain:   She has had more right shoulder pain the past week.    In the past, she received a good benefit from a subacromial bursa injection.  Pan increases with raisin the shoulder and externally rotation.   Left shoulder is much better since the injection at the last visit.     Left elbow is also better since that injection.     Right buttock pain:   She also reports more pain in th right buttock x 2 weeks.   Pain radiate into the back of the leg  Pain is worse with prolonged sitting.    Gait/strength/sensation:    Gait is stable.   She notes poor balance and right foot drop and uses a walker.  She furniture surfs at home.     She notes weakness and spasticity in her legs, right leg worse than her left. She takes  Baclofen 20 mg and 20 mg at night.   She has dysesthetic pain in the hands, R > L, better with Lamotrigine.   Her left anterolateral thigh dysesthesia is much better.     Bladder:  She has less urinary frequency and urgency on oxybutynin  Vision:   She denies any significant vision problem due to the  MS now but had diplopia in past  Fatigue/sleep: She has more fatigue again, worse with heat and as day goes on. She is sleeping much better with trazodone 50 mg at bedtime and has no hangover.    She also often takes trazodone before a nap.     She has slept poorly x 2 nights due to her pain.   Mood/cognition: She currently denies any significant depression. She does note some anxiety. She had been prescribed lamotrigine and she takes 100 mg nightly as a mood stabilizer   MS History:   In late 2014, she woke up one day unable to get out of bed due to weakness. There is on both sides of her body and she had clumsiness. MRI of the brain showed multiple plaques worrisome for MS. She had a follow-up contrasted MRI of the brain as well as the cervical spine showing multiple lesions, many of which enhance, including some in the infratentorial region. She was started on Tysabri as her first drug due to the aggressiveness of the multiple sclerosis. She has continued to be on Tysabri with monthly infusions. She is JCV antibody low positive.     I personally reviewed her last MRI from 03/13/2015 showing multiple T2/FLAIR hyperintense foci, many of which  are periventricular. She has some cortical atrophy. There is also a focus adjacent to C2 and some in the pons.   Recent lab results were also reviewed.   REVIEW OF SYSTEMS: Constitutional: No fevers, chills, sweats, or change in appetite.  She has fatigue and insomnia Eyes: No visual changes, double vision, eye pain Ear, nose and throat: No hearing loss, ear pain, nasal congestion, sore throat Cardiovascular: No chest pain, palpitations Respiratory: No shortness of breath at rest or with exertion.   No wheezes GastrointestinaI: No nausea, vomiting, diarrhea, abdominal pain, fecal incontinence.   Recent C. difficile diarrhea Genitourinary: She has urinary hesitancy and does not completely empties the bladder. She also has urinary urgency and  frequency. Musculoskeletal: No neck pain, back pain Integumentary: No rash, pruritus, skin lesions Neurological: as above Psychiatric: No depression at this time.  Some anxiety Endocrine: No palpitations, diaphoresis, change in appetite, change in weigh or increased thirst Hematologic/Lymphatic: No anemia, purpura, petechiae. Allergic/Immunologic: No itchy/runny eyes, nasal congestion, recent allergic reactions, rashes  ALLERGIES: No Known Allergies  HOME MEDICATIONS:  Current Outpatient Prescriptions:  .  amLODipine (NORVASC) 5 MG tablet, take 1 tablet by mouth once daily FOR ESSENTIAL HYPERTENSION., Disp: 90 tablet, Rfl: 1 .  baclofen (LIORESAL) 10 MG tablet, Take 1 by mouth every morning, 1 by mouth every afternoon, 2 by mouth daily at bedtime., Disp: 360 each, Rfl: 3 .  Cholecalciferol (VITAMIN D3) 2000 UNITS capsule, Take 2,000 Units by mouth daily., Disp: , Rfl:  .  lamoTRIgine (LAMICTAL) 100 MG tablet, Take 1 tablet (100 mg total) by mouth at bedtime., Disp: 90 tablet, Rfl: 1 .  natalizumab (TYSABRI) 300 MG/15ML injection, Inject 15 mLs (300 mg total) into the vein every 30 (thirty) days., Disp: 15 mL, Rfl:  .  Omega-3 Fatty Acids (OMEGA 3 500 PO), Take 500 mg by mouth 2 (two) times daily. Reported on 02/18/2016, Disp: , Rfl:  .  oxybutynin (DITROPAN-XL) 10 MG 24 hr tablet, take 2 tablets by mouth daily for OVERACTIVE BLADDER, Disp: 180 tablet, Rfl: 3 .  tobramycin-dexamethasone (TOBRADEX) ophthalmic solution, instill 1 drop into right eye four times a day, Disp: 5 mL, Rfl: 2 .  traZODone (DESYREL) 100 MG tablet, take 1 to 1 1/2  pills a day for SLEEP., Disp: 135 tablet, Rfl: 3  PAST MEDICAL HISTORY: Past Medical History:  Diagnosis Date  . Acute encephalopathy 03/12/2015  . Anemia   . Anxiety   . Arthritis   . Cervical dysplasia   . Depression   . Elevated cholesterol   . Hypertension   . Macular degeneration of right eye 2001  . MS (multiple sclerosis) ([facility]) 08/2013     PAST SURGICAL HISTORY: Past Surgical History:  Procedure Laterality Date  . CHOLECYSTECTOMY    . COLONOSCOPY W/ POLYPECTOMY    . COLPOSCOPY    . KNEE SURGERY Bilateral    "had cortisone injections in my knees"  . TONSILLECTOMY    . TUBAL LIGATION      FAMILY HISTORY: Family History  Problem Relation Age of Onset  . Cancer Mother     Colon  . Diabetes Mother   . Hypertension Mother   . Arthritis Mother   . Cancer Father     prostate  . Kidney disease Brother     congenital single kidney  . Arthritis Sister   . Arthritis Sister   . Hematuria Son   . Gout Brother   . Multiple sclerosis Brother   . Arthritis Brother   .  HIV Brother   . Cancer Brother     spinal    SOCIAL HISTORY:  Social History   Social History  . Marital status: Widowed    Spouse name: N/A  . Number of children: 2  . Years of education: college   Occupational History  . retired     Social History Main Topics  . Smoking status: Former Smoker    Packs/day: 1.00    Years: 45.00    Types: Cigarettes    Quit date: 10/23/2013  . Smokeless tobacco: Never Used     Comment: Quit smoking in 2014.  Marland Kitchen Alcohol use No     Comment: hasn't drank anything since jan 2015  . Drug use: No  . Sexual activity: No   Other Topics Concern  . Not on file   Social History Narrative   Grew up in [place] area, 1 of 10 siblings - 69 still living, married for 33 years then divorced, moved to [place] to be near daughter post retirement, als has 1 son. 2 dogs.       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      PHYSICAL EXAM  Vitals:   09/16/16 1514  BP: 126/74  Pulse: 78  Resp: 20  Weight: 129 lb (58.5 kg)  Height: 5\' 1"  (1.549 m)    Body mass index is 24.37 kg/m.   General: The patient is well-developed and well-nourished and in no acute distress  Neck:  The neck is nontender.   Musculoskeletal:  Back is tender over right piriformis muscle.    Moderately tender  over right subacromial bursa .    Neurologic Exam  Mental status: The patient is alert and oriented x 3 at the time of the examination. The patient has apparent normal recent and remote memory, with an apparently normal attention span and concentration ability.   Speech is normal.  Cranial nerves: Extraocular movements are full.Facial strength is normal.  Trapezius and sternocleidomastoid strength is normal. No dysarthria is noted.  The tongue is midline, and the patient has symmetric elevation of the soft palate. No obvious hearing deficits are noted.  Motor:  Muscle bulk is normal.   Tone is increased, right > left. Strength is  4/5 in right APB, 4+/5 in left APB and 5 / 5 in other muscles   Sensory: Sensory testing show decreased right sided touch and vibration sensation.  She reports numbness in the distribution of the left lateral femoral cutaneous nerve  Coordination: Cerebellar testing reveals reduced right finger-nose-finger  Gait and station: Station is normal.   Gait is wide and she has right foot drop. Cannot tandem. Romberg is positive.   Reflexes: Deep tendon reflexes are symmetric and normal bilaterally.       DIAGNOSTIC DATA (LABS, IMAGING, TESTING) - I reviewed patient records, labs, notes, testing and imaging myself where available.  Lab Results  Component Value Date   WBC 5.3 06/16/2016   HGB 11.4 (L) 01/08/2016   HCT 38.7 06/16/2016   MCV 83 06/16/2016   PLT 212 06/16/2016      Component Value Date/Time   NA 141 06/30/2016 1100   NA 136 08/10/2014 1304   K 4.5 06/30/2016 1100   CL 105 06/30/2016 1100   CO2 26 06/30/2016 1100   GLUCOSE 90 06/30/2016 1100   BUN 14 06/30/2016 1100   BUN 15 08/10/2014 1304   CREATININE 0.98 06/30/2016 1100   CALCIUM 10.0 06/30/2016 1100  PROT 8.1 06/30/2016 1100   PROT 7.2 08/10/2014 1304   ALBUMIN 5.1 06/30/2016 1100   ALBUMIN 4.5 08/10/2014 1304   AST 14 06/30/2016 1100   ALT 16 06/30/2016 1100   ALKPHOS 67  06/30/2016 1100   BILITOT 0.5 06/30/2016 1100   GFRNONAA 52 (L) 01/08/2016 1850   GFRAA >60 01/08/2016 1850   Lab Results  Component Value Date   CHOL 286 (H) 06/30/2016   HDL 96 06/30/2016   LDLCALC 172 (H) 06/30/2016   TRIG 90 06/30/2016   CHOLHDL 3.0 06/30/2016   Lab Results  Component Value Date   HGBA1C 5.6 04/28/2016   Lab Results  Component Value Date   VITAMINB12 2,060 (H) 11/11/2015   Lab Results  Component Value Date   TSH 0.515 11/30/2015       ASSESSMENT AND PLAN  Multiple sclerosis ([facility])  Right sided weakness  Gait difficulty  Bilateral shoulder bursitis  Right sided sciatica   1.    Continue Tysabri.  She is JCV Ab low titer positive (0.66).   . 2.    Inject right subacromial bursa with 40 mg in 2.5 mL Marcaine using sterile technique. She tolerated the procedure well.   There were no complications 3.    Inject the right piriformis muscle with 40 mg Depo-Medrol in 2.5 mL Marcaine using sterile technique. She tolerated the procedure well. There were no complications.  4.    Continue baclofen, oxybutynin and trazodone 5.    She will return to see me in 3- 4 months or sooner if there are new or worsening neurologic symptoms.    Nelline Lio A. Felecia Shelling, MD, PhD [id number], [id number] PM Certified in Neurology, Clinical Neurophysiology, Sleep Medicine, Pain Medicine and Neuroimaging  [facility] [address], [place], [place] [address] [phone]

## 2016-10-15 ENCOUNTER — Other Ambulatory Visit: Payer: Federal, State, Local not specified - PPO

## 2016-10-15 ENCOUNTER — Ambulatory Visit: Payer: Federal, State, Local not specified - PPO

## 2016-10-27 ENCOUNTER — Telehealth: Payer: Self-pay | Admitting: Neurology

## 2016-10-27 NOTE — Telephone Encounter (Signed)
I have spoken with Janeliz again this am--per RAS, he would like to see her in the office today or tomorrow.  She sts. is unable to come in today--appt. given 11am tomorrow/fim

## 2016-10-27 NOTE — Telephone Encounter (Signed)
I have spoken with Wanda [name] this morning. She c/o left sided  h/a on 10-19-16 which has resolved, but has had increased right arm weakness, difficulty with gait/balance since then.  Will check with RAS and call her back/fim

## 2016-10-27 NOTE — Telephone Encounter (Signed)
Patient has a new weakness. Thinks she had a vessel  break in her head around 10-19-16 because she cannot use her right hand and balance is off.

## 2016-10-28 ENCOUNTER — Telehealth: Payer: Self-pay | Admitting: *Deleted

## 2016-10-28 ENCOUNTER — Ambulatory Visit (INDEPENDENT_AMBULATORY_CARE_PROVIDER_SITE_OTHER): Payer: Medicare HMO | Admitting: Neurology

## 2016-10-28 ENCOUNTER — Encounter: Payer: Self-pay | Admitting: Neurology

## 2016-10-28 VITALS — BP 145/88 | HR 69

## 2016-10-28 DIAGNOSIS — R5383 Other fatigue: Secondary | ICD-10-CM

## 2016-10-28 DIAGNOSIS — R531 Weakness: Secondary | ICD-10-CM | POA: Diagnosis not present

## 2016-10-28 DIAGNOSIS — G35 Multiple sclerosis: Secondary | ICD-10-CM

## 2016-10-28 DIAGNOSIS — R269 Unspecified abnormalities of gait and mobility: Secondary | ICD-10-CM

## 2016-10-28 NOTE — Telephone Encounter (Signed)
Patient was seen in the office today by Dr. Arlice Colt.  While here, he ordered 1 gram of Solu-Medrol to be given via IV.  The infusion was performed in the exam room.  #22g catheter placed in left hand, flushed without difficulty. Solu-Medrol 1 gram (Lot [id number], Exp 12/2019, NDC [id number]) placed in 165ml nss.  Placed on pump to infuse over 30 minutes.  Patient's daugther at side.  Infusion completed without incident, IV discontinued with catheter intact - no redness, swelling or discomfort at site.  Pressure and dressing applied to hand.

## 2016-10-28 NOTE — Progress Notes (Signed)
[facility]  PATIENT: Wanda [name] DOB: [birth year]  REFERRING DOCTOR OR PCP:  patient SOURCE: patient and records in EMR, labs/imaging reports, MRI images on PACS  _________________________________   HISTORICAL  CHIEF COMPLAINT:  Chief Complaint  Patient presents with  . Multiple Sclerosis    She is here with her daughter, [name].  She c/o left-sided  h/a on 10-19-16 which has resolved, but has had increased right arm weakness, difficulty with gait/balance since then.    HISTORY OF PRESENT ILLNESS:  [name] is a 66 year old woman with a history of multiple sclerosis.    About 9 days ago, she had the onset of worsening gait.   Both legs seem weaker and the right arm seems worse.      MS:   She is on Tysabri and tolerates it well.   She denies any exacerbations.   She is JCV Ab low positive (0.66).   She has a lot more shoulder pain (bilateral) the past month.   She also reports left elbow pain.    Gait/strength/sensation:    Gait is much worse and she feels both legs are weaker.  She also notes right hand is much worse and she has trouble picking up items. Left arm now mildly weak   At baseline, she has poor balance and right foot drop and uses a walker.   She takes  Baclofen 20 mg and 20 mg at night.   She has dysesthetic pain in the hands, R > L, better with Lamotrigine.   Her left anterolateral thigh dysesthesia is much better.     Bladder:  She has less urinary frequency and urgency on oxybutynin  Vision:   She denies any significant vision problem due to the MS now but had diplopia in past  Fatigue/sleep: She has wore fatigue.   She gets tired after just a little activity (i.e. Fixing breakfast). She is sleeping much better with trazodone 50 mg at bedtime and has no hangover.    She also often takes trazodone before a nap.     She has slept poorly x 2 nights due to her pain.   Mood/cognition: She denies any significant depression. She does note some  anxiety. She had been prescribed lamotrigine and she takes 100 mg nightly as a mood stabilizer  Shoulder Pain:   She has less right shoulder pain since the subacromial bursa injection last month.          Right buttock pain:   She also reports less pain in th right buttock since the shot.    MS History:   In late 2014, she woke up one day unable to get out of bed due to weakness. There is on both sides of her body and she had clumsiness. MRI of the brain showed multiple plaques worrisome for MS. She had a follow-up contrasted MRI of the brain as well as the cervical spine showing multiple lesions, many of which enhance, including some in the infratentorial region. She was started on Tysabri as her first drug due to the aggressiveness of the multiple sclerosis. She has continued to be on Tysabri with monthly infusions. She is JCV antibody low positive.     I personally reviewed her last MRI from 03/13/2015 showing multiple T2/FLAIR hyperintense foci, many of which are periventricular. She has some cortical atrophy. There is also a focus adjacent to C2 and some in the pons.   Recent lab results were also reviewed.   REVIEW OF  SYSTEMS: Constitutional: No fevers, chills, sweats, or change in appetite.  She has fatigue and insomnia Eyes: No visual changes, double vision, eye pain Ear, nose and throat: No hearing loss, ear pain, nasal congestion, sore throat Cardiovascular: No chest pain, palpitations Respiratory: No shortness of breath at rest or with exertion.   No wheezes GastrointestinaI: No nausea, vomiting, diarrhea, abdominal pain, fecal incontinence.   Recent C. difficile diarrhea Genitourinary: She has urinary hesitancy and does not completely empties the bladder. She also has urinary urgency and frequency. Musculoskeletal: No neck pain, back pain Integumentary: No rash, pruritus, skin lesions Neurological: as above Psychiatric: No depression at this time.  Some anxiety Endocrine: No  palpitations, diaphoresis, change in appetite, change in weigh or increased thirst Hematologic/Lymphatic: No anemia, purpura, petechiae. Allergic/Immunologic: No itchy/runny eyes, nasal congestion, recent allergic reactions, rashes  ALLERGIES: No Known Allergies  HOME MEDICATIONS:  Current Outpatient Prescriptions:  .  amLODipine (NORVASC) 5 MG tablet, take 1 tablet by mouth once daily FOR ESSENTIAL HYPERTENSION., Disp: 90 tablet, Rfl: 1 .  baclofen (LIORESAL) 10 MG tablet, Take 1 by mouth every morning, 1 by mouth every afternoon, 2 by mouth daily at bedtime., Disp: 360 each, Rfl: 3 .  Cholecalciferol (VITAMIN D3) 2000 UNITS capsule, Take 2,000 Units by mouth daily., Disp: , Rfl:  .  lamoTRIgine (LAMICTAL) 100 MG tablet, Take 1 tablet (100 mg total) by mouth at bedtime., Disp: 90 tablet, Rfl: 1 .  natalizumab (TYSABRI) 300 MG/15ML injection, Inject 15 mLs (300 mg total) into the vein every 30 (thirty) days., Disp: 15 mL, Rfl:  .  Omega-3 Fatty Acids (OMEGA 3 500 PO), Take 500 mg by mouth 2 (two) times daily. Reported on 02/18/2016, Disp: , Rfl:  .  oxybutynin (DITROPAN-XL) 10 MG 24 hr tablet, take 2 tablets by mouth daily for OVERACTIVE BLADDER, Disp: 180 tablet, Rfl: 3 .  tobramycin-dexamethasone (TOBRADEX) ophthalmic solution, instill 1 drop into right eye four times a day, Disp: 5 mL, Rfl: 2 .  traZODone (DESYREL) 100 MG tablet, take 1 to 1 1/2  pills a day for SLEEP., Disp: 135 tablet, Rfl: 3  PAST MEDICAL HISTORY: Past Medical History:  Diagnosis Date  . Acute encephalopathy 03/12/2015  . Anemia   . Anxiety   . Arthritis   . Cervical dysplasia   . Depression   . Elevated cholesterol   . Hypertension   . Macular degeneration of right eye 2001  . MS (multiple sclerosis) ([place]) 08/2013    PAST SURGICAL HISTORY: Past Surgical History:  Procedure Laterality Date  . CHOLECYSTECTOMY    . COLONOSCOPY W/ POLYPECTOMY    . COLPOSCOPY    . KNEE SURGERY Bilateral    "had cortisone  injections in my knees"  . TONSILLECTOMY    . TUBAL LIGATION      FAMILY HISTORY: Family History  Problem Relation Age of Onset  . Cancer Mother     Colon  . Diabetes Mother   . Hypertension Mother   . Arthritis Mother   . Cancer Father     prostate  . Kidney disease Brother     congenital single kidney  . Arthritis Sister   . Arthritis Sister   . Hematuria Son   . Gout Brother   . Multiple sclerosis Brother   . Arthritis Brother   . HIV Brother   . Cancer Brother     spinal    SOCIAL HISTORY:  Social History   Social History  . Marital status: Widowed  Spouse name: N/A  . Number of children: 2  . Years of education: college   Occupational History  . retired     Social History Main Topics  . Smoking status: Former Smoker    Packs/day: 1.00    Years: 45.00    Types: Cigarettes    Quit date: 10/23/2013  . Smokeless tobacco: Never Used     Comment: Quit smoking in 2014.  Marland Kitchen Alcohol use No     Comment: hasn't drank anything since jan 2015  . Drug use: No  . Sexual activity: No   Other Topics Concern  . Not on file   Social History Narrative   Grew up in [place] area, 1 of 10 siblings - 81 still living, married for 33 years then divorced, moved to [place] to be near daughter post retirement, als has 1 son. 2 dogs.       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      PHYSICAL EXAM  Vitals:   10/28/16 1120  BP: (!) 145/88  Pulse: 69    There is no height or weight on file to calculate BMI.   General: The patient is well-developed and well-nourished and in no acute distress  Neck:  The neck is nontender.   Musculoskeletal:  Back is tender over right piriformis muscle.    Moderately tender over right subacromial bursa .    Neurologic Exam  Mental status: The patient is alert and oriented x 3 at the time of the examination. The patient has apparent normal recent and remote memory, with an apparently normal attention span  and concentration ability.   Speech is normal.  Cranial nerves: Extraocular movements are full.Facial strength is normal.  Trapezius and sternocleidomastoid strength is normal. No dysarthria is noted.  The tongue is midline, and the patient has symmetric elevation of the soft palate. No obvious hearing deficits are noted.  Motor:  Muscle bulk is normal.   Tone is increased, right > left.  Strength is 3 to 4-/5 in right leg and 4-/5 in left leg.  Strength is  4/5 in right arm and 4+/5 on the left   Sensory: Sensory testing show decreased right sided touch and vibration sensation.  She reports numbness in the distribution of the left lateral femoral cutaneous nerve  Coordination: Cerebellar testing reveals reduced right worse than left finger-nose-finger.  Can't do heel to shin  Gait and station: She needs bilateral support to stand and to take a couple steps.   Cannot tandem. Romberg is positive.   Reflexes: Deep tendon reflexes are symmetric and normal bilaterally.       DIAGNOSTIC DATA (LABS, IMAGING, TESTING) - I reviewed patient records, labs, notes, testing and imaging myself where available.  Lab Results  Component Value Date   WBC 5.3 06/16/2016   HGB 11.4 (L) 01/08/2016   HCT 38.7 06/16/2016   MCV 83 06/16/2016   PLT 212 06/16/2016      Component Value Date/Time   NA 141 06/30/2016 1100   NA 136 08/10/2014 1304   K 4.5 06/30/2016 1100   CL 105 06/30/2016 1100   CO2 26 06/30/2016 1100   GLUCOSE 90 06/30/2016 1100   BUN 14 06/30/2016 1100   BUN 15 08/10/2014 1304   CREATININE 0.98 06/30/2016 1100   CALCIUM 10.0 06/30/2016 1100   PROT 8.1 06/30/2016 1100   PROT 7.2 08/10/2014 1304   ALBUMIN 5.1 06/30/2016 1100   ALBUMIN 4.5 08/10/2014  1304   AST 14 06/30/2016 1100   ALT 16 06/30/2016 1100   ALKPHOS 67 06/30/2016 1100   BILITOT 0.5 06/30/2016 1100   GFRNONAA 52 (L) 01/08/2016 1850   GFRAA >60 01/08/2016 1850   Lab Results  Component Value Date   CHOL 286 (H)  06/30/2016   HDL 96 06/30/2016   LDLCALC 172 (H) 06/30/2016   TRIG 90 06/30/2016   CHOLHDL 3.0 06/30/2016   Lab Results  Component Value Date   HGBA1C 5.6 04/28/2016   Lab Results  Component Value Date   VITAMINB12 2,060 (H) 11/11/2015   Lab Results  Component Value Date   TSH 0.515 11/30/2015       ASSESSMENT AND PLAN  Multiple sclerosis ([place]) - Plan: MR BRAIN W WO CONTRAST, MR CERVICAL SPINE W WO CONTRAST  Gait difficulty - Plan: MR BRAIN W WO CONTRAST, MR CERVICAL SPINE W WO CONTRAST  Other fatigue  Right sided weakness     1.    Likely exacerbation.   Changes occurred over one day o PML unlikely.   Continue Tysabri for now  She is JCV Ab low titer positive (0.66).   We need to check MRI of the brain to determine if changes in gait/strength due to MS relapse, spinal cord process (myelopathy) or PML.   2.    IV Solumedrol 1 gram today and tomorrow 4.    Continue baclofen, oxybutynin and trazodone 5.    She will return to see me in 3- 4 months or sooner if there are new or worsening neurologic symptoms.    Richard A. Felecia Shelling, MD, PhD [id number], [id number] AM Certified in Neurology, Clinical Neurophysiology, Sleep Medicine, Pain Medicine and Neuroimaging  [facility] [address], [place], [place] [address] [phone]

## 2016-10-29 ENCOUNTER — Ambulatory Visit
Admission: RE | Admit: 2016-10-29 | Discharge: 2016-10-29 | Disposition: A | Discharge status: Home / Self Care | Payer: Medicare HMO | Source: Ambulatory Visit | Attending: Neurology | Admitting: Neurology

## 2016-10-29 DIAGNOSIS — R269 Unspecified abnormalities of gait and mobility: Secondary | ICD-10-CM

## 2016-10-29 DIAGNOSIS — G35 Multiple sclerosis: Secondary | ICD-10-CM

## 2016-10-29 DIAGNOSIS — G35D Multiple sclerosis, unspecified: Secondary | ICD-10-CM

## 2016-10-29 MED ORDER — GADOBENATE DIMEGLUMINE 529 MG/ML IV SOLN
10.0000 mL | Freq: Once | INTRAVENOUS | Status: AC | PRN
  Administered 2016-10-29: 10 mL via INTRAVENOUS

## 2016-11-02 ENCOUNTER — Telehealth: Payer: Self-pay | Admitting: *Deleted

## 2016-11-02 NOTE — Telephone Encounter (Signed)
-----   Message from Britt Bottom, MD sent at 11/01/2016  9:52 PM EST ----- Please let her know that the MRI of the brain was unchanged.

## 2016-11-02 NOTE — Telephone Encounter (Signed)
I have spoken with Deseree this morning, and per RAS, reviewed MRI results as below.  She verbalized understanding of same/fim

## 2016-11-02 NOTE — Telephone Encounter (Signed)
I have spoken with Wanda [name] this morning and per RAS, reviewed MRI results as below.  She verbalized understanding of same/fim

## 2016-11-02 NOTE — Telephone Encounter (Signed)
-----   Message from Britt Bottom, MD sent at 11/01/2016  9:54 PM EST ----- Presenter the MRI of the cervical spine shows two old MS plaques.   She also has some disc degenerative changes but no nerve root compression.

## 2016-11-06 ENCOUNTER — Telehealth: Payer: Self-pay | Admitting: Neurology

## 2016-11-06 NOTE — Telephone Encounter (Signed)
I have spoken with Wanda [name] today.  She c/o increased appetite, poor sleep since having IV steroids.  Other sx. are improved tho.  I have explained these are common side effects of steroids, and she should see these improve, but if they don't she should call back next week.  She verbalized understanding of same/fim

## 2016-11-06 NOTE — Telephone Encounter (Signed)
Patient had infusion and steroid drip last week. Since then she is very weak and wants to sleep all the time.

## 2016-12-08 ENCOUNTER — Encounter: Payer: Self-pay | Admitting: *Deleted

## 2016-12-14 ENCOUNTER — Ambulatory Visit: Payer: Federal, State, Local not specified - PPO | Admitting: Neurology

## 2016-12-14 ENCOUNTER — Telehealth: Payer: Self-pay | Admitting: *Deleted

## 2016-12-14 ENCOUNTER — Ambulatory Visit: Payer: Self-pay | Admitting: Neurology

## 2016-12-14 NOTE — Telephone Encounter (Signed)
[facility] and r/s her appt. today with Dr. Felecia Shelling.  Our office will be closing early due to inclement weather/fim

## 2016-12-15 NOTE — Telephone Encounter (Signed)
Patient's appointment was cancelled for 3--12-18 because of weather. She would like to reschedule and needs a soon appointment.

## 2016-12-15 NOTE — Telephone Encounter (Signed)
I have spoken with pt. and given appt/fim

## 2016-12-18 ENCOUNTER — Encounter: Payer: Self-pay | Admitting: Neurology

## 2016-12-23 ENCOUNTER — Encounter: Payer: Self-pay | Admitting: Neurology

## 2016-12-23 ENCOUNTER — Ambulatory Visit (INDEPENDENT_AMBULATORY_CARE_PROVIDER_SITE_OTHER): Payer: Medicare HMO | Admitting: Neurology

## 2016-12-23 VITALS — BP 127/84 | HR 91 | Resp 18 | Ht 61.0 in | Wt 126.5 lb

## 2016-12-23 DIAGNOSIS — G4700 Insomnia, unspecified: Secondary | ICD-10-CM

## 2016-12-23 DIAGNOSIS — Z79899 Other long term (current) drug therapy: Secondary | ICD-10-CM

## 2016-12-23 DIAGNOSIS — R269 Unspecified abnormalities of gait and mobility: Secondary | ICD-10-CM | POA: Diagnosis not present

## 2016-12-23 DIAGNOSIS — G35 Multiple sclerosis: Secondary | ICD-10-CM | POA: Diagnosis not present

## 2016-12-23 DIAGNOSIS — R350 Frequency of micturition: Secondary | ICD-10-CM

## 2016-12-23 DIAGNOSIS — R5383 Other fatigue: Secondary | ICD-10-CM

## 2016-12-23 NOTE — Progress Notes (Signed)
[facility]  PATIENT: Wanda [name] DOB: 1951-07-05  REFERRING DOCTOR OR PCP:  patient SOURCE: patient and records in EMR, labs/imaging reports, MRI images on PACS  _________________________________   HISTORICAL  CHIEF COMPLAINT:  Chief Complaint  Patient presents with  . Multiple Sclerosis    Sts. she continues to tolerate Tysabri well.  JCV ab last checked 06-17-16 and was positive at 0.66.  Sts. feels generalized weakness is worse.  Would like to discuss other possible tx. options.  Still having difficulty going to sleep, despite Trazodone 100mg  qhs.  Sts. has better luck if she takes 200mg /fim    HISTORY OF PRESENT ILLNESS:  Wanda [name] is a 66 year old woman with a history of multiple sclerosis.      In January, She had more issues with her gait and mild leg weakness. Additionally the right arm seems a little bit weak. She received several days of IV Solu-Medrol    An MRI of the brain 10/2016 showed multiple chronic demyelinating plaque and atrophy. It was essentially unchanged from her previous MRI. The MRI of the cervical spine showed two lesions within the spinal cord, the largest adjacent to C2.  MS:   She is on Tysabri and tolerates it well.   She denies any exacerbations.   She is JCV Ab low positive (0.66) when tested in September 2017.  Gait/strength/sensation:    She feels balance is worse and she She feels legs are mildly weaker.   The right hand seems weaker and is more clumsy.   She uses a walker.   She takes  Baclofen 20 mg and 20 mg at night.   She has dysesthetic pain in the hands, R > L, better with Lamotrigine.   Her left anterolateral thigh dysesthesia is much better.     Joint pain:   She notes more pain in her joints.    She  Bladder:  She has less urinary frequency and urgency on oxybutynin  Vision:   She denies any significant vision problem due to the MS now but had diplopia in past  Fatigue/sleep: She has wore fatigue.   She gets  tired after just a little activity (i.e. Fixing breakfast). She is sleeping much better with trazodone 50 mg at bedtime and has no hangover.    She also often takes trazodone before a nap.     She has slept poorly x 2 nights due to her pain.   Mood/cognition: She denies any significant depression. She does note some anxiety. She had been prescribed lamotrigine and she takes 100 mg nightly as a mood stabilizer  Shoulder Pain:   She has less right shoulder pain since the subacromial bursa injection last month.          Right buttock pain:   She also reports less pain in th right buttock since the shot.    MS History:   In late 2014, she woke up one day unable to get out of bed due to weakness. There is on both sides of her body and she had clumsiness. MRI of the brain showed multiple plaques worrisome for MS. She had a follow-up contrasted MRI of the brain as well as the cervical spine showing multiple lesions, many of which enhance, including some in the infratentorial region. She was started on Tysabri as her first drug due to the aggressiveness of the multiple sclerosis. She has continued to be on Tysabri with monthly infusions. She is JCV antibody low positive.  I personally reviewed her last MRI from 03/13/2015 showing multiple T2/FLAIR hyperintense foci, many of which are periventricular. She has some cortical atrophy. There is also a focus adjacent to C2 and some in the pons.   Recent lab results were also reviewed.   REVIEW OF SYSTEMS: Constitutional: No fevers, chills, sweats, or change in appetite.  She has fatigue and insomnia Eyes: No visual changes, double vision, eye pain Ear, nose and throat: No hearing loss, ear pain, nasal congestion, sore throat Cardiovascular: No chest pain, palpitations Respiratory: No shortness of breath at rest or with exertion.   No wheezes GastrointestinaI: No nausea, vomiting, diarrhea, abdominal pain, fecal incontinence.   Recent C. difficile  diarrhea Genitourinary: She has urinary hesitancy and does not completely empties the bladder. She also has urinary urgency and frequency. Musculoskeletal: No neck pain, back pain Integumentary: No rash, pruritus, skin lesions Neurological: as above Psychiatric: No depression at this time.  Some anxiety Endocrine: No palpitations, diaphoresis, change in appetite, change in weigh or increased thirst Hematologic/Lymphatic: No anemia, purpura, petechiae. Allergic/Immunologic: No itchy/runny eyes, nasal congestion, recent allergic reactions, rashes  ALLERGIES: No Known Allergies  HOME MEDICATIONS:  Current Outpatient Prescriptions:  .  amLODipine (NORVASC) 5 MG tablet, take 1 tablet by mouth once daily FOR ESSENTIAL HYPERTENSION., Disp: 90 tablet, Rfl: 1 .  baclofen (LIORESAL) 10 MG tablet, Take 1 by mouth every morning, 1 by mouth every afternoon, 2 by mouth daily at bedtime., Disp: 360 each, Rfl: 3 .  Cholecalciferol (VITAMIN D3) 2000 UNITS capsule, Take 2,000 Units by mouth daily., Disp: , Rfl:  .  lamoTRIgine (LAMICTAL) 100 MG tablet, Take 1 tablet (100 mg total) by mouth at bedtime., Disp: 90 tablet, Rfl: 1 .  natalizumab (TYSABRI) 300 MG/15ML injection, Inject 15 mLs (300 mg total) into the vein every 30 (thirty) days., Disp: 15 mL, Rfl:  .  Omega-3 Fatty Acids (OMEGA 3 500 PO), Take 500 mg by mouth 2 (two) times daily. Reported on 02/18/2016, Disp: , Rfl:  .  oxybutynin (DITROPAN-XL) 10 MG 24 hr tablet, take 2 tablets by mouth daily for OVERACTIVE BLADDER, Disp: 180 tablet, Rfl: 3 .  traZODone (DESYREL) 100 MG tablet, take 1 to 1 1/2  pills a day for SLEEP., Disp: 135 tablet, Rfl: 3 .  tobramycin-dexamethasone (TOBRADEX) ophthalmic solution, instill 1 drop into right eye four times a day (Patient not taking: Reported on 12/23/2016), Disp: 5 mL, Rfl: 2  PAST MEDICAL HISTORY: Past Medical History:  Diagnosis Date  . Acute encephalopathy 03/12/2015  . Anemia   . Anxiety   . Arthritis    . Cervical dysplasia   . Depression   . Elevated cholesterol   . Hypertension   . Macular degeneration of right eye 2001  . MS (multiple sclerosis) ([place]) 08/2013    PAST SURGICAL HISTORY: Past Surgical History:  Procedure Laterality Date  . CHOLECYSTECTOMY    . COLONOSCOPY W/ POLYPECTOMY    . COLPOSCOPY    . KNEE SURGERY Bilateral    "had cortisone injections in my knees"  . TONSILLECTOMY    . TUBAL LIGATION      FAMILY HISTORY: Family History  Problem Relation Age of Onset  . Cancer Mother     Colon  . Diabetes Mother   . Hypertension Mother   . Arthritis Mother   . Cancer Father     prostate  . Kidney disease Brother     congenital single kidney  . Arthritis Sister   . Arthritis  Sister   . Hematuria Son   . Gout Brother   . Multiple sclerosis Brother   . Arthritis Brother   . HIV Brother   . Cancer Brother     spinal    SOCIAL HISTORY:  Social History   Social History  . Marital status: Widowed    Spouse name: N/A  . Number of children: 2  . Years of education: college   Occupational History  . retired     Social History Main Topics  . Smoking status: Former Smoker    Packs/day: 1.00    Years: 45.00    Types: Cigarettes    Quit date: 10/23/2013  . Smokeless tobacco: Never Used     Comment: Quit smoking in 2014.  Marland Kitchen Alcohol use No     Comment: hasn't drank anything since jan 2015  . Drug use: No  . Sexual activity: No   Other Topics Concern  . Not on file   Social History Narrative   Grew up in [place] area, 1 of 10 siblings - 56 still living, married for 33 years then divorced, moved to [place] to be near daughter post retirement, als has 1 son. 2 dogs.       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      PHYSICAL EXAM  Vitals:   12/23/16 1339  BP: 127/84  Pulse: 91  Resp: 18  Weight: 126 lb 8 oz (57.4 kg)  Height: 5\' 1"  (1.549 m)    Body mass index is 23.9 kg/m.   General: The patient is  well-developed and well-nourished and in no acute distress  Musculoskeletal:  Back is tender over piriformis muscles and lower paraspinal muscles  Neurologic Exam  Mental status: The patient is alert and oriented x 3 at the time of the examination. The patient has apparent normal recent and remote memory, with an apparently normal attention span and concentration ability.   Speech is normal.  Cranial nerves: Extraocular movements are full.Facial strength is normal.  Trapezius and sternocleidomastoid strength is normal. No dysarthria is noted.  The tongue is midline, and the patient has symmetric elevation of the soft palate. No obvious hearing deficits are noted.  Motor:  Muscle bulk is normal.   Tone is increased, right > left.  Strength is 3 to 4-/5 in right leg and 4/5 in left leg.  Strength is  4 to 4+/5 in right arm and 4+ to 5/5 on the left   Sensory:  In her legs, she reports better touch sensation on the right and better vibration sensation on the left.  Coordination: Cerebellar testing reveals reduced right worse than left finger-nose-finger.  Can't do heel to shin  Gait and station: She needs bilateral support to stand and to walk.   Cannot tandem. Romberg is positive.   Reflexes: Deep tendon reflexes are symmetric and normal bilaterally.    25 foot timed walk 16.4 seconds (average of 2 trials).       DIAGNOSTIC DATA (LABS, IMAGING, TESTING) - I reviewed patient records, labs, notes, testing and imaging myself where available.  Lab Results  Component Value Date   WBC 5.3 06/16/2016   HGB 11.4 (L) 01/08/2016   HCT 38.7 06/16/2016   MCV 83 06/16/2016   PLT 212 06/16/2016      Component Value Date/Time   NA 141 06/30/2016 1100   NA 136 08/10/2014 1304   K 4.5 06/30/2016 1100   CL 105 06/30/2016  1100   CO2 26 06/30/2016 1100   GLUCOSE 90 06/30/2016 1100   BUN 14 06/30/2016 1100   BUN 15 08/10/2014 1304   CREATININE 0.98 06/30/2016 1100   CALCIUM 10.0 06/30/2016  1100   PROT 8.1 06/30/2016 1100   PROT 7.2 08/10/2014 1304   ALBUMIN 5.1 06/30/2016 1100   ALBUMIN 4.5 08/10/2014 1304   AST 14 06/30/2016 1100   ALT 16 06/30/2016 1100   ALKPHOS 67 06/30/2016 1100   BILITOT 0.5 06/30/2016 1100   GFRNONAA 52 (L) 01/08/2016 1850   GFRAA >60 01/08/2016 1850   Lab Results  Component Value Date   CHOL 286 (H) 06/30/2016   HDL 96 06/30/2016   LDLCALC 172 (H) 06/30/2016   TRIG 90 06/30/2016   CHOLHDL 3.0 06/30/2016   Lab Results  Component Value Date   HGBA1C 5.6 04/28/2016   Lab Results  Component Value Date   VITAMINB12 2,060 (H) 11/11/2015   Lab Results  Component Value Date   TSH 0.515 11/30/2015       ASSESSMENT AND PLAN  Multiple sclerosis ([place]) - Plan: Stratify JCV Antibody Test ([facility]), Hepatitis B surface antigen, Hepatitis B core antibody, total, Comprehensive metabolic panel, Quantiferon tb gold assay (blood), CBC with Differential/Platelet, Hepatitis B surface antibody  Gait difficulty  Insomnia, unspecified type  Urinary frequency  Other fatigue  High risk medication use - Plan: Stratify JCV Antibody Test ([facility]), Hepatitis B surface antigen, Hepatitis B core antibody, total, Comprehensive metabolic panel, Quantiferon tb gold assay (blood), CBC with Differential/Platelet, Hepatitis B surface antibody    1.    With recent exacerbation, consider change to ocrelizumab.  We will check labs and switch if JCV Ab titer > 0.9 and consider swithc if rest of labs ok (patient will give more thought).  2.    Ampyra 10 mg bid.  Check labs 4.    Continue baclofen, oxybutynin and trazodone 5.    She will return to see me in 3- 4 months or sooner if there are new or worsening neurologic symptoms.   45 minute face-to-face evaluation with greater than one half of the time counseling and coordinating care about her new MS symptoms and consideration of a different disease modifying therapy and symptomatic treatments.  Richard A. Felecia Shelling,  MD, PhD 0/27/2536, 6:44 PM Certified in Neurology, Clinical Neurophysiology, Sleep Medicine, Pain Medicine and Neuroimaging  [facility] [address] [place], [place] [address] [phone]

## 2016-12-25 LAB — CBC WITH DIFFERENTIAL/PLATELET
BASOS ABS: 0.1 10*3/uL (ref 0.0–0.2)
BASOS: 1 %
EOS (ABSOLUTE): 0.3 10*3/uL (ref 0.0–0.4)
Eos: 5 %
Hematocrit: 36.9 % (ref 34.0–46.6)
Hemoglobin: 12.7 g/dL (ref 11.1–15.9)
IMMATURE GRANS (ABS): 0 10*3/uL (ref 0.0–0.1)
IMMATURE GRANULOCYTES: 1 %
LYMPHS: 32 %
Lymphocytes Absolute: 2.1 10*3/uL (ref 0.7–3.1)
MCH: 28.9 pg (ref 26.6–33.0)
MCHC: 34.4 g/dL (ref 31.5–35.7)
MCV: 84 fL (ref 79–97)
Monocytes Absolute: 0.4 10*3/uL (ref 0.1–0.9)
Monocytes: 7 %
Neutrophils Absolute: 3.6 10*3/uL (ref 1.4–7.0)
Neutrophils: 54 %
PLATELETS: 225 10*3/uL (ref 150–379)
RBC: 4.39 x10E6/uL (ref 3.77–5.28)
RDW: 15.5 % — ABNORMAL HIGH (ref 12.3–15.4)
WBC: 6.4 10*3/uL (ref 3.4–10.8)

## 2016-12-25 LAB — COMPREHENSIVE METABOLIC PANEL
A/G RATIO: 2 (ref 1.2–2.2)
ALT: 11 IU/L (ref 0–32)
AST: 16 IU/L (ref 0–40)
Albumin: 5.1 g/dL — ABNORMAL HIGH (ref 3.6–4.8)
Alkaline Phosphatase: 87 IU/L (ref 39–117)
BUN/Creatinine Ratio: 14 (ref 12–28)
BUN: 13 mg/dL (ref 8–27)
Bilirubin Total: 0.2 mg/dL (ref 0.0–1.2)
CALCIUM: 10.1 mg/dL (ref 8.7–10.3)
CO2: 20 mmol/L (ref 18–29)
Chloride: 103 mmol/L (ref 96–106)
Creatinine, Ser: 0.93 mg/dL (ref 0.57–1.00)
GFR calc Af Amer: 74 mL/min/{1.73_m2} (ref >59)
GFR, EST NON AFRICAN AMERICAN: 64 mL/min/{1.73_m2} (ref >59)
Globulin, Total: 2.5 g/dL (ref 1.5–4.5)
Glucose: 117 mg/dL — ABNORMAL HIGH (ref 65–99)
POTASSIUM: 3.7 mmol/L (ref 3.5–5.2)
Sodium: 144 mmol/L (ref 134–144)
Total Protein: 7.6 g/dL (ref 6.0–8.5)

## 2016-12-25 LAB — HEPATITIS B CORE ANTIBODY, TOTAL: Hep B Core Total Ab: NEGATIVE

## 2016-12-25 LAB — HEPATITIS B SURFACE ANTIGEN: Hepatitis B Surface Ag: NEGATIVE

## 2016-12-25 LAB — HEPATITIS B SURFACE ANTIBODY,QUALITATIVE: Hep B Surface Ab, Qual: NONREACTIVE

## 2016-12-28 LAB — QUANTIFERON IN TUBE
QFT TB AG MINUS NIL VALUE: 0 IU/mL
QUANTIFERON TB AG VALUE: 0.02 IU/mL
QUANTIFERON TB GOLD: NEGATIVE
Quantiferon Nil Value: 0.02 IU/mL

## 2016-12-28 LAB — QUANTIFERON TB GOLD ASSAY (BLOOD)

## 2016-12-29 ENCOUNTER — Encounter: Payer: Self-pay | Admitting: *Deleted

## 2016-12-30 ENCOUNTER — Telehealth: Payer: Self-pay | Admitting: Internal Medicine

## 2016-12-30 NOTE — Telephone Encounter (Signed)
Pt is trying to find an assisted living and needs a skill set filled out. Please advise.

## 2016-12-31 NOTE — Telephone Encounter (Signed)
Spoke with pt. She is waiting on the paper work from [facility]. Once she receives the paperwork she will contact us to move her appt up.

## 2017-01-01 ENCOUNTER — Telehealth: Payer: Self-pay | Admitting: Neurology

## 2017-01-01 NOTE — Telephone Encounter (Signed)
I discussed the lab results. Her JCV antibody is low positive but essentially unchanged when compared to her test 6 months ago. She feels she is doing fairly well on the Tysabri so would like to stay on it. She understands that if she decides to change we could consider ocrelizumab (her hepatitis and TB labs were fine)

## 2017-01-04 NOTE — Telephone Encounter (Signed)
I have spoken with Wanda [name] this morning.  She sts. based on conversation with RAS, she has decided she would like to stop Tysabri, start Ocrevus.  Labs have been done and she has already signed Ocrevus srf.  SRF completed and given to [facility] in the infusion suite.  Pt. aware 01-20-17 appt. for Ty infusion will be cancelled, as she must be off of Ty for a period of time prior to starting Ocrevus/fim

## 2017-01-04 NOTE — Telephone Encounter (Signed)
Pt has additional questions in response to last conversation with Dr Felecia Shelling

## 2017-01-06 ENCOUNTER — Telehealth: Payer: Self-pay | Admitting: Neurology

## 2017-01-06 MED ORDER — OXYBUTYNIN CHLORIDE 5 MG PO TABS: 10.0000 mg | ORAL_TABLET | Freq: Two times a day (BID) | ORAL | 5 refills | Status: DC

## 2017-01-06 NOTE — Telephone Encounter (Signed)
Pt says she is very weak, can hardly walk. She also has loss of appetite. Please call

## 2017-01-06 NOTE — Telephone Encounter (Signed)
Pt called said she was returning RN's call °

## 2017-01-06 NOTE — Telephone Encounter (Signed)
Patient called office states [facility] pharmacy said patient can do Oxybutynin 5mg  4 times daily it will be covered.  Please call

## 2017-01-06 NOTE — Telephone Encounter (Signed)
I have spoken with pt. this morning.  She c/o increasing bilat leg and right arm weakness, sts. barely able to get out of the bed. Denies uti sx., n/v/d, fever, other sx.  Increasing weakness of legs and right arm was a complaint at last ov.  Will check with RAS and call pt. back/fim

## 2017-01-06 NOTE — Telephone Encounter (Signed)
I have spoken with Shanigua and per RAS, advised that, as this is a continuing complaint, not a true exacerbation, he prefers she not have any more steroids, as they can adversely affect bone health.  As this is a problem related to MS, not an acute, treatable cause, the ER will likely not be able to offer any intervention.  She verbalized understanding of same, is agreeable to trying Ampyra.  SRF completed and faxed to [facility] Pt. Support Services, fax # [phone]

## 2017-01-06 NOTE — Telephone Encounter (Signed)
Pt called said oxybutynin (DITROPAN-XL) 10 MG 24 hr tablet is going to cost $100/mth, it is a 3 tier medication. Ins advised her there is a medication similar that she will not have a copay. Said she will take 4 tabs/day, she does not know the name of the medication.  Please call

## 2017-01-06 NOTE — Addendum Note (Signed)
Addended by: France Ravens I on: 01/06/2017 05:18 PM   Modules accepted: Orders

## 2017-01-06 NOTE — Telephone Encounter (Signed)
I have spoken with Wanda [name] this afternoon and per RAS, advised it is ok to take Oxybutynin 5mg , 2 in the am and 2 in the pm.  She verbalized understanding of same.  Rx. escribed to [facility] per her request/fim

## 2017-01-07 ENCOUNTER — Telehealth: Payer: Self-pay | Admitting: Neurology

## 2017-01-07 ENCOUNTER — Telehealth: Payer: Self-pay | Admitting: *Deleted

## 2017-01-07 DIAGNOSIS — G35 Multiple sclerosis: Secondary | ICD-10-CM

## 2017-01-07 MED ORDER — OXYBUTYNIN CHLORIDE 5 MG PO TABS: 10.0000 mg | ORAL_TABLET | Freq: Two times a day (BID) | ORAL | 3 refills | Status: DC

## 2017-01-07 MED ORDER — LAMOTRIGINE 100 MG PO TABS: 100.0000 mg | ORAL_TABLET | Freq: Every day | ORAL | 1 refills | Status: DC

## 2017-01-07 NOTE — Telephone Encounter (Signed)
Pt is asking Faith to call her back re: the cost of the treatment with [name], pt said it will cost her $800.00 and would like to discuss that.

## 2017-01-07 NOTE — Telephone Encounter (Signed)
Oxybutynin and Lamictal escribed to [facility] per faxed request/fim

## 2017-01-07 NOTE — Telephone Encounter (Signed)
I have spoken with Odie this morning.  She sts. she received a call from [employer] saying her copay would be around $800.  Will print this for [employer] so they may help pt. search for pt. assistance/fim

## 2017-01-13 NOTE — Telephone Encounter (Signed)
Pt said she rec'd a letter from the PAP requesting she call them. She is wanting to know if she should. Please call to discuss

## 2017-01-13 NOTE — Telephone Encounter (Signed)
I have spoken with Wanda [name] this morning.  She sts. she received a list of pt. assistance foundations from [name]. Support and wonders if she should call them.  I have assured her these are legitimate foundations ([employer], [employer], etc.) and she can call them to request financial assistance for med copays/fim

## 2017-01-14 ENCOUNTER — Telehealth: Payer: Self-pay | Admitting: Neurology

## 2017-01-14 NOTE — Telephone Encounter (Signed)
Patient calling to find out if she is supposed to have an infusion.

## 2017-01-15 NOTE — Telephone Encounter (Signed)
This has been discussed in mult phone calls with pt./fim

## 2017-01-19 ENCOUNTER — Telehealth: Payer: Self-pay | Admitting: *Deleted

## 2017-01-19 NOTE — Telephone Encounter (Signed)
Ampyra PA completed and faxed to [facility] fax# [phone].  25 ft. timed walk was 16.4 sec (average of 2)/fim

## 2017-01-20 ENCOUNTER — Telehealth: Payer: Self-pay | Admitting: Neurology

## 2017-01-20 NOTE — Telephone Encounter (Signed)
Pt called said her daughter faxed FL2 form this morning for assisted living, she is wanting confirmation it was rec'd. Please call

## 2017-01-20 NOTE — Telephone Encounter (Signed)
Fax received from [facility].  Ampyra PA approved thru 07/19/17.  No PA#.  Member ID: [id number]./JFT

## 2017-01-20 NOTE — Telephone Encounter (Signed)
#   busy/fim 

## 2017-01-20 NOTE — Telephone Encounter (Signed)
LMTC./fim 

## 2017-01-21 NOTE — Telephone Encounter (Signed)
Pt returned RN's call °

## 2017-01-29 NOTE — Telephone Encounter (Signed)
Kelia with [facility] is calling in reference to Ampyra and [employer].  Please call

## 2017-02-01 NOTE — Telephone Encounter (Signed)
FL2 form faxed back to pt's dtr., Ruben Im, fax# [phone], as requested.  I spoke with Leyana last week, and she is aware that we are not able to complete all of this form--but it has been completed to the best of our ability/fim

## 2017-02-01 NOTE — Progress Notes (Signed)
Subjective:    Patient ID: Wanda [name], female    DOB: 07-05-1951, 66 y.o.   MRN: [id number]  HPI The patient is here for follow up.  Hypertension: She is taking her medication daily. She is compliant with a low sodium diet.  She denies chest pain, palpitations, edema, shortness of breath and regular headaches. She is not exercising regularly.    Multiple sclerosis:  She will be starting a new MS medication.  She has no energy.  She has achiness in her shoulders and neck.  The pain is constant and is about a 6-7/10.  Laying down makes her pain worse.  She takes baclofen and advil - 4 tabs in the morning and 4 tabs at night.  Tylenol does not work.    Gait difficulty, weakness:  She needs to use a  walker.  She has difficulty getting out of bed.  She has right leg weakness.  Her hands are very weak. She is getting weaker.  She is off balance.  She fell down the stairs two weeks and was not evaluated.   She is going to move into a assisted living facility. She has difficulty doing her ADL's - getting out of bed, making a cup of coffee.  She has paperwork that needs to be completed  No special diet.  Can administer own medications.  She is incontinent of urine.  She is not incontinent of stool.  She uses a walker all the time.  Her vision is bad.  She has good hearing.    Medications and allergies reviewed with patient and updated if appropriate.  Patient Active Problem List   Diagnosis Date Noted  . Right sided sciatica 09/16/2016  . Left elbow pain 07/15/2016  . B12 deficiency 07/15/2016  . 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 Urinary frequency 03/05/2016  . Bilateral shoulder bursitis 03/05/2016  . Other fatigue 12/18/2015  . Urinary hesitancy 12/18/2015  . Polypharmacy   . Cataract   . Right sided weakness   . Gait difficulty 08/10/2014  . Multiple sclerosis ([place]) 10/25/2013  . Insomnia 03/23/2013  . Fibroids 12/29/2012  . Elevated cholesterol   .  Hypertension   . Arthritis   . Cervical dysplasia     Current Outpatient Prescriptions on File Prior to Visit  Medication Sig Dispense Refill  . amLODipine (NORVASC) 5 MG tablet take 1 tablet by mouth once daily FOR ESSENTIAL HYPERTENSION. 90 tablet 1  . baclofen (LIORESAL) 10 MG tablet Take 1 by mouth every morning, 1 by mouth every afternoon, 2 by mouth daily at bedtime. 360 each 3  . Cholecalciferol (VITAMIN D3) 2000 UNITS capsule Take 2,000 Units by mouth daily.    Marland Kitchen dalfampridine 10 MG TB12 Take 10 mg by mouth 2 (two) times daily.    Marland Kitchen lamoTRIgine (LAMICTAL) 100 MG tablet Take 1 tablet (100 mg total) by mouth at bedtime. 90 tablet 1  . ocrelizumab 600 mg in sodium chloride 0.9 % 500 mL Inject 600 mg into the vein every 6 (six) months.    . Omega-3 Fatty Acids (OMEGA 3 500 PO) Take 500 mg by mouth 2 (two) times daily. Reported on 02/18/2016    . oxybutynin (DITROPAN) 5 MG tablet Take 2 tablets (10 mg total) by mouth 2 (two) times daily. 360 tablet 3  . traZODone (DESYREL) 100 MG tablet take 1 to 1 1/2  pills a day for SLEEP. 135 tablet 3   No current facility-administered medications on file prior  to visit.     Past Medical History:  Diagnosis Date  . Acute encephalopathy 03/12/2015  . Anemia   . Anxiety   . Arthritis   . Cervical dysplasia   . Depression   . Elevated cholesterol   . Hypertension   . Macular degeneration of right eye 2001  . MS (multiple sclerosis) ([place]) 08/2013    Past Surgical History:  Procedure Laterality Date  . CHOLECYSTECTOMY    . COLONOSCOPY W/ POLYPECTOMY    . COLPOSCOPY    . KNEE SURGERY Bilateral    "had cortisone injections in my knees"  . TONSILLECTOMY    . TUBAL LIGATION      Social History   Social History  . Marital status: Widowed    Spouse name: N/A  . Number of children: 2  . Years of education: college   Occupational History  . retired     Social History Main Topics  . Smoking status: Former Smoker    Packs/day: 1.00     Years: 45.00    Types: Cigarettes    Quit date: 10/23/2013  . Smokeless tobacco: Never Used     Comment: Quit smoking in 2014.  Marland Kitchen Alcohol use No     Comment: hasn't drank anything since jan 2015  . Drug use: No  . Sexual activity: No   Other Topics Concern  . Not on file   Social History Narrative   Grew up in [place] area, 1 of 10 siblings - 75 still living, married for 33 years then divorced, moved to [place] to be near daughter post retirement, als has 1 son. 2 dogs.       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     Family History  Problem Relation Age of Onset  . Cancer Mother     Colon  . Diabetes Mother   . Hypertension Mother   . Arthritis Mother   . Cancer Father     prostate  . Kidney disease Brother     congenital single kidney  . Arthritis Sister   . Arthritis Sister   . Hematuria Son   . Gout Brother   . Multiple sclerosis Brother   . Arthritis Brother   . HIV Brother   . Cancer Brother     spinal    Review of Systems  Constitutional: Positive for fatigue. Negative for fever.  HENT: Positive for postnasal drip.   Respiratory: Negative for cough, shortness of breath and wheezing.   Cardiovascular: Negative for chest pain, palpitations and leg swelling.  Musculoskeletal: Positive for gait problem.  Neurological: Positive for weakness (generalized and right leg, hands R> L). Negative for light-headedness, numbness and headaches.       Objective:   Vitals:   02/03/17 0914  BP: 134/78  Pulse: 67  Resp: 16  Temp: 98 F (36.7 C)   Wt Readings from Last 3 Encounters:  02/03/17 130 lb (59 kg)  12/23/16 126 lb 8 oz (57.4 kg)  09/16/16 129 lb (58.5 kg)   Body mass index is 24.56 kg/m.   Physical Exam    Constitutional: chronically ill appearing. No distress.  HENT:  Head: Normocephalic and atraumatic.  Neck: Neck supple. No tracheal deviation present. No thyromegaly present.  No cervical  lymphadenopathy Cardiovascular: Normal rate, regular rhythm and normal heart sounds.   No murmur heard. No carotid bruit .  No edema Pulmonary/Chest: Effort normal and breath sounds normal. No respiratory distress.  No has no wheezes. No rales.  Abdomen: soft, non tender, non distended Skin: Skin is warm and dry. Not diaphoretic.  Psychiatric: Normal mood and affect. Behavior is normal.      Assessment & Plan:    See Problem List for Assessment and Plan of chronic medical problems.

## 2017-02-01 NOTE — Telephone Encounter (Signed)
I have spoken with Johni A. with [facility]. and advised that RAS does not rx. Humira for pt., and PA for Ampyra has been completed/fim

## 2017-02-02 ENCOUNTER — Telehealth: Payer: Self-pay | Admitting: Neurology

## 2017-02-02 NOTE — Telephone Encounter (Signed)
I have spoken with pt. She does not want referral to a pain mx. clinic--would prefer to continue with current meds/fim

## 2017-02-02 NOTE — Telephone Encounter (Signed)
Pt said she is having increased pain in her shoulders, hands and knees s 1 week with not relief. She is wanting pain medication. Please call

## 2017-02-03 ENCOUNTER — Ambulatory Visit (INDEPENDENT_AMBULATORY_CARE_PROVIDER_SITE_OTHER): Payer: Medicare HMO | Admitting: Internal Medicine

## 2017-02-03 ENCOUNTER — Encounter: Payer: Self-pay | Admitting: Internal Medicine

## 2017-02-03 VITALS — BP 134/78 | HR 67 | Temp 98.0°F | Resp 16 | Wt 130.0 lb

## 2017-02-03 DIAGNOSIS — I10 Essential (primary) hypertension: Secondary | ICD-10-CM

## 2017-02-03 DIAGNOSIS — G35 Multiple sclerosis: Secondary | ICD-10-CM

## 2017-02-03 DIAGNOSIS — G894 Chronic pain syndrome: Secondary | ICD-10-CM

## 2017-02-03 DIAGNOSIS — Z23 Encounter for immunization: Secondary | ICD-10-CM | POA: Diagnosis not present

## 2017-02-03 NOTE — Patient Instructions (Signed)
    prevnar immunization administered today.   Medications reviewed and updated.   No changes recommended at this time.    A referral was ordered for palliative care.   Please followup in 6 months

## 2017-02-03 NOTE — Assessment & Plan Note (Signed)
Will be start ocrevus sometime soon Having chronic pain, increased weakness/balance and difficulty performing ADLs - will be going into an assisted living facility

## 2017-02-03 NOTE — Assessment & Plan Note (Signed)
BP well controlled Current regimen effective and well tolerated Continue current medications at current doses  

## 2017-02-03 NOTE — Assessment & Plan Note (Signed)
Shoulders, neck, upper back She feels this is related to her multiple sclerosis, but after shoulder injections for bursitis her pain did improve some Pain is poorly controlled - tylenol does not help advil is helping, but concern for long term side effects Needs pain medication - I am a little concerned with starting a pain medication with her being on trazodone - possible side effects Will refer to palliative care for help with pain management with chronic pain from multiple sclerosis

## 2017-02-03 NOTE — Progress Notes (Signed)
Pre visit review using our clinic review tool, if applicable. No additional management support is needed unless otherwise documented below in the visit note. 

## 2017-02-10 ENCOUNTER — Telehealth: Payer: Self-pay | Admitting: Internal Medicine

## 2017-02-10 NOTE — Telephone Encounter (Signed)
FL2 has been refaxed with correction.

## 2017-02-10 NOTE — Telephone Encounter (Signed)
Pt daughter called about FL2 paperwork , box 11, level of care was not filed out, they need it refaxed with this info filled out. The level of Care is [place]   SNF needs to be checked

## 2017-02-11 DIAGNOSIS — Z0279 Encounter for issue of other medical certificate: Secondary | ICD-10-CM

## 2017-02-12 IMAGING — MR MR HEAD WO/W CM
10 of 17 series · 26 of 48 positions shown · IV contrast (Yes)
Comparison: Head CT from yesterday

CLINICAL DATA: Acute encephalopathy.  Multiple sclerosis.

EXAM:
MRI HEAD WITHOUT AND WITH CONTRAST
TECHNIQUE: Multiplanar, multiecho pulse sequences of the brain and surrounding
structures were obtained without and with intravenous contrast.
CONTRAST:  12mL MULTIHANCE GADOBENATE DIMEGLUMINE 529 MG/ML IV SOLN

[Series 4: DWI · axial · 3.0mm · 1.09mm/px · z∈[-67,+65]mm · 4 of 90 slices shown (1 of 4)]
[im 1/90]
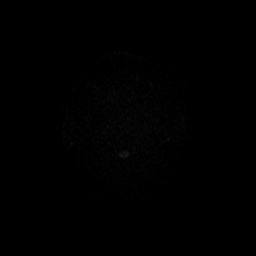
[im 30/90]
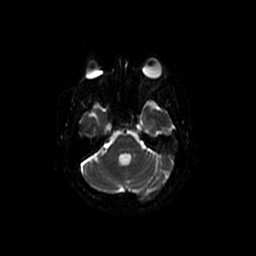
[im 60/90]
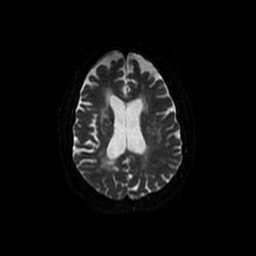
[im 90/90]
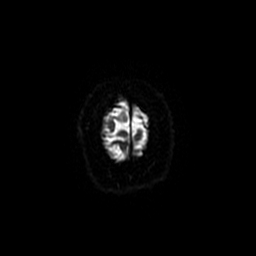

[Series 5: FLAIR · sagittal · 1.2mm · 0.49mm/px · 9 of 320 slices shown (1 of 2)]
[im 1/320]
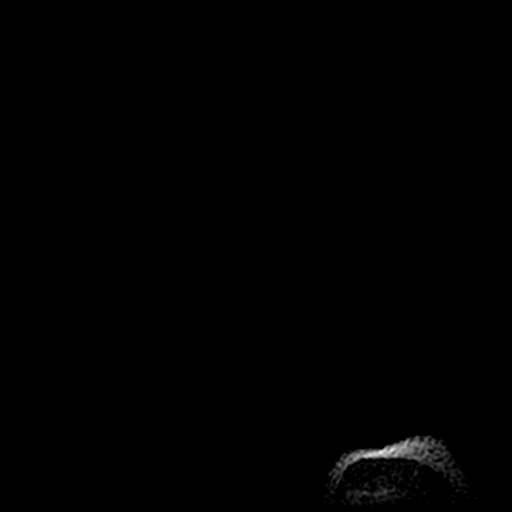
[im 46/320]
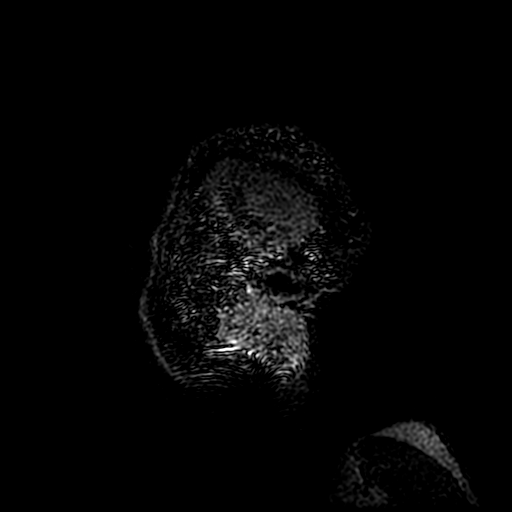
[im 92/320]
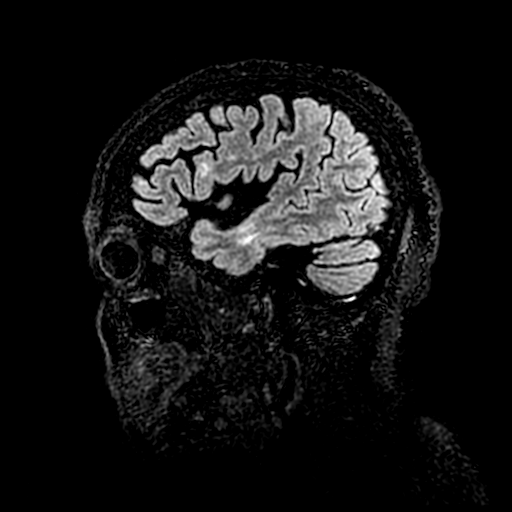
[im 137/320]
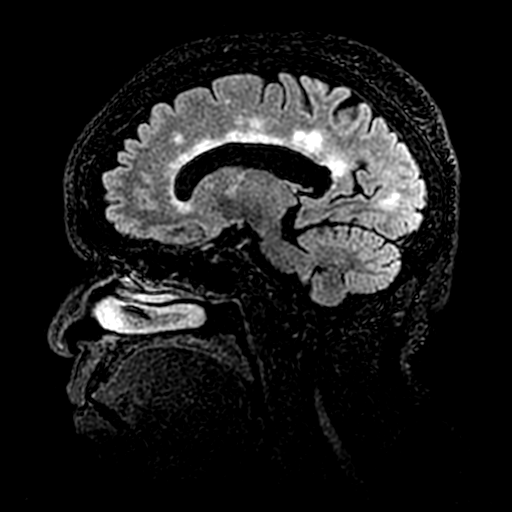
[im 160/320]
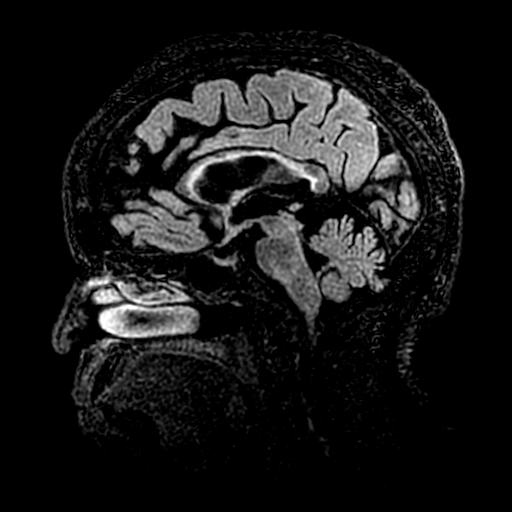
[im 183/320]
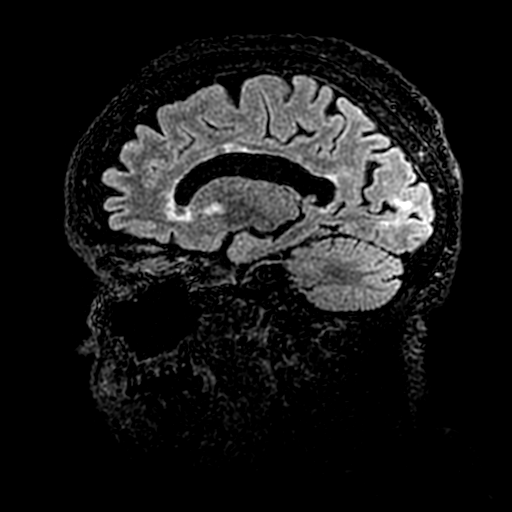
[im 228/320]
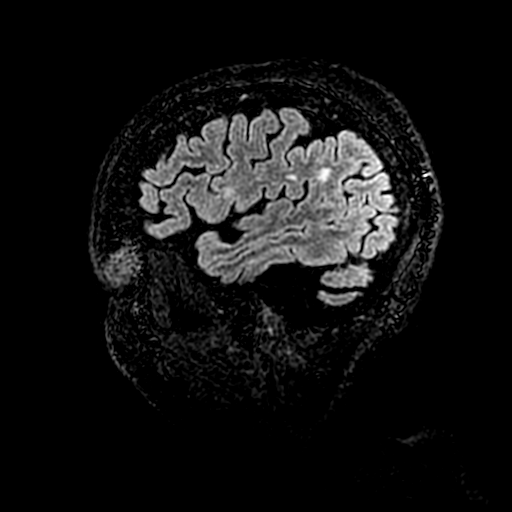
[im 274/320]
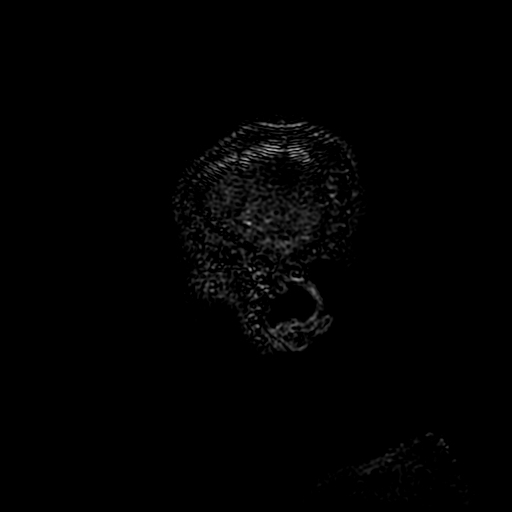
[im 320/320]
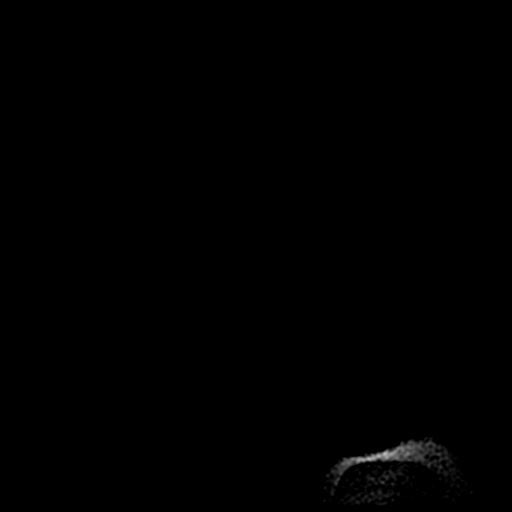

[Series 6: T2 · axial · 5.0mm · 0.43mm/px · 1 of 23 slices shown (1 of 2)]
[im 1/23]
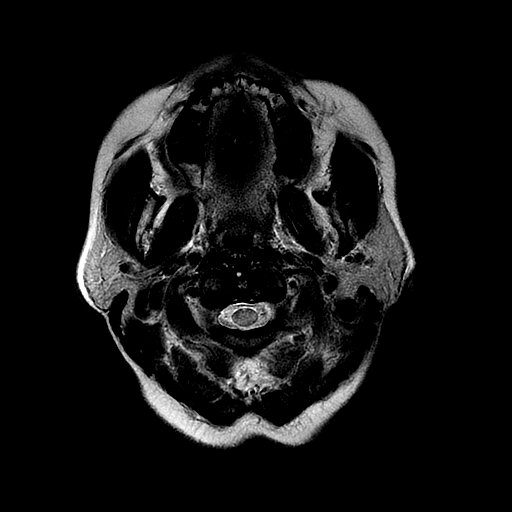

[Series 7: FLAIR · axial · 5.0mm · 0.43mm/px · 1 of 23 slices shown (2 of 2)]
[im 1/23]
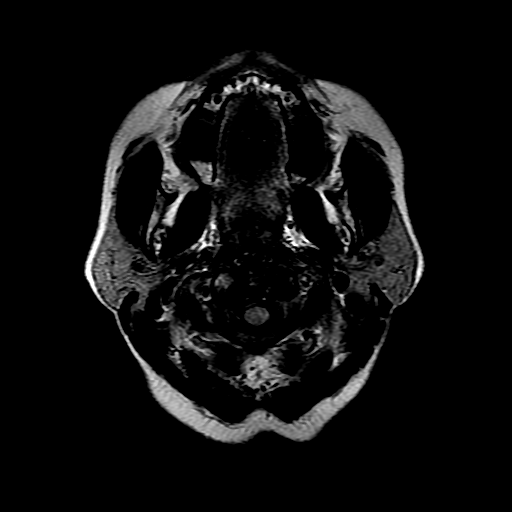

[Series 8: DWI · coronal · 5.0mm · 1.09mm/px · 3 of 66 slices shown (2 of 4)]
[im 1/66]
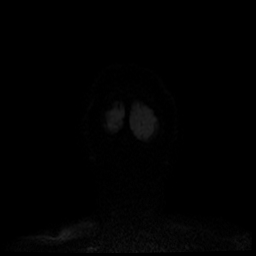
[im 33/66]
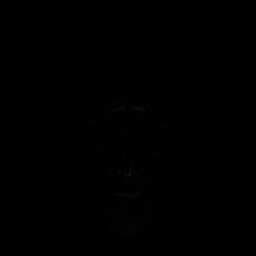
[im 66/66]
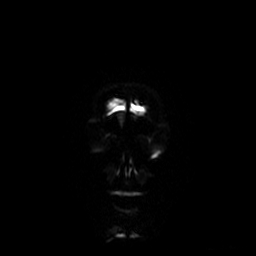

[Series 11: T2 · coronal · 5.0mm · 0.43mm/px · 1 of 34 slices shown (2 of 2)]
[im 1/34]
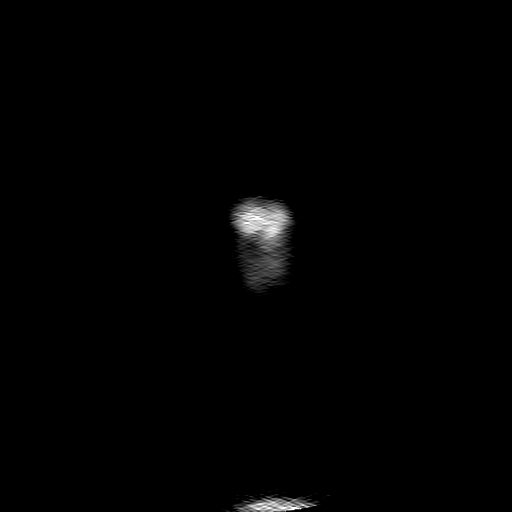

[Series 13: T1 post-contrast · coronal · 5.0mm · 0.43mm/px · 2 of 34 slices shown (1 of 2)]
[im 1/34]
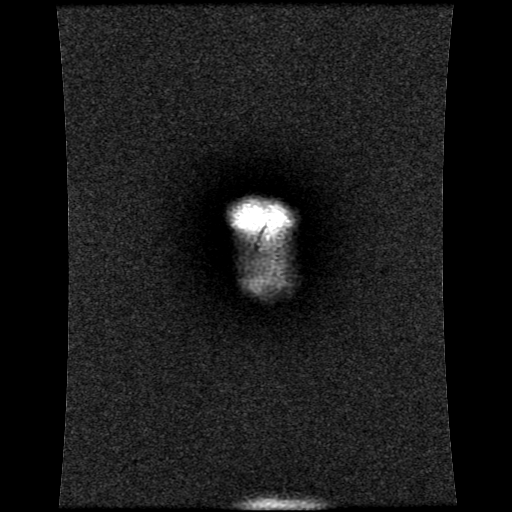
[im 34/34]
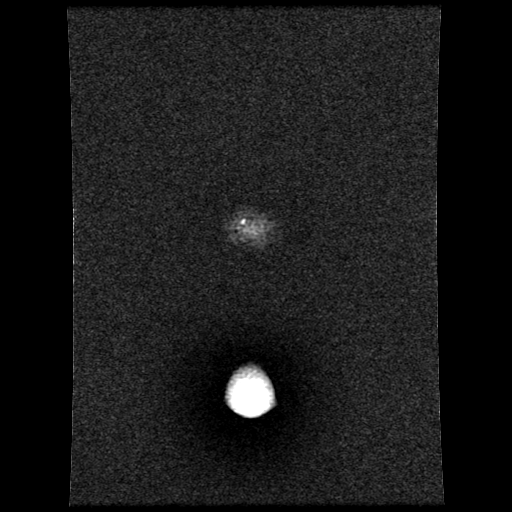

[Series 14: T1 post-contrast · sagittal · 5.0mm · 0.47mm/px · 1 of 23 slices shown (2 of 2)]
[im 1/23]
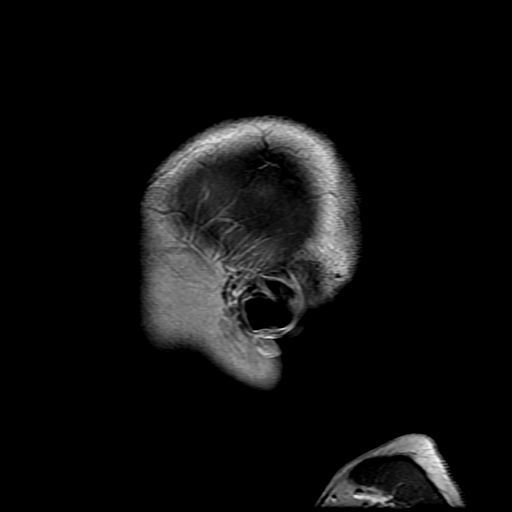

[Series 400: DWI · axial · 3.0mm · 1.09mm/px · z∈[-67,+65]mm · 2 of 45 slices shown (3 of 4)]
[im 1/45]
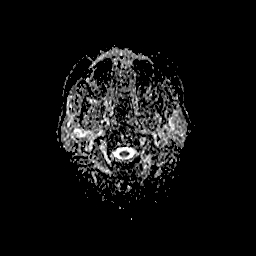
[im 45/45]
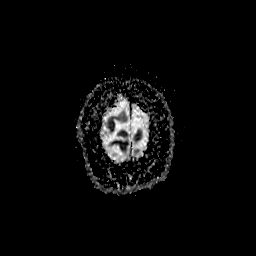

[Series 800: DWI · coronal · 5.0mm · 1.09mm/px · 2 of 33 slices shown (4 of 4)]
[im 1/33]
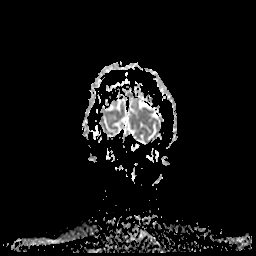
[im 33/33]
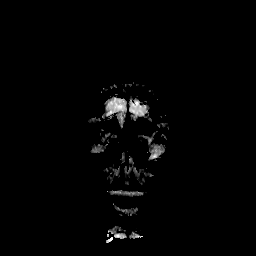

[26 of 48 positions shown; findings below may reference images not displayed]

FINDINGS: Calvarium and upper cervical spine: No focal marrow signal
abnormality.

Orbits: No significant findings.

Sinuses and Mastoids: Clear. Mastoid and middle ears are clear.

Brain: There is a 6 mm T1 intrinsically hyperintense nodule with
apparent fluid debris level on T2 weighted imaging, located along
the ependymal margin of the left lateral ventricle atrium. No mass
or hemorrhage seen in this region on 03/12/2015 head CT. No mention
of nodule in this region on report from [REDACTED] 08/02/2014 (images not
available). There is no neighboring enhancement or other nodule.

Confluent white matter disease throughout the bilateral cerebral
hemispheres, most extensive in the periventricular white matter but
also present in the deep and juxta cortical white matter. Black
holes present radiating from the body of the right lateral
ventricle. Less extensive FLAIR hyperintensity present in the pons
and likely in the upper cervical cord. Changes are consistent with
patient's history of multiple sclerosis, and are extensive. There is
associated cerebral volume loss and corpus callosum thinning. No
associated restricted diffusion or enhancement.

No acute infarct, major vessel occlusion, mass effect, or
obstructive hydrocephalus.

These results were called by telephone at the time of interpretation
on 03/13/2015 at [DATE] to Dr. Raoul Rommel, who verbally acknowledged
these results.
IMPRESSION: 1. 6 mm ependymal based nodule at the atrium of the left lateral
ventricle with appearance suggesting recent hemorrhage, but not seen
by CT yesterday. Suggest repeat head CT and outpatient follow-up to
correlate with outside MRs.
2. Advanced multiple sclerosis with corpus callosum thinning. No
associated enhancement or diffusion restriction.

## 2017-02-22 ENCOUNTER — Telehealth: Payer: Self-pay | Admitting: Internal Medicine

## 2017-02-22 DIAGNOSIS — I10 Essential (primary) hypertension: Secondary | ICD-10-CM

## 2017-02-22 MED ORDER — AMLODIPINE BESYLATE 5 MG PO TABS: ORAL_TABLET | ORAL | 3 refills | Status: DC

## 2017-02-22 NOTE — Telephone Encounter (Signed)
noted 

## 2017-02-22 NOTE — Telephone Encounter (Signed)
FYI: Patient as refused palliative care service.

## 2017-02-22 NOTE — Telephone Encounter (Signed)
Pt would like a refill of amLODipine (NORVASC) 5 MG tablet   [facility] mail Order

## 2017-03-04 ENCOUNTER — Ambulatory Visit
Admission: RE | Admit: 2017-03-04 | Discharge: 2017-03-04 | Disposition: A | Discharge status: Home / Self Care | Payer: Medicare HMO | Source: Ambulatory Visit | Attending: Neurology | Admitting: Neurology

## 2017-03-04 ENCOUNTER — Other Ambulatory Visit: Payer: Self-pay | Admitting: *Deleted

## 2017-03-04 ENCOUNTER — Telehealth: Payer: Self-pay | Admitting: Internal Medicine

## 2017-03-04 DIAGNOSIS — R05 Cough: Secondary | ICD-10-CM

## 2017-03-04 DIAGNOSIS — R058 Other specified cough: Secondary | ICD-10-CM

## 2017-03-04 DIAGNOSIS — R509 Fever, unspecified: Secondary | ICD-10-CM

## 2017-03-04 DIAGNOSIS — R062 Wheezing: Secondary | ICD-10-CM

## 2017-03-04 NOTE — Telephone Encounter (Signed)
Spoke with pt's daughter. Changes have been made to [facility] and have been faxed to [facility].

## 2017-03-04 NOTE — Telephone Encounter (Signed)
Daughter is in in town working getting mom into home.  She has question about the FL2 form and would like someone to call her asap.  This is the last piece they are needing fix to get her in  Best number  [phone]

## 2017-03-04 NOTE — Telephone Encounter (Signed)
Daughter called back in concern of FL2 completion forms.  Wanted to know if she should be looking for a memory care unity or a skilled nursing facility for patient.  After speaking with Terence Lux daughter needs to be looking for a skilled nursing facility based on patient is not diagnosis with dementia.  Need to know if daughter needs skilled nursing facility marked on FL2 and/ or any other changes. On current FL2 "other" was marked at the request of the daughter.   I have left VM for daughter to give me a call back.

## 2017-03-05 ENCOUNTER — Telehealth: Payer: Self-pay | Admitting: *Deleted

## 2017-03-05 NOTE — Telephone Encounter (Signed)
I have spoken with Wanda [name] this morning, and per RAS, advised CXR showed no pneumonia--can r/s Ocrevus once fever is gone. She verbalized understanding of same, sts. temp has been down today.  Message printed and given to [name] in the infusion suite that it should be ok to r/s her next wk./fim

## 2017-03-05 NOTE — Telephone Encounter (Signed)
-----   Message from Britt Bottom, MD sent at 03/04/2017  5:02 PM EDT ----- Please let her know that the x-ray does not show any pneumonia. Once fevers resolved, she can get the next dose of the ocrelizumab

## 2017-03-05 NOTE — Telephone Encounter (Signed)
Pt daughter called and would like a copy of her mothers FL2 faxed to herself    Fax # [phone]

## 2017-03-08 NOTE — Telephone Encounter (Signed)
Forms have been faxed 

## 2017-03-25 ENCOUNTER — Emergency Department (HOSPITAL_COMMUNITY)
Admission: EM | Admit: 2017-03-25 | Discharge: 2017-03-25 | Disposition: A | Discharge status: Home / Self Care | Payer: Medicare HMO | Attending: Emergency Medicine | Admitting: Emergency Medicine

## 2017-03-25 ENCOUNTER — Emergency Department (HOSPITAL_COMMUNITY): Payer: Medicare HMO

## 2017-03-25 DIAGNOSIS — M25552 Pain in left hip: Secondary | ICD-10-CM | POA: Diagnosis not present

## 2017-03-25 DIAGNOSIS — Z79899 Other long term (current) drug therapy: Secondary | ICD-10-CM | POA: Insufficient documentation

## 2017-03-25 DIAGNOSIS — Y939 Activity, unspecified: Secondary | ICD-10-CM | POA: Insufficient documentation

## 2017-03-25 DIAGNOSIS — S3992XA Unspecified injury of lower back, initial encounter: Secondary | ICD-10-CM | POA: Diagnosis present

## 2017-03-25 DIAGNOSIS — I10 Essential (primary) hypertension: Secondary | ICD-10-CM | POA: Insufficient documentation

## 2017-03-25 DIAGNOSIS — W19XXXA Unspecified fall, initial encounter: Secondary | ICD-10-CM | POA: Diagnosis not present

## 2017-03-25 DIAGNOSIS — Y929 Unspecified place or not applicable: Secondary | ICD-10-CM | POA: Insufficient documentation

## 2017-03-25 DIAGNOSIS — M25562 Pain in left knee: Secondary | ICD-10-CM | POA: Diagnosis not present

## 2017-03-25 DIAGNOSIS — Y999 Unspecified external cause status: Secondary | ICD-10-CM | POA: Diagnosis not present

## 2017-03-25 DIAGNOSIS — Z87891 Personal history of nicotine dependence: Secondary | ICD-10-CM | POA: Insufficient documentation

## 2017-03-25 DIAGNOSIS — M5432 Sciatica, left side: Secondary | ICD-10-CM | POA: Diagnosis not present

## 2017-03-25 MED ORDER — OXYCODONE HCL 5 MG PO TABS
5.0000 mg | ORAL_TABLET | Freq: Once | ORAL | Status: AC
  Administered 2017-03-25: 5 mg via ORAL
  Filled 2017-03-25: qty 1

## 2017-03-25 MED ORDER — KETOROLAC TROMETHAMINE 60 MG/2ML IM SOLN
15.0000 mg | Freq: Once | INTRAMUSCULAR | Status: AC
  Administered 2017-03-25: 15 mg via INTRAMUSCULAR
  Filled 2017-03-25: qty 2

## 2017-03-25 MED ORDER — ACETAMINOPHEN 500 MG PO TABS
1000.0000 mg | ORAL_TABLET | Freq: Once | ORAL | Status: AC
  Administered 2017-03-25: 1000 mg via ORAL
  Filled 2017-03-25: qty 2

## 2017-03-25 MED ORDER — DIAZEPAM 2 MG PO TABS
2.0000 mg | ORAL_TABLET | Freq: Once | ORAL | Status: AC
  Administered 2017-03-25: 2 mg via ORAL
  Filled 2017-03-25: qty 1

## 2017-03-25 NOTE — Progress Notes (Signed)
CSW spoke with patient regarding discharge plans. Patient is currently living home alone and is seeking assisted living placement at this time. CSW informed patient that the ED is unable to assist with assisted living placement if stable for discharge however assistance at home can be provided to family/patient. [facility] services including a Education officer, museum to help patient and family with placement into assisted living will be set up at discharge. Patient stated understanding and is agreeable to this plan. Patient appreciated CSW. CSW updated RN CM and EDP of discharge plans.   Kingsley Spittle, LCSWA Clinical Social Worker [phone]

## 2017-03-25 NOTE — ED Triage Notes (Addendum)
Pt is from home alone with c/o left sided pain and weakness. Pt has hx of MS and right sided weakness . Pt fell yesterday at home and denies LOC, blood thinners, and striking head. Pt states that she is in transition to find a Assisted Living facility to live because MS is advancing. Pt reports recently becoming incontinent.

## 2017-03-25 NOTE — ED Notes (Signed)
Bed: WA02 Expected date: 03/25/17 Expected time: 11:55 AM Means of arrival: Ambulance Comments: Leg pain MS history

## 2017-03-25 NOTE — ED Provider Notes (Signed)
[facility] DEPT Provider Note   CSN: [id number] Arrival date & time: 03/25/17  1204     History   Chief Complaint Chief Complaint  Patient presents with  . Leg Pain  . Fatigue    HPI Wanda [name] is a 66 y.o. female.  66 yo F with a significant past medical history of MS comes in with a chief complaint of left sided low back pain that radiates down to her foot. Going on for the past week or so. Patient has a unsteady gait at baseline and has required significant assistance at home. Over the past week with the pain she is now having difficulty doing activities of daily living. She feels that she has trouble making meals for herself or making it to the bathroom on time. Denies fevers or chills. Denies trauma. Denies change in bowel or bladder. Denies loss of perirectal sensation.    Leg Pain   This is a new problem. The current episode started more than 1 week ago. The problem occurs constantly. The problem has been gradually worsening. The pain is present in the back. The quality of the pain is described as sharp. The pain is at a severity of 10/10. The pain is severe. She has tried nothing for the symptoms. The treatment provided no relief. There has been no history of extremity trauma.    Past Medical History:  Diagnosis Date  . Acute encephalopathy 03/12/2015  . Anemia   . Anxiety   . Arthritis   . Cervical dysplasia   . Depression   . Elevated cholesterol   . Hypertension   . Macular degeneration of right eye 2001  . MS (multiple sclerosis) ([place]) 08/2013    Patient Active Problem List   Diagnosis Date Noted  . Chronic pain syndrome 02/03/2017  . Right sided sciatica 09/16/2016  . Left elbow pain 07/15/2016  . B12 deficiency 07/15/2016  . 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 Urinary frequency 03/05/2016  . Bilateral shoulder bursitis 03/05/2016  . Other fatigue 12/18/2015  . Urinary hesitancy 12/18/2015  . Polypharmacy   . Cataract   . Right  sided weakness   . Gait difficulty 08/10/2014  . Multiple sclerosis ([place]) 10/25/2013  . Insomnia 03/23/2013  . Fibroids 12/29/2012  . Elevated cholesterol   . Hypertension   . Arthritis   . Cervical dysplasia     Past Surgical History:  Procedure Laterality Date  . CHOLECYSTECTOMY    . COLONOSCOPY W/ POLYPECTOMY    . COLPOSCOPY    . KNEE SURGERY Bilateral    "had cortisone injections in my knees"  . TONSILLECTOMY    . TUBAL LIGATION      OB History    Gravida Para Term Preterm AB Living   6 3 3   3 2    SAB TAB Ectopic Multiple Live Births   3               Home Medications    Prior to Admission medications   Medication Sig Start Date End Date Taking? Authorizing Provider  amLODipine (NORVASC) 5 MG tablet take 1 tablet by mouth once daily FOR ESSENTIAL HYPERTENSION. 02/22/17  Yes Burns, Claudina Lick, MD  baclofen (LIORESAL) 10 MG tablet Take 1 by mouth every morning, 1 by mouth every afternoon, 2 by mouth daily at bedtime. 08/04/16  Yes Sater, Nanine Means, MD  Cholecalciferol (VITAMIN D3) 2000 UNITS capsule Take 2,000 Units by mouth daily.   Yes [provider]  ibuprofen (  ADVIL,MOTRIN) 200 MG tablet Take 800 mg by mouth 2 (two) times daily as needed.   Yes [provider]  lamoTRIgine (LAMICTAL) 100 MG tablet Take 1 tablet (100 mg total) by mouth at bedtime. 01/07/17  Yes Sater, Nanine Means, MD  ocrelizumab 600 mg in sodium chloride 0.9 % 500 mL Inject 600 mg into the vein every 6 (six) months.   Yes [provider]  Omega-3 Fatty Acids (OMEGA 3 500 PO) Take 500 mg by mouth 2 (two) times daily. Reported on 02/18/2016   Yes [provider]  oxybutynin (DITROPAN) 5 MG tablet Take 2 tablets (10 mg total) by mouth 2 (two) times daily. 01/07/17  Yes Sater, Nanine Means, MD  traZODone (DESYREL) 100 MG tablet take 1 to 1 1/2  pills a day for SLEEP. Patient taking differently: Take 200 mg by mouth at bedtime.  08/04/16  Yes Sater, Nanine Means, MD    Family  History Family History  Problem Relation Age of Onset  . Cancer Mother        Colon  . Diabetes Mother   . Hypertension Mother   . Arthritis Mother   . Cancer Father        prostate  . Kidney disease Brother        congenital single kidney  . Arthritis Sister   . Arthritis Sister   . Hematuria Son   . Gout Brother   . Multiple sclerosis Brother   . Arthritis Brother   . HIV Brother   . Cancer Brother        spinal    Social History Social History  Substance Use Topics  . Smoking status: Former Smoker    Packs/day: 1.00    Years: 45.00    Types: Cigarettes    Quit date: 10/23/2013  . Smokeless tobacco: Never Used     Comment: Quit smoking in 2014.  Marland Kitchen Alcohol use No     Comment: hasn't drank anything since jan 2015     Allergies   Patient has no known allergies.   Review of Systems Review of Systems  Constitutional: Negative for chills and fever.  HENT: Negative for congestion and rhinorrhea.   Eyes: Negative for redness and visual disturbance.  Respiratory: Negative for shortness of breath and wheezing.   Cardiovascular: Negative for chest pain and palpitations.  Gastrointestinal: Negative for nausea and vomiting.  Genitourinary: Negative for dysuria and urgency.  Musculoskeletal: Positive for arthralgias, back pain, gait problem and myalgias.  Skin: Negative for pallor and wound.  Neurological: Negative for dizziness and headaches.     Physical Exam Updated Vital Signs BP 123/88 (BP Location: Right Arm)   Pulse 66   Temp 98.7 F (37.1 C) (Oral)   Resp 20   Ht 5\' 5"  (1.651 m)   Wt 57.6 kg (127 lb)   SpO2 99%   BMI 21.13 kg/m   Physical Exam  Constitutional: She is oriented to person, place, and time. She appears well-developed and well-nourished. No distress.  HENT:  Head: Normocephalic and atraumatic.  Eyes: EOM are normal. Pupils are equal, round, and reactive to light.  Neck: Normal range of motion. Neck supple.  Cardiovascular: Normal rate  and regular rhythm.  Exam reveals no gallop and no friction rub.   No murmur heard. Pulmonary/Chest: Effort normal. She has no wheezes. She has no rales.  Abdominal: Soft. She exhibits no distension and no mass. There is no tenderness. There is no guarding.  Musculoskeletal: She exhibits  tenderness. She exhibits no edema.  Mild tenderness to the left SI joint. Pulse motor and sensation is intact distally. Negative straight leg raise test.  Neurological: She is alert and oriented to person, place, and time.  Skin: Skin is warm and dry. She is not diaphoretic.  Psychiatric: She has a normal mood and affect. Her behavior is normal.  Nursing note and vitals reviewed.    ED Treatments / Results  Labs (all labs ordered are listed, but only abnormal results are displayed) Labs Reviewed  URINALYSIS, ROUTINE W REFLEX MICROSCOPIC    EKG  EKG Interpretation None       Radiology Dg Hip Unilat W Or Wo Pelvis 2-3 Views Left  Result Date: 03/25/2017 CLINICAL DATA:  [place] last evening when the left leg gave out. The patient reports low back and left hip and knee pain. EXAM: DG HIP (WITH OR WITHOUT PELVIS) 2-3V LEFT COMPARISON:  No recent studies in [facility] FINDINGS: The bony pelvis is subjectively adequately mineralized. A soft tissue calcification overlying the right sacral ala is compatible with a fibroid. There are numerous pelvic phleboliths. There is moderate narrowing of the right hip joint space. The left hip joint space is well maintained. The acetabulum and left femoral head are unremarkable. The femoral neck, intertrochanteric, and sub trochanteric region appear normal. IMPRESSION: There is no acute pelvic fracture nor acute abnormality of the left hip. There is moderate degenerative change of the right hip. Electronically Signed   By: David  Martinique M.D.   On: 03/25/2017 13:39    Procedures Procedures (including critical care time)  Medications Ordered in ED Medications  acetaminophen  (TYLENOL) tablet 1,000 mg (1,000 mg Oral Given 03/25/17 1441)  oxyCODONE (Oxy IR/ROXICODONE) immediate release tablet 5 mg (5 mg Oral Given 03/25/17 1441)  ketorolac (TORADOL) injection 15 mg (15 mg Intramuscular Given 03/25/17 1443)  diazepam (VALIUM) tablet 2 mg (2 mg Oral Given 03/25/17 1441)     Initial Impression / Assessment and Plan / ED Course  I have reviewed the triage vital signs and the nursing notes.  Pertinent labs & imaging results that were available during my care of the patient were reviewed by me and considered in my medical decision making (see chart for details).     66 yo F With a chief complaint of left-sided sciatica. However the patient has MS and RD had a baseline difficulty of gait at home. She states that it takes her over and hour to get into bed. At this point she feels like she can no longer take care of herself at home. Social work consulted.   Social work recommends home health.    3:03 PM:  I have discussed the diagnosis/risks/treatment options with the patient and family and believe the pt to be eligible for discharge home to follow-up with PCP. We also discussed returning to the ED immediately if new or worsening sx occur. We discussed the sx which are most concerning (e.g., sudden worsening pain, fever, inability to tolerate by mouth) that necessitate immediate return. Medications administered to the patient during their visit and any new prescriptions provided to the patient are listed below.  Medications given during this visit Medications  acetaminophen (TYLENOL) tablet 1,000 mg (1,000 mg Oral Given 03/25/17 1441)  oxyCODONE (Oxy IR/ROXICODONE) immediate release tablet 5 mg (5 mg Oral Given 03/25/17 1441)  ketorolac (TORADOL) injection 15 mg (15 mg Intramuscular Given 03/25/17 1443)  diazepam (VALIUM) tablet 2 mg (2 mg Oral Given 03/25/17 1441)  The patient appears reasonably screen and/or stabilized for discharge and I doubt any other medical condition  or other [facility] requiring further screening, evaluation, or treatment in the ED at this time prior to discharge.    Final Clinical Impressions(s) / ED Diagnoses   Final diagnoses:  Sciatica of left side    New Prescriptions New Prescriptions   No medications on file     Deno Etienne, DO 03/25/17 1503

## 2017-03-25 NOTE — Discharge Instructions (Signed)
Take 4 over the counter ibuprofen tablets 3 times a day or 2 over-the-counter naproxen tablets twice a day for pain. Also take tylenol 1000mg(2 extra strength) four times a day.    

## 2017-03-25 NOTE — Discharge Planning (Signed)
Wanda [name] [name], RN, BSN, [place] [phone] EDSW Spoke with pt at bedside regarding discharge planning for [facility]. Offered pt list of home health agencies to choose from.  Pt chose [facility] to render services. Brad of [facility] notified. Patient made aware that [facility] will be in contact in 24-48 hours.  No DME needs identified at this time.

## 2017-04-19 ENCOUNTER — Telehealth: Payer: Self-pay | Admitting: Neurology

## 2017-04-19 NOTE — Telephone Encounter (Signed)
I have spoken with Wanda [name] this afternoon.  She sts. she is now residing at [facility]. phone # [phone]

## 2017-04-19 NOTE — Telephone Encounter (Signed)
Patient called office in reference to being at an assisted living facility.  Patient said she is getting her medications, but not getting the right amount of medication for baclofen (LIORESAL) 10 MG tablet and oxybutynin (DITROPAN) 5 MG tablet .  Patient went to the hospital 03/25/17 due to sciatica pain and was advised patient should be taking Tylenol 3. Facility name [facility].  Please call

## 2017-04-28 DIAGNOSIS — F332 Major depressive disorder, recurrent severe without psychotic features: Secondary | ICD-10-CM | POA: Diagnosis not present

## 2017-04-28 DIAGNOSIS — F419 Anxiety disorder, unspecified: Secondary | ICD-10-CM | POA: Diagnosis not present

## 2017-04-28 DIAGNOSIS — F250 Schizoaffective disorder, bipolar type: Secondary | ICD-10-CM | POA: Diagnosis not present

## 2017-05-05 DIAGNOSIS — R5383 Other fatigue: Secondary | ICD-10-CM | POA: Diagnosis not present

## 2017-05-05 DIAGNOSIS — Z139 Encounter for screening, unspecified: Secondary | ICD-10-CM | POA: Diagnosis not present

## 2017-05-09 DIAGNOSIS — R5383 Other fatigue: Secondary | ICD-10-CM | POA: Diagnosis not present

## 2017-05-10 DIAGNOSIS — M6281 Muscle weakness (generalized): Secondary | ICD-10-CM | POA: Diagnosis not present

## 2017-05-10 DIAGNOSIS — M129 Arthropathy, unspecified: Secondary | ICD-10-CM | POA: Diagnosis not present

## 2017-05-10 DIAGNOSIS — G894 Chronic pain syndrome: Secondary | ICD-10-CM | POA: Diagnosis not present

## 2017-05-10 DIAGNOSIS — G35 Multiple sclerosis: Secondary | ICD-10-CM | POA: Diagnosis not present

## 2017-07-01 ENCOUNTER — Ambulatory Visit (INDEPENDENT_AMBULATORY_CARE_PROVIDER_SITE_OTHER): Payer: Medicare Other | Admitting: Neurology

## 2017-07-01 ENCOUNTER — Encounter: Payer: Self-pay | Admitting: Neurology

## 2017-07-01 VITALS — BP 128/82 | HR 92 | Resp 18 | Ht 65.0 in | Wt 130.0 lb

## 2017-07-01 DIAGNOSIS — R269 Unspecified abnormalities of gait and mobility: Secondary | ICD-10-CM

## 2017-07-01 DIAGNOSIS — M7551 Bursitis of right shoulder: Secondary | ICD-10-CM

## 2017-07-01 DIAGNOSIS — R350 Frequency of micturition: Secondary | ICD-10-CM | POA: Diagnosis not present

## 2017-07-01 DIAGNOSIS — R531 Weakness: Secondary | ICD-10-CM | POA: Diagnosis not present

## 2017-07-01 DIAGNOSIS — G35 Multiple sclerosis: Secondary | ICD-10-CM | POA: Diagnosis not present

## 2017-07-01 DIAGNOSIS — M7552 Bursitis of left shoulder: Secondary | ICD-10-CM | POA: Diagnosis not present

## 2017-07-01 DIAGNOSIS — R5383 Other fatigue: Secondary | ICD-10-CM | POA: Diagnosis not present

## 2017-07-01 NOTE — Progress Notes (Signed)
[facility]  PATIENT: Wanda [name] DOB: [birth year]  REFERRING DOCTOR OR PCP:  patient SOURCE: patient and records in EMR, labs/imaging reports, MRI images on PACS  _________________________________   HISTORICAL  CHIEF COMPLAINT:  Chief Complaint  Patient presents with  . Multiple Sclerosis    Had parts A and B of first Ocrevus infusion on 02/18/17 and 03/10/17.  Sts. is having more difficulty with night time congestion and wants to know if [facility] can rx. Mucinex./fim  . Procedure    HISTORY OF PRESENT ILLNESS:  Wanda [name] is a 66 year old woman with a history of multiple sclerosis.     Update 07/01/2017:  She had her initial ocrelizumab and fusion 02/18/2017 and 03/10/2017. She tolerated the infusion well. She has not had any exacerbations since starting ocrelizumab.    She has switched to ocrelizumab from Tysabri after an exacerbation with gait and leg issues (MRI of the brain was unchanged consistent with her known MS).     She feels her gait is doing about the same.  She uses a walker and can walk long distances with her walker.   She feels much safer walking with it.     She notes her right leg has slowly weakened over the past year.  Baclofen helps spasticity some but incompletely.   Lamotrigine helps her dysesthetic pain.   She had some shoulder pain that improved after she got shots.    Fatigue is daily .   Physical fatigue is worse than mental fatigue.  She sleeps better since starting trazodone.   Bladder urgency  is doing better on the oxybutynin.   She tolerates it well.  _____________________________ From 12/23/2016   In January, She had more issues with her gait and mild leg weakness. Additionally the right arm seems a little bit weak. She received several days of IV Solu-Medrol    An MRI of the brain 10/2016 showed multiple chronic demyelinating plaque and atrophy. It was essentially unchanged from her previous MRI. The MRI of the cervical spine  showed two lesions within the spinal cord, the largest adjacent to C2.  MS:   She is on Tysabri and tolerates it well.   She denies any exacerbations.   She is JCV Ab low positive (0.66) when tested in September 2017.  Gait/strength/sensation:    She feels balance is worse and she She feels legs are mildly weaker.   The right hand seems weaker and is more clumsy.   She uses a walker.   She takes  Baclofen 20 mg and 20 mg at night.   She has dysesthetic pain in the hands, R > L, better with Lamotrigine.   Her left anterolateral thigh dysesthesia is much better.     Joint pain:   She notes more pain in her joints.    She  Bladder:  She has less urinary frequency and urgency on oxybutynin  Vision:   She denies any significant vision problem due to the MS now but had diplopia in past  Fatigue/sleep: She has wore fatigue.   She gets tired after just a little activity (i.e. Fixing breakfast). She is sleeping much better with trazodone 50 mg at bedtime and has no hangover.    She also often takes trazodone before a nap.     She has slept poorly x 2 nights due to her pain.   Mood/cognition: She denies any significant depression. She does note some anxiety. She had been prescribed lamotrigine and she takes  100 mg nightly as a mood stabilizer  Shoulder Pain:   She has less right shoulder pain since the subacromial bursa injection last month.          Right buttock pain:   She also reports less pain in th right buttock since the shot.    MS History:   In late 2014, she woke up one day unable to get out of bed due to weakness. There is on both sides of her body and she had clumsiness. MRI of the brain showed multiple plaques worrisome for MS. She had a follow-up contrasted MRI of the brain as well as the cervical spine showing multiple lesions, many of which enhance, including some in the infratentorial region. She was started on Tysabri as her first drug due to the aggressiveness of the multiple sclerosis.  She has continued to be on Tysabri with monthly infusions. She is JCV antibody low positive.     I personally reviewed her last MRI from 03/13/2015 showing multiple T2/FLAIR hyperintense foci, many of which are periventricular. She has some cortical atrophy. There is also a focus adjacent to C2 and some in the pons.   Recent lab results were also reviewed.   REVIEW OF SYSTEMS: Constitutional: No fevers, chills, sweats, or change in appetite.  She has fatigue and insomnia Eyes: No visual changes, double vision, eye pain Ear, nose and throat: No hearing loss, ear pain, nasal congestion, sore throat Cardiovascular: No chest pain, palpitations Respiratory: No shortness of breath at rest or with exertion.   No wheezes GastrointestinaI: No nausea, vomiting, diarrhea, abdominal pain, fecal incontinence.   Recent C. difficile diarrhea Genitourinary: She has urinary hesitancy and does not completely empties the bladder. She also has urinary urgency and frequency. Musculoskeletal: No neck pain, back pain Integumentary: No rash, pruritus, skin lesions Neurological: as above Psychiatric: No depression at this time.  Some anxiety Endocrine: No palpitations, diaphoresis, change in appetite, change in weigh or increased thirst Hematologic/Lymphatic: No anemia, purpura, petechiae. Allergic/Immunologic: No itchy/runny eyes, nasal congestion, recent allergic reactions, rashes  ALLERGIES: No Known Allergies  HOME MEDICATIONS:  Current Outpatient Prescriptions:  .  amLODipine (NORVASC) 5 MG tablet, take 1 tablet by mouth once daily FOR ESSENTIAL HYPERTENSION., Disp: 90 tablet, Rfl: 3 .  baclofen (LIORESAL) 10 MG tablet, Take 1 by mouth every morning, 1 by mouth every afternoon, 2 by mouth daily at bedtime., Disp: 360 each, Rfl: 3 .  Cholecalciferol (VITAMIN D3) 2000 UNITS capsule, Take 2,000 Units by mouth daily., Disp: , Rfl:  .  ibuprofen (ADVIL,MOTRIN) 200 MG tablet, Take 800 mg by mouth 2 (two) times  daily as needed., Disp: , Rfl:  .  lamoTRIgine (LAMICTAL) 100 MG tablet, Take 1 tablet (100 mg total) by mouth at bedtime., Disp: 90 tablet, Rfl: 1 .  ocrelizumab 600 mg in sodium chloride 0.9 % 500 mL, Inject 600 mg into the vein every 6 (six) months., Disp: , Rfl:  .  Omega-3 Fatty Acids (OMEGA 3 500 PO), Take 500 mg by mouth 2 (two) times daily. Reported on 02/18/2016, Disp: , Rfl:  .  oxybutynin (DITROPAN) 5 MG tablet, Take 2 tablets (10 mg total) by mouth 2 (two) times daily., Disp: 360 tablet, Rfl: 3 .  traZODone (DESYREL) 100 MG tablet, take 1 to 1 1/2  pills a day for SLEEP. (Patient taking differently: Take 200 mg by mouth at bedtime. ), Disp: 135 tablet, Rfl: 3  PAST MEDICAL HISTORY: Past Medical History:  Diagnosis Date  .  Acute encephalopathy 03/12/2015  . Anemia   . Anxiety   . Arthritis   . Cervical dysplasia   . Depression   . Elevated cholesterol   . Hypertension   . Macular degeneration of right eye 2001  . MS (multiple sclerosis) ([place]) 08/2013    PAST SURGICAL HISTORY: Past Surgical History:  Procedure Laterality Date  . CHOLECYSTECTOMY    . COLONOSCOPY W/ POLYPECTOMY    . COLPOSCOPY    . KNEE SURGERY Bilateral    "had cortisone injections in my knees"  . TONSILLECTOMY    . TUBAL LIGATION      FAMILY HISTORY: Family History  Problem Relation Age of Onset  . Cancer Mother        Colon  . Diabetes Mother   . Hypertension Mother   . Arthritis Mother   . Cancer Father        prostate  . Kidney disease Brother        congenital single kidney  . Arthritis Sister   . Arthritis Sister   . Hematuria Son   . Gout Brother   . Multiple sclerosis Brother   . Arthritis Brother   . HIV Brother   . Cancer Brother        spinal    SOCIAL HISTORY:  Social History   Social History  . Marital status: Widowed    Spouse name: N/A  . Number of children: 2  . Years of education: college   Occupational History  . retired     Social History Main Topics  .  Smoking status: Former Smoker    Packs/day: 1.00    Years: 45.00    Types: Cigarettes    Quit date: 10/23/2013  . Smokeless tobacco: Never Used     Comment: Quit smoking in 2014.  Marland Kitchen Alcohol use No     Comment: hasn't drank anything since jan 2015  . Drug use: No  . Sexual activity: No   Other Topics Concern  . Not on file   Social History Narrative   Grew up in [place] area, 1 of 10 siblings - 84 still living, married for 33 years then divorced, moved to [place] to be near daughter post retirement, als has 1 son. 2 dogs.       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      PHYSICAL EXAM  Vitals:   07/01/17 1006  BP: 128/82  Pulse: 92  Resp: 18  Weight: 130 lb (59 kg)  Height: 5\' 5"  (1.651 m)    Body mass index is 21.63 kg/m.   General: The patient is well-developed and well-nourished and in no acute distress  Musculoskeletal:  The neck and back are nontender today. She has mild tenderness over the subacromial bursa on the left.   Neurologic Exam  Mental status: The patient is alert and oriented x 3 at the time of the examination. The patient has apparent normal recent and remote memory, with an apparently normal attention span and concentration ability.   Speech is normal.  Cranial nerves: Extraocular movements are full.facial strength and sensation is normal. Trapezius strength is normal. The tongue is midline, and the patient has symmetric elevation of the soft palate. No obvious hearing deficits are noted.  Motor:  Muscle bulk is normal.   Muscle tone is increased on the right more than the left, leg more than arm. Strength is 4/5 in the right arm and 5/5 in the  left arm. Strength is 3-4-/5 in the right leg and 4-4+/5 in the left leg.     Sensory:  She reported symmetric sensation to touch and vibration in the arms. There was mildly reduced touch and vibration sensation in the right leg relative to the left leg..  Coordination: Cerebellar  testing shows good left finger-nose-finger and mildly reduced right finger-nose-finger. Heel-to-shin is poor on the left and she can't do on the right.  Gait and station: She is able to walk without her walker but gait is wide and short period.  with the walker she does much better.   Cannot tandem. Romberg is positive.   Reflexes: Deep tendon reflexes are symmetric and normal bilaterally.        DIAGNOSTIC DATA (LABS, IMAGING, TESTING) - I reviewed patient records, labs, notes, testing and imaging myself where available.  Lab Results  Component Value Date   WBC 6.4 12/24/2016   HGB 12.7 12/24/2016   HCT 36.9 12/24/2016   MCV 84 12/24/2016   PLT 225 12/24/2016      Component Value Date/Time   NA 144 12/24/2016 1032   K 3.7 12/24/2016 1032   CL 103 12/24/2016 1032   CO2 20 12/24/2016 1032   GLUCOSE 117 (H) 12/24/2016 1032   GLUCOSE 90 06/30/2016 1100   BUN 13 12/24/2016 1032   CREATININE 0.93 12/24/2016 1032   CREATININE 0.98 06/30/2016 1100   CALCIUM 10.1 12/24/2016 1032   PROT 7.6 12/24/2016 1032   ALBUMIN 5.1 (H) 12/24/2016 1032   AST 16 12/24/2016 1032   ALT 11 12/24/2016 1032   ALKPHOS 87 12/24/2016 1032   BILITOT 0.2 12/24/2016 1032   GFRNONAA 64 12/24/2016 1032   GFRAA 74 12/24/2016 1032   Lab Results  Component Value Date   CHOL 286 (H) 06/30/2016   HDL 96 06/30/2016   LDLCALC 172 (H) 06/30/2016   TRIG 90 06/30/2016   CHOLHDL 3.0 06/30/2016   Lab Results  Component Value Date   HGBA1C 5.6 04/28/2016   Lab Results  Component Value Date   VITAMINB12 2,060 (H) 11/11/2015   Lab Results  Component Value Date   TSH 0.515 11/30/2015       ASSESSMENT AND PLAN  Multiple sclerosis ([place])  Gait difficulty  Right sided weakness  Urinary frequency  Bilateral shoulder bursitis  Other fatigue    1.    She will continue ocrelizumab. Her next infusion should be in mid November. She will check with the infusion staff before leaving today and  verify 2.    Continue lamotrigine for dysesthesias and oxybutynin for bladder. 4.    Continue baclofen, oxybutynin and trazodone 5.    She will return to see me in 5-6 months or sooner if there are new or worsening neurologic symptoms.   Wanda [name] A. Felecia Shelling, MD, PhD 0/35/2481, 85:90 AM Certified in Neurology, Clinical Neurophysiology, Sleep Medicine, Pain Medicine and Neuroimaging  [facility] [address] [name], [place] [address] [phone]

## 2017-07-09 ENCOUNTER — Telehealth: Payer: Self-pay | Admitting: Neurology

## 2017-07-09 NOTE — Telephone Encounter (Signed)
Patient called office in reference to infusion with our clinic 09/08/17.  Patient is now living in [facility] and would like to know if Dr. Maudry Diego can arrange to do her infusions [facility].  Please call

## 2017-07-09 NOTE — Telephone Encounter (Signed)
Spoke with Wanda [name] and advised we do not have an infusion ctr. in [place] to send her to.  However, if she has difficulty with transportation, can refer her to neuro at [facility] and they would be able to order Ocrevus there.  She verbalized understanding of same. Will discuss with dtr. Has infusion appt. here 10/5 and will discuss further at that time/fim

## 2017-07-14 ENCOUNTER — Other Ambulatory Visit: Payer: Self-pay | Admitting: Neurology

## 2017-07-14 DIAGNOSIS — G35 Multiple sclerosis: Secondary | ICD-10-CM

## 2017-07-19 ENCOUNTER — Telehealth: Payer: Self-pay | Admitting: Neurology

## 2017-07-19 DIAGNOSIS — G35 Multiple sclerosis: Secondary | ICD-10-CM

## 2017-07-19 MED ORDER — OXYBUTYNIN CHLORIDE 5 MG PO TABS: 10.0000 mg | ORAL_TABLET | Freq: Two times a day (BID) | ORAL | 3 refills | Status: DC

## 2017-07-19 MED ORDER — BACLOFEN 10 MG PO TABS: ORAL_TABLET | ORAL | 3 refills | Status: DC

## 2017-07-19 MED ORDER — OXYBUTYNIN CHLORIDE 5 MG PO TABS: 10.0000 mg | ORAL_TABLET | Freq: Two times a day (BID) | ORAL | 3 refills | Status: AC

## 2017-07-19 NOTE — Addendum Note (Signed)
Addended by: France Ravens I on: 07/19/2017 11:22 AM   Modules accepted: Orders

## 2017-07-19 NOTE — Telephone Encounter (Signed)
Pharmacist [facility] @[phone] @ [place] [phone] called stating pt no longer gets medication from  [place], [place] [phone] (Phone) [phone] (Fax)   Pt is now back home and wants to get medications from [facility].  Pt needs refills on baclofen (LIORESAL) 10 MG tablet   amLODipine (NORVASC) 5 MG tablet &    oxybutynin (DITROPAN) 5 MG tablet

## 2017-07-19 NOTE — Addendum Note (Signed)
Addended by: France Ravens I on: 07/19/2017 11:20 AM   Modules accepted: Orders

## 2017-07-19 NOTE — Telephone Encounter (Signed)
Baclofen and Ditropan escribed to [facility] has requested.  Norvasc is rx'd by pt's pcp and should be requested from that physician/fim

## 2017-07-20 ENCOUNTER — Telehealth: Payer: Self-pay | Admitting: *Deleted

## 2017-07-20 DIAGNOSIS — I10 Essential (primary) hypertension: Secondary | ICD-10-CM

## 2017-07-20 MED ORDER — AMLODIPINE BESYLATE 5 MG PO TABS: ORAL_TABLET | ORAL | 0 refills | Status: DC

## 2017-07-20 NOTE — Telephone Encounter (Signed)
Rec'd call from [facility] requesting refill on Amlodipine 5 mg take 1 tablet a day. Sent 30 day supply until pt appt Nov 6...Johny Chess

## 2017-08-06 ENCOUNTER — Ambulatory Visit: Payer: Medicare HMO | Admitting: Internal Medicine

## 2017-09-15 ENCOUNTER — Telehealth: Payer: Self-pay | Admitting: Neurology

## 2017-09-15 NOTE — Telephone Encounter (Signed)
Noted/fim 

## 2017-09-15 NOTE — Telephone Encounter (Signed)
Patient said she was just speaking to someone and got cut off regarding scheduling an infusion and said it was not the infusion department.

## 2017-09-15 NOTE — Telephone Encounter (Signed)
Patient called back and said it was the infusion department that called and I connected her there.  Please disregard previous message.

## 2017-10-06 ENCOUNTER — Telehealth: Payer: Self-pay | Admitting: Neurology

## 2017-10-06 NOTE — Telephone Encounter (Signed)
Spoke with Neoma Laming and explained [facility] does not have ordering privileges in [place], but can refer to neuro there that could order her infusions.  She verbalized understanding of same, will let me know if she would like referral/fim

## 2017-10-06 NOTE — Telephone Encounter (Signed)
Pt called she has recently moved to [facility] and would like a referral to get her infusions at [facility]. Please call to advise

## 2017-10-13 ENCOUNTER — Telehealth: Payer: Self-pay | Admitting: General Practice

## 2017-10-13 DIAGNOSIS — I10 Essential (primary) hypertension: Secondary | ICD-10-CM

## 2017-10-13 DIAGNOSIS — G4700 Insomnia, unspecified: Secondary | ICD-10-CM

## 2017-10-13 MED ORDER — AMLODIPINE BESYLATE 5 MG PO TABS: ORAL_TABLET | ORAL | 0 refills | Status: DC

## 2017-10-13 MED ORDER — AMLODIPINE BESYLATE 5 MG PO TABS: ORAL_TABLET | ORAL | 0 refills | Status: AC

## 2017-10-13 NOTE — Telephone Encounter (Signed)
Former pt of Dr. Quay Burow that has moved to [facility] requesting refill on Amlodipine and Trazodone until seen by new PCP on 10/2217.

## 2017-10-13 NOTE — Telephone Encounter (Signed)
Sent 30 day script on pt amlodipine until her appt w/new provider. The trazodone was rx by someone else pls advise if ok to send 30 day.Marland KitchenJohny Chess

## 2017-10-13 NOTE — Telephone Encounter (Signed)
Copied from [place] [phone]. Topic: Quick Communication - Rx Refill/Question >> Oct 13, 2017 11:52 AM Scherrie Gerlach wrote: Medication: amLODipine (NORVASC) 5 MG tablet                     traZODone (DESYREL) 100 MG tablet  Pt has moved to [place] and doesn't have appt with a new PCP until 10/26/2017 But does not have enough of these meds to last her through to appt.  Preferred Pharmacy (with phone number or street name): [facility] [phone]  Pt used to see Dr Quay Burow.  Last OV 08/2016

## 2017-10-13 NOTE — Telephone Encounter (Signed)
Ok to send in amlodipine

## 2017-10-13 NOTE — Telephone Encounter (Signed)
[facility] to [facility].../lmb

## 2017-10-15 ENCOUNTER — Telehealth: Payer: Self-pay | Admitting: Neurology

## 2017-10-15 DIAGNOSIS — G4700 Insomnia, unspecified: Secondary | ICD-10-CM

## 2017-10-15 DIAGNOSIS — G35 Multiple sclerosis: Secondary | ICD-10-CM

## 2017-10-15 MED ORDER — LAMOTRIGINE 100 MG PO TABS: 100.0000 mg | ORAL_TABLET | Freq: Every day | ORAL | 1 refills | Status: DC

## 2017-10-15 MED ORDER — TRAZODONE HCL 100 MG PO TABS: ORAL_TABLET | ORAL | 1 refills | Status: AC

## 2017-10-15 MED ORDER — BACLOFEN 10 MG PO TABS: ORAL_TABLET | ORAL | 3 refills | Status: AC

## 2017-10-15 NOTE — Telephone Encounter (Signed)
Pt called she is requesting refill for traZODone (DESYREL) 100 MG tablet, baclofen (LIORESAL) 10 MG tablet, lamoTRIgine (LAMICTAL) 100 MG tablet sent to [facility].

## 2017-10-15 NOTE — Telephone Encounter (Signed)
Baclofen, Lamictal, Trazodone all escribed to [facility]. Pharm as requested/fim

## 2017-11-23 ENCOUNTER — Other Ambulatory Visit: Payer: Self-pay | Admitting: Internal Medicine

## 2017-11-23 DIAGNOSIS — I10 Essential (primary) hypertension: Secondary | ICD-10-CM

## 2018-01-21 ENCOUNTER — Other Ambulatory Visit: Payer: Self-pay | Admitting: *Deleted

## 2018-01-21 DIAGNOSIS — G35 Multiple sclerosis: Secondary | ICD-10-CM

## 2018-01-21 MED ORDER — LAMOTRIGINE 100 MG PO TABS: 100.0000 mg | ORAL_TABLET | Freq: Every day | ORAL | 1 refills | Status: DC

## 2018-01-31 ENCOUNTER — Telehealth: Payer: Self-pay | Admitting: *Deleted

## 2018-01-31 DIAGNOSIS — G35 Multiple sclerosis: Secondary | ICD-10-CM

## 2018-01-31 MED ORDER — LAMOTRIGINE 100 MG PO TABS: 100.0000 mg | ORAL_TABLET | Freq: Every day | ORAL | 0 refills | Status: AC

## 2018-01-31 NOTE — Telephone Encounter (Signed)
Lamictal escribed to [facility] in response to faxed request from them/fim
